# Patient Record
Sex: Male | Born: 1935 | Race: White | Hispanic: No | Marital: Married | Smoking: Former smoker
Health system: Southern US, Community
[De-identification: ages and names within clinical notes are randomized; demographics above are authoritative.]

## PROBLEM LIST (undated history)

## (undated) DIAGNOSIS — I472 Ventricular tachycardia, unspecified: Secondary | ICD-10-CM

## (undated) DIAGNOSIS — R634 Abnormal weight loss: Secondary | ICD-10-CM

## (undated) DIAGNOSIS — I255 Ischemic cardiomyopathy: Secondary | ICD-10-CM

## (undated) DIAGNOSIS — I5022 Chronic systolic (congestive) heart failure: Secondary | ICD-10-CM

## (undated) DIAGNOSIS — I951 Orthostatic hypotension: Secondary | ICD-10-CM

## (undated) DIAGNOSIS — I38 Endocarditis, valve unspecified: Secondary | ICD-10-CM

## (undated) DIAGNOSIS — I4891 Unspecified atrial fibrillation: Secondary | ICD-10-CM

## (undated) DIAGNOSIS — J449 Chronic obstructive pulmonary disease, unspecified: Secondary | ICD-10-CM

## (undated) DIAGNOSIS — I10 Essential (primary) hypertension: Secondary | ICD-10-CM

## (undated) DIAGNOSIS — I739 Peripheral vascular disease, unspecified: Secondary | ICD-10-CM

## (undated) DIAGNOSIS — I2510 Atherosclerotic heart disease of native coronary artery without angina pectoris: Secondary | ICD-10-CM

## (undated) DIAGNOSIS — J4489 Other specified chronic obstructive pulmonary disease: Secondary | ICD-10-CM

## (undated) DIAGNOSIS — R55 Syncope and collapse: Secondary | ICD-10-CM

## (undated) DIAGNOSIS — D649 Anemia, unspecified: Secondary | ICD-10-CM

## (undated) DIAGNOSIS — I779 Disorder of arteries and arterioles, unspecified: Secondary | ICD-10-CM

## (undated) DIAGNOSIS — E785 Hyperlipidemia, unspecified: Secondary | ICD-10-CM

## (undated) DIAGNOSIS — T84498A Other mechanical complication of other internal orthopedic devices, implants and grafts, initial encounter: Secondary | ICD-10-CM

## (undated) DIAGNOSIS — I679 Cerebrovascular disease, unspecified: Secondary | ICD-10-CM

## (undated) DIAGNOSIS — J189 Pneumonia, unspecified organism: Secondary | ICD-10-CM

## (undated) HISTORY — DX: Syncope and collapse: R55

## (undated) HISTORY — DX: Hyperlipidemia, unspecified: E78.5

## (undated) HISTORY — DX: Unspecified atrial fibrillation: I48.91

## (undated) HISTORY — DX: Other mechanical complication of other internal orthopedic devices, implants and grafts, initial encounter: T84.498A

## (undated) HISTORY — DX: Other specified chronic obstructive pulmonary disease: J44.89

## (undated) HISTORY — PX: OTHER SURGICAL HISTORY: SHX169

## (undated) HISTORY — DX: Peripheral vascular disease, unspecified: I73.9

## (undated) HISTORY — DX: Orthostatic hypotension: I95.1

## (undated) HISTORY — DX: Chronic obstructive pulmonary disease, unspecified: J44.9

## (undated) HISTORY — DX: Endocarditis, valve unspecified: I38

## (undated) HISTORY — DX: Essential (primary) hypertension: I10

## (undated) HISTORY — DX: Cerebrovascular disease, unspecified: I67.9

## (undated) HISTORY — DX: Atherosclerotic heart disease of native coronary artery without angina pectoris: I25.10

## (undated) HISTORY — DX: Disorder of arteries and arterioles, unspecified: I77.9

---

## 2003-04-01 ENCOUNTER — Ambulatory Visit (HOSPITAL_COMMUNITY): Admission: RE | Admit: 2003-04-01 | Discharge: 2003-04-01 | Payer: Self-pay | Admitting: Internal Medicine

## 2005-06-26 ENCOUNTER — Ambulatory Visit: Payer: Self-pay | Admitting: Internal Medicine

## 2005-07-04 ENCOUNTER — Ambulatory Visit: Payer: Self-pay | Admitting: Internal Medicine

## 2005-07-04 ENCOUNTER — Ambulatory Visit (HOSPITAL_COMMUNITY): Admission: RE | Admit: 2005-07-04 | Discharge: 2005-07-04 | Payer: Self-pay | Admitting: Internal Medicine

## 2005-08-15 ENCOUNTER — Ambulatory Visit: Payer: Self-pay | Admitting: Internal Medicine

## 2006-03-08 ENCOUNTER — Ambulatory Visit: Payer: Self-pay | Admitting: Internal Medicine

## 2006-03-26 ENCOUNTER — Ambulatory Visit: Payer: Self-pay | Admitting: Cardiology

## 2006-04-01 ENCOUNTER — Ambulatory Visit: Payer: Self-pay | Admitting: Cardiology

## 2006-04-03 ENCOUNTER — Ambulatory Visit: Payer: Self-pay | Admitting: Cardiovascular Disease

## 2006-04-03 ENCOUNTER — Inpatient Hospital Stay (HOSPITAL_COMMUNITY): Admission: AD | Admit: 2006-04-03 | Discharge: 2006-04-09 | Payer: Self-pay | Admitting: Cardiovascular Disease

## 2006-04-03 ENCOUNTER — Inpatient Hospital Stay (HOSPITAL_BASED_OUTPATIENT_CLINIC_OR_DEPARTMENT_OTHER): Admission: RE | Admit: 2006-04-03 | Discharge: 2006-04-03 | Payer: Self-pay | Admitting: Cardiovascular Disease

## 2006-04-04 ENCOUNTER — Encounter (INDEPENDENT_AMBULATORY_CARE_PROVIDER_SITE_OTHER): Payer: Self-pay | Admitting: *Deleted

## 2006-04-04 HISTORY — PX: CORONARY ARTERY BYPASS GRAFT: SHX141

## 2006-04-29 ENCOUNTER — Ambulatory Visit: Payer: Self-pay | Admitting: Cardiology

## 2006-05-31 ENCOUNTER — Ambulatory Visit: Payer: Self-pay | Admitting: Cardiology

## 2006-06-10 ENCOUNTER — Ambulatory Visit (HOSPITAL_COMMUNITY): Admission: RE | Admit: 2006-06-10 | Discharge: 2006-06-10 | Payer: Self-pay | Admitting: Internal Medicine

## 2006-09-18 ENCOUNTER — Encounter: Payer: Self-pay | Admitting: Cardiology

## 2006-09-19 ENCOUNTER — Ambulatory Visit: Payer: Self-pay | Admitting: Cardiology

## 2007-01-02 ENCOUNTER — Ambulatory Visit: Payer: Self-pay | Admitting: Cardiology

## 2007-01-15 ENCOUNTER — Ambulatory Visit: Payer: Self-pay | Admitting: Cardiology

## 2007-01-24 ENCOUNTER — Ambulatory Visit: Payer: Self-pay | Admitting: Internal Medicine

## 2007-02-17 ENCOUNTER — Ambulatory Visit: Payer: Self-pay | Admitting: Internal Medicine

## 2007-02-17 ENCOUNTER — Encounter: Payer: Self-pay | Admitting: Cardiology

## 2007-02-17 ENCOUNTER — Encounter: Payer: Self-pay | Admitting: Internal Medicine

## 2007-02-17 ENCOUNTER — Ambulatory Visit (HOSPITAL_COMMUNITY): Admission: RE | Admit: 2007-02-17 | Discharge: 2007-02-17 | Payer: Self-pay | Admitting: Internal Medicine

## 2007-05-26 ENCOUNTER — Encounter: Payer: Self-pay | Admitting: Cardiology

## 2007-06-06 ENCOUNTER — Ambulatory Visit: Payer: Self-pay | Admitting: Cardiology

## 2007-06-11 ENCOUNTER — Ambulatory Visit: Payer: Self-pay | Admitting: Internal Medicine

## 2007-06-11 ENCOUNTER — Ambulatory Visit: Payer: Self-pay

## 2007-06-11 ENCOUNTER — Encounter: Payer: Self-pay | Admitting: Cardiology

## 2007-06-11 LAB — CONVERTED CEMR LAB
BUN: 15 mg/dL (ref 6–23)
Basophils Absolute: 0.4 10*3/uL — ABNORMAL HIGH (ref 0.0–0.1)
Basophils Relative: 5.7 % — ABNORMAL HIGH (ref 0.0–1.0)
CO2: 29 meq/L (ref 19–32)
Calcium: 9.2 mg/dL (ref 8.4–10.5)
Chloride: 107 meq/L (ref 96–112)
Creatinine, Ser: 1.1 mg/dL (ref 0.4–1.5)
Eosinophils Absolute: 0.3 10*3/uL (ref 0.0–0.6)
Eosinophils Relative: 4.3 % (ref 0.0–5.0)
GFR calc Af Amer: 84 mL/min
GFR calc non Af Amer: 69 mL/min
Glucose, Bld: 142 mg/dL — ABNORMAL HIGH (ref 70–99)
HCT: 37.4 % — ABNORMAL LOW (ref 39.0–52.0)
Hemoglobin: 13 g/dL (ref 13.0–17.0)
INR: 0.8 (ref 0.8–1.0)
Lymphocytes Relative: 30.2 % (ref 12.0–46.0)
MCHC: 34.9 g/dL (ref 30.0–36.0)
MCV: 84.8 fL (ref 78.0–100.0)
Monocytes Absolute: 0.5 10*3/uL (ref 0.2–0.7)
Monocytes Relative: 7.3 % (ref 3.0–11.0)
Neutro Abs: 4 10*3/uL (ref 1.4–7.7)
Neutrophils Relative %: 52.5 % (ref 43.0–77.0)
Platelets: 177 10*3/uL (ref 150–400)
Potassium: 3.5 meq/L (ref 3.5–5.1)
Prothrombin Time: 10.9 s (ref 10.9–13.3)
RBC: 4.41 M/uL (ref 4.22–5.81)
RDW: 13.3 % (ref 11.5–14.6)
Sodium: 143 meq/L (ref 135–145)
WBC: 7.5 10*3/uL (ref 4.5–10.5)
aPTT: 28.3 s (ref 21.7–29.8)

## 2007-06-18 ENCOUNTER — Observation Stay (HOSPITAL_COMMUNITY): Admission: AD | Admit: 2007-06-18 | Discharge: 2007-06-19 | Payer: Self-pay | Admitting: Internal Medicine

## 2007-06-18 ENCOUNTER — Ambulatory Visit: Payer: Self-pay | Admitting: Internal Medicine

## 2007-06-18 HISTORY — PX: OTHER SURGICAL HISTORY: SHX169

## 2007-06-30 ENCOUNTER — Ambulatory Visit: Payer: Self-pay

## 2007-07-07 ENCOUNTER — Ambulatory Visit (HOSPITAL_COMMUNITY): Admission: RE | Admit: 2007-07-07 | Discharge: 2007-07-07 | Payer: Self-pay | Admitting: Internal Medicine

## 2007-09-16 ENCOUNTER — Ambulatory Visit: Payer: Self-pay | Admitting: Internal Medicine

## 2007-12-25 ENCOUNTER — Ambulatory Visit: Payer: Self-pay | Admitting: Cardiovascular Disease

## 2008-03-25 ENCOUNTER — Ambulatory Visit: Payer: Self-pay | Admitting: Cardiology

## 2008-04-19 ENCOUNTER — Ambulatory Visit: Payer: Self-pay | Admitting: Cardiology

## 2008-04-23 ENCOUNTER — Encounter: Payer: Self-pay | Admitting: Cardiology

## 2008-06-09 ENCOUNTER — Ambulatory Visit: Payer: Self-pay | Admitting: Internal Medicine

## 2008-10-07 ENCOUNTER — Encounter: Payer: Self-pay | Admitting: Internal Medicine

## 2008-10-14 ENCOUNTER — Ambulatory Visit: Payer: Self-pay | Admitting: Cardiology

## 2009-01-14 ENCOUNTER — Encounter: Payer: Self-pay | Admitting: Internal Medicine

## 2009-01-14 ENCOUNTER — Ambulatory Visit: Payer: Self-pay | Admitting: Cardiology

## 2009-02-22 DIAGNOSIS — I2510 Atherosclerotic heart disease of native coronary artery without angina pectoris: Secondary | ICD-10-CM | POA: Insufficient documentation

## 2009-02-22 DIAGNOSIS — I10 Essential (primary) hypertension: Secondary | ICD-10-CM | POA: Insufficient documentation

## 2009-02-22 DIAGNOSIS — E785 Hyperlipidemia, unspecified: Secondary | ICD-10-CM | POA: Insufficient documentation

## 2009-02-22 DIAGNOSIS — I2589 Other forms of chronic ischemic heart disease: Secondary | ICD-10-CM

## 2009-04-15 ENCOUNTER — Ambulatory Visit: Payer: Self-pay

## 2009-04-15 ENCOUNTER — Encounter: Payer: Self-pay | Admitting: Internal Medicine

## 2009-05-19 ENCOUNTER — Encounter: Payer: Self-pay | Admitting: Cardiology

## 2009-06-20 ENCOUNTER — Encounter: Payer: Self-pay | Admitting: Cardiology

## 2009-06-20 ENCOUNTER — Ambulatory Visit: Payer: Self-pay | Admitting: Cardiology

## 2009-06-20 ENCOUNTER — Encounter: Payer: Self-pay | Admitting: Physician Assistant

## 2009-06-21 ENCOUNTER — Encounter: Payer: Self-pay | Admitting: Cardiology

## 2009-06-22 ENCOUNTER — Encounter: Payer: Self-pay | Admitting: Cardiology

## 2009-06-24 ENCOUNTER — Encounter: Payer: Self-pay | Admitting: Cardiology

## 2009-07-06 ENCOUNTER — Encounter: Payer: Self-pay | Admitting: Cardiology

## 2009-07-11 ENCOUNTER — Ambulatory Visit: Payer: Self-pay | Admitting: Cardiology

## 2009-07-11 DIAGNOSIS — E119 Type 2 diabetes mellitus without complications: Secondary | ICD-10-CM

## 2009-07-11 DIAGNOSIS — I679 Cerebrovascular disease, unspecified: Secondary | ICD-10-CM

## 2009-07-11 DIAGNOSIS — Z95810 Presence of automatic (implantable) cardiac defibrillator: Secondary | ICD-10-CM | POA: Insufficient documentation

## 2009-07-11 DIAGNOSIS — I509 Heart failure, unspecified: Secondary | ICD-10-CM | POA: Insufficient documentation

## 2009-08-15 ENCOUNTER — Ambulatory Visit: Payer: Self-pay | Admitting: Surgery

## 2009-08-15 ENCOUNTER — Encounter: Payer: Self-pay | Admitting: Cardiology

## 2009-08-23 ENCOUNTER — Ambulatory Visit: Payer: Self-pay | Admitting: Cardiology

## 2009-08-23 ENCOUNTER — Inpatient Hospital Stay (HOSPITAL_COMMUNITY): Admission: EM | Admit: 2009-08-23 | Discharge: 2009-08-27 | Payer: Self-pay | Admitting: Emergency Medicine

## 2009-08-23 ENCOUNTER — Ambulatory Visit: Payer: Self-pay | Admitting: Orthopedic Surgery

## 2009-08-23 ENCOUNTER — Encounter: Payer: Self-pay | Admitting: Cardiology

## 2009-08-24 ENCOUNTER — Encounter: Payer: Self-pay | Admitting: Cardiology

## 2009-08-24 ENCOUNTER — Encounter: Payer: Self-pay | Admitting: Orthopedic Surgery

## 2009-08-25 ENCOUNTER — Encounter: Payer: Self-pay | Admitting: Cardiology

## 2009-08-26 ENCOUNTER — Encounter: Payer: Self-pay | Admitting: Orthopedic Surgery

## 2009-08-28 ENCOUNTER — Encounter: Payer: Self-pay | Admitting: Orthopedic Surgery

## 2009-08-31 ENCOUNTER — Ambulatory Visit: Payer: Self-pay | Admitting: Internal Medicine

## 2009-09-01 ENCOUNTER — Telehealth: Payer: Self-pay | Admitting: Orthopedic Surgery

## 2009-09-01 ENCOUNTER — Encounter: Payer: Self-pay | Admitting: Orthopedic Surgery

## 2009-09-02 ENCOUNTER — Telehealth: Payer: Self-pay | Admitting: Orthopedic Surgery

## 2009-09-05 ENCOUNTER — Ambulatory Visit: Payer: Self-pay | Admitting: Orthopedic Surgery

## 2009-09-05 DIAGNOSIS — S82009A Unspecified fracture of unspecified patella, initial encounter for closed fracture: Secondary | ICD-10-CM

## 2009-09-07 ENCOUNTER — Encounter: Payer: Self-pay | Admitting: Orthopedic Surgery

## 2009-09-09 ENCOUNTER — Telehealth: Payer: Self-pay | Admitting: Orthopedic Surgery

## 2009-09-12 ENCOUNTER — Ambulatory Visit: Payer: Self-pay | Admitting: Orthopedic Surgery

## 2009-09-12 DIAGNOSIS — T84498A Other mechanical complication of other internal orthopedic devices, implants and grafts, initial encounter: Secondary | ICD-10-CM

## 2009-09-13 ENCOUNTER — Ambulatory Visit: Payer: Self-pay | Admitting: Cardiology

## 2009-09-13 ENCOUNTER — Telehealth: Payer: Self-pay | Admitting: Orthopedic Surgery

## 2009-09-14 ENCOUNTER — Encounter: Payer: Self-pay | Admitting: Orthopedic Surgery

## 2009-09-15 ENCOUNTER — Encounter: Payer: Self-pay | Admitting: Orthopedic Surgery

## 2009-09-16 ENCOUNTER — Inpatient Hospital Stay (HOSPITAL_COMMUNITY): Admission: RE | Admit: 2009-09-16 | Discharge: 2009-09-19 | Payer: Self-pay | Admitting: Orthopedic Surgery

## 2009-09-16 ENCOUNTER — Ambulatory Visit: Payer: Self-pay | Admitting: Orthopedic Surgery

## 2009-09-19 ENCOUNTER — Encounter: Payer: Self-pay | Admitting: Orthopedic Surgery

## 2009-09-21 ENCOUNTER — Telehealth: Payer: Self-pay | Admitting: Orthopedic Surgery

## 2009-09-26 ENCOUNTER — Ambulatory Visit: Payer: Self-pay | Admitting: Orthopedic Surgery

## 2009-10-06 ENCOUNTER — Ambulatory Visit: Payer: Self-pay | Admitting: Orthopedic Surgery

## 2009-10-06 ENCOUNTER — Telehealth: Payer: Self-pay | Admitting: Orthopedic Surgery

## 2009-10-07 ENCOUNTER — Encounter: Payer: Self-pay | Admitting: Orthopedic Surgery

## 2009-10-10 ENCOUNTER — Encounter: Payer: Self-pay | Admitting: Orthopedic Surgery

## 2009-11-02 ENCOUNTER — Ambulatory Visit: Payer: Self-pay | Admitting: Orthopedic Surgery

## 2009-11-16 ENCOUNTER — Encounter: Payer: Self-pay | Admitting: Orthopedic Surgery

## 2009-11-16 ENCOUNTER — Telehealth: Payer: Self-pay | Admitting: Orthopedic Surgery

## 2009-11-17 ENCOUNTER — Telehealth (INDEPENDENT_AMBULATORY_CARE_PROVIDER_SITE_OTHER): Payer: Self-pay | Admitting: *Deleted

## 2009-11-23 ENCOUNTER — Encounter: Payer: Self-pay | Admitting: Orthopedic Surgery

## 2009-11-23 ENCOUNTER — Encounter (HOSPITAL_COMMUNITY): Admission: RE | Admit: 2009-11-23 | Discharge: 2009-12-23 | Payer: Self-pay | Admitting: Orthopedic Surgery

## 2009-11-30 ENCOUNTER — Ambulatory Visit: Payer: Self-pay | Admitting: Cardiology

## 2009-11-30 ENCOUNTER — Encounter: Payer: Self-pay | Admitting: Internal Medicine

## 2009-12-14 ENCOUNTER — Ambulatory Visit: Payer: Self-pay | Admitting: Orthopedic Surgery

## 2010-02-14 ENCOUNTER — Ambulatory Visit: Payer: Self-pay | Admitting: Surgery

## 2010-02-21 ENCOUNTER — Ambulatory Visit: Payer: Self-pay | Admitting: Cardiology

## 2010-02-21 ENCOUNTER — Encounter: Payer: Self-pay | Admitting: Internal Medicine

## 2010-02-28 ENCOUNTER — Ambulatory Visit: Payer: Self-pay | Admitting: Cardiology

## 2010-02-28 ENCOUNTER — Encounter: Payer: Self-pay | Admitting: Cardiology

## 2010-02-28 ENCOUNTER — Inpatient Hospital Stay (HOSPITAL_COMMUNITY): Admission: AD | Admit: 2010-02-28 | Discharge: 2010-03-03 | Payer: Self-pay | Admitting: Cardiology

## 2010-03-02 ENCOUNTER — Encounter: Payer: Self-pay | Admitting: Cardiology

## 2010-03-03 ENCOUNTER — Encounter: Payer: Self-pay | Admitting: Cardiology

## 2010-03-07 ENCOUNTER — Encounter: Payer: Self-pay | Admitting: Cardiology

## 2010-03-09 ENCOUNTER — Ambulatory Visit: Payer: Self-pay | Admitting: Cardiology

## 2010-03-09 ENCOUNTER — Encounter: Payer: Self-pay | Admitting: Cardiology

## 2010-03-15 ENCOUNTER — Ambulatory Visit: Payer: Self-pay | Admitting: Physician Assistant

## 2010-03-15 DIAGNOSIS — R0989 Other specified symptoms and signs involving the circulatory and respiratory systems: Secondary | ICD-10-CM

## 2010-03-15 DIAGNOSIS — I5022 Chronic systolic (congestive) heart failure: Secondary | ICD-10-CM

## 2010-03-17 ENCOUNTER — Encounter: Payer: Self-pay | Admitting: Physician Assistant

## 2010-03-21 ENCOUNTER — Encounter: Payer: Self-pay | Admitting: Cardiology

## 2010-03-29 ENCOUNTER — Encounter (INDEPENDENT_AMBULATORY_CARE_PROVIDER_SITE_OTHER): Payer: Self-pay | Admitting: *Deleted

## 2010-04-05 ENCOUNTER — Encounter: Payer: Self-pay | Admitting: Cardiology

## 2010-04-05 ENCOUNTER — Ambulatory Visit: Payer: Self-pay | Admitting: Cardiovascular Disease

## 2010-04-05 ENCOUNTER — Ambulatory Visit (HOSPITAL_COMMUNITY): Admission: RE | Admit: 2010-04-05 | Discharge: 2010-04-05 | Payer: Self-pay | Admitting: Cardiology

## 2010-04-07 ENCOUNTER — Encounter: Payer: Self-pay | Admitting: Internal Medicine

## 2010-04-07 ENCOUNTER — Ambulatory Visit: Payer: Self-pay | Admitting: Internal Medicine

## 2010-04-07 DIAGNOSIS — I472 Ventricular tachycardia: Secondary | ICD-10-CM

## 2010-04-27 ENCOUNTER — Ambulatory Visit: Payer: Self-pay | Admitting: Cardiology

## 2010-04-27 DIAGNOSIS — R5383 Other fatigue: Secondary | ICD-10-CM

## 2010-04-27 DIAGNOSIS — R5381 Other malaise: Secondary | ICD-10-CM

## 2010-05-14 ENCOUNTER — Ambulatory Visit: Payer: Self-pay | Admitting: Internal Medicine

## 2010-05-14 ENCOUNTER — Encounter: Payer: Self-pay | Admitting: Cardiology

## 2010-05-14 ENCOUNTER — Encounter (INDEPENDENT_AMBULATORY_CARE_PROVIDER_SITE_OTHER): Payer: Self-pay | Admitting: Critical Care Medicine

## 2010-05-15 ENCOUNTER — Telehealth (INDEPENDENT_AMBULATORY_CARE_PROVIDER_SITE_OTHER): Payer: Self-pay | Admitting: *Deleted

## 2010-05-17 ENCOUNTER — Ambulatory Visit: Payer: Self-pay | Admitting: Cardiology

## 2010-05-22 ENCOUNTER — Telehealth (INDEPENDENT_AMBULATORY_CARE_PROVIDER_SITE_OTHER): Payer: Self-pay | Admitting: *Deleted

## 2010-05-31 ENCOUNTER — Telehealth (INDEPENDENT_AMBULATORY_CARE_PROVIDER_SITE_OTHER): Payer: Self-pay | Admitting: *Deleted

## 2010-06-07 ENCOUNTER — Ambulatory Visit: Payer: Self-pay | Admitting: Physician Assistant

## 2010-06-09 ENCOUNTER — Encounter: Payer: Self-pay | Admitting: Physician Assistant

## 2010-06-09 ENCOUNTER — Ambulatory Visit: Payer: Self-pay | Admitting: Cardiology

## 2010-06-14 ENCOUNTER — Encounter (INDEPENDENT_AMBULATORY_CARE_PROVIDER_SITE_OTHER): Payer: Self-pay | Admitting: *Deleted

## 2010-06-19 ENCOUNTER — Telehealth: Payer: Self-pay | Admitting: Internal Medicine

## 2010-06-23 ENCOUNTER — Ambulatory Visit: Payer: Self-pay | Admitting: Internal Medicine

## 2010-08-14 ENCOUNTER — Ambulatory Visit
Admission: RE | Admit: 2010-08-14 | Discharge: 2010-08-14 | Payer: Self-pay | Source: Home / Self Care | Attending: Surgery | Admitting: Surgery

## 2010-08-28 ENCOUNTER — Encounter: Payer: Self-pay | Admitting: Internal Medicine

## 2010-08-28 ENCOUNTER — Ambulatory Visit
Admission: RE | Admit: 2010-08-28 | Discharge: 2010-08-28 | Payer: Self-pay | Source: Home / Self Care | Attending: Internal Medicine | Admitting: Internal Medicine

## 2010-09-05 NOTE — Procedures (Signed)
Summary: 3 mth f/u per checkout on 08/31/09/tg   Current Medications (verified): 1)  Simvastatin 40 Mg Tabs (Simvastatin) .... One By Mouth Daily 2)  Aspirin 325 Mg Tabs (Aspirin) .... One By Mouth Daily 3)  Diovan 80 Mg Tabs (Valsartan) .... 1/2 By Mouth Two Times A Day 4)  Carvedilol 6.25 Mg Tabs (Carvedilol) .... Take 1 Tablet By Mouth Two Times A Day 5)  Furosemide 40 Mg Tabs (Furosemide) .... Take One Tablet By Mouth Two Times A Day 6)  Ventolin Hfa 108 (90 Base) Mcg/act Aers (Albuterol Sulfate) .... 2 Puffs Every 6 Hours 7)  Glipizide Xl 2.5 Mg Xr24h-Tab (Glipizide) .... One By Mouth Daily 8)  Potassium Chloride Cr 10 Meq Cr-Caps (Potassium Chloride) .... Take 1 Tablet By Mouth Once A Day 9)  Loratadine 10 Mg Tabs (Loratadine) .... Take 1 Tablet By Mouth Once A Day 10)  Ra Acid Reducer Max St 150 Mg Tabs (Ranitidine Hcl) .... Take 1 Tablet By Mouth Once A Day 11)  Nitrostat 0.4 Mg Subl (Nitroglycerin) .... Use As Directed  Allergies (verified): No Known Drug Allergies    ICD Specifications Following MD:  Lewayne Bunting, MD     ICD Vendor:  St Jude     ICD Model Number:  [id number]     ICD Serial Number:  [id number] ICD DOI:  06/18/2007      Lead 1:    Location: RA     DOI: 06/18/2007     Model #: [id number]     Serial #: [id number]     Status: active Lead 2:    Location: RV     DOI: 06/18/2007     Model #: [id number]     Serial #: [id number]     Status: active  Indications::  ICM   ICD Follow Up Remote Check?  No Battery Voltage:  3.13 V     Charge Time:  10.8 seconds     Battery Est. Longevity:  5 years Underlying rhythm:  SR@68  ICD Dependent:  No       ICD Device Measurements Atrium:  Amplitude: 3.7 mV, Impedance: 360 ohms, Threshold: 1.0 V at 0.5 msec Right Ventricle:  Amplitude: 11.7 mV, Impedance: 450 ohms, Threshold: 1.0 V at 0.5 msec Shock Impedance: 54 ohms   Episodes MS Episodes:  5     Percent Mode Switch:  <1%     Coumadin:  No Shock:  1     ATP:  3     Nonsustained:  0     ICD  Appropriate Therapy?  Yes  Brady Parameters Mode DDDR     Lower Rate Limit:  60     Upper Rate Limit 105 PAV 275     Sensed AV Delay:  275  Tachy Zones VF:  240     VT:  200     VT1:  176     Next Cardiology Appt Due:  02/03/2010 Tech Comments:  No parameter changes.  Device function normal.  Rate response somewhat blunted but adequate for the patient's level of activity.  Mr.Eppes fell on the ice during the winter and required knee surgery.  He is now taking PT but continues to use a brace and cane.  He also vaguely remembers being shocked back in March.  ATP therapy x2 unsuccessful.  ROV 3 months in the [facility] clinic. Altha Harm, LPN  November 30, 2009 10:42 AM  MD Comments:  Agree with above.

## 2010-09-05 NOTE — Cardiovascular Report (Signed)
Summary: Office Visit   Office Visit   Imported By: Roderic Ovens 06/26/2010 15:52:28  _____________________________________________________________________  External Attachment:    Type:   Image     Comment:   External Document

## 2010-09-05 NOTE — Letter (Signed)
Summary: Revised Out of Work Note for [facility]  [address]  [place], [place] [address]   Phone: [phone]  Fax: [phone]    September 14, 2009   Employee:  Emmit Pomfret Patient:     [name]    To Whom It May Concern:   For Medical reasons, please excuse the above named employee from work for the following dates:  Start:   Friday, 09/09/2009  End:   Friday, 11/18/2009  Per FMLA paperwork, employee may be excused from work for hospital and office appointments.   If you need additional information, please feel free to contact our office.         Sincerely,    Terrance Mass, MD

## 2010-09-05 NOTE — Progress Notes (Signed)
Summary: wait on PT?  Phone Note Other Incoming   Summary of Call: [facility] says [name] (07/09/1929) says he is not supposed to start PT for 2 weeks.  We do not have discharge summary. Stacy's # [phone]  ok to leave message. Initial call taken by: Jacklynn Ganong,  September 21, 2009 9:39 AM  Follow-up for Phone Call        physical therapy for gait training only no knee motion exercises no strengthening exercises for the knee Follow-up by: Ether Griffins,  September 21, 2009 9:46 AM  Additional Follow-up for Phone Call Additional follow up Details #1::        Left a message on Stacy's phone to wait on pt Additional Follow-up by: Jacklynn Ganong,  September 21, 2009 10:56 AM

## 2010-09-05 NOTE — Cardiovascular Report (Signed)
Summary: Office Visit   Office Visit   Imported By: Roderic Ovens 03/07/2010 15:50:22  _____________________________________________________________________  External Attachment:    Type:   Image     Comment:   External Document

## 2010-09-05 NOTE — Progress Notes (Signed)
Summary: can patient do home exercises or OP therapy  Phone Note Call from Patient Call back at Home Phone [phone]   Summary of Call: says he cannot do the home therapy due to insurance,  will not cover due to patient not being home bound, can patient drive himself to therapy or are there any exercises that we can give him to do himself!? Initial call taken by: Ether Griffins,  November 16, 2009 3:59 PM  Follow-up for Phone Call        ok to drive to PT  order out patient PT Follow-up by: Fuller Canada MD,  November 17, 2009 8:27 AM  Additional Follow-up for Phone Call Additional follow up Details #1::        ok faxed order to [facility] and advised patient Additional Follow-up by: Ether Griffins,  November 17, 2009 9:40 AM

## 2010-09-05 NOTE — Miscellaneous (Signed)
Summary: Orders Update  Clinical Lists Changes  Orders: Added new Referral order of 2-D Echocardiogram (2D Echo) - Signed 

## 2010-09-05 NOTE — Progress Notes (Signed)
Summary: [facility] to [facility] to d/c  ---- Converted from flag ---- ---- 10/06/2009 11:28 AM, Fuller Canada MD wrote: call [facility] stop nurse from coming to the house ------------------------------  Phone Note Outgoing Call   Call placed to: Home health Summary of Call: [facility], per Rayle, [phone].  Asked about physical therapy - discontinue also? Initial call taken by: Cammie Sickle,  October 06, 2009 11:48 AM

## 2010-09-05 NOTE — Assessment & Plan Note (Signed)
Summary: d/c amiodarone, restart sotalol per gd  --agh   Visit Type:  Follow-up Primary Provider:  Dr. Wyvonnia Lora   History of Present Illness: patient is seen today as an add-on.  He was recently seen here in the clinic, 10/12, by Dr. Andee Lineman, following recent hospitalization here at [facility] for recurrent ICD shock. He was taken off sotalol, and started on amiodarone load. Dr. Andee Lineman noted that he had had an appropriate shock from his ICD.  Shortly after this, the patient contacted our office complaining of lower extremity weakness and increased dyspnea. He felt that this was due to the amiodarone, and dose was decreased to 200 mg daily, per Dr. Andee Lineman. However, he continued to experience symptoms, and the medication was ultimately discontinued this past Monday, 10/31. He was advised to return to the clinic today to discuss resumption of sotalol.  Clinically, the patient notes improvement in his symptoms, since stopping the medication. He is no longer short of breath, and his gait and mobility has improved.  Preventive Screening-Counseling & Management  Alcohol-Tobacco     Smoking Status: quit     Year Quit: 1992  Current Medications (verified): 1)  Simvastatin 40 Mg Tabs (Simvastatin) .... One By Mouth Daily 2)  Aspirin 325 Mg Tabs (Aspirin) .... Take 1 Tablet By Mouth Once A Day 3)  Diovan 80 Mg Tabs (Valsartan) .... 1/2 By Mouth Daily 4)  Furosemide 40 Mg Tabs (Furosemide) .... Take 1 Tablet By Mouth Once A Day 5)  Ventolin Hfa 108 (90 Base) Mcg/act Aers (Albuterol Sulfate) .... 2 Puffs Every 6 Hours 6)  Glipizide Xl 2.5 Mg Xr24h-Tab (Glipizide) .... One By Mouth Daily 7)  Potassium Chloride Cr 10 Meq Cr-Caps (Potassium Chloride) .... Take 1 Tablet By Mouth Once A Day 8)  Nitrostat 0.4 Mg Subl (Nitroglycerin) .... Use As Directed 9)  Prilosec Otc 20 Mg Tbec (Omeprazole Magnesium) .... One By Mouth Daily 10)  Zyrtec Allergy 10 Mg Tabs (Cetirizine Hcl) .... Take 1 Tablet By Mouth  Twice A Day 11)  Metoprolol Succinate 25 Mg Xr24h-Tab (Metoprolol Succinate) .... Take One Tablet By Mouth Daily  Allergies (verified): No Known Drug Allergies  Comments:  Nurse/Medical Assistant: The patient's medication list and allergies were reviewed with the patient and were updated in the Medication and Allergy Lists.  Past History:  Past Medical History: Last updated: 05/17/2010 Current Problems:  ISCHEMIC CARDIOMYOPATHY (ICD-414.8) Cardiac catheterization July 20113/3 grafts patent. CAD (ICD-414.00) DYSLIPIDEMIA (ICD-272.4) HYPERTENSION (ICD-401.9) Diabetes mellitus COPD History of ICD  sotalol discontinued and started on amiodarone Carotid artery disease... status post left CEA Valvular heart disease 2+ mitral regurgitation Chronic systolic heart failure ejection fraction 45-50% August 2011 History of postop atrial fibrillation History of LV thrombus resolved  Review of Systems       No fevers, chills, hemoptysis, dysphagia, melena, hematocheezia, hematuria, rash, claudication, orthopnea, pnd, pedal edema. All other systems negative.   Vital Signs:  Patient profile:   77 year old male Height:      69 inches Weight:      170 pounds Pulse rate:   60 / minute BP sitting:   134 / 79  (left arm) Cuff size:   regular  Vitals Entered By: Carlye Grippe (June 07, 2010 10:31 AM)  Physical Exam  Additional Exam:  GEN: 77 year old male, sitting upright, no distress HEENT: NCAT,PERRLA,EOMI NECK: palpable pulses, no bruits; no JVD; no TM LUNGS: CTA bilaterally HEART: RRR (S1S2); no significant murmurs; no rubs; no gallops ABD: soft,  NT; intact BS EXT: intact distal pulses; no edema SKIN: warm, dry MUSC: no obvious deformity NEURO: A/O (x3)     EKG  Procedure date:  06/07/2010  Findings:      atrial pacing at 60 bpm with first degree AV block   ICD Specifications Following MD:  Lewayne Bunting, MD     ICD Vendor:  St Jude     ICD Model Number:   [id number]     ICD Serial Number:  [id number] ICD DOI:  06/18/2007      Lead 1:    Location: RA     DOI: 06/18/2007     Model #: [id number]     Serial #: [id number]     Status: active Lead 2:    Location: RV     DOI: 06/18/2007     Model #: [id number]     Serial #: [id number]     Status: active  Indications::  ICM   ICD Follow Up ICD Dependent:  No      Episodes Coumadin:  No  Brady Parameters Mode DDDR     Lower Rate Limit:  60     Upper Rate Limit 100 PAV 275     Sensed AV Delay:  275  Tachy Zones VF:  240     VT:  176     VT1:  176     Impression & Recommendations:  Problem # 1:  VENTRICULAR TACHYCARDIA (ICD-427.1)  following discussion with Dr. Andee Lineman, recommendation is to resume sotalol at previous dose of 80 mg b.i.d. Patient has a scheduled appointment with Dr. Johney Frame next month, at which time he can discuss feasibility of possible VT ablation. We will check to make sure that he has a 2-D echo prior to that visit, as previously recommended.  Problem # 2:  CHRONIC SYSTOLIC HEART FAILURE (ICD-428.22)  euvolemic by history and physical examination.  Orders: 2-D Echocardiogram (2D Echo)  Problem # 3:  ISCHEMIC CARDIOMYOPATHY (ICD-414.8)  quiesced and on current medication regimen.  Orders: 2-D Echocardiogram (2D Echo)  Other Orders: EKG w/ Interpretation (93000)  Patient Instructions: 1)  Your physician wants you to follow-up in: 4 months. You will receive a reminder letter in the mail one-two months in advance. If you don't receive a letter, please call our office to schedule the follow-up appointment. 2)  Stop Amiodarone and metoprolol (Toprol XL). 3)  Resume Sotalol 80mg  by mouth two times a day.  4)  Your physician has requested that you have an echocardiogram.  Echocardiography is a painless test that uses sound waves to create images of your heart. It provides your doctor with information about the size and shape of your heart and how well your heart's chambers and valves are  working.  This procedure takes approximately one hour. There are no restrictions for this procedure. If the results of your test are normal or stable, you will receive a letter. If they are abnormal, the nurse will contact you by phone.  5)  APPOINTMENT WITH DR. ALLRED ON MONDAY, August 28, 2009 AT 2PM. Prescriptions: SOTALOL HCL 80 MG TABS (SOTALOL HCL) Take one tablet by mouth twice a day  #60 x 3   Entered by:   Cyril Loosen, RN, BSN   Authorized by:   Nelida Meuse, PA-C   Signed by:   Cyril Loosen, RN, BSN on 06/07/2010   Method used:   Electronically to        [facility]* (retail)  [address]       [place], [place]  [address]       Ph: [phone]       Fax: [phone]   RxID:   [id number]   Handout requested.  I have reviewed and approved all prescriptions at the time of the office visit. Nelida Meuse, PA-C  June 07, 2010 11:27 AM

## 2010-09-05 NOTE — Miscellaneous (Signed)
Summary: Home Care Report  Home Care Report   Imported By: Elvera Maria 09/28/2009 13:35:50  _____________________________________________________________________  External Attachment:    Type:   Image     Comment:   home health

## 2010-09-05 NOTE — Assessment & Plan Note (Signed)
Summary: [facility]   Visit Type:  Follow-up Primary Provider:  Dr. Wyvonnia Lora   History of Present Illness: patient presents for post hospital followup.  He was recently briefly hospitalized at [facility], after presenting with ICD shock. Device interrogation yielded MMVT, successfully converted 2 normal sinus rhythm after one shock. Our electrophysiology team was consulted, and recommended adding sotalol 80 mg b.i.d. QT interval remained stable, and patient was discharged on hospital day #3.  Ischemic workup also ensued, following minimal troponin elevation of 0.09, with normal CPK/MBs. Cardiac catheterization indicated severe multivessel CAD with patent bypass grafts; EF 30-35% with 2+ mitral regurgitation.  A 2D echocardiogram was also obtained, indicating EF 25-30% with apical akinesis and question of possible apical thrombus. There was moderate MR. Of note, recommendation was for an outpatient followup 2-D echo with Definity, for further clarification possible thrombus.  A 2-D echo was done here at [facility], on August 4, and reviewed by Dr. Jens Som, indicating EF 30-35%, with mild MR, mild AS, and mild AR. Of note, there was no suggestion of LV thrombus; however, it is not clear that this was performed with Definity contrast.  Clinically, the patient denies any chest pain, dyspnea, tachycardia palpitations, or presyncope/syncope. He denies any complications of the right groin incision site.  Preventive Screening-Counseling & Management  Alcohol-Tobacco     Smoking Status: quit     Year Quit: 1992  Current Medications (verified): 1)  Simvastatin 40 Mg Tabs (Simvastatin) .... One By Mouth Daily 2)  Aspirin 325 Mg Tabs (Aspirin) .... One By Mouth Daily 3)  Diovan 80 Mg Tabs (Valsartan) .... 1/2 By Mouth Daily 4)  Carvedilol 6.25 Mg Tabs (Carvedilol) .... Take 1 Tablet By Mouth Two Times A Day 5)  Furosemide 40 Mg Tabs (Furosemide) .... Take 1 Tablet By Mouth Once A  Day 6)  Ventolin Hfa 108 (90 Base) Mcg/act Aers (Albuterol Sulfate) .... 2 Puffs Every 6 Hours 7)  Glipizide Xl 2.5 Mg Xr24h-Tab (Glipizide) .... One By Mouth Daily 8)  Potassium Chloride Cr 10 Meq Cr-Caps (Potassium Chloride) .... Take 1 Tablet By Mouth Once A Day 9)  Loratadine 10 Mg Tabs (Loratadine) .... Take 1 Tablet By Mouth Once A Day 10)  Nitrostat 0.4 Mg Subl (Nitroglycerin) .... Use As Directed 11)  Prilosec Otc 20 Mg Tbec (Omeprazole Magnesium) .... One By Mouth Daily 12)  Sotalol Hcl 160 Mg Tabs (Sotalol Hcl) .... Take 1/2 Tablet By Mouth Two Times A Day  Allergies (verified): No Known Drug Allergies  Comments:  Nurse/Medical Assistant: The patient's medication bottles and allergies were reviewed with the patient and were updated in the Medication and Allergy Lists.  Past History:  Past Medical History: Current Problems:  ISCHEMIC CARDIOMYOPATHY (ICD-414.8) CAD (ICD-414.00) DYSLIPIDEMIA (ICD-272.4) HYPERTENSION (ICD-401.9) Diabetes mellitus COPD History of ICD Carotid artery disease... status post left CEA Valvular heart disease Chronic systolic heart failure History of postop atrial fibrillation  Review of Systems       No fevers, chills, hemoptysis, dysphagia, melena, hematocheezia, hematuria, rash, claudication, orthopnea, pnd, pedal edema. All other systems negative.   Vital Signs:  Patient profile:   77 year old male Height:      69 inches Weight:      163 pounds Pulse rate:   80 / minute BP sitting:   116 / 78  (left arm) Cuff size:   regular  Vitals Entered By: Carlye Grippe (March 15, 2010 2:21 PM)  Physical Exam  Additional Exam:  GEN: 77 year old  male, in no distress HEENT: NCAT,PERRLA,EOMI NECK: palpable pulses, bilateral bruits; left CEA scar; no JVD; no TM LUNGS: CTA bilaterally HEART: RRR (S1S2); no significant murmurs; no rubs; no gallops ABD: soft, NT; intact BS EXT: palpable right femoral pulse with no hematoma or ecchymosis;  soft, bilateral femoral bruits; palpable distal pulses with no edema SKIN: warm, dry MUSC: no obvious deformity NEURO: A/O (x3)     EKG  Procedure date:  03/15/2010  Findings:      normal sinus rhythm with first degree AV block at 60 bpm; normal axis; prior inferior infarct with no acute changes; QTC 0.486   ICD Specifications Following MD:  Lewayne Bunting, MD     ICD Vendor:  St Jude     ICD Model Number:  [phone]     ICD Serial Number:  [id number] ICD DOI:  06/18/2007      Lead 1:    Location: RA     DOI: 06/18/2007     Model #: 1699TC     Serial #: [id number]     Status: active Lead 2:    Location: RV     DOI: 06/18/2007     Model #: 6160     Serial #: [id number]     Status: active  Indications::  ICM   ICD Follow Up ICD Dependent:  No      Episodes Coumadin:  No  Brady Parameters Mode DDDR     Lower Rate Limit:  60     Upper Rate Limit 105 PAV 275     Sensed AV Delay:  275  Tachy Zones VF:  240     VT:  200     VT1:  176     Impression & Recommendations:  Problem # 1:  IMPLANTATION OF DEFIBRILLATOR, HX OF (ICD-V45.02)  patient presents status post recent defibrillator shock, with no recurrent discharges. He was started on sotalol 80 mg b.i.d., and QT interval remains stable. However, he is also still on carvedilol, which I will discontinue. We'll also decrease aspirin to 81 mg daily. Of note, we'll check followup metabolic profile for reassessment of hypokalemia; discharge level 3.4. Plan clinic follow up with Dr. Andee Lineman in 3 months.  Problem # 2:  ISCHEMIC CARDIOMYOPATHY (ICD-414.8)  patient had followup outpatient 2-D echocardiogram to exclude possible apical thrombus. However, it is not clear that this study was done with Definity contrast for this purpose. Therefore, I will repeat this study, with Definity, to better exclude the possibility of apical thrombus. Ejection fraction was assessed at 30-35%, which was stable.  Problem # 3:  DYSLIPIDEMIA (ICD-272.4) Assessment:  Comment Only  Problem # 4:  CAROTID BRUIT (ICD-785.9)  patient has documented carotid artery disease, status post left CEA, and is followed by the vascular surgery team in [place], with periodic carotid Dopplers.  Problem # 5:  CHRONIC SYSTOLIC HEART FAILURE (ICD-428.22)  patient remains euvolemic, on current diuretic regimen. We'll check followup blood work.  Other Orders: EKG w/ Interpretation (93000) T-Basic Metabolic Panel (94854-62703) 2-D Echocardiogram (2D Echo)  Patient Instructions: 1)  Your physician wants you to follow-up in: 3 months. You will receive a reminder letter in the mail one-two months in advance. If you don't receive a letter, please call our office to schedule the follow-up appointment. 2)  Stop Carvedilol.  3)  Decrease Aspirin to 81mg  by mouth once daily. 4)  Your physician has requested that you have an echocardiogram.  Echocardiography is a painless test that uses sound waves  to create images of your heart. It provides your doctor with information about the size and shape of your heart and how well your heart's chambers and valves are working.  This procedure takes approximately one hour. There are no restrictions for this procedure. If the results of your test are normal or stable, you will receive a letter. If they are abnormal, the nurse will contact you by phone.  5)  Your physician recommends that you go to the [facility] for lab work.

## 2010-09-05 NOTE — Assessment & Plan Note (Signed)
Summary: EPH-PER C [facility] PER ALLRED   Visit Type:  Pacemaker check   Preventive Screening-Counseling & Management  Alcohol-Tobacco     Smoking Status: quit     Year Quit: 1990  Current Medications (verified): 1)  Simvastatin 40 Mg Tabs (Simvastatin) .... One By Mouth Daily 2)  Aspirin 81 Mg Tbec (Aspirin) .... Take One Tablet By Mouth Daily 3)  Diovan 80 Mg Tabs (Valsartan) .... 1/2 By Mouth Daily 4)  Furosemide 40 Mg Tabs (Furosemide) .... Take 1 Tablet By Mouth Once A Day 5)  Ventolin Hfa 108 (90 Base) Mcg/act Aers (Albuterol Sulfate) .... 2 Puffs Every 6 Hours 6)  Glipizide Xl 2.5 Mg Xr24h-Tab (Glipizide) .... One By Mouth Daily 7)  Potassium Chloride Cr 10 Meq Cr-Caps (Potassium Chloride) .... Take 1 Tablet By Mouth Once A Day 8)  Loratadine 10 Mg Tabs (Loratadine) .... Take 1 Tablet By Mouth Two Times A Day 9)  Nitrostat 0.4 Mg Subl (Nitroglycerin) .... Use As Directed 10)  Prilosec Otc 20 Mg Tbec (Omeprazole Magnesium) .... One By Mouth Daily 11)  Sotalol Hcl 160 Mg Tabs (Sotalol Hcl) .... Take 1/2 Tablet By Mouth Two Times A Day  Allergies (verified): No Known Drug Allergies  Comments:  Nurse/Medical Assistant: The patient's medication list and allergies were reviewed with the patient and were updated in the Medication and Allergy Lists.  Vital Signs:  Patient profile:   77 year old male Height:      69 inches Weight:      165 pounds O2 Sat:      97 % on Room air Pulse rate:   63 / minute BP sitting:   128 / 71  (left arm) Cuff size:   regular  Vitals Entered By: Carlye Grippe (April 07, 2010 2:44 PM)  O2 Flow:  Room air    ICD Specifications Following MD:  Lewayne Bunting, MD     ICD Vendor:  [facility] Jude     ICD Model Number:  [phone]     ICD Serial Number:  [id number] ICD DOI:  06/18/2007      Lead 1:    Location: RA     DOI: 06/18/2007     Model #: 1699TC     Serial #: [id number]     Status: active Lead 2:    Location: RV     DOI: 06/18/2007     Model #:  5638     Serial #: [id number]     Status: active  Indications::  ICM   ICD Follow Up ICD Dependent:  No      Episodes Coumadin:  No  Brady Parameters Mode DDDR     Lower Rate Limit:  60     Upper Rate Limit 105 PAV 275     Sensed AV Delay:  275  Tachy Zones VF:  240     VT:  200     VT1:  176

## 2010-09-05 NOTE — Letter (Signed)
Summary: Out of Work  [facility]  [address]  [place], [place] [address]   Phone: [phone]  Fax: [phone]    September 12, 2009   Employee: Emmit Pomfret   PATIENT: [name]    To Whom It May Concern:   For Medical reasons, please excuse the above named employee from work for the following dates:  Start:   Friday, 09/09/2008  Per FMLA paperwork, employee may be excused from work for hospital and office appointments.   If you need additional information, please feel free to contact our office.         Sincerely,    Terrance Mass, MD

## 2010-09-05 NOTE — Progress Notes (Signed)
Summary: New Medication question  Phone Note Call from Patient Call back at Home Phone [phone]   Caller: Patient Reason for Call: Talk to Nurse Summary of Call: Was put on a new heart medication this weekend while in [facility].   Amodiarone 400mg  3X a day for 7 days. He wants to know if he should me taking this much and that often Initial call taken by: Claudette Laws,  May 15, 2010 10:22 AM  Follow-up for Phone Call        Notified to pt that this dose is correct.  Will f/u with OV on Wednesday morning.  Patient verbalized understanding.  Follow-up by: Hoover Brunette, LPN,  May 15, 2010 3:37 PM

## 2010-09-05 NOTE — Assessment & Plan Note (Signed)
Summary: eph-mmh per gene this week appt   Visit Type:  Follow-up Primary Provider:  Dr. Wyvonnia Lora   History of Present Illness: the patient is a 77 year old male with history of ischemic cardiomyopathy, status post coronary bypass grafting and status post ICD implantation. The patient had a hospitalization in July of 2011 at [facility] status post ICD shock and was started on sotalol. His most recent ejection fraction actually has improved to 45-50% with resolution of the prior LV thrombus. The patient however had to be readmitted to [facility] this weekend because of recurrent ICD shock. Reportedly  the patient woke up in the middle of the night and was sitting at the bedside. He felt a little bit short of breath he then he  suddenly passed out and his wife saw that he was shaking, but this lasted only a few seconds and immediately afterwards the patient had regained normal consciousness. There were no focal neurological deficits. He was seen by the [facility] cardiology fellow, The ICD  was interrogated and he had an appropriate ICD countershock despite the use of sotalol. Sotalol was discontinued and amiodarone was started. The patient has done well since hospital discharge and is still being loaded on amiodarone. He reports no side effects like nausea or vomiting. He has had no recurrent chest pain has had no change in his exercise tolerance and no palpitations.  Preventive Screening-Counseling & Management  Alcohol-Tobacco     Smoking Status: quit     Year Quit: 1992  Current Medications (verified): 1)  Simvastatin 40 Mg Tabs (Simvastatin) .... One By Mouth Daily 2)  Aspirin 325 Mg Tabs (Aspirin) .... Take 1 Tablet By Mouth Once A Day 3)  Diovan 80 Mg Tabs (Valsartan) .... 1/2 By Mouth Daily 4)  Furosemide 40 Mg Tabs (Furosemide) .... Take 1 Tablet By Mouth Once A Day 5)  Ventolin Hfa 108 (90 Base) Mcg/act Aers (Albuterol Sulfate) .... 2 Puffs Every 6 Hours 6)  Glipizide Xl  2.5 Mg Xr24h-Tab (Glipizide) .... One By Mouth Daily 7)  Potassium Chloride Cr 10 Meq Cr-Caps (Potassium Chloride) .... Take 1 Tablet By Mouth Once A Day 8)  Nitrostat 0.4 Mg Subl (Nitroglycerin) .... Use As Directed 9)  Prilosec Otc 20 Mg Tbec (Omeprazole Magnesium) .... One By Mouth Daily 10)  Zyrtec Allergy 10 Mg Tabs (Cetirizine Hcl) .... Take 1 Tablet By Mouth Once A Day 11)  Metoprolol Succinate 25 Mg Xr24h-Tab (Metoprolol Succinate) .... Take One Tablet By Mouth Daily 12)  Amiodarone Hcl 400 Mg Tabs (Amiodarone Hcl) .... Take 1 Tablet By Mouth Two Times A Day X 2 Weeks, Then Decrease To One Tablet Once Daily 13)  Amiodarone Hcl 200 Mg Tabs (Amiodarone Hcl) .... Take 1 Tablet By Mouth Once A Day  Allergies: No Known Drug Allergies  Comments:  Nurse/Medical Assistant: The patient's medications were reviewed with the patient and were updated in the Medication List. Pt verbally confirmed medications. Pt states he received prescription for the weeks worth of Amio at 400mg  three times a day but needs prescription and written instructions for remaining based on instructions from the hospital. Cyril Loosen, RN, BSN (May 17, 2010 9:01 AM)  Past History:  Past Medical History: Current Problems:  ISCHEMIC CARDIOMYOPATHY (ICD-414.8) Cardiac catheterization July 20113/3 grafts patent. CAD (ICD-414.00) DYSLIPIDEMIA (ICD-272.4) HYPERTENSION (ICD-401.9) Diabetes mellitus COPD History of ICD  sotalol discontinued and started on amiodarone Carotid artery disease... status post left CEA Valvular heart disease 2+ mitral regurgitation Chronic systolic heart  failure ejection fraction 45-50% August 2011 History of postop atrial fibrillation History of LV thrombus resolved  Past Surgical History: Reviewed history from 07/11/2009 and no changes required. st,jude dual chamber cardioverter defibrillator coronary artery bypass graft (2000)  Review of Systems  The patient denies fatigue,  malaise, fever, weight gain/loss, vision loss, decreased hearing, hoarseness, chest pain, palpitations, shortness of breath, prolonged cough, wheezing, sleep apnea, coughing up blood, abdominal pain, blood in stool, nausea, vomiting, diarrhea, heartburn, incontinence, blood in urine, muscle weakness, joint pain, leg swelling, rash, skin lesions, headache, fainting, dizziness, depression, anxiety, enlarged lymph nodes, easy bruising or bleeding, and environmental allergies.    Vital Signs:  Patient profile:   77 year old male Height:      69 inches Weight:      169 pounds Pulse rate:   67 / minute BP sitting:   148 / 76  (left arm) Cuff size:   regular  Vitals Entered By: Carlye Grippe (May 17, 2010 8:49 AM) Comments Post hospital visit.    Physical Exam  Additional Exam:  General: Well-developed, well-nourished in no distress head: Normocephalic and atraumatic eyes PERRLA/EOMI intact, conjunctiva and lids normal nose: No deformity or lesions mouth normal dentition, normal posterior pharynx neck: Supple, no JVD.  No masses, thyromegaly or abnormal cervical nodes, status post left carotid endarterectomy, left carotid bruit lungs: Normal breath sounds bilaterally without wheezing.  Normal percussion heart: regular rate and rhythm with normal S1 and S2, no S3 or S4.  PMI is normal. 2/6 holosystolic murmur at the apex abdomen: Normal bowel sounds, abdomen is soft and nontender without masses, organomegaly or hernias noted.  No hepatosplenomegaly musculoskeletal: Back normal, normal gait muscle strength and tone normal pulsus: Pulse is normal in all 4 extremities Extremities: No peripheral pitting edema neurologic: Alert and oriented x 3 skin: Intact without lesions or rashes cervical nodes: No significant adenopathy psychologic: Normal affect    EKG  Procedure date:  05/17/2010  Findings:      normal sinus rhythmcommon nonspecific interventricular conduction delayold inferior  wall myocardial infarction. QTC 482 ms on amiodarone   ICD Specifications Following MD:  Lewayne Bunting, MD     ICD Vendor:  St Jude     ICD Model Number:  [phone]     ICD Serial Number:  [id number] ICD DOI:  06/18/2007      Lead 1:    Location: [facility]     DOI: 06/18/2007     Model #: [id number]     Serial #: [id number]     Status: active Lead 2:    Location: RV     DOI: 06/18/2007     Model #: 1324     Serial #: [id number]     Status: active  Indications::  ICM   ICD Follow Up ICD Dependent:  No      Episodes Coumadin:  No  Brady Parameters Mode DDDR     Lower Rate Limit:  60     Upper Rate Limit 100 PAV 275     Sensed AV Delay:  275  Tachy Zones VF:  240     VT:  176     VT1:  176     Impression & Recommendations:  Problem # 1:  VENTRICULAR TACHYCARDIA (ICD-427.1) status post recent appropriate counter shock from a dual-chamber defibrillator. Amiodarone was started. Patient seems to be tolerating this. We'll continue with his loading and check a TSH level and liver function tests in 3 months. The patient  also has an appointment with Dr. Hillery Jacks in December. Laboratory work and an echocardiogram will be done prior to this visit. If the patient has recurrent countershocks a discussion can be initiated about possible VT ablation. He can discuss this also with Dr. Johney Frame on his next visit. The following medications were removed from the medication list:    Sotalol Hcl 160 Mg Tabs (Sotalol hcl) .[name]... Take 1/2 tablet by mouth two times a day His updated medication list for this problem includes:    Aspirin 325 Mg Tabs (Aspirin) .[name]... Take 1 tablet by mouth once a day    Nitrostat 0.4 Mg Subl (Nitroglycerin) ..... Use as directed    Metoprolol Succinate 25 Mg Xr24h-tab (Metoprolol succinate) .[name]... Take one tablet by mouth daily    Amiodarone Hcl 400 Mg Tabs (Amiodarone hcl) .[name]... Take 1 tablet by mouth two times a day x 2 weeks, then decrease to one tablet once daily    Amiodarone Hcl 200 Mg Tabs  (Amiodarone hcl) .[name]... Take 1 tablet by mouth once a day  Orders: EKG w/ Interpretation (93000)  Problem # 2:  CHRONIC SYSTOLIC HEART FAILURE (ICD-428.22) no evidence of volume overload. No heart failure symptoms. The following medications were removed from the medication list:    Sotalol Hcl 160 Mg Tabs (Sotalol hcl) .[name]... Take 1/2 tablet by mouth two times a day His updated medication list for this problem includes:    Aspirin 325 Mg Tabs (Aspirin) .[name]... Take 1 tablet by mouth once a day    Diovan 80 Mg Tabs (Valsartan) .[name]... 1/2 by mouth daily    Furosemide 40 Mg Tabs (Furosemide) .[name]... Take 1 tablet by mouth once a day    Nitrostat 0.4 Mg Subl (Nitroglycerin) ..... Use as directed    Metoprolol Succinate 25 Mg Xr24h-tab (Metoprolol succinate) .[name]... Take one tablet by mouth daily    Amiodarone Hcl 400 Mg Tabs (Amiodarone hcl) .[name]... Take 1 tablet by mouth two times a day x 2 weeks, then decrease to one tablet once daily    Amiodarone Hcl 200 Mg Tabs (Amiodarone hcl) .[name]... Take 1 tablet by mouth once a day  Problem # 3:  CAROTID BRUIT (ICD-785.9) status post left carotid endarterectomy stable.  Problem # 4:  ISCHEMIC CARDIOMYOPATHY (ICD-414.8) no ischemia workup is needed. The patient had a catheterization in July of 2011 and 3 out of 3 grafts are patent. The following medications were removed from the medication list:    Sotalol Hcl 160 Mg Tabs (Sotalol hcl) .[name]... Take 1/2 tablet by mouth two times a day His updated medication list for this problem includes:    Aspirin 325 Mg Tabs (Aspirin) .[name]... Take 1 tablet by mouth once a day    Diovan 80 Mg Tabs (Valsartan) .[name]... 1/2 by mouth daily    Furosemide 40 Mg Tabs (Furosemide) .[name]... Take 1 tablet by mouth once a day    Nitrostat 0.4 Mg Subl (Nitroglycerin) ..... Use as directed    Metoprolol Succinate 25 Mg Xr24h-tab (Metoprolol succinate) .[name]... Take one tablet by mouth daily    Amiodarone Hcl 400 Mg Tabs (Amiodarone hcl) .[name]... Take 1  tablet by mouth two times a day x 2 weeks, then decrease to one tablet once daily    Amiodarone Hcl 200 Mg Tabs (Amiodarone hcl) .[name]... Take 1 tablet by mouth once a day  Patient Instructions: 1)  Continue Amiodarone 400mg  three times a day x one week (almost done) then 400mg  two times a day x 2  weeks, then 400mg  daily x one month, then decrease to 200mg  daily 2)  Check to make sure Amiodarone is generic, Pacerone will be the brand  3)  Follow up on Wednesday, 12/21 at 11:00 with [facility] check 4)  Need to have labs: CBC, BMET, TSH, & Echo done before above visit, please call office.   5)  Follow up in  4 months Prescriptions: AMIODARONE HCL 200 MG TABS (AMIODARONE HCL) Take 1 tablet by mouth once a day  #30 x 6   Entered by:   Hoover Brunette, LPN   Authorized by:   Lewayne Bunting, MD, [facility]   Signed by:   Hoover Brunette, LPN on 16/05/9603   Method used:   Electronically to        [facility]* (retail)       [address]       [place], [place]  [address]       Ph: [phone]       Fax: [phone]   RxID:   [id number] AMIODARONE HCL 400 MG TABS (AMIODARONE HCL) Take 1 tablet by mouth two times a day x 2 weeks, then decrease to one tablet once daily  #30 x 1   Entered by:   Hoover Brunette, LPN   Authorized by:   Lewayne Bunting, MD, [facility]   Signed by:   Hoover Brunette, LPN on 06/02/2535   Method used:   Electronically to        [facility]* (retail)       [address]       [place], [place]  [address]       Ph: [phone]       Fax: [phone]   RxID:   [id number]

## 2010-09-05 NOTE — Miscellaneous (Signed)
Summary: PT Progress note  PT Progress note   Imported By: Jacklynn Ganong 12/15/2009 16:11:57  _____________________________________________________________________  External Attachment:    Type:   Image     Comment:   External Document

## 2010-09-05 NOTE — Letter (Signed)
Summary: Hosp progress note  Hosp progress note   Imported By: Cammie Sickle 09/01/2009 11:11:16  _____________________________________________________________________  External Attachment:    Type:   Image     Comment:   External Document

## 2010-09-05 NOTE — Consult Note (Signed)
Summary: [facility]   [facility]   Imported By: Roderic Ovens 08/29/2009 12:53:02  _____________________________________________________________________  External Attachment:    Type:   Image     Comment:   External Document

## 2010-09-05 NOTE — Progress Notes (Signed)
Summary: weakness, can't tolerate Amiodarone        Phone Note Call from Patient   Caller: VOICEMAIL MESSAGE Summary of Call: Can't tolerate lower dosage of Amiiodarone, legs weak, cant get around   Left message to return call.  Hoover Brunette, LPN  May 31, 2010 11:55 AM   Follow-up for Phone Call        C/O of not being able to sleep at night, but seems to have gotten more noticeable - only sleeping about 2 hours.  Lower part of legs feel very weak and tired.  Comes and goes.  Thinks that med is going to affect his breathing.  Has breathing problem anyway, but seems like has episode of breathing a little harder at times.  Not sure what to do.  Will continue taking until hear back from MD.   Hoover Brunette, LPN  May 31, 2010 2:10 PM   Additional Follow-up for Phone Call Additional follow up Details #1::        Stop amiodarone and we can restart sotalol at previous dose, would work in with gene this week to start Sotalol. D/C amio now and wait to start sotalol until OV.  Additional Follow-up by: Lewayne Bunting, MD, [facility],  June 05, 2010 6:25 AM    Additional Follow-up for Phone Call Additional follow up Details #2::    Left message to return call.  Hoover Brunette, LPN  June 05, 2010 10:39 AM   Patient notified.   OV scheduled for Wednesday, November 2 at 10:20 with Gene. Hoover Brunette, LPN  June 05, 2010 3:48 PM

## 2010-09-05 NOTE — Miscellaneous (Signed)
Summary: PT Clinical evaluation  PT Clinical evaluation   Imported By: Jacklynn Ganong 12/06/2009 12:14:04  _____________________________________________________________________  External Attachment:    Type:   Image     Comment:   External Document

## 2010-09-05 NOTE — Progress Notes (Signed)
Summary: poss reaction to Amiodarone  Phone Note Call from Patient   Summary of Call: Was on Amiodarone x 7 days, states he could not tolerate meds.   Weakness, feet felt clumsy and heavy, and very fatiqued.  Has not taken any for today.  States he is feeling a little bit better this evening since not taking morning dosage.  States he does have trouble sleeping sometimes, but not like it was last p.m. was the worst.  Does also have breathing problem, but seems to be more noticeable now.   Hoover Brunette, LPN  May 22, 2010 3:07 PM  Initial call taken by: Hoover Brunette, LPN,  May 22, 2010 3:08 PM  Follow-up for Phone Call        asked him to try to 200 mg a day of amiodarone. He may just have problems with the higher dose. Would give this a couple of days to try. If not we will see him back in the office to further discuss Follow-up by: Lewayne Bunting, MD, [facility],  May 23, 2010 12:51 PM  Additional Follow-up for Phone Call Additional follow up Details #1::        Patient notified.   Will try above and let us know if have a problem. Hoover Brunette, LPN  May 23, 2010 2:52 PM

## 2010-09-05 NOTE — Progress Notes (Signed)
Summary: Notes,Labs Fax to Dr Margo Common  Phone Note Outgoing Call   Summary of Call: Office notes and Labs faxed to patient's PCP, Dr Wyvonnia Lora fax [phone]. Initial call taken by: Cammie Sickle,  September 09, 2009 2:55 PM

## 2010-09-05 NOTE — Miscellaneous (Signed)
Summary: Home Care Report  Home Care Report   Imported By: [name] 11/04/2009 09:42:46  _____________________________________________________________________  External Attachment:    Type:   Image     Comment:   discharge

## 2010-09-05 NOTE — Procedures (Signed)
Summary: 3 mth f/uper checkout on 11/30/09/tg   Current Medications (verified): 1)  Simvastatin 40 Mg Tabs (Simvastatin) .... One By Mouth Daily 2)  Aspirin 325 Mg Tabs (Aspirin) .... One By Mouth Daily 3)  Diovan 80 Mg Tabs (Valsartan) .... 1/2 By Mouth Daily 4)  Carvedilol 6.25 Mg Tabs (Carvedilol) .... Take 1 Tablet By Mouth Two Times A Day 5)  Furosemide 40 Mg Tabs (Furosemide) .... Take One Tablet By Mouth Two Times A Day 6)  Ventolin Hfa 108 (90 Base) Mcg/act Aers (Albuterol Sulfate) .... 2 Puffs Every 6 Hours 7)  Glipizide Xl 2.5 Mg Xr24h-Tab (Glipizide) .... One By Mouth Daily 8)  Potassium Chloride Cr 10 Meq Cr-Caps (Potassium Chloride) .... Take 1 Tablet By Mouth Once A Day 9)  Loratadine 10 Mg Tabs (Loratadine) .... Take 1 Tablet By Mouth Once A Day 10)  Nitrostat 0.4 Mg Subl (Nitroglycerin) .... Use As Directed 11)  Prilosec Otc 20 Mg Tbec (Omeprazole Magnesium) .... One By Mouth Daily  Allergies (verified): No Known Drug Allergies    ICD Specifications Following MD:  Lewayne Bunting, MD     ICD Vendor:  St Jude     ICD Model Number:  [phone]     ICD Serial Number:  [id number] ICD DOI:  06/18/2007      Lead 1:    Location: RA     DOI: 06/18/2007     Model #: [id number]     Serial #: [id number]     Status: active Lead 2:    Location: RV     DOI: 06/18/2007     Model #: 2956     Serial #: [id number]     Status: active  Indications::  ICM   ICD Follow Up Remote Check?  No Battery Voltage:  3.08 V     Charge Time:  10.9 seconds     Battery Est. Longevity:  4.7 years Underlying rhythm:  SR ICD Dependent:  No       ICD Device Measurements Atrium:  Amplitude: 2.4 mV, Impedance: 330 ohms, Threshold: 0.75 V at 0.5 msec Right Ventricle:  Amplitude: 11.7 mV, Impedance: 360 ohms, Threshold: 1.0 V at 0.5 msec Shock Impedance: 42 ohms   Episodes MS Episodes:  6     Percent Mode Switch:  <1%     Coumadin:  No Shock:  0     ATP:  0     Nonsustained:  0     Atrial Pacing:  69%     Ventricular  Pacing:  <1%  Brady Parameters Mode DDDR     Lower Rate Limit:  60     Upper Rate Limit 105 PAV 275     Sensed AV Delay:  275  Tachy Zones VF:  240     VT:  200     VT1:  176     Next Cardiology Appt Due:  05/06/2010 Tech Comments:  No parameter changes.  Device function normal.  ROV 3 months RDS clinic. Altha Harm, LPN  February 21, 2010 2:38 PM

## 2010-09-05 NOTE — Miscellaneous (Signed)
Summary: Report on incision  Report on incision   Imported By: Jacklynn Ganong 09/07/2009 16:26:48  _____________________________________________________________________  External Attachment:    Type:   Image     Comment:   External Document

## 2010-09-05 NOTE — Miscellaneous (Signed)
Summary: [facility] plan of care  [facility] plan of care   Imported By: Jacklynn Ganong 09/13/2009 15:47:51  _____________________________________________________________________  External Attachment:    Type:   Image     Comment:   External Document

## 2010-09-05 NOTE — Assessment & Plan Note (Signed)
Summary: 6 WK RE-CK/XRAYS LT KNEE/POST OP 09/16/09/MEDICARE,AARP/CAF   Visit Type:  Follow-up Primary Provider:  Dr. Wyvonnia Lora  CC:  post op knee.  History of Present Illness: 77 year old male with the following 09/15/09 revision left knee. open treatment internal fixation with tension band  left knee patella revision OTIF he's doing well minimal pain  longer requiring any pain medication.  He is doing well. His range of motion is now 0-110 he has full extension power.  X-ray was taken today 2 views of the LEFT knee, which show tension band construct, intact. Patella fracture, healed.  Recommend normal activity. Follow up as needed. No other therapy necessary        Allergies: No Known Drug Allergies   Other Orders: Post-Op Check (54098) Knee x-ray, 1/2 views (11914)  Patient Instructions: 1)  Please schedule a follow-up appointment as needed. 2)  ok to stop therapy

## 2010-09-05 NOTE — Assessment & Plan Note (Signed)
Summary: [facility]   Visit Type:  Follow-up Primary Provider:  Dr. Wyvonnia Lora   History of Present Illness: the patient is a 77 year old male with history of ischemic myopathy, status post coronary artery bypass grafting and status post ICD placement. The patient was recently hospitalized in July at [facility] status post ICD shock and started on sotalol. A prior ejection fraction was 25-30% with LV thrombus. More recently however his ejection fraction is 45-50% with resolution of LV thrombus.  The patient also has known carotid artery disease and is status post prior left carotid endarterectomy. He saw bibasilar surgery. He also had a cardiac catheterization July 2011 3 out of 3 grafts patent and 2+ mitral regurgitation.  The patient reports no chest pain or shortness of breath. He did feel weak and has lost his appetite. He has lost significant amount of weight. From a cardiac standpoint however he stable with no palpitations or syncope or recurrent countershock.  Preventive Screening-Counseling & Management  Alcohol-Tobacco     Smoking Status: quit     Year Quit: 1992  Comments: started smoking around [age] yrs old  Current Medications (verified): 1)  Simvastatin 40 Mg Tabs (Simvastatin) .... One By Mouth Daily 2)  Aspirin 81 Mg Tbec (Aspirin) .... Take One Tablet By Mouth Daily 3)  Diovan 80 Mg Tabs (Valsartan) .... 1/2 By Mouth Daily 4)  Furosemide 40 Mg Tabs (Furosemide) .... Take 1 Tablet By Mouth Once A Day 5)  Ventolin Hfa 108 (90 Base) Mcg/act Aers (Albuterol Sulfate) .... 2 Puffs Every 6 Hours 6)  Glipizide Xl 2.5 Mg Xr24h-Tab (Glipizide) .... One By Mouth Daily 7)  Potassium Chloride Cr 10 Meq Cr-Caps (Potassium Chloride) .... Take 1 Tablet By Mouth Once A Day 8)  Nitrostat 0.4 Mg Subl (Nitroglycerin) .... Use As Directed 9)  Prilosec Otc 20 Mg Tbec (Omeprazole Magnesium) .... One By Mouth Daily 10)  Sotalol Hcl 160 Mg Tabs (Sotalol Hcl) .... Take 1/2  Tablet By Mouth Two Times A Day  Allergies: No Known Drug Allergies  Comments:  Nurse/Medical Assistant: The patient's medications were reviewed with the patient and were updated in the Medication List. Pt verbally confirmed medications stating no changes since last office visit. Cyril Loosen, RN, BSN (April 27, 2010 1:27 PM)  Past History:  Past Medical History: Current Problems:  ISCHEMIC CARDIOMYOPATHY (ICD-414.8) CAD (ICD-414.00) DYSLIPIDEMIA (ICD-272.4) HYPERTENSION (ICD-401.9) Diabetes mellitus COPD History of ICD  on sotalol Carotid artery disease... status post left CEA Valvular heart disease2+ mitral regurgitation Chronic systolic heart failure ejection fraction 45-50% August 2011 History of postop atrial fibrillation History of LV thrombus resolved  Review of Systems       The patient complains of fatigue, weight gain/loss, and muscle weakness.  The patient denies malaise, fever, vision loss, decreased hearing, hoarseness, chest pain, palpitations, shortness of breath, prolonged cough, wheezing, sleep apnea, coughing up blood, abdominal pain, blood in stool, nausea, vomiting, diarrhea, heartburn, incontinence, blood in urine, joint pain, leg swelling, rash, skin lesions, headache, fainting, dizziness, depression, anxiety, enlarged lymph nodes, easy bruising or bleeding, and environmental allergies.    Vital Signs:  Patient profile:   77 year old male Height:      69 inches Weight:      165 pounds BMI:     24.45 Pulse rate:   66 / minute BP sitting:   123 / 76  (left arm) Cuff size:   regular  Vitals Entered By: Cyril Loosen, RN, BSN (April 27, 2010  1:25 PM) Comments Follow up visit to discuss Echo/possible thrombus   Physical Exam  Additional Exam:  General: Well-developed, well-nourished in no distress head: Normocephalic and atraumatic eyes PERRLA/EOMI intact, conjunctiva and lids normal nose: No deformity or lesions mouth normal dentition,  normal posterior pharynx neck: Supple, no JVD.  No masses, thyromegaly or abnormal cervical nodes, status post left carotid endarterectomy, left carotid bruit lungs: Normal breath sounds bilaterally without wheezing.  Normal percussion heart: regular rate and rhythm with normal S1 and S2, no S3 or S4.  PMI is normal. 2/6 holosystolic murmur at the apex abdomen: Normal bowel sounds, abdomen is soft and nontender without masses, organomegaly or hernias noted.  No hepatosplenomegaly musculoskeletal: Back normal, normal gait muscle strength and tone normal pulsus: Pulse is normal in all 4 extremities Extremities: No peripheral pitting edema neurologic: Alert and oriented x 3 skin: Intact without lesions or rashes cervical nodes: No significant adenopathy psychologic: Normal affect    EKG  Procedure date:  04/27/2010  Findings:      normal sinus rhythm with PACs. Nonspecific IVCD. Old inferior infarct pattern.   ICD Specifications Following MD:  Lewayne Bunting, MD     ICD Vendor:  [facility]     ICD Model Number:  [id number]     ICD Serial Number:  [id number] ICD DOI:  06/18/2007      Lead 1:    Location: [facility]     DOI: 06/18/2007     Model #: [id number]     Serial #: [id number]     Status: active Lead 2:    Location: RV     DOI: 06/18/2007     Model #: 1308     Serial #: [id number]     Status: active  Indications::  ICM   ICD Follow Up ICD Dependent:  No      Episodes Coumadin:  No  Brady Parameters Mode DDDR     Lower Rate Limit:  60     Upper Rate Limit 100 PAV 275     Sensed AV Delay:  275  Tachy Zones VF:  240     VT:  176     VT1:  176     Impression & Recommendations:  Problem # 1:  VENTRICULAR TACHYCARDIA (ICD-427.1) patient is status post ICD and is on sotalol. Electrocardiogram shows normal sinus rhythm with old inferior infarct pattern and nonspecific T-wave changes. No recurrent countershock. His updated medication list for this problem includes:    Aspirin 81 Mg Tbec (Aspirin) .[name]...  Take one tablet by mouth daily    Nitrostat 0.4 Mg Subl (Nitroglycerin) ..... Use as directed    Sotalol Hcl 160 Mg Tabs (Sotalol hcl) .[name]... Take 1/2 tablet by mouth two times a day  Problem # 2:  CHRONIC SYSTOLIC HEART FAILURE (ICD-428.22) improvement in ejection fraction. Continue current medical therapy. No evidence of volume overload. Ejection fraction improved to 45-50% His updated medication list for this problem includes:    Aspirin 81 Mg Tbec (Aspirin) .[name]... Take one tablet by mouth daily    Diovan 80 Mg Tabs (Valsartan) .[name]... 1/2 by mouth daily    Furosemide 40 Mg Tabs (Furosemide) .[name]... Take 1 tablet by mouth once a day    Nitrostat 0.4 Mg Subl (Nitroglycerin) ..... Use as directed    Sotalol Hcl 160 Mg Tabs (Sotalol hcl) .[name]... Take 1/2 tablet by mouth two times a day  Problem # 3:  CAROTID BRUIT (ICD-785.9) the patient's follow up in [facility]  with vascular surgery  Problem # 4:  WEAKNESS (ICD-780.79) the patient has lost a significant amount of weight. I asked him to follow up with his primary care physician.  Other Orders: EKG w/ Interpretation (93000)  Patient Instructions: 1)  Your physician recommends that you continue on your current medications as directed. Please refer to the Current Medication list given to you today. 2)  Follow up in  6 months

## 2010-09-05 NOTE — Letter (Signed)
Summary: Physical therapy order re-[facility]  Physical therapy order [facility]   Imported By: Cammie Sickle 11/16/2009 17:20:15  _____________________________________________________________________  External Attachment:    Type:   Image     Comment:   External Document

## 2010-09-05 NOTE — Assessment & Plan Note (Signed)
Summary: 3 WK RE-CK/XRAYS LT KNEE AP + LAT/POST OP 09/16/09/MEDICARE,AA...   Visit Type:  Follow-up Primary Provider:  Dr. Wyvonnia Lora  CC:  post op knee.  History of Present Illness: POD 50  09/15/09 revision left knee.  left knee patella revision OTIF he's doing well minimal pain  Looks good  Today AP and Lateral of the left knee.   Lorcet plus as needed, has not needed in a while.  Doing great, in post op today and walker.  range of motion passively 0-45 or so. The brace 0-30 to be locked in extension. He can start physical therapy, passive range of motion and quadriceps strengthening.  X-ray 2 views, LEFT knee fixation wire, tension band noted for transverse patella fracture. Fracture is healing nicely. Fixation is intact.  Impression healing transverse patella fracture.  6 week followup. Physical therapy at home       Allergies: No Known Drug Allergies   Other Orders: Physical Therapy Referral (PT) Post-Op Check (38756)  Patient Instructions: 1)  Do therapy at home through home care therapy 2)  come back in 6 weeks for a recheck 3)  wear brace while walking

## 2010-09-05 NOTE — Assessment & Plan Note (Signed)
Summary: HOSP FOL/UP POST OP OTIF RT KNEE/XRAY/?SUT REM/MED/AARP/BSF   Primary Provider:  Dr. Wyvonnia Lora   History of Present Illness: post op day 10   left knee patella revision OTIF he's doing well minimal pain  Looks good  Staples removed  Long-leg brace locked in extension  Return one week x-rays LEFT knee 2 views AP lateral and determine if any motion can be started.  Definitely want to go slow.  Allergies: No Known Drug Allergies   Impression & Recommendations:  Problem # 1:  AFTERCARE FOLLOW SURGERY MUSCULOSKEL SYSTEM NEC (ICD-V58.78)  Orders: Post-Op Check (16109)  Problem # 2:  OTH MECH COMPL OTH INT ORTHOPEDIC DEVC IMPL&GFT (ICD-996.49)  Orders: Post-Op Check (60454)  Problem # 3:  CLOSED FRACTURE OF PATELLA (ICD-822.0)  Orders: Post-Op Check (09811)  Patient Instructions: 1)  Wife will return to work Weds 2)  Return in 1 week x-rays left knee 2 views

## 2010-09-05 NOTE — Cardiovascular Report (Signed)
Summary: Office Visit   Office Visit   Imported By: Roderic Ovens 12/20/2009 13:48:18  _____________________________________________________________________  External Attachment:    Type:   Image     Comment:   External Document

## 2010-09-05 NOTE — Letter (Signed)
Summary: Surgery order LT patella sched 09/16/09  Surgery order LT patella sched 09/16/09   Imported By: Cammie Sickle 09/12/2009 11:25:59  _____________________________________________________________________  External Attachment:    Type:   Image     Comment:   External Document

## 2010-09-05 NOTE — Assessment & Plan Note (Signed)
Summary: 2 MO F/U PER 12/6 OV WITH DR. CRENSHAW-JM   Visit Type:  Follow-up Primary Provider:  Dr. Wyvonnia Lora  CC:  follow-up visit.  History of Present Illness: the patient is a 77 year old male with history of ischemic cardiomyopathy, ventricular tachycardia status post ICD implant. Hypertension and chronic systolic heart failure and ejection fraction of 31%. Echocardiogram demonstrated an ejection fraction of 30-35%. The patient also has carotid artery disease and status post left carotid endarterectomy. He currently has a 60-79% stenosis on the left side.  The patient was seen by Dr. Ladona Ridgel recently after he experienced an ICD shock while he was removing a light bulb and thought that he got electrocuted by the light. During this incident the patient slipped and fell and broke his leg and required surgical care. He now reports he needs a repeat surgery due to complications.  Norvasc standpoint is stable. he reports no chest pain, shortness of breath at rest or exertion. No palpitations presyncope or syncope.  Preventive Screening-Counseling & Management  Alcohol-Tobacco     Smoking Status: quit     Year Quit: 1992  Current Problems (verified): 1)  Oth Mech Compl Oth Int Orthopedic Devc Impl&gft  (VOZ-366.44) 2)  Aftercare Follow Surgery Musculoskel System Nec  (ICD-V58.78) 3)  Closed Fracture of Patella  (ICD-822.0) 4)  CHF  (ICD-428.0) 5)  Implantation of Defibrillator, Hx of  (ICD-V45.02) 6)  Preoperative Examination  (ICD-V72.84) 7)  Dm  (ICD-250.00) 8)  Cerebrovascular Disease  (ICD-437.9) 9)  Ischemic Cardiomyopathy  (ICD-414.8) 10)  Cad  (ICD-414.00) 11)  Dyslipidemia  (ICD-272.4) 12)  Hypertension  (ICD-401.9)  Current Medications (verified): 1)  Simvastatin 40 Mg Tabs (Simvastatin) .... One By Mouth Daily 2)  Aspirin 325 Mg Tabs (Aspirin) .... One By Mouth Daily 3)  Diovan 80 Mg Tabs (Valsartan) .... One By Mouth Daily 4)  Carvedilol 6.25 Mg Tabs (Carvedilol) ....  Take 1 Tablet By Mouth Two Times A Day 5)  Furosemide 40 Mg Tabs (Furosemide) .... Take One Tablet By Mouth Two Times A Day 6)  Ventolin Hfa 108 (90 Base) Mcg/act Aers (Albuterol Sulfate) .... 2 Puffs Every 6 Hours 7)  Glipizide Xl 2.5 Mg Xr24h-Tab (Glipizide) .... One By Mouth Daily 8)  Potassium Chloride Cr 10 Meq Cr-Caps (Potassium Chloride) .... Take 1 Tablet By Mouth Once A Day 9)  Loratadine 10 Mg Tabs (Loratadine) .... Take 1 Tablet By Mouth Once A Day 10)  Fluticasone Propionate 50 Mcg/act Susp (Fluticasone Propionate) .... Two Sprays/nostril Daily 11)  Ra Acid Reducer Max St 150 Mg Tabs (Ranitidine Hcl) .... Take 1 Tablet By Mouth Once A Day  Allergies (verified): No Known Drug Allergies  Comments:  Nurse/Medical Assistant: The patient's medications and allergies were reviewed with the patient and were updated in the Medication and Allergy Lists. List reviewed.  Past History:  Past Medical History: Last updated: 07/11/2009 Current Problems:  ISCHEMIC CARDIOMYOPATHY (ICD-414.8) CAD (ICD-414.00) DYSLIPIDEMIA (ICD-272.4) HYPERTENSION (ICD-401.9) Diabetes mellitus COPD History of ICD  Past Surgical History: Last updated: 07/11/2009 st,jude dual chamber cardioverter defibrillator coronary artery bypass graft (2000)  Family History: Last updated: 2009/03/08 Father:deceased cause unknown Mother:deceased cause unknown  Social History: Last updated: 03-08-09 Retired  Tobacco Use - No.  Alcohol Use - no Regular Exercise - no Drug Use - no  Risk Factors: Exercise: no (03/08/09)  Risk Factors: Smoking Status: quit (09/13/2009)  Family History: Reviewed history from 03/08/2009 and no changes required. Father:deceased cause unknown Mother:deceased cause unknown  Social History: Reviewed  history from 02/22/2009 and no changes required. Retired  Tobacco Use - No.  Alcohol Use - no Regular Exercise - no Drug Use - no  Review of Systems  The patient  denies fatigue, malaise, fever, weight gain/loss, vision loss, decreased hearing, hoarseness, chest pain, palpitations, shortness of breath, prolonged cough, wheezing, sleep apnea, coughing up blood, abdominal pain, blood in stool, nausea, vomiting, diarrhea, heartburn, incontinence, blood in urine, muscle weakness, joint pain, leg swelling, rash, skin lesions, headache, fainting, dizziness, depression, anxiety, enlarged lymph nodes, easy bruising or bleeding, and environmental allergies.    Vital Signs:  Patient profile:   77 year old male Height:      69 inches Weight:      167 pounds Pulse rate:   81 / minute BP sitting:   123 / 69  (left arm) Cuff size:   regular  Vitals Entered By: Carlye Grippe (September 13, 2009 10:51 AM) CC: follow-up visit   Physical Exam  Additional Exam:  General: Well-developed, well-nourished in no distress head: Normocephalic and atraumatic eyes PERRLA/EOMI intact, conjunctiva and lids normal nose: No deformity or lesions mouth normal dentition, normal posterior pharynx neck: Supple, no JVD.  No masses, thyromegaly or abnormal cervical nodesstatus post well-healed left carotid endarterectomy scar.so-called carotid bruits. lungs: Normal breath sounds bilaterally without wheezing.  Normal percussion heart: regular rate and rhythm with normal S1 and S2, no S3 or S4.  PMI is normal.  No pathological murmurs abdomen: Normal bowel sounds, abdomen is soft and nontender without masses, organomegaly or hernias noted.  No hepatosplenomegaly musculoskeletal: Back normal, normal gait muscle strength and tone normal pulsus: Pulse is normal in all 4 extremities Extremities: No peripheral pitting edema neurologic: Alert and oriented x 3 skin: Intact without lesions or rashes cervical nodes: No significant adenopathy psychologic: Normal affect    EKG  Procedure date:  09/13/2009  Findings:      normal sinus rhythm. Nonspecific IVCD. QRS duration 126 ms. Old  inferior infarct pattern.   ICD Specifications Following MD:  Lewayne Bunting, MD     ICD Vendor:  [facility] Jude     ICD Model Number:  [id number]     ICD Serial Number:  [id number] ICD DOI:  06/18/2007      Lead 1:    Location: [facility]     DOI: 06/18/2007     Model #: [id number]     Serial #: [id number]     Status: active Lead 2:    Location: RV     DOI: 06/18/2007     Model #: [id number]     Serial #: [id number]     Status: active  Indications::  ICM   ICD Follow Up ICD Dependent:  No      Episodes Coumadin:  No  Brady Parameters Mode DDDR     Lower Rate Limit:  60     Upper Rate Limit 105 PAV 275     Sensed AV Delay:  275  Tachy Zones VF:  240     VT:  200     VT1:  176     Impression & Recommendations:  Problem # 1:  PREOPERATIVE EXAMINATION (ICD-V72.84) the patient is being seen today also as part of a preoperative examination. He does have coronary artery disease and LV dysfunction. However is hemodynamically stable and well compensated. I told the patient that he should be able to proceed with surgery for redo leg surgery at relatively low risk. No further ischemia evaluation is indicated.  Problem # 2:  IMPLANTATION OF DEFIBRILLATOR, HX OF (ICD-V45.02) the patient reports no recurrent discharges.is followed closely by Dr. Ladona Ridgel. Orders: EKG w/ Interpretation (93000)  Problem # 3:  CEREBROVASCULAR DISEASE (ICD-437.9) patient's followup vascular surgery. He'll have repeat Dopplers in 6 months.  Problem # 4:  ISCHEMIC CARDIOMYOPATHY (ICD-414.8) ejection fraction stable at 3035%. Last Cardiolite was as recent as 2009. His updated medication list for this problem includes:    Aspirin 325 Mg Tabs (Aspirin) ..... One by mouth daily    Diovan 80 Mg Tabs (Valsartan) ..... One by mouth daily    Carvedilol 6.25 Mg Tabs (Carvedilol) .[name]... Take 1 tablet by mouth two times a day    Furosemide 40 Mg Tabs (Furosemide) .[name]... Take one tablet by mouth two times a day  Patient Instructions: 1)  Your physician wants  you to follow-up in: 6 months. You will receive a reminder letter in the mail one-two months in advance. If you don't receive a letter, please call our office to schedule the follow-up appointment. 2)  Your physician recommends that you continue on your current medications as directed. Please refer to the Current Medication list given to you today.

## 2010-09-05 NOTE — Progress Notes (Signed)
Summary: ICD Fired  Phone Note Call from Patient Call back at [facility] Phone [phone]   Summary of Call: Pt states he thinks his defib when off on him yesterday morning. He states he was told to call us if it went off again. Pt states he feels a little weak today but other than that he feels okay. He states this isn't the first time that it has gone off and that most of the time he doesn't even call us when it goes off.   He would like to know if he needs to do anything for this. Initial call taken by: Cyril Loosen, RN, BSN,  June 19, 2010 4:15 PM  Follow-up for Phone Call        spoke w/pt---pt didnt pass out but thought he was going to.  Pt feels fine today.  Scheduled pt for 06-23-10 @ 900 for device check in [place]. pt aware of appt and will call if he has anymore problems. Vella Kohler  June 20, 2010 8:28 AM

## 2010-09-05 NOTE — Consult Note (Signed)
Summary: CARDIOLOGY CONSULT/ [facility]  CARDIOLOGY CONSULT/ [facility]   Imported By: Zachary George 05/16/2010 16:50:23  _____________________________________________________________________  External Attachment:    Type:   Image     Comment:   External Document

## 2010-09-05 NOTE — Progress Notes (Signed)
Summary: Appointment scheduled following home care visit  Phone Note Other Incoming   Caller: fax from home health nurse Summary of Call: Fax received this morning re: home health nurse visit on 09/01/09, noting LT knee incision redness 5cm outsward from incisional line; minimal sersanquinous drainage; no fever.   Dr Romeo Apple in surgery today, nurse out of office.  I contacted patient and scheduled appointment for Monday 09/05/09 for post op wound check; appt still on schedule for 09/07/09 for Xrays. Initial call taken by: Cammie Sickle,  September 02, 2009 11:38 AM

## 2010-09-05 NOTE — Letter (Signed)
Summary: [facility] H&P D/C DR. Bea Laura  [facility] H[facility]P D/C DR. GAIL GERENA   Imported By: Zachary George 05/16/2010 16:50:57  _____________________________________________________________________  External Attachment:    Type:   Image     Comment:   External Document

## 2010-09-05 NOTE — Miscellaneous (Signed)
Summary: PT order for homecare  PT order for homecare   Imported By: Jacklynn Ganong 09/09/2009 08:23:23  _____________________________________________________________________  External Attachment:    Type:   Image     Comment:   External Document

## 2010-09-05 NOTE — Progress Notes (Signed)
Summary: [facility]Dr[facility] Myra Gianotti  [facility] My[facility] Gian[facility]   Imported By: Cyril Loosen, RN, BSN 08/17/2009 10:47:17  _____________________________________________________________________  External Attachment:    Type:   Image     Comment:   External Document

## 2010-09-05 NOTE — Letter (Signed)
Summary: Engineer, materials at [facility]  [address]   [place], [place] [address]   Phone: [phone]  Fax: [phone]        June 14, 2010 MRN: [id number]    PORFIRIO BOLLIER [address] [place], [place]  [address]    Dear Mr. YOUNGBLOOD,  Your test ordered by Selena Batten has been reviewed by your physician (or physician assistant) and was found to be normal or stable. Your physician (or physician assistant) felt no changes were needed at this time.  _X___ Echocardiogram  ____ Cardiac Stress Test  ____ Lab Work  ____ Peripheral vascular study of arms, legs or neck  ____ CT scan or X-ray  ____ Lung or Breathing test  ____ Other:   Thank you.   Cyril Loosen, RN, BSN    Duane Boston, M.D., F.A.C.C. Thressa Sheller, M.D., F.A.C.C. Oneal Grout, M.D., F.A.C.C. Cheree Ditto, M.D., F.A.C.C. Daiva Nakayama, M.D., F.A.C.C. Kenney Houseman, M.D., F.A.C.C. Jeanne Ivan, PA-C

## 2010-09-05 NOTE — Procedures (Signed)
Summary: [facility]   Current Medications (verified): 1)  Simvastatin 40 Mg Tabs (Simvastatin) .... One By Mouth Daily 2)  Aspirin 81 Mg Tbec (Aspirin) .... Take One Tablet By Mouth Daily 3)  Diovan 80 Mg Tabs (Valsartan) .... 1/2 By Mouth Daily 4)  Furosemide 40 Mg Tabs (Furosemide) .... Take 1 Tablet By Mouth Once A Day 5)  Ventolin Hfa 108 (90 Base) Mcg/act Aers (Albuterol Sulfate) .... 2 Puffs Every 6 Hours 6)  Glipizide Xl 2.5 Mg Xr24h-Tab (Glipizide) .... One By Mouth Daily 7)  Potassium Chloride Cr 10 Meq Cr-Caps (Potassium Chloride) .... Take 1 Tablet By Mouth Once A Day 8)  Loratadine 10 Mg Tabs (Loratadine) .... Take 1 Tablet By Mouth Once A Day 9)  Nitrostat 0.4 Mg Subl (Nitroglycerin) .... Use As Directed 10)  Prilosec Otc 20 Mg Tbec (Omeprazole Magnesium) .... One By Mouth Daily 11)  Sotalol Hcl 160 Mg Tabs (Sotalol Hcl) .... Take 1/2 Tablet By Mouth Two Times A Day  Allergies (verified): No Known Drug Allergies  Vital Signs:  Patient profile:   77 year old male Height:      69 inches Weight:      165 pounds O2 Sat:      97 % on Room air Pulse rate:   63 / minute Resp:     18 per minute BP sitting:   128 / 71  (right arm)  O2 Flow:  Room air   Primary Provider:  Dr. Wyvonnia Lora   History of Present Illness: The patient presents today for routine electrophysiology followup. He reports doing very well since last being seen in our clinic.  He continues to have a poor appetite.  He is following with Dr Margo Common for this.  The patient denies symptoms of palpitations, chest pain, shortness of breath, orthopnea, PND, lower extremity edema, dizziness, presyncope, syncope, or neurologic sequela. The patient is tolerating medications without difficulties and is otherwise without complaint today.    Past History:  Past Medical History: Reviewed history from 03/15/2010 and no changes required. Current Problems:  ISCHEMIC CARDIOMYOPATHY (ICD-414.8) CAD  (ICD-414.00) DYSLIPIDEMIA (ICD-272.4) HYPERTENSION (ICD-401.9) Diabetes mellitus COPD History of ICD Carotid artery disease... status post left CEA Valvular heart disease Chronic systolic heart failure History of postop atrial fibrillation  Past Surgical History: Reviewed history from 07/11/2009 and no changes required. st,jude dual chamber cardioverter defibrillator coronary artery bypass graft (2000)   Social History: Reviewed history from 02/22/2009 and no changes required. Retired  Tobacco Use - No.  Alcohol Use - no Regular Exercise - no Drug Use - no   Review of Systems       All systems are reviewed and negative except as listed in the HPI.    Physical Exam  General:  well-developed well-nourished in no acute distress. Skin is warm and dry. Head:  normocephalic and atraumatic Mouth:  Teeth, gums and palate normal. Oral mucosa normal. Neck:  supple Lungs:  Clear bilaterally to auscultation and percussion. Heart:  RRR, no m/r/g Abdomen:  Bowel sounds positive; abdomen soft and non-tender without masses, organomegaly, or hernias noted. No hepatosplenomegaly. Msk:  Back normal, normal gait. Muscle strength and tone normal. Pulses:  pulses normal in all 4 extremities Extremities:  No clubbing or cyanosis.  trace edema Neurologic:  Alert and oriented x 3. Skin:  + palmar erythema   Impression & Recommendations:  Problem # 1:  VENTRICULAR TACHYCARDIA (ICD-427.1) doing well with sotalol I have reviewed recent EKG from 8/11 which  reveals stable qt  Problem # 2:  CHRONIC SYSTOLIC HEART FAILURE (ICD-428.22) stable no medicine changes  Problem # 3:  IMPLANTATION OF DEFIBRILLATOR, HX OF (ICD-V45.02) normal ICD function as above  Problem # 4:  CAD (ICD-414.00) no symptoms of ischemia  Problem # 5:  HYPERTENSION (ICD-401.9) stable   Patient Instructions: 1)  return in 3 months    ICD Specifications Following MD:  Lewayne Bunting, MD     ICD Vendor:  [employer]     ICD Model Number:  [phone]     ICD Serial Number:  [id number] ICD DOI:  06/18/2007      Lead 1:    Location: RA     DOI: 06/18/2007     Model #: 1699TC     Serial #: [id number]     Status: active Lead 2:    Location: RV     DOI: 06/18/2007     Model #: 3086     Serial #: [id number]     Status: active  Indications::  ICM   ICD Follow Up Battery Voltage:  3.01 V     Charge Time:  11.0 seconds     Battery Est. Longevity:  4.3-4.6 yrs Underlying rhythm:  SR ICD Dependent:  No       ICD Device Measurements Atrium:  Amplitude: 2.8 mV, Impedance: 310 ohms, Threshold: 1.0 V at 0.5 msec Right Ventricle:  Amplitude: 11.7 mV, Impedance: 360 ohms, Threshold: 1.0 V at 0.5 msec Shock Impedance: 44 ohms   Episodes MS Episodes:  0     Coumadin:  No Shock:  0     ATP:  0     Nonsustained:  0     Atrial Pacing:  90%     Ventricular Pacing:  1.3%  Brady Parameters Mode DDDR     Lower Rate Limit:  60     Upper Rate Limit 100 PAV 275     Sensed AV Delay:  275  Tachy Zones VF:  240     VT:  176     VT1:  176     Next Cardiology Appt Due:  07/06/2010 Tech Comments:  NORMAL DEVICE FUNCTION.  NO EPISODES SINCE LAST CHECK ON 03-01-10. NO CHANGES MADE. ROV IN 3 MTHS W/DEVICE CLINIC. Vella Kohler  April 07, 2010 2:36 PM MD Comments:  agree

## 2010-09-05 NOTE — Assessment & Plan Note (Signed)
Summary: RE-Ck/XRAYS/LT KNEE/POST OP 09/16/09/MEDICARE,AARP/CAF   Visit Type:  Follow-up Primary Provider:  Dr. Wyvonnia Lora  CC:  postop.  History of Present Illness: POD 23.  09/15/09 revision left knee.  left knee patella revision OTIF he's doing well minimal pain  Looks good  Staples removed  Long-leg brace locked in extension, postop brace.  Today AP and Lateral of the left knee.  he only has pain in his anterior shin at nighttime this is relieved by pain medication, Lorcet plus     Allergies (verified): No Known Drug Allergies   Impression & Recommendations:  Problem # 1:  OTH MECH COMPL OTH INT ORTHOPEDIC DEVC IMPL&GFT (ICD-996.49)  Orders: Post-Op Check ([address]) Knee x-ray, 1/2 views (60454)  Problem # 2:  AFTERCARE FOLLOW SURGERY MUSCULOSKEL SYSTEM NEC (ICD-V58.78)  Orders: Post-Op Check (09811) Knee x-ray, 1/2 views (91478)  Problem # 3:  CLOSED FRACTURE OF PATELLA (ICD-822.0)  x-rays AP lateral LEFT knee ordered  Internal fixation tension band intact fracture reduction looks acceptable  Impression healing patella transverse fracture  Orders: Post-Op Check (29562) Knee x-ray, 1/2 views (13086)  Patient Instructions: 1)  Please schedule a follow-up appointment in 3 weeks. 2)  repeat xrays of the left knee ap and lateral

## 2010-09-05 NOTE — Letter (Signed)
Summary: Hospital consult failure fixation LT patella Fx  Hospital consult failure fixation LT patella Fx   Imported By: Cammie Sickle 09/20/2009 10:54:31  _____________________________________________________________________  External Attachment:    Type:   Image     Comment:   External Document

## 2010-09-05 NOTE — Letter (Signed)
Summary: Out of Work  [facility]  [address]  [place], [place] [address]   Phone: [phone]  Fax: [phone]    September 26, 2009   Employee:   Emmit Pomfret  PATIENT: [name]    To Whom It May Concern:   For Medical reasons, please excuse the above named employee from work for the following dates:  Start:   09/16/2009  End:   09/28/2009   If you need additional information, please feel free to contact our office.         Sincerely,    Sherre Lain D

## 2010-09-05 NOTE — Progress Notes (Signed)
Summary: call from home health nurse  Phone Note From Other Clinic   Caller: Home health Summary of Call: Byrd Hesselbach, Home health nurse from [facility] called. Said she is w/patient and there is slight redness at operative site. I started to offer appointment, and we lost connection on her cell phone.  I have been calling back, her cell is [phone],and have called [facility] phone. Patient currently has no appt yet scheduled; d/c summary indicates to be scheduled for 09/07/09; appt being scheduled for this date. Initial call taken by: Cammie Sickle,  September 01, 2009 12:45 PM  Follow-up for Phone Call        Nurse called back. Said she will send a fax re: findings today.   I've called patient; states "doing fine", noted redness as nurse relayed. Follow-up by: Cammie Sickle,  September 01, 2009 12:56 PM

## 2010-09-05 NOTE — Progress Notes (Signed)
Summary: home therapy order cancelled  Phone Note Outgoing Call   Summary of Call: Called [facility] home therapy ph [phone], spoke w/Jessica;  cancelled the home PT, per Dr Mort Sawyers orders and previous note. Patient had already been notified, out-patient therapy ordered. Initial call taken by: Cammie Sickle,  November 17, 2009 11:54 AM

## 2010-09-05 NOTE — Progress Notes (Signed)
Summary: called [employer] rep for patients pacemaker  Phone Note Outgoing Call Call back at [facility] [address] [employer] rep [phone]   Summary of Call: patient has a pacemaker and is having surgery this friday 09/16/09 I called rep to see if he can be ther to turn pacemaker on and off for the surgery. Left number to call me back at the office, I LMOM.  Follow-up for Phone Call        Arlys John called [facility] and said he will be there around 10 oclock am 09/16/09 Follow-up by: Ether Griffins,  September 13, 2009 3:12 PM

## 2010-09-05 NOTE — Consult Note (Signed)
Summary: Consultation Report  Consultation Report   Imported By: Dorise Hiss 03/09/2010 16:06:22  _____________________________________________________________________  External Attachment:    Type:   Image     Comment:   External Document

## 2010-09-05 NOTE — Letter (Signed)
Summary: [facility] Consult   Imported By: Cammie Sickle 08/31/2009 11:38:56  _____________________________________________________________________  External Attachment:    Type:   Image     Comment:   External Document

## 2010-09-05 NOTE — Assessment & Plan Note (Signed)
Summary: 1 yr f/u per checkout on 06/09/08/tg   Visit Type:  Follow-up Primary Provider:  Dr. Wyvonnia Lora   History of Present Illness: [name] returns today for followup.  He is a pleasant 77 yo man with an ICM, VT, CAD, HTN, Chronic CHF who returns for followup.  He experienced an ICD shock several weeks ago while he was removing a light bulb and thought that he got electrocuted by the light.  He did not pass out completely.  He subsequently slipped and fell several days later and broke his leg and had surgical repair.  He is still in a splint.  He denies c/p, sob, or peripheral edema.  Current Medications (verified): 1)  Simvastatin 40 Mg Tabs (Simvastatin) .... One By Mouth Daily 2)  Aspirin 325 Mg Tabs (Aspirin) .... One By Mouth Daily 3)  Prilosec Otc 20 Mg Tbec (Omeprazole Magnesium) .... One By Mouth Daily 4)  Diovan 80 Mg Tabs (Valsartan) .... One By Mouth Daily 5)  Carvedilol 6.25 Mg Tabs (Carvedilol) .... Take 1 Tablet By Mouth Two Times A Day 6)  Furosemide 40 Mg Tabs (Furosemide) .... Take One Tablet By Mouth Two Times A Day 7)  Ventolin Hfa 108 (90 Base) Mcg/act Aers (Albuterol Sulfate) .... 2 Puffs Every 6 Hours 8)  Glipizide Xl 2.5 Mg Xr24h-Tab (Glipizide) .... One By Mouth Daily 9)  Potassium Chloride Cr 10 Meq Cr-Caps (Potassium Chloride) .... Take 1 Tablet By Mouth Once A Day 10)  Loratadine 10 Mg Tabs (Loratadine) .... Take 1 Tablet By Mouth Once A Day 11)  Fluticasone Propionate 50 Mcg/act Susp (Fluticasone Propionate) .... Two Sprays/nostril Daily  Allergies (verified): No Known Drug Allergies  Past History:  Past Medical History: Last updated: 07/11/2009 Current Problems:  ISCHEMIC CARDIOMYOPATHY (ICD-414.8) CAD (ICD-414.00) DYSLIPIDEMIA (ICD-272.4) HYPERTENSION (ICD-401.9) Diabetes mellitus COPD History of ICD  Past Surgical History: Last updated: 07/11/2009 st,jude dual chamber cardioverter defibrillator coronary artery bypass graft  (2000)  Review of Systems  The patient denies chest pain, syncope, dyspnea on exertion, and peripheral edema.    Vital Signs:  Patient profile:   77 year old male Weight:      168 pounds Pulse rate:   67 / minute BP sitting:   108 / 63  (right arm)  Vitals Entered By: Dreama Saa, CNA (August 31, 2009 3:09 PM)  Physical Exam  General:  well-developed well-nourished in no acute distress. Skin is warm and dry. Head:  HEENT is normal. Neck:  supple with no thyromegaly. Chest Wall:  Well healed ICD incision. Lungs:  Clear blilaterally with no wheezes, rales, or rhonchii,. Heart:  RRR with normal S1 and S2.  PMI is enlarged and laterally displaced. Abdomen:  soft and nontender. No masses palpated. Pulses:  pulses normal in all 4 extremities Extremities:  no edema. Neurologic:  grossly intact.    ICD Specifications Following MD:  Lewayne Bunting, MD     ICD Vendor:  [facility]     ICD Model Number:  [phone]     ICD Serial Number:  [id number] ICD DOI:  06/18/2007      Lead 1:    Location: RA     DOI: 06/18/2007     Model #: 1699TC     Serial #: [id number]     Status: active Lead 2:    Location: RV     DOI: 06/18/2007     Model #: 3086     Serial #: [id number]     Status: active  Indications::  ICM   ICD Follow Up Remote Check?  No Battery Voltage:  3.16 V     Charge Time:  10.4 seconds     Battery Est. Longevity:  5.1 years Underlying rhythm:  SR ICD Dependent:  No       ICD Device Measurements Atrium:  Amplitude: 2.5 mV, Impedance: 340 ohms, Threshold: 1.0 V at 0.5 msec Right Ventricle:  Amplitude: 11.7 mV, Impedance: 380 ohms, Threshold: 1.0 V at 0.5 msec Shock Impedance: 46 ohms   Episodes MS Episodes:  19     Percent Mode Switch:  <1%     Coumadin:  No Shock:  1     ATP:  1     Nonsustained:  0     ICD Appropriate Therapy?  Yes  Brady Parameters Mode DDDR     Lower Rate Limit:  60     Upper Rate Limit 105 PAV 275     Sensed AV Delay:  275  Tachy Zones VF:  240     VT:   200     VT1:  176     Next Cardiology Appt Due:  11/04/2009 Tech Comments:  No parameter changes.  Device function normal.  ROV 3 months RDS clinic. Altha Harm, LPN  August 31, 2009 3:32 PM  MD Comments:  Agree with above.  VT seen at 220/min.  Impression & Recommendations:  Problem # 1:  IMPLANTATION OF DEFIBRILLATOR, HX OF (ICD-V45.02) His device is working normally.  Will recheck several months.  Problem # 2:  ISCHEMIC CARDIOMYOPATHY (ICD-414.8) He denies anginal symptoms.  Continue current meds. His updated medication list for this problem includes:    Aspirin 325 Mg Tabs (Aspirin) ..... One by mouth daily    Diovan 80 Mg Tabs (Valsartan) ..... One by mouth daily    Carvedilol 6.25 Mg Tabs (Carvedilol) .[name]... Take 1 tablet by mouth two times a day    Furosemide 40 Mg Tabs (Furosemide) .[name]... Take one tablet by mouth two times a day  Problem # 3:  HYPERTENSION (ICD-401.9) A low sodium diet is recommended His updated medication list for this problem includes:    Aspirin 325 Mg Tabs (Aspirin) ..... One by mouth daily    Diovan 80 Mg Tabs (Valsartan) ..... One by mouth daily    Carvedilol 6.25 Mg Tabs (Carvedilol) .[name]... Take 1 tablet by mouth two times a day    Furosemide 40 Mg Tabs (Furosemide) .[name]... Take one tablet by mouth two times a day

## 2010-09-05 NOTE — Assessment & Plan Note (Signed)
Summary: icd fired/sjm   Current Medications (verified): 1)  Simvastatin 40 Mg Tabs (Simvastatin) .... One By Mouth Daily 2)  Aspirin 325 Mg Tabs (Aspirin) .... Take 1 Tablet By Mouth Once A Day 3)  Diovan 80 Mg Tabs (Valsartan) .... 1/2 By Mouth Daily 4)  Furosemide 40 Mg Tabs (Furosemide) .... Take 1 Tablet By Mouth Once A Day 5)  Ventolin Hfa 108 (90 Base) Mcg/act Aers (Albuterol Sulfate) .... 2 Puffs Every 6 Hours 6)  Glipizide Xl 2.5 Mg Xr24h-Tab (Glipizide) .... One By Mouth Daily 7)  Potassium Chloride Cr 10 Meq Cr-Caps (Potassium Chloride) .... Take 1 Tablet By Mouth Once A Day 8)  Nitrostat 0.4 Mg Subl (Nitroglycerin) .... Use As Directed 9)  Prilosec Otc 20 Mg Tbec (Omeprazole Magnesium) .... One By Mouth Daily 10)  Zyrtec Allergy 10 Mg Tabs (Cetirizine Hcl) .... Take 1 Tablet By Mouth Twice A Day 11)  Sotalol Hcl 80 Mg Tabs (Sotalol Hcl) .... Take One Tablet By Mouth Twice A Day  Allergies (verified): No Known Drug Allergies    ICD Specifications Following MD:  Lewayne Bunting, MD     ICD Vendor:  [employer]     ICD Model Number:  [phone]     ICD Serial Number:  [id number] ICD DOI:  06/18/2007      Lead 1:    Location: RA     DOI: 06/18/2007     Model #: 1699TC     Serial #: [id number]     Status: active Lead 2:    Location: RV     DOI: 06/18/2007     Model #: 1308     Serial #: [id number]     Status: active  Indications::  ICM   ICD Follow Up Battery Voltage:  2.92 V     Charge Time:  11.3 seconds     Battery Est. Longevity:  3.9 yrs Underlying rhythm:  SR ICD Dependent:  No       ICD Device Measurements Atrium:  Amplitude: 3.4 mV, Impedance: 350 ohms,  Right Ventricle:  Amplitude: 11.7 mV, Impedance: 410 ohms,  Shock Impedance: 51 ohms   Episodes MS Episodes:  3     Coumadin:  No Shock:  0     ATP:  0     Nonsustained:  0     Atrial Therapies:  0 Atrial Pacing:  92%     Ventricular Pacing:  <1%  Brady Parameters Mode DDDR     Lower Rate Limit:  60     Upper Rate Limit  100 PAV 275     Sensed AV Delay:  275  Tachy Zones VF:  240     VT:  176     VT1:  176     Next Cardiology Appt Due:  07/26/2010 Tech Comments:  INTERROGATION ONLY----PT THOUGHT ICD FIRED ON 06-18-10.  PT HAD SYNCOPAL EPISODE.  NO VT/VF EPISODES NOTED ON CHECK. 3 AMS EPISODES--LONGEST WAS 3 MINUTES 28 SECONDS.   ROV 08-28-10 @ 1400 W/JA.  Vella Kohler  June 23, 2010 9:25 AM

## 2010-09-05 NOTE — Assessment & Plan Note (Signed)
Summary: WOUND CHECK/POST OP OTIF/LT PATELLA FX/SURG 08/24/09/MEDICARE/CAF   Visit Type:  Follow-up Primary Provider:  Dr. Wyvonnia Lora  CC:  post-op.  History of Present Illness: open treatment internal fixation LEFT patella with tension band on August 24 2009  current medications: Medication Robaxin and Lorcet plus, has to take 2 sometimes instead of one every 4 hrs.  his nurses asked him to come in early because his wound looks red in the middle.  Exam occurred with a walker and an immobilizer in full extension  The wound does indeed look red in the middle and look cellulitic.  There is no drainage.  The suture line otherwise looks good and we took her staples out.  X-rays are obtained today AP and lateral LEFT knee Findings There appears to be migration of one of the pins and separation of one of the fragment; alignment is normal  Impression hardware migration   Assessment: Postop LEFT patella the tension band.  He is placed in a range of motion brace lock in extension x 4 weeks  Cellulitis postop.  Start  return in one week for wound check and repeat the xrays, if further migration of the pin then repeat fixation would be needed   Start antibiotic       Allergies: No Known Drug Allergies   Impression & Recommendations:  Problem # 1:  CLOSED FRACTURE OF PATELLA (ICD-822.0) Assessment Comment Only  Orders: Post-Op Check (04540) Knee x-ray, 1/2 views (98119)  Problem # 2:  AFTERCARE FOLLOW SURGERY MUSCULOSKEL SYSTEM NEC (ICD-V58.78) Assessment: Comment Only  Orders: Post-Op Check (14782) Knee x-ray, 1/2 views (95621)  Medications Added to Medication List This Visit: 1)  Bactrim Ds 800-160 Mg Tabs (Sulfamethoxazole-trimethoprim) .[name].. 1 by mouth two times a day  Patient Instructions: 1)  Please schedule a follow-up appointment in 1 week. 2)  Repeat x-rays AP lateral LEFT knee 3)  Check wound Prescriptions: BACTRIM DS 800-160 MG TABS  (SULFAMETHOXAZOLE-TRIMETHOPRIM) 1 by mouth two times a day  #28 x 1   Entered and Authorized by:   Fuller Canada MD   Signed by:   Fuller Canada MD on 09/05/2009   Method used:   Print then Give to Patient   RxID:   [id number]

## 2010-09-05 NOTE — Assessment & Plan Note (Signed)
Summary: 1 WK RE-CK LT KNEE+RE-XRAY/WOUND CK/POST OP SURG 08/24/09/POST...   Visit Type:  Follow-up Primary Provider:  Dr. Wyvonnia Lora  CC:  post op knee.  History of Present Illness: I saw [name] in the office today for a followup visit.    Procedure  open treatment internal fixation LEFT patella with tension band on August 24 2009.  Although the patient reports he's been doing better his last x-ray last week show that the wire had migrated as well as one of the pins.  I asked him to come back for another x-ray to check it again and it's worse.  The fracture site as displaced as well.  He was able to perform a straight leg raise and I could flex his knee about 80 without any pain.  There is no lag on straight leg raise.  However, the x-rays do show migration of the tendon at the construct has failed.  I recommended that he get revision surgery.  Medication Robaxin and Lorcet plus, added Bactrim last visit has a few days left.         Current Medications (verified): 1)  Simvastatin 40 Mg Tabs (Simvastatin) .... One By Mouth Daily 2)  Aspirin 325 Mg Tabs (Aspirin) .... One By Mouth Daily 3)  Prilosec Otc 20 Mg Tbec (Omeprazole Magnesium) .... One By Mouth Daily 4)  Diovan 80 Mg Tabs (Valsartan) .... One By Mouth Daily 5)  Carvedilol 6.25 Mg Tabs (Carvedilol) .... Take 1 Tablet By Mouth Two Times A Day 6)  Furosemide 40 Mg Tabs (Furosemide) .... Take One Tablet By Mouth Two Times A Day 7)  Ventolin Hfa 108 (90 Base) Mcg/act Aers (Albuterol Sulfate) .... 2 Puffs Every 6 Hours 8)  Glipizide Xl 2.5 Mg Xr24h-Tab (Glipizide) .... One By Mouth Daily 9)  Potassium Chloride Cr 10 Meq Cr-Caps (Potassium Chloride) .... Take 1 Tablet By Mouth Once A Day 10)  Loratadine 10 Mg Tabs (Loratadine) .... Take 1 Tablet By Mouth Once A Day 11)  Fluticasone Propionate 50 Mcg/act Susp (Fluticasone Propionate) .... Two Sprays/nostril Daily 12)  Bactrim Ds 800-160 Mg Tabs  (Sulfamethoxazole-Trimethoprim) .[name].. 1 By Mouth Two Times A Day  Allergies (verified): No Known Drug Allergies  Past History:  Past Medical History: Last updated: 07/11/2009 Current Problems:  ISCHEMIC CARDIOMYOPATHY (ICD-414.8) CAD (ICD-414.00) DYSLIPIDEMIA (ICD-272.4) HYPERTENSION (ICD-401.9) Diabetes mellitus COPD History of ICD  Past Surgical History: Last updated: 07/11/2009 st,jude dual chamber cardioverter defibrillator coronary artery bypass graft (2000)  Family History: Last updated: 12-Mar-2009 Father:deceased cause unknown Mother:deceased cause unknown  Social History: Last updated: 03-12-2009 Retired  Tobacco Use - No.  Alcohol Use - no Regular Exercise - no Drug Use - no  Risk Factors: Exercise: no (03/12/2009)  Risk Factors: Smoking Status: quit (07/11/2009)  Review of Systems MS:  See HPI.  The review of systems is negative for General, Cardiac , Resp, GI, GU, Neuro, Endo, Psych, Derm, EENT, Immunology, and Lymphatic.  Physical Exam  Additional Exam:  Vital signs are stable and will be repeated at preop visit and on the day of surgery  The patient's development nutrition grooming are normal he has no deformity in his body habitus is normal  He has no swelling minimal varicosities normal pulses and temperature no edema and no tenderness  He has no lymph nodes in his neck or groin  He is ambulating with a brace with the leg locked in extension and a walker.  There is no tenderness over the patella there is  no effusion there is some mild swelling range of motion 0-80 no pain knee stable straight leg raise intact no skin extensor lag  His other extremities are well aligned without contracture subluxation atrophy tremor contracture.  His skin is warm dry and intact the incision is healed the Steri-Strips intact  He seems well coordinated with a normal RIGHT knee reflex normal upper extremity reflex normal sensation he is oriented x3 his mood and  affect are normal     Impression & Recommendations:  Problem # 1:  CLOSED FRACTURE OF PATELLA (ICD-822.0) Assessment Comment Only  data: X-rays today and last week compared to Intra-Op films.  The wire has migrated and a tension band construct has failed.  There is separation of the fracture fragments.  Orders: Post-Op Check (27253) Knee x-ray, 1/2 views (66440)  Problem # 2:  AFTERCARE FOLLOW SURGERY MUSCULOSKEL SYSTEM NEC (ICD-V58.78) Assessment: Comment Only  Orders: Post-Op Check (34742) Knee x-ray, 1/2 views (59563)  Problem # 3:  OTH MECH COMPL OTH INT ORTHOPEDIC DEVC IMPL&GFT (OVF-643.32) Assessment: Comment Only  Orders: Post-Op Check (95188) Knee x-ray, 1/2 views (41660)  Patient Instructions: 1)  DOS 09/16/09 2)  preop tuesday 09/13/09 at 1245pm, take packet with you 3)  post op 1 in our office on 09/20/09

## 2010-09-05 NOTE — Letter (Signed)
Summary: FMLA form  FMLA form   Imported By: Cammie Sickle 11/16/2009 14:06:49  _____________________________________________________________________  External Attachment:    Type:   Image     Comment:   External Document

## 2010-09-05 NOTE — Letter (Signed)
Summary: Engineer, materials at [facility]  [address]   [place], [place] [address]   Phone: [phone]  Fax: [phone]        March 29, 2010 MRN: [id number]   RONDLE LOHSE [address] [place], [place]  [address]   Dear Mr. BOGDON,  Your test ordered by Selena Batten has been reviewed by your physician (or physician assistant) and was found to be normal or stable. Your physician (or physician assistant) felt no changes were needed at this time.  ____ Echocardiogram  ____ Cardiac Stress Test  __X__ Lab Work - potassium normal  ____ Peripheral vascular study of arms, legs or neck  ____ CT scan or X-ray  ____ Lung or Breathing test  ____ Other:   Thank you.   Hoover Brunette, LPN    Duane Boston, M.D., F.A.C.C. Thressa Sheller, M.D., F.A.C.C. Oneal Grout, M.D., F.A.C.C. Cheree Ditto, M.D., F.A.C.C. Daiva Nakayama, M.D., F.A.C.C. Kenney Houseman, M.D., F.A.C.C. Jeanne Ivan, PA-C

## 2010-09-05 NOTE — Miscellaneous (Signed)
Summary: Home Care Report  Home Care Report   Imported By: Elvera Maria 10/11/2009 11:46:16  _____________________________________________________________________  External Attachment:    Type:   Image     Comment:   [facility]

## 2010-09-05 NOTE — Cardiovascular Report (Signed)
Summary: [facility]/ [facility]  [facility]/ [facility]   Imported By: Dorise Hiss 04/13/2010 10:29:59  _____________________________________________________________________  External Attachment:    Type:   Image     Comment:   External Document

## 2010-09-07 NOTE — Assessment & Plan Note (Signed)
Summary: PACER CHECK, ALSO EVAL FOR POSSIBLE ABLATION-JM   Visit Type:  Pacemaker check Primary Provider:  Dr. Wyvonnia Lora   History of Present Illness: The patient presents today for routine electrophysiology followup. He reports doing very well since last being seen in our clinic.  He denies any further ICD shocks.  He reports fatigue chronically.  He also reports SOB with moderate activity, though this is a chronic concern.  The patient denies symptoms of palpitations, chest pain,orthopnea, PND, lower extremity edema, dizziness, presyncope, syncope, or neurologic sequela. The patient is tolerating medications without difficulties and is otherwise without complaint today.   Preventive Screening-Counseling & Management  Alcohol-Tobacco     Smoking Status: quit     Year Quit: 1992  Current Medications (verified): 1)  Simvastatin 40 Mg Tabs (Simvastatin) .... One By Mouth Daily 2)  Aspirin 325 Mg Tabs (Aspirin) .... Take 1 Tablet By Mouth Once A Day 3)  Diovan 80 Mg Tabs (Valsartan) .... 1/2 By Mouth Daily 4)  Furosemide 40 Mg Tabs (Furosemide) .... Take 1 Tablet By Mouth Once A Day 5)  Ventolin Hfa 108 (90 Base) Mcg/act Aers (Albuterol Sulfate) .... 2 Puffs Every 6 Hours 6)  Glipizide Xl 2.5 Mg Xr24h-Tab (Glipizide) .... One By Mouth Daily 7)  Potassium Chloride Cr 10 Meq Cr-Caps (Potassium Chloride) .... Take 1 Tablet By Mouth Once A Day 8)  Nitrostat 0.4 Mg Subl (Nitroglycerin) .... Use As Directed 9)  Prilosec Otc 20 Mg Tbec (Omeprazole Magnesium) .... One By Mouth Daily 10)  Zyrtec Allergy 10 Mg Tabs (Cetirizine Hcl) .... Take 1 Tablet By Mouth Twice A Day 11)  Sotalol Hcl 80 Mg Tabs (Sotalol Hcl) .... Take One Tablet By Mouth Twice A Day  Allergies (verified): No Known Drug Allergies  Comments:  Nurse/Medical Assistant: The patient's medications and allergies were reviewed with the patient and were updated in the Medication and Allergy Lists. Tammi Romine CMA (August 28, 2010 2:00 PM)  Past History:  Past Medical History: Reviewed history from 05/17/2010 and no changes required. Current Problems:  ISCHEMIC CARDIOMYOPATHY (ICD-414.8) Cardiac catheterization July 20113/3 grafts patent. CAD (ICD-414.00) DYSLIPIDEMIA (ICD-272.4) HYPERTENSION (ICD-401.9) Diabetes mellitus COPD History of ICD  sotalol discontinued and started on amiodarone Carotid artery disease... status post left CEA Valvular heart disease 2+ mitral regurgitation Chronic systolic heart failure ejection fraction 45-50% August 2011 History of postop atrial fibrillation History of LV thrombus resolved  Past Surgical History: Reviewed history from 07/11/2009 and no changes required. st,jude dual chamber cardioverter defibrillator coronary artery bypass graft (2000)  Social History: Reviewed history from 02/22/2009 and no changes required. Retired  Tobacco Use - No.  Alcohol Use - no Regular Exercise - no Drug Use - no  Review of Systems       All systems are reviewed and negative except as listed in the HPI.   Vital Signs:  Patient profile:   77 year old male Height:      69 inches Weight:      170 pounds BMI:     25.20 Pulse rate:   64 / minute BP sitting:   129 / 78  (left arm) Cuff size:   regular  Vitals Entered By: Fuller Plan CMA (August 28, 2010 2:00 PM)  Physical Exam  General:  well-developed well-nourished in no acute distress. Skin is warm and dry. Head:  normocephalic and atraumatic Eyes:  PERRLA/EOM intact; conjunctiva and lids normal. Mouth:  Teeth, gums and palate normal. Oral mucosa normal. Neck:  supple Lungs:  decreased air movement throughout Heart:  RRR, no m/r/g Abdomen:  Bowel sounds positive; abdomen soft and non-tender without masses, organomegaly, or hernias noted. No hepatosplenomegaly. Msk:  multiple digit amputation R hand Extremities:  No clubbing or cyanosis. Neurologic:  Alert and oriented x 3. Skin:  Intact without lesions or  rashes.   EKG  Procedure date:  08/28/2010  Findings:      A paced 60 bpm, PR 202, QRS 128, Qtc 465, inferior infarction   ICD Specifications Following MD:  Lewayne Bunting, MD     ICD Vendor:  St Jude     ICD Model Number:  [id number]     ICD Serial Number:  [id number] ICD DOI:  06/18/2007      Lead 1:    Location: RA     DOI: 06/18/2007     Model #: [id number]     Serial #: [id number]     Status: active Lead 2:    Location: RV     DOI: 06/18/2007     Model #: [id number]     Serial #: [id number]     Status: active  Indications::  ICM   ICD Follow Up Battery Voltage:  2.96 V     Charge Time:  11.3 seconds     Battery Est. Longevity:  4.1 yrs Underlying rhythm:  SB @ 53 ICD Dependent:  No       ICD Device Measurements Atrium:  Amplitude: 3.7 mV, Impedance: 350 ohms, Threshold: 1.0 V at 0.5 msec Right Ventricle:  Amplitude: 11.7 mV, Impedance: 430 ohms, Threshold: 1.0 V at 0.5 msec Shock Impedance: 50 ohms   Episodes MS Episodes:  3     Percent Mode Switch:  <1%     Coumadin:  No Shock:  0     ATP:  0     Nonsustained:  0     Atrial Therapies:  0 Atrial Pacing:  94%     Ventricular Pacing:  2.9%  Brady Parameters Mode DDDR     Lower Rate Limit:  60     Upper Rate Limit 100 PAV 275     Sensed AV Delay:  275  Tachy Zones VF:  240     VT:  176     VT1:  176     Next Cardiology Appt Due:  11/06/2010 Tech Comments:  3 AMS EPISODES--LONGEST WAS 3 MINUTES.  NORMAL DEVICE FUNCTION.  CHANGED RECOVERY TIME FROM MEDIUM TO FAST AND SLOPE FROM 10 TO 12 DUE TO HISTOGRAM. ROV IN 3 MTHS W/DEVICE CLINIC IN [place]. Vella Kohler  August 28, 2010 2:09 PM MD Comments:  agree  Impression & Recommendations:  Problem # 1:  VENTRICULAR TACHYCARDIA (ICD-427.1) stable with sotalol no changes today if he has recurrent VT, we will consider ablation he has not tolerated amiodarone previously  Problem # 2:  CHRONIC SYSTOLIC HEART FAILURE (ICD-428.22)  stable not significant volume overloaded today normal ICD  function as above  Orders: EKG w/ Interpretation (93000)  Problem # 3:  CAD (ICD-414.00) no symptoms of ischemia no changes today  Problem # 4:  WEAKNESS (ICD-780.79) possibly due to bradycardia, we will increase rate response of ICD today

## 2010-09-13 NOTE — Cardiovascular Report (Signed)
Summary: [facility]/ [facility]  [facility]/ [facility] SUMMARY   Imported By: Dorise Hiss 09/07/2010 16:37:37  _____________________________________________________________________  External Attachment:    Type:   Image     Comment:   External Document

## 2010-09-20 ENCOUNTER — Encounter: Payer: Self-pay | Admitting: Cardiology

## 2010-10-21 LAB — POCT CARDIAC MARKERS
Myoglobin, poc: 126 ng/mL (ref 12–200)
Myoglobin, poc: 84.4 ng/mL (ref 12–200)
Troponin i, poc: <0.05 ng/mL (ref 0.00–0.09)

## 2010-10-21 LAB — BASIC METABOLIC PANEL
BUN: 11 mg/dL (ref 6–23)
BUN: 13 mg/dL (ref 6–23)
Calcium: 9.2 mg/dL (ref 8.4–10.5)
Chloride: 102 mEq/L (ref 96–112)
Creatinine, Ser: 0.91 mg/dL (ref 0.4–1.5)
Creatinine, Ser: 0.97 mg/dL (ref 0.4–1.5)
GFR calc Af Amer: >60 mL/min (ref >60)
GFR calc Af Amer: >60 mL/min (ref >60)
GFR calc non Af Amer: >60 mL/min (ref >60)
GFR calc non Af Amer: >60 mL/min (ref >60)
Potassium: 3.4 mEq/L — ABNORMAL LOW (ref 3.5–5.1)
Potassium: 3.6 mEq/L (ref 3.5–5.1)

## 2010-10-21 LAB — CBC
HCT: 34.8 % — ABNORMAL LOW (ref 39.0–52.0)
HCT: 36 % — ABNORMAL LOW (ref 39.0–52.0)
Hemoglobin: 11.9 g/dL — ABNORMAL LOW (ref 13.0–17.0)
MCH: 26.8 pg (ref 26.0–34.0)
MCHC: 33.1 g/dL (ref 30.0–36.0)
MCV: 80.7 fL (ref 78.0–100.0)
Platelets: 182 10*3/uL (ref 150–400)
Platelets: 191 10*3/uL (ref 150–400)
RBC: 4.26 MIL/uL (ref 4.22–5.81)
RBC: 4.3 MIL/uL (ref 4.22–5.81)
RDW: 16.1 % — ABNORMAL HIGH (ref 11.5–15.5)
WBC: 6 10*3/uL (ref 4.0–10.5)
WBC: 6.1 10*3/uL (ref 4.0–10.5)

## 2010-10-21 LAB — DIFFERENTIAL
Basophils Relative: 1 % (ref 0–1)
Eosinophils Absolute: 0.2 10*3/uL (ref 0.0–0.7)
Lymphs Abs: 1.4 10*3/uL (ref 0.7–4.0)
Monocytes Absolute: 0.6 10*3/uL (ref 0.1–1.0)
Monocytes Relative: 8 % (ref 3–12)

## 2010-10-21 LAB — CARDIAC PANEL(CRET KIN+CKTOT+MB+TROPI)
CK, MB: 2.6 ng/mL (ref 0.3–4.0)
Total CK: 57 U/L (ref 7–232)
Total CK: 64 U/L (ref 7–232)
Troponin I: 0.09 ng/mL — ABNORMAL HIGH (ref 0.00–0.06)

## 2010-10-21 LAB — POCT I-STAT, CHEM 8
BUN: 10 mg/dL (ref 6–23)
Calcium, Ion: 1.09 mmol/L — ABNORMAL LOW (ref 1.12–1.32)
Chloride: 103 mEq/L (ref 96–112)
Creatinine, Ser: 1 mg/dL (ref 0.4–1.5)
TCO2: 25 mmol/L (ref 0–100)

## 2010-10-21 LAB — GLUCOSE, CAPILLARY
Glucose-Capillary: 101 mg/dL — ABNORMAL HIGH (ref 70–99)
Glucose-Capillary: 114 mg/dL — ABNORMAL HIGH (ref 70–99)
Glucose-Capillary: 115 mg/dL — ABNORMAL HIGH (ref 70–99)
Glucose-Capillary: 191 mg/dL — ABNORMAL HIGH (ref 70–99)
Glucose-Capillary: 69 mg/dL — ABNORMAL LOW (ref 70–99)
Glucose-Capillary: 87 mg/dL (ref 70–99)
Glucose-Capillary: 92 mg/dL (ref 70–99)
Glucose-Capillary: 94 mg/dL (ref 70–99)
Glucose-Capillary: 98 mg/dL (ref 70–99)

## 2010-10-21 LAB — MAGNESIUM: Magnesium: 2.1 mg/dL (ref 1.5–2.5)

## 2010-10-21 LAB — PROTIME-INR
INR: 1.08 (ref 0.00–1.49)
Prothrombin Time: 13.9 seconds (ref 11.6–15.2)

## 2010-10-22 LAB — DIFFERENTIAL
Basophils Absolute: 0.1 10*3/uL (ref 0.0–0.1)
Basophils Absolute: 0.1 10*3/uL (ref 0.0–0.1)
Basophils Relative: 1 % (ref 0–1)
Eosinophils Absolute: 0 10*3/uL (ref 0.0–0.7)
Eosinophils Absolute: 0.1 10*3/uL (ref 0.0–0.7)
Eosinophils Relative: 2 % (ref 0–5)
Eosinophils Relative: 4 % (ref 0–5)
Eosinophils Relative: 4 % (ref 0–5)
Lymphocytes Relative: 18 % (ref 12–46)
Lymphocytes Relative: 23 % (ref 12–46)
Lymphs Abs: 1.3 10*3/uL (ref 0.7–4.0)
Lymphs Abs: 2.1 10*3/uL (ref 0.7–4.0)
Monocytes Absolute: 0.6 10*3/uL (ref 0.1–1.0)
Monocytes Absolute: 0.8 10*3/uL (ref 0.1–1.0)
Monocytes Relative: 15 % — ABNORMAL HIGH (ref 3–12)
Monocytes Relative: 8 % (ref 3–12)
Neutro Abs: 5 10*3/uL (ref 1.7–7.7)
Neutro Abs: 5.7 10*3/uL (ref 1.7–7.7)
Neutrophils Relative %: 73 % (ref 43–77)

## 2010-10-22 LAB — GLUCOSE, CAPILLARY
Glucose-Capillary: 104 mg/dL — ABNORMAL HIGH (ref 70–99)
Glucose-Capillary: 110 mg/dL — ABNORMAL HIGH (ref 70–99)
Glucose-Capillary: 121 mg/dL — ABNORMAL HIGH (ref 70–99)
Glucose-Capillary: 124 mg/dL — ABNORMAL HIGH (ref 70–99)
Glucose-Capillary: 134 mg/dL — ABNORMAL HIGH (ref 70–99)
Glucose-Capillary: 145 mg/dL — ABNORMAL HIGH (ref 70–99)
Glucose-Capillary: 79 mg/dL (ref 70–99)

## 2010-10-22 LAB — COMPREHENSIVE METABOLIC PANEL
ALT: 11 U/L (ref 0–53)
AST: 19 U/L (ref 0–37)
AST: 20 U/L (ref 0–37)
Albumin: 3.8 g/dL (ref 3.5–5.2)
Alkaline Phosphatase: 47 U/L (ref 39–117)
BUN: 11 mg/dL (ref 6–23)
CO2: 30 mEq/L (ref 19–32)
Calcium: 9.5 mg/dL (ref 8.4–10.5)
Chloride: 101 mEq/L (ref 96–112)
Chloride: 93 mEq/L — ABNORMAL LOW (ref 96–112)
Creatinine, Ser: 0.97 mg/dL (ref 0.4–1.5)
GFR calc Af Amer: >60 mL/min (ref >60)
GFR calc Af Amer: >60 mL/min (ref >60)
GFR calc non Af Amer: >60 mL/min (ref >60)
Sodium: 129 mEq/L — ABNORMAL LOW (ref 135–145)
Total Bilirubin: 0.7 mg/dL (ref 0.3–1.2)
Total Bilirubin: 0.8 mg/dL (ref 0.3–1.2)
Total Protein: 6.6 g/dL (ref 6.0–8.3)

## 2010-10-22 LAB — CBC
HCT: 29.5 % — ABNORMAL LOW (ref 39.0–52.0)
HCT: 33.8 % — ABNORMAL LOW (ref 39.0–52.0)
Hemoglobin: 11.5 g/dL — ABNORMAL LOW (ref 13.0–17.0)
MCV: 83.5 fL (ref 78.0–100.0)
MCV: 83.9 fL (ref 78.0–100.0)
Platelets: 182 10*3/uL (ref 150–400)
Platelets: 222 10*3/uL (ref 150–400)
RBC: 3.21 MIL/uL — ABNORMAL LOW (ref 4.22–5.81)
RDW: 14.9 % (ref 11.5–15.5)
RDW: 15 % (ref 11.5–15.5)
RDW: 15.3 % (ref 11.5–15.5)
WBC: 7.1 10*3/uL (ref 4.0–10.5)
WBC: 7.3 10*3/uL (ref 4.0–10.5)
WBC: 8.9 10*3/uL (ref 4.0–10.5)

## 2010-10-22 LAB — BASIC METABOLIC PANEL
BUN: 14 mg/dL (ref 6–23)
Calcium: 8.7 mg/dL (ref 8.4–10.5)
Calcium: 8.9 mg/dL (ref 8.4–10.5)
Chloride: 103 mEq/L (ref 96–112)
Creatinine, Ser: 1.36 mg/dL (ref 0.4–1.5)
GFR calc Af Amer: >60 mL/min (ref >60)
GFR calc non Af Amer: 51 mL/min — ABNORMAL LOW (ref >60)
GFR calc non Af Amer: >60 mL/min (ref >60)
GFR calc non Af Amer: >60 mL/min (ref >60)
Glucose, Bld: 94 mg/dL (ref 70–99)
Glucose, Bld: 94 mg/dL (ref 70–99)
Potassium: 3.4 mEq/L — ABNORMAL LOW (ref 3.5–5.1)
Potassium: 3.9 mEq/L (ref 3.5–5.1)
Sodium: 135 mEq/L (ref 135–145)
Sodium: 140 mEq/L (ref 135–145)

## 2010-10-22 LAB — PROTIME-INR: Prothrombin Time: 14.2 seconds (ref 11.6–15.2)

## 2010-10-22 LAB — SAMPLE TO BLOOD BANK

## 2010-10-22 LAB — APTT: aPTT: 30 seconds (ref 24–37)

## 2010-10-25 LAB — CBC
HCT: 34.3 % — ABNORMAL LOW (ref 39.0–52.0)
Hemoglobin: 11.5 g/dL — ABNORMAL LOW (ref 13.0–17.0)
MCHC: 33.6 g/dL (ref 30.0–36.0)
RDW: 15.4 % (ref 11.5–15.5)

## 2010-10-25 LAB — GLUCOSE, CAPILLARY
Glucose-Capillary: 103 mg/dL — ABNORMAL HIGH (ref 70–99)
Glucose-Capillary: 103 mg/dL — ABNORMAL HIGH (ref 70–99)
Glucose-Capillary: 126 mg/dL — ABNORMAL HIGH (ref 70–99)
Glucose-Capillary: 84 mg/dL (ref 70–99)
Glucose-Capillary: 94 mg/dL (ref 70–99)

## 2010-10-25 LAB — ANAEROBIC CULTURE

## 2010-10-25 LAB — WOUND CULTURE
Culture: NO GROWTH
Gram Stain: NONE SEEN

## 2010-10-25 LAB — BASIC METABOLIC PANEL
CO2: 26 mEq/L (ref 19–32)
GFR calc non Af Amer: >60 mL/min (ref >60)
Glucose, Bld: 90 mg/dL (ref 70–99)
Potassium: 4.6 mEq/L (ref 3.5–5.1)
Sodium: 136 mEq/L (ref 135–145)

## 2010-11-07 ENCOUNTER — Encounter: Payer: Self-pay | Admitting: Cardiology

## 2010-11-07 ENCOUNTER — Ambulatory Visit (INDEPENDENT_AMBULATORY_CARE_PROVIDER_SITE_OTHER): Payer: Medicare Other | Admitting: Cardiology

## 2010-11-07 VITALS — BP 130/76 | HR 63 | Ht 69.0 in | Wt 171.0 lb

## 2010-11-07 DIAGNOSIS — D649 Anemia, unspecified: Secondary | ICD-10-CM

## 2010-11-07 DIAGNOSIS — I10 Essential (primary) hypertension: Secondary | ICD-10-CM

## 2010-11-07 DIAGNOSIS — I679 Cerebrovascular disease, unspecified: Secondary | ICD-10-CM

## 2010-11-07 DIAGNOSIS — R531 Weakness: Secondary | ICD-10-CM

## 2010-11-07 DIAGNOSIS — I2589 Other forms of chronic ischemic heart disease: Secondary | ICD-10-CM

## 2010-11-07 DIAGNOSIS — R5383 Other fatigue: Secondary | ICD-10-CM

## 2010-11-07 DIAGNOSIS — Z95810 Presence of automatic (implantable) cardiac defibrillator: Secondary | ICD-10-CM

## 2010-11-07 DIAGNOSIS — I2510 Atherosclerotic heart disease of native coronary artery without angina pectoris: Secondary | ICD-10-CM

## 2010-11-07 DIAGNOSIS — I38 Endocarditis, valve unspecified: Secondary | ICD-10-CM | POA: Insufficient documentation

## 2010-11-07 DIAGNOSIS — R0602 Shortness of breath: Secondary | ICD-10-CM

## 2010-11-07 DIAGNOSIS — I5022 Chronic systolic (congestive) heart failure: Secondary | ICD-10-CM

## 2010-11-07 MED ORDER — SPIRONOLACTONE 25 MG PO TABS: 25.0000 mg | ORAL_TABLET | Freq: Every day | ORAL | Status: DC

## 2010-11-07 MED ORDER — FUROSEMIDE 40 MG PO TABS: ORAL_TABLET | ORAL | Status: DC

## 2010-11-07 NOTE — Progress Notes (Signed)
HPI The patient was recently seen in EP clinic status post ICD implant patient reported no ICD countershocks. The patient was initially on sotalol but then was switched to amiodarone. However however the patient had significant side effects of amiodarone and ultimately this was discontinued and sotalol was resumed.. He has chronic systolic heart failure but his ejection fraction is 45-50%. This was determined in August 2011. He has a history of COPD diabetes mellitus and hypertension as well as coronary artery disease with ischemic cardiomyopathy. He had a catheterization in July 2011 with 3 out of 3 grafts patent. The bypass grafting was performed in 2000. However I reviewed the chart and it appears that the patient had been echocardiogram done November of 2011 showed that his ejection fraction only 30-35% with also significant diastolic dysfunction. He has multivalve heart disease including mild to moderate aortic regurgitation and mild to moderate mitral regurgitation. The patient was recently in the emergency room for shortness of breath. It appeared that he had a COPD exacerbation but also sinus drainage. He has been placed on antibiotics and steroids and has been seen by Dr. Orson Aloe in the meanwhile. However yesterday had a particularly bad episode of increased shortness of breath at rest. The patient stated he took extra dose of Lasix and felt markedly improved. It appears that he had congestive heart failure. I also reviewed the patient's laboratory work from his ER visit several weeks ago. His creatinine was 1.17, but his hemoglobin was only 11.7 with an MCV of 81 suggested that he may be developing iron deficiency anemia. The patient then proceeded to tell me also that Blood in His Stools Sometime Ago but No Colonoscopy Was Scheduled. He Has Not Had a Colonoscopy in Many Years.  No Known Allergies  No current outpatient prescriptions on file prior to visit.    Past Medical History  Diagnosis  Date  . Hypertension   . Coronary artery disease   . Diabetes mellitus   . Other specified forms of chronic ischemic heart disease   . Paroxysmal ventricular tachycardia   . Syncope and collapse   . Automatic implantable cardiac defibrillator in situ   . Atrial fibrillation   . Mitral valve insufficiency and aortic valve stenosis   . Chronic airway obstruction, not elsewhere classified   . Old myocardial infarction   . Other and unspecified hyperlipidemia   . Congestive heart failure, unspecified   . Cerebrovascular disease, unspecified   . Other mechanical complication of other internal orthopedic device, implant, and graft   . Valvular heart disease     2+ mitral regurgitation  . CAD (coronary artery disease)     status post left CEA    Past Surgical History  Procedure Date  . Repeat otif left knee with figure-of-eight tension band.   . Implantation of a dual-chamber implantable 06/18/2007  . Left carotid endarterectomy   . Coronary artery bypass graft 04/04/2006    No family history on file.  History   Social History  . Marital Status: Married    Spouse Name: N/A    Number of Children: N/A  . Years of Education: N/A   Occupational History  . RETIRED    Social History Main Topics  . Smoking status: Former Smoker    Quit date: 08/06/1990  . Smokeless tobacco: Not on file  . Alcohol Use: No  . Drug Use: No  . Sexually Active: Not on file   Other Topics Concern  . Not on file  Social History Narrative  . No narrative on file   Review of systems:Pertinent positives as outlined above. The remainder of the 18  point review of systems is negative   PHYSICAL EXAM BP 130/76  Pulse 63  Ht 5\' 9"  (1.753 m)  Wt 171 lb (77.565 kg)  BMI 25.25 kg/m2  SpO2 97%  General: Well-developed, well-nourished in no distress Head: Normocephalic and atraumatic Eyes:PERRLA/EOMI intact, conjunctiva and lids normal Ears: No deformity or lesions Mouth:normal dentition,  normal posterior pharynx Neck: Supple, no JVD.  No masses, thyromegaly or abnormal cervical nodes Lungs: Normal breath sounds bilaterally without wheezing.  Normal percussion Cardiac: regular rate and rhythm with normal S1 and S2, no S3 or S4.  PMI is normal.  2/6 diastolic murmur left upper sternal border radiating down to the left lower sternal border holosystolic murmur 2/6 at the apex radiating to the axilla Abdomen: Normal bowel sounds, abdomen is soft and nontender without masses, organomegaly or hernias noted.  No hepatosplenomegaly MSK: Back normal, normal gait muscle strength and tone normal Vascular: Pulse is normal in all 4 extremities Extremities: No peripheral pitting edema Neurologic: Alert and oriented x 3 Skin: Intact without lesions or rashes Lymphatics: No significant adenopathyPsychologic: Normal affect   ECG: Normal sinus rhythm nonspecific IVCD old inferior wall myocardial infarction  ASSESSMENT AND PLAN   Note all way [employer]

## 2010-11-07 NOTE — Assessment & Plan Note (Signed)
Patient will need a followup echocardiogram in 6 months.

## 2010-11-07 NOTE — Assessment & Plan Note (Signed)
Apparently status post acute chronic systolic heart failure. Lasix will be increased to 60 mg by mouth daily and spironolactone will be added 25 mg by mouth daily. I carefully warned the patient not to take any potassium supplements.

## 2010-11-07 NOTE — Assessment & Plan Note (Signed)
Anemia: Rule out iron deficiency anemia. Will obtain fecal occult blood testing as well as iron studies.

## 2010-11-07 NOTE — Assessment & Plan Note (Signed)
Ischemic cardiomyopathy ejection fraction 30-35% patent grafts July 2011. Continue current medical therapy. No ischemia workup required.

## 2010-11-07 NOTE — Assessment & Plan Note (Signed)
Carotid artery disease: Status post left carotid endarterectomy. The patient still followed by vascular surgery with Dr. Hart Rochester

## 2010-11-07 NOTE — Patient Instructions (Signed)
1.  Increase Lasix to 60mg  daily 2.  Begin Spironolactone 25mg  daily 3.  Stop Potassium 4.  Fecal occult blood samples x 3  5. Your physician recommends that you go to the [facility] for lab work next week 6.  If the results of your test are normal or stable, you will receive a letter.  If they are abnormal, the nurse will contact you by phone.

## 2010-11-18 ENCOUNTER — Other Ambulatory Visit: Payer: Self-pay | Admitting: Cardiology

## 2010-11-20 ENCOUNTER — Other Ambulatory Visit: Payer: Self-pay | Admitting: *Deleted

## 2010-11-20 MED ORDER — SOTALOL HCL 160 MG PO TABS: 80.0000 mg | ORAL_TABLET | Freq: Two times a day (BID) | ORAL | Status: DC

## 2010-11-30 ENCOUNTER — Telehealth: Payer: Self-pay | Admitting: *Deleted

## 2010-11-30 MED ORDER — FERROUS SULFATE 325 (65 FE) MG PO TBEC: 325.0000 mg | DELAYED_RELEASE_TABLET | Freq: Two times a day (BID) | ORAL | Status: DC

## 2010-11-30 NOTE — Telephone Encounter (Signed)
Patient notified of labs.   

## 2010-11-30 NOTE — Telephone Encounter (Signed)
Message copied by Hoover Brunette on Thu Nov 30, 2010  4:48 PM ------      Message from: Lewayne Bunting      Created: Fri Nov 17, 2010 12:03 PM       BNP level is high, consistent with our suspicion that the patient was volume overloaded. However we started him on higher dose Lasix as well as spironolactone during this clinic visit. The patient should be feeling better and if so no followup labs are required.      He does have mild iron deficiency anemia and could benefit from an iron supplementation on a daily basis. Either over-the-counter preparation on a daily basis or prescription of iron sulfate 325 mg either once or twice a day depending on the patient can tolerate this. Fecal occult blood screen is negative which is reassuring, however the patient should still followup with his primary care physician for routine colonoscopy according to the guidelines.

## 2010-12-15 ENCOUNTER — Ambulatory Visit (INDEPENDENT_AMBULATORY_CARE_PROVIDER_SITE_OTHER): Payer: Medicare Other | Admitting: *Deleted

## 2010-12-15 DIAGNOSIS — I5022 Chronic systolic (congestive) heart failure: Secondary | ICD-10-CM

## 2010-12-15 DIAGNOSIS — Z95810 Presence of automatic (implantable) cardiac defibrillator: Secondary | ICD-10-CM

## 2010-12-15 DIAGNOSIS — I472 Ventricular tachycardia: Secondary | ICD-10-CM

## 2010-12-15 NOTE — Progress Notes (Signed)
icd check in clinic  

## 2010-12-19 NOTE — Letter (Signed)
June 11, 2007    Learta Codding, MD,FACC  [address]  [place], [place] [address]   RE:  [name], [name]  MRN:  [id number]  /  DOB:  25-Oct-1928   Dear Michelle Piper:   Thank you for referring [name] for EP evaluation and consideration  for ICD implantation.  As you know, he is a very pleasant 77 year old  male with a history of coronary artery disease status post bypass  surgery in 2007 with severe ischemic cardiomyopathy and EF of 30% to 35%  by echo.  He had inferoposterior and distal septal apical akinesis.  The  patient has had recurring episodes of dizziness and lightheadedness, and  a cardiac monitor was subsequently obtained, which demonstrated frequent  PVCs, nonsustained VT with post termination pauses.  The patient has  never had frank syncope, but continues to have episodes of recurrent  near syncope.  No other significant complaints.   His past medical history and physical exam have been previously  documented.  His EKG demonstrates sinus rhythm with an intraventricular  conduction delay and a prior inferior MI.   The patient's heart failure symptoms have been fairly quiescent.  The  patient states that he can walk regularly, and has no trouble, and notes  that his wife has trouble keeping up with him.  She too is fairly active  and vigorous.  All in all, I judge his heart failure as class II.   I have discussed the treatment options with [name] and his wife in  detail.  The risks, benefits, goals, and expectations of defibrillator  implantation have been recommended for the patient.  Because of his post  termination pauses, I would recommend a dual-chamber device, but his  heart failure is not severe enough to recommend a biventricular device  at this time.  With all of  the above, the risks, benefits, goals, and expectations have been  discussed with the patient, and we will plan on proceeding the earliest  possible convenient time.  I would note that down the  road he may  require antiarrhythmic drug therapy if his nonsustained arrhythmias  worsen in frequency and severity.    Sincerely,      [name], MD  Electronically Signed    GWT/MedQ  DD: 06/11/2007  DT: 06/12/2007  Job #: [id number]

## 2010-12-19 NOTE — Assessment & Plan Note (Signed)
[facility]                          [facility] OFFICE NOTE   [name], [name]                       MRN:          [id number]  DATE:06/06/2007                            DOB:          October 02, 1928    ADDENDUM:   PLAN:  1. Arrangements will also be made for the patient to have a repeat 2-D      echocardiogram, at the time of his scheduled visit with Dr. Ladona Ridgel      in [place] early next week.      Gene Serpe, PA-C  Electronically Signed      Learta Codding, MD,FACC  Electronically Signed   GS/MedQ  DD: 06/06/2007  DT: 06/07/2007  Job #: [id number]

## 2010-12-19 NOTE — Procedures (Signed)
CAROTID DUPLEX EXAM   INDICATION:  Known carotid disease.   HISTORY:  Diabetes:  Yes.  Cardiac:  CABG x3.  Hypertension:  Yes.  Smoking:  Quit smoking in 1992.  Previous Surgery:  No.  CV History:  Asymptomatic.  Amaurosis Fugax No, Paresthesias No, Hemiparesis No.                                       RIGHT             LEFT  Brachial systolic pressure:         100               108  Brachial Doppler waveforms:         Normal            Normal  Vertebral direction of flow:        Antegrade         Antegrade  DUPLEX VELOCITIES (cm/sec)  CCA peak systolic                   64                126  ECA peak systolic                   180               119  ICA peak systolic                   113               243  ICA end diastolic                   24                85  PLAQUE MORPHOLOGY:                  Calcific          Calcific  PLAQUE AMOUNT:                      Moderate          Moderate-to-severe  PLAQUE LOCATION:                    ICA/ECA           ICA/ECA   IMPRESSION:  1. Doppler velocities suggest low-end 40% to 59% stenosis in the right      internal carotid artery.  Right external carotid artery stenosis      noted.  2. Doppler velocities suggest 60% to 79% stenosis in the left internal      carotid artery.  3. Bilateral antegrade flow noted in the vertebral arteries.  4. Stable from previous examination.   ___________________________________________  V. Charlena Cross, MD   NT/MEDQ  D:  02/14/2010  T:  02/14/2010  Job:  [id number]

## 2010-12-19 NOTE — Op Note (Signed)
NAMEQUINTERRIUS, [name]                ACCOUNT NO.:  [id number]   MEDICAL RECORD NO.:  [id number]          PATIENT TYPE:  INP   LOCATION:  2005                         FACILITY:  [facility]   PHYSICIAN:  [name], MD    DATE OF BIRTH:  1929-03-24   DATE OF PROCEDURE:  06/18/2007  DATE OF DISCHARGE:  06/19/2007                               OPERATIVE REPORT   PROCEDURE PERFORMED:  Implantation of a dual-chamber implantable  cardioverter-defibrillator.   INDICATIONS:  Ischemic cardiomyopathy, class II heart failure and sinus  bradycardia with EF of 30%.   INTRODUCTION:  The patient is a very pleasant 77 year old male with a  history of coronary disease status post bypass surgery with severe LV  dysfunction who has EF of 35%.  He also has carotid vascular disease and  who is now referred for ICD implantation.   PROCEDURE:  After informed was obtained the patient was taken diagnostic  EP lab in fasting state.  After usual preparation and draping,  intravenous fentanyl and Midazolam was given for sedation.  30 mL  lidocaine was infiltrated in the left infraclavicular region.  A 7 cm  incision was carried out over this region.  Electrocautery utilized to  dissect down to the fascial plane.  The left subclavian vein was then  punctured x2 with the St. Jude model 7121 active fixation defibrillation  lead serial number [id number] advanced into the right ventricle and the  St. Jude model 1699T 52-cm active fixation pacing lead serial number  [id number] being advanced to the right atrium.  Mapping was then carried  out in the left ventricle and at the final site R-waves measured 20 mV.  Pace impedance was 635 ohms, threshold 0.5 volts at 0.5 milliseconds.  10 volts pacing in the ventricle did not stimulate the diaphragm.  With  the ventricular lead in satisfactory position, attention then turned  placement atrial lead which was placed in the anterolateral portion the  right atrium where P-waves  measured 2 mV and the pacing threshold was a  volt at 0.5 milliseconds.  The pace impedance was 528 ohms.  Again 10  volts pacing did not stimulate the diaphragm.  With both the atrial and  ventricular leads in satisfactory position, they were secured to  subpectoralis fascia with figure-of-eight silk suture.  Sewing sleeve  was also secured with silk suture.  Electrocautery utilized to make  subcutaneous pocket.  Kanamycin irrigation was utilized to irrigate the  pocket.  Electrocautery utilized to assure hemostasis.  The St. Jude  current DRRF dual chamber defibrillator serial number [id number] was  connected to the atrial and ventricular leads and placed back in the  subcutaneous pocket.  Generator secured with silk suture.  Additional  kanamycin utilized to irrigate the pocket.  Defibrillation threshold  testing was carried out.   After the patient was more deeply sedated with fentanyl and Versed, VF  was induced with T-wave shock.  A 15 joules shock was delivered to  terminate VF and restored sinus rhythm.  At this point no additional  defibrillation threshold testing was carried out and  the incision was  closed with a layer of 2-0 Vicryl followed by layer of 3-0 Vicryl  followed by layer of 4-0 Vicryl.  Benzoin was painted on skin, Steri-  Strips were applied and a pressure dressing placed.  The patient was  returned to his room in satisfactory condition.   COMPLICATIONS:  There were no immediate procedure complications.   RESULTS:  This demonstrates successful implantation of a [employer]onic dual  chamber defibrillator in a patient with an ischemic cardiomyopathy,  congestive heart failure, EF 30%, sinus bradycardia with dizziness.      [name], MD  Electronically Signed     GWT/MEDQ  D:  06/18/2007  T:  06/19/2007  Job:  [id number]   cc:   Learta Codding, MD,FACC

## 2010-12-19 NOTE — Assessment & Plan Note (Signed)
OFFICE VISIT   [name], [name]  DOB:  1928/11/16                                       08/15/2009  CHART#:[id number]   REASON FOR VISIT:  Carotid stenosis.   CHIEF COMPLAINT:  None.   PRIMARY CARE PHYSICIAN:  Dr. Margo Common.   HISTORY:  This is a 77 year old gentleman I am seeing at the request of  Dr. Andee Lineman for evaluation of carotid stenosis which is asymptomatic.  The patient recently had an ultrasound performed in November which  showed greater than 70% stenosis on the left.  The patient denies having  any symptoms.  Specifically, he denies numbness or weakness in either  extremity.  He denies slurring of his speech.  He denies amaurosis  fugax.   The patient has a history of coronary artery disease, having undergone  CABG.  His most recent Myoview was in September 2009, which showed an  ejection fraction of 31%.  He has been admitted for failure symptoms.   The patient does have a defibrillator in place.  He suffers from  diabetes, which continues to be managed medically.  He also is managed  medically for his hypertension and hypercholesterolemia.   REVIEW OF SYSTEMS:  CARDIAC:  Positive for chest pressure as well as  atrial fibrillation.  PULMONARY:  Positive for asthma.  GI:  Positive for reflux.  MUSCULOSKELETAL:  Positive for arthritis.  NEUROLOGIC:  Positive for lightheadedness.  PSYCH:  Negative.  ENT:  Positive for sore throat.  HEMATOLOGIC:  Negative.  SKIN:  Negative.  All other review of systems negative as documented on the encounter  form.   FAMILY HISTORY:  Positive for cardiovascular disease at an early age in  his father and brother.   SOCIAL HISTORY:  He is married.  He has 4 children.  He is retired.  Does not smoke.  Has a history of smoking but quit in 1992.  Does not  drink alcohol.   ALLERGIES:  None.   PHYSICAL EXAMINATION:  Heart rate 60, blood pressure 117/65, temperature  is 98.1.  generally, he is  well-appearing, in no distress.  HEENT is  negative.  Lungs are clear bilaterally.  Cardiovascular is regular rate  and rhythm.  No carotid bruits.  Abdomen is soft, nontender.  No  pulsatile mass.  Musculoskeletal:  Has had a traumatic amputation of the  right hand digits.  NEUROLOGIC:  He has had no deficits.  PSYCH:  He has  a normal affect, alert and oriented x3.  Skin is without rash or  ulceration.   DIAGNOSTIC STUDIES:  Ultrasound was repeated today.  This reveals 40% to  59% stenosis on the right.  The left has 60% to 79% stenosis.  Diastolic  velocities are 83 cm/sec.   ASSESSMENT/PLAN:  Asymptomatic left carotid stenosis.   PLAN:  Based on our repeat ultrasound today, the patient has less than  80% stenosis and for that reason, I would recommend continued medical  management of his cerebrovascular occlusive disease.  We discussed the  signs and symptoms of stroke slash/TIA in the office today.  I told him  to call 9-1-1 should these happen.  I plan on repeating his ultrasound  in 6 months.  We will perform carotid endarterectomy when his stenosis  progresses to greater than 80%.     Jorge Ny, MD  Electronically Signed   VWB/MEDQ  D:  08/15/2009  T:  08/16/2009  Job:  [id number]   cc:   Dr. Rosendo Gros, MD,FACC

## 2010-12-19 NOTE — Procedures (Signed)
CAROTID DUPLEX EXAM   INDICATION:  Known carotid artery disease.   HISTORY:  Diabetes:  Yes.  Cardiac:  CABG x3.  Hypertension:  Yes.  Smoking:  Previous.  Quit in 1992.  Previous Surgery:  No.  CV History:  No.  Amaurosis Fugax No, Paresthesias No, Hemiparesis No.                                       RIGHT             LEFT  Brachial systolic pressure:         102               108  Brachial Doppler waveforms:         WNL               WNL  Vertebral direction of flow:        Antegrade         Antegrade  DUPLEX VELOCITIES (cm/sec)  CCA peak systolic                   82                131  ECA peak systolic                   281               159  ICA peak systolic                   174               305  ICA end diastolic                   46                83  PLAQUE MORPHOLOGY:                  Calcific, heterogenous              Calcific, heterogenous  PLAQUE AMOUNT:                      Moderate          Moderate  PLAQUE LOCATION:                    ICA, ECA          ICA, ECA   IMPRESSION:  1. Right internal carotid artery suggests 40-59% stenosis.  2. Left internal carotid artery suggests 60-79% stenosis.  3. Bilateral external carotid artery stenosis.  4. Antegrade flow in bilateral vertebrals.   ___________________________________________  V. Charlena Cross, MD   CB/MEDQ  D:  08/15/2009  T:  08/15/2009  Job:  [id number]

## 2010-12-19 NOTE — Assessment & Plan Note (Signed)
[facility]                          [facility] OFFICE NOTE   [name], [name]                       MRN:          [id number]  DATE:01/02/2007                            DOB:          Dec 27, 1928    REASON FOR OFFICE VISIT:  Preoperative clearance prior to cataract  surgery.   HISTORY OF PRESENT ILLNESS:  The patient is a 77 year old male with a  history of coronary artery disease, status post coronary artery bypass  grafting in August 2007.  The patient also had concomitant left carotid  endarterectomy.  The patient was last seen in the office in October  2007, at which time amiodarone was discontinued.  He had been doing well  up until February, and presented with congestive heart failure.  He has  known LV dysfunction with an ejection fraction of 30-40% by cardiac  catheterization but this was preoperatively.  His most recent ejection  fraction is unknown.  The patient since hospital discharge in February  has been stable.  He has had no recurrent shortness of breath.  He has  no chest pain.  He has no orthopnea or PND.  He has occasional dizziness  but otherwise feels quite well.   MEDICATIONS:  1. Zetia 10 mg p.o. daily.  2. Metoprolol 25 mg p.o. daily.  3. Albuterol inhaler p.r.n.  4. __________ 40 mg p.o. daily.  5. Lisinopril 20 mg p.o. daily.  6. Simvastatin 20 mg p.o. daily.  7. Aspirin 325 mg p.o. daily.   A 12-lead electrocardiogram:  Normal sinus rhythm with an old inferior  wall myocardial infarction.   PHYSICAL EXAMINATION:  VITAL SIGNS:  Blood pressure 128/80, heart rate  73 beats per minute, weight is 178 pounds.  NECK:  Normal carotid upstrokes.  No carotid bruits.  LUNGS:  Clear breath sounds bilaterally.  HEART:  Regular rate and rhythm.  Normal S1 S2.  No murmurs, rubs, or  gallops.  ABDOMEN:  Soft, nontender, no rebound or guarding.  Good bowel sounds.  EXTREMITIES:  No cyanosis, clubbing, or edema.  NEUROLOGIC:  The patient is alert, oriented and grossly nonfocal.   PROBLEM LIST:  1. History of congestive heart failure, February 2008.  2. Severe ischemic cardiomyopathy, multivessel disease and left main      disease.      a.     Ejection fraction 30-40% by catheterization.      b.     Status post non-ST elevation myocardial infarction.      c.     Three vessel coronary artery bypass grafting with left       internal mammary artery to the left anterior descending artery,       saphenous vein graft to the circumflex, and saphenous vein graft       to the posterior descending artery, August 2007.  3. Status post critical left carotid stenosis.      a.     Status post concomitant left carotid endarterectomy.  4. Postoperative anemia.  5. Postoperative atrial fibrillation, resolved in normal sinus rhythm.  6. Dyslipidemia.  7. Remote tobacco use.  8. Hypertension, stable.   PLAN:  1. The patient  is stable from a cardiovascular perspective.  He has      had no symptoms of heart failure.  He did have a hospitalization in      February 2008.  He will need a repeat echo to assess his ejection      fraction.  2. From a presurgical workup perspective, the patient however, is      cleared and no further adjustments in medications are needed at the      present time.  3. I plan to call [facility] and clear the patient for      surgery.  A faxed report will also be sent.     Learta Codding, MD,FACC  Electronically Signed    GED/MedQ  DD: 01/02/2007  DT: 01/02/2007  Job #: [id number]

## 2010-12-19 NOTE — Procedures (Signed)
CAROTID DUPLEX EXAM   INDICATION:  Known carotid artery disease.   HISTORY:  Diabetes:  Yes.  Cardiac:  CABG x3.  Hypertension:  Yes.  Smoking:  Quit in 1992.  Previous Surgery:  No.  CV History:  Asymptomatic.  Amaurosis Fugax No, Paresthesias No, Hemiparesis No.                                       RIGHT             LEFT  Brachial systolic pressure:         110               115  Brachial Doppler waveforms:         Normal            Normal  Vertebral direction of flow:        Antegrade         Antegrade  DUPLEX VELOCITIES (cm/sec)  CCA peak systolic                   60                116  ECA peak systolic                   259               88  ICA peak systolic                   87                180  ICA end diastolic                   33                51  PLAQUE MORPHOLOGY:                  Calcific          Calcific  PLAQUE AMOUNT:                      Minimal           Moderate  PLAQUE LOCATION:                    CCA, bifurcation, ECA               CCA, bifurcation, ICA   IMPRESSION:  1. Right internal carotid artery velocities suggest 1% to 39%      stenosis.  2. Left internal carotid artery velocities suggest 40% to 59%      stenosis.  3. Velocities may be under-estimated due to calcific plaque and      acoustic shadowing.  4. Bilateral vertebral arteries are antegrade.   ___________________________________________  V. Charlena Cross, MD   EM/MEDQ  D:  08/14/2010  T:  08/14/2010  Job:  [id number]

## 2010-12-19 NOTE — Assessment & Plan Note (Signed)
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 Jude, model [phone] Current.  Date of  implant was June 18, 2007 for ischemic cardiomyopathy.   On interrogation of his device today, his battery voltage is greater  than 3.20 with a charge time of 9.3 seconds.  P waves measured 3.3  millivolts with an atrial capture threshold of 0.75 volts at 0.5  milliseconds and an atrial lead impedance of 390 ohms.  R waves measured  11.7 millivolts with a ventricular capture threshold of 1 volt at 0.5  milliseconds and a ventricular lead impedance of 400 ohms.  Shock  impedance was 39.  There were no episodes since implant date.  No  changes were made in his parameters today.  His underlying rhythm today  was a sinus rhythm at 74 beats per minute, and he is atrially pacing 66%  of the time and ventricular pacing 1.9% of the time.  He is not on  Coumadin therapy.  Mode switches total was less than 1% of the time.   He is still complaining of some lightheaded spells that he had, 3 or 4  since discharge.  No episodes showed up on the defibrillator.  Again,  his Steri-Strips were removed.  His wound is without redness or edema.  He will follow up with Dr. Ladona Ridgel in the [place] office in February.      Altha Harm, LPN  Electronically Signed      [name], MD  Electronically Signed   PO/MedQ  DD: 06/30/2007  DT: 06/30/2007  Job #: [id number]

## 2010-12-19 NOTE — H&P (Signed)
[name], [name]                ACCOUNT NO.:  [id number]   MEDICAL RECORD NO.:  [id number]         PATIENT TYPE:  AMB   LOCATION:  DAY                           FACILITY:  [facility]   PHYSICIAN:  [name], M.D. DATE OF BIRTH:  06/08/1929   DATE OF ADMISSION:  DATE OF DISCHARGE:  LH                              HISTORY & PHYSICAL   REASON FOR CONSULTATION:  Hemoccult-positive stool.   HISTORY OF PRESENT ILLNESS:  [name] is a very pleasant 77-  year-old Caucasian male sent over courtesy of Dr. Wyvonnia [name] to  further evaluate Hemoccult-positive stool.  [name] has been doing  very well from a GI standpoint.  Reflux symptoms are well-controlled on  Zegrid.  He has not had any problems with melena, rectal bleeding,  abdominal pain, etc. since I last saw him.  He underwent a CABG last  year as part of his routine health maintenance.  He returned some  Hemoccult cards and at least one reportedly was positive.  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   He has a distant history of colonic adenomas, and he was due for  surveillance in 2011.  Also it is notable he has had splenomegaly on  prior ultrasound, with normal liver enzymes.  There is a question of  diffuse hepatocellular disease on a prior ultrasound exam.  He also has  a 3 cm infrarenal AAA, seen on prior ultrasound.  He is not having any  dysphagia, odynophagia; reflux symptoms are well-controlled.  He really  has not had any abdominal pain or change in weight, per his report;  although, we do have him weighing 6 pounds less than he did on August 15, 2005.  There is no family history of colon or rectal neoplasia, or  other GI malignancy.   PAST MEDICAL HISTORY:  1. Hypertension.  2. Gastroesophageal reflux disease.  3. Hyperlipidemia.  4. History of coronary disease, status post myocardial infarction.  5. Atherectomy previously and coronary artery bypass grafting in 2007.  6. History adenomatous polyp, with negative follow-up 2006.  7. History of microscopic hematuria.  8. History of melanoma 1989.  9. History of testicle removed in 1990 with benign disease.  10.Endoscopic nasal surgery 1993.  11.Partial amputation of all 5 fingers, work-related accident      previously.   CURRENT MEDICATIONS:  1. Aspirin 325 mg daily.  2. Lisinopril/hydrochlorothiazide 20/2.5 mg daily.  3. Zetia 10 mg daily.  4. Metoprolol 100 mg 1/4 of a tablet daily.  5. Zegric 40 mg daily.  6. Simvastatin 20 mg daily.  7. Furosemide 20 mg daily.  8. Albuterol p.Dwaynen.   ALLERGIES:  NO KNOWN DRUG ALLERGIES   FAMILY HISTORY:  Mother and father died of heart disease.  Mother had  skin cancer and a history of GI malignancy.   SOCIAL HISTORY:  Married.  He has four biological children and one  stepchild.  He is retired from [employer].  He quit smoking 17 years  ago.  He quit alcohol some 15 years ago; prior to that he consumed only  on the weekends.   REVIEW OF SYSTEMS:  No recent chest pain, dyspnea or exertion.  No fever  or chills.  Little weight loss, as described above.   PHYSICAL EXAMINATION:  GENERAL:  Reveals a pleasant 77 year old  gentleman who was resting comfortably.  VITAL SIGNS:  Weight 179, height 5 foot 9 inches, temperature 97.9, BP  123/78, pulse 64.  SKIN:  Warm and dry.  There is no jaundice or stigmata of chronic liver  disease.  HEENT:  No scleral icterus.  NECK:  JVD is not prominent.  CHEST:  Lungs are clear to auscultation.  CARDIAC:  Regular rate and rhythm without murmur, gallop or rub.  ABDOMEN:  Nondistended.  Positive bowel sounds.  Soft and nontender  without appreciable mass or organomegaly.  EXTREMITIES:  No edema.  RECTAL:  Exam deferred for colonoscopy.   IMPRESSION:  [name] is a very  pleasant 77 year old gentleman;  Hemoccult positive.  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  He also may have occult liver disease with  hepatosplenomegaly seen on prior ultrasound.  He also has distant  history of melanoma.  If had an AVM in one location of the GI tract, he  may have other lesions elsewhere.  Not mentioned above from Dr.Tapper's  office on January 06, 2007 his CBC looked good, with a white count of 7000,  H&H 13.5 and 40.3, MCV 83.   I feel the best course of action would be to go ahead and offer him both  a repeat EGD and colonoscopy at this time.  Potential risks, benefits  and alternatives have been reviewed.  Depending on what is found at the  endoscopic evaluation, would consider giving a small bowel capsule study  to evaluate Hemoccult positive stool further.  Since it has been a  couple of years as far as I can tell since he had his AAA surveillance,  will go ahead and get an abdominal ultrasound to relook at his liver,  spleen and AAA.   Further recommendations to follow.   I would like to thank Dr. Wyvonnia [name] for allowing me to see this nice  gentleman once again.      [name], M.D.  Electronically Signed     RMR/MEDQ  D:  01/24/2007  T:  01/24/2007  Job:  [id number]

## 2010-12-19 NOTE — Assessment & Plan Note (Signed)
[facility]                          [place] [facility] OFFICE NOTE   [name], [name]                       MRN:          [id number]  DATE:06/06/2007                            DOB:          05/05/1929    PRIMARY CARDIOLOGIST:  Dr. Lewayne Bunting.   REASON FOR VISIT:  [name] is a very pleasant 77 year old male with  known severe ischemic cardiomyopathy, status post CABG in August 2007  with concomitant left carotid endarterectomy, who presents to the clinic  today for further evaluation of longstanding, recurrent dizziness.   Patient was last seen here in the clinic in May, by Dr. Andee Lineman, at which  time he continued to do well from a cardiovascular standpoint.  No  medication adjustments were made at that time.  He was referred for a  followup 2-D echo, however, and this showed an ejection fraction of 30%  to 35% with moderate MR and moderate, asymmetric septal hypertrophy,  inferoposterior akinesis, and distal septal/apical akinesis.  This  compared to an ejection fraction of approximately 35% in February of  this year.  An ejection fraction was not given at the time of cardiac  catheterization in August 2007, but there was mention that the entire  inferior wall was severely akinetic.   Since his last clinic visit, [name] continues to report no symptoms  suggestive of unstable angina pectoris.  In fact, he states that his  overall level of exercise tolerance has significantly improved since  undergoing revascularization surgery.   Patient also denies any sensation of tachy palpitations.  In fact, he  quite clearly recalls having had a rapid heartbeat when he was found to  have postoperative PAF.  He was treated with amiodarone, which was  continued until October 2007.   What Mr. Spivak does report is frequent episodes of near syncope and  lightheadedness, with no suggestion of vertigo.  He denies ever having  had frank syncope.  He also  denies any prodromal symptoms, or any  associated chest pain, diaphoresis, nausea, or tachy palpitations.  His  most severe episode occurred this past summer while he was driving,  which lasted about 30 seconds in duration.  Much less severe episodes,  however, seem to occur on a near daily basis.   During a recent evaluation with his primary care physician, Dr. Wyvonnia Lora, he reported these frequent spells and was referred for a 30-day  event monitor.  This has been reviewed and shows a baseline rhythm of  normal sinus with frequent PVCs and several runs of wide complex  tachycardia followed by a compensatory pause.  The patient, himself,  triggered one event where he felt like passing out, and this was  associated with documented wide complex tachycardia and frequent PVCs.   CURRENT MEDICATIONS:  1. Metoprolol ER 25 b.i.d.  2. Coated aspirin 325 mg daily.  3. Lisinopril 20 daily.  4. Furosemide 40 daily.  5. Simvastatin 20 daily.  6. Zetia 10 daily.   PHYSICAL EXAMINATION:  Blood pressure 138/70.  Pulse 65, regular.  Weight 176.  GENERAL:  A  77 year old male sitting upright in no distress.  HEENT:  Normocephalic and atraumatic.  NECK:  Palpable bilateral carotid pulses.  Loud left carotid bruit with  well-healed CEA incision.  LUNGS:  Clear to auscultation in all fields.  HEART:  Regular rate and rhythm (S1 and S2).  A soft grade 1-2/6  systolic ejection murmur at the base.  ABDOMEN:  Protuberant, nontender, intact bowel sounds.  EXTREMITIES:  Trace pedal edema.  NEUROLOGIC:  No focal deficits.   IMPRESSION:  1. Recurrent near syncope.      a.     Suspect secondary to recurrent, non-sustained ventricular       tachycardia.  2. Severe ischemic cardiomyopathy.      a.     Ejection fraction 30% to 35% by 2-D echo, June 2008.      b.     Ejection fraction 35% by 2-D echo, February 2008.      c.     Multivessel and left main disease/3-vessel coronary artery       bypass  graft, August 2007.  3. Critical left carotid artery stenosis.      a.     Status post concomitant left carotid endarterectomy.  4. Postoperative atrial fibrillation.      a.     Discharged on amiodarone, discontinued October 2007.  5. Hypertension.  6. Hyperlipidemia.  7. Remote tobacco.   PLAN:  1. Arrangements have been made between Dr. Lewayne Bunting and Dr. Lewayne Bunting for patient to present early next week for evaluation and      consideration for ICD implantation.  The patient is in agreement      with this plan, and will be contacted by Dr. Lewayne Bunting as to the      timing of next week's followup appointment.  In the meanwhile,      patient is to remain on current medication regimen and decision to      up titrate beta blocker will be deferred to Dr. Lewayne Bunting,      following placement of the defibrillator.  2. Arrangements will also be made for patient to have a repeat 2-D      echocardiogram at time of his scheduled visit with Dr. Lewayne Bunting      in [place] early next week.      Gene Serpe, PA-C  Electronically Signed      Learta Codding, MD,FACC  Electronically Signed   GS/MedQ  DD: 06/06/2007  DT: 06/07/2007  Job #: [id number]   cc:   Wyvonnia Lora

## 2010-12-19 NOTE — Assessment & Plan Note (Signed)
[facility]                         ELECTROPHYSIOLOGY OFFICE NOTE   GIOVONI, BUNCH                       MRN:          [id number]  DATE:06/11/2007                            DOB:          1929-05-03    [name] is referred today by Mr. Lewayne Bunting for consideration for ICD  implantation.  He is a very pleasant 77 year old man with known coronary  disease status post bypass surgery in 2007.  Postoperatively, he has had  severe LV dysfunction with an EF of 30%.  The patient recently over the  last several weeks has had increasingly frequent episodes of dizzy  spells and near syncope without frank syncope.  He notes that at times  he feels his heart beating fast and then he will get dizzy and  lightheaded and nearly, but not ever completely, passing out.  He denies  worsening chest pain or other symptoms.  He has not had frank syncope.   MEDICATIONS:  1. Zetia 10 a day.  2. Simvastatin 20 a day.  3. Metoprolol ER 25 a day.  4. Lisinopril 20 a day.  5. Furosemide 40 a day.  6. Aspirin 325 a day.   FAMILY HISTORY:  Notable for both parents being deceased and positive  for coronary disease.   SOCIAL HISTORY:  The patient is married, he is retired.  He has a  history of tobacco abuse but stopped smoking 15 years ago.  He denies  alcohol abuse.  He drinks one caffeinated beverage a day.   REVIEW OF SYSTEMS:  Notable for some generalized fatigue.  He also has  diabetes, a history of emphysema and gastroesophageal reflux symptoms.  He also notes seasonal allergic rhinitis symptoms.  Otherwise, all  systems reviewed and found to be negative except as noted in the HPI.   PHYSICAL EXAMINATION:  He is a pleasant, well-appearing man in no acute  distress.  The blood pressure today was 138/75, the pulse was 68 and  regular, respirations were 18, the weight was 176 pounds.  HEENT EXAM:  Normocephalic and atraumatic.  Pupils are equal and round.  OROPHARYNX:  Moist.  SCLERA:  Anicteric.  NECK:  No jugular venous distention.  There is no thyromegaly.  TRACHEA:  Midline.  CAROTIDS:  Are 2+ and symmetric.  LUNGS:  Clear bilaterally to auscultation.  There are no wheezes, rales  or rhonchi present.  There is no increased worker breathing.  CARDIOVASCULAR EXAM:  A regular rate and rhythm with normal S1 and S2.  There are no murmurs, rubs or gallops present.  The PMI was enlarged and  laterally displaced.  ABDOMINAL EXAM:  Soft, nontender, nondistended.  There was no  organomegaly present.  Bowel sounds were present.  There was no rebound  or guarding.  EXTREMITIES:  No clubbing, cyanosis or edema.  The pulses were 2+ and  symmetric.  NEUROLOGIC EXAM:  Alert and oriented x3 with cranial nerves intact, the  strength was 5/5 and symmetric.   The EKG demonstrates sinus rhythm with an intraventricular conduction  delay, a normal PR and a QRS duration of 126  milliseconds.   IMPRESSION:  1. Symptomatic nonsustained ventricular tachycardia.  2. Symptomatic post termination pauses.  3. Ischemic cardiomyopathy.  4. Class II congestive heart failure.   DISCUSSION:  The treatment options for Mr. Keim have been discussed.  The risks, benefits, goals and expectations of dual chamber ICD  implantation have been discussed with the patient.  Once the device is  placed I would anticipate up titration of his beta-blockers initially  with antiarrhythmic drug therapy utilized only if he has recurrent  episodes of near syncope.     [name], MD  Electronically Signed    GWT/MedQ  DD: 06/11/2007  DT: 06/12/2007  Job #: [id number]   cc:   Learta Codding, MD,FACC  Wyvonnia Lora

## 2010-12-19 NOTE — Op Note (Signed)
[name], [name]                ACCOUNT NO.:  [id number]   MEDICAL RECORD NO.:  [id number]          PATIENT TYPE:  AMB   LOCATION:  DAY                           FACILITY:  [facility]   PHYSICIAN:  [name], M.D. DATE OF BIRTH:  10/10/1928   DATE OF PROCEDURE:  02/17/2007  DATE OF DISCHARGE:                               OPERATIVE REPORT   Esophagogastroduodenoscopy with biopsy followed by colonoscopy.   INDICATIONS FOR PROCEDURE:  A 77-year gentleman found to be Hemoccult  positive recently.  He has a distant history of colonic adenomas,  history of ulcerative reflux esophagitis, history of bleeding a cecal  AVM ablated at colonoscopy in 2007, distant history of melanoma.  CBC  through Dr. Jackolyn [name] office recently looked entirely normal.  EGD and  colonoscopy are now being done.  This approach has been discussed at  length.  Potential risks, benefits and alternatives was have been  reviewed, questions answered.  He is agreeable.  Please see  documentation in the medical record.   PROCEDURE NOTE:  O2 saturation, blood pressure, pulse and respirations  were monitored throughout the entire procedure.  Conscious sedation  Versed 3 mg IV, Demerol 100mg  IV in divided doses.   INSTRUMENTATION:  Pentax video chip system.   FINDINGS:  Esophagogastroduodenoscopy:  Examination of the tubular  esophagus revealed multiple 1- to 2-mm pale raised lesions consistent  with squamous papillomas.  Otherwise, there were no varices, no evidence  of esophagitis.  EG junction easily traversed.  Also please note in the  hypopharynx, there was a 3- to 4-mm similarly appearing lesion just  above the vocal cords; please see photos.   Stomach:  Gastric cavity was emptied and insufflated well with air.  Thorough examination of the gastric mucosa including retroflexed view of  the proximal stomach and esophagogastric junction demonstrated only a  small hiatal hernia.  Pylorus was patent, easily  traversed.  Examination  of the bulb and second portion revealed no abnormalities.   THERAPEUTIC/DIAGNOSTIC MANEUVERS PERFORMED:  One of the lesions in the  esophagus was biopsied for histologic study.  The patient tolerated the  procedure well and was prepared for colonoscopy.  Digital rectal exam  revealed no abnormalities.   ENDOSCOPIC FINDINGS:  Prep was adequate.   Colon:  Colonic mucosa was surveyed from the rectosigmoid junction  through the left, transverse and right colon to the appendiceal orifice,  ileocecal valve and cecum.  These structures were well seen and  photographed for the record.  From this level, the scope was slowly and  cautiously withdrawn.  All previously mentioned mucosal surfaces were  again seen.  The patient had scattered pan colonic diverticula.  The  remainder of the colonic mucosa appeared normal.  Scope was pulled down  in the rectum where thorough examination of rectal mucosa including  retroflexed view of the anal verge demonstrated minimal internal  hemorrhoids.  The patient tolerated both procedures well and was  reactive to endoscopy.   IMPRESSION:  A 3-mm lesion in the hypopharynx just above the focal cords  (query papilloma).  Multiple 1- to  2-mm raised pale nodules throughout  the esophagus, most likely papillomas; one biopsied.  Otherwise normal  esophagus.  Small hiatal hernia.  Otherwise normal stomach.  Normal D1-  D2.   Colonoscopy findings:  Minimal internal hemorrhoids.  Otherwise normal  rectum.  Scattered pan colonic diverticula.  The remainder of the  colonic mucosa appeared normal.   Today's findings are reassuring.  He had a normal CBC approximately 5  weeks ago.   RECOMMENDATIONS:  1. Repeat CBC with differential today.  If CBC was normal, would not      work up further.  I would recommend he come back in 5 years for      follow-up colonoscopy.  2. Follow-up on pathology for esophageal biopsy today.  3. The lesion in his  hypopharynx, most likely I feel will end up being      a papilloma, but the largest of the lesions seen was in the      hypopharynx, and therefore, I feel he ought to see an      otolaryngologist to make sure all the bases are covered regarding      this lesion.      [name], M.D.  Electronically Signed     RMR/MEDQ  D:  02/17/2007  T:  02/17/2007  Job:  [id number]   cc:   [name]  Fax: [phone]   [name], M.D.  [address]  [place]  [place] [address]

## 2010-12-19 NOTE — Discharge Summary (Signed)
[name], [name]                ACCOUNT NO.:  [id number]   MEDICAL RECORD NO.:  [id number]          PATIENT TYPE:  OBV   LOCATION:  2807                         FACILITY:  [facility]   PHYSICIAN:  Maple Mirza, PA   DATE OF BIRTH:  [birth year]   DATE OF ADMISSION:  06/18/2007  DATE OF DISCHARGE:  06/19/2007                               DISCHARGE SUMMARY   ALLERGIES:  NO KNOWN DRUG ALLERGIES.   TIME OF EXAMINATION AND DICTATION:  Greater than 35 minutes for examine  and dictation.   FINAL DIAGNOSES:  1. Discharging day #1, status post implant of a St. Jude CURRENT DR RF      dual-chamber cardioverter defibrillator.  2. Three-vessel coronary artery disease status post coronary artery      bypass graft surgery 2000, history of inferior myocardial      infarction.  3. Recurrent presyncope.  4. NSVT with post termination pauses.  5. Ischemic cardiomyopathy, ejection fraction 30-35%.  6. New York Heart Association class II chronic, systolic congestive      heart failure.   SECONDARY DIAGNOSES:  1. Extracranial cerebrovascular occlusive disease status post left      carotid endarterectomy.  2. Hypertension.  3. Dyslipidemia.   PROCEDURE:  June 18, 2007, implant St. Jude CURRENT DR RF dual-  chamber cardioverter defibrillator with defibrillator threshold study  equal to less than 15 joules, Dr. Lewayne Bunting.  The patient had no post  procedural complications.  No hematoma at the pocket, and chest x-ray  shows that the leads in the right ventricle and the right atrium are in  appropriate position.  Device has been interrogated and all values are  within normal limits.   BRIEF HISTORY:  [name] is a 77 year old male.  He was referred by Dr.  Andee Lineman for ICD implantation.  He has a history of coronary artery  disease.  He is status post bypass 2007.  He has this is severe ischemic  cardiomyopathy with ejection fraction 30-35%.  In the past, he has had  inferoposterior and  septal myocardial infarction.  He has a history of  nonsustained VT with post termination pauses.  As far as symptoms are  concerned, he has never had frank syncope, but he does have recurrent  presyncope.  The patient's heart failure symptoms are fairly modest, he  can walk regularly, has no trouble, his heart failure class is II.   The benefits and risks of cardioverter defibrillator implantation have  been discussed with the patient.  He wishes to proceed.   HOSPITAL COURSE:  The patient presented electively June 18, 2007.  He underwent implantation of St. Jude device the same day by Dr. Ladona Ridgel  with no post procedural complications, discharging postprocedure day #1  with the admonition to keep his incision dry for next 7 days, to sponge  bathe until Wednesday, November 19.  Mobility of the left arm has also  been described with the patient.   MEDICATIONS AT DISCHARGE.:  1. Zetia 10 mg daily.  2. Simvastatin 20 mg every evening.  3. Metoprolol  ER 25 mg daily.  4. Lisinopril 20 mg daily.  5. Lasix 40 mg daily.  6. Enteric-coated aspirin 325 mg daily.  7. Prilosec over-the-counter daily.  8. Tylenol for pain.   FOLLOWUP:  He has follow-up appointment at [facility], [address] [place] [facility] ICD clinic Monday, November 24 at  9:40.  He will see Dr. Ladona Ridgel February 2009, Dr. Lubertha Basque office will  call.  Laboratory studies pertinent to this admission were taken  November 6.  White cells 7.5, hemoglobin 13, hematocrit 37.4, platelets  177, protime 10.9, INR 0.8, sodium 143, potassium 4.5, chloride 107,  carbonate 29, glucose 142, BUN is 15, creatinine 1.1.      Maple Mirza, PA     GM/MEDQ  D:  06/19/2007  T:  06/20/2007  Job:  [id number]   cc:   [name], MD  Learta Codding, MD,FACC  Wyvonnia Lora

## 2010-12-19 NOTE — Assessment & Plan Note (Signed)
[facility]                         ELECTROPHYSIOLOGY OFFICE NOTE   CHERRY, WITTWER                       MRN:          [id number]  DATE:06/09/2008                            DOB:          Jul 31, 1929    [name] returns today for a followup.  He is a very pleasant elderly  male with an ischemic cardiomyopathy, congestive heart failure, status  post ICD insertion and returns today for followup.  He also has sinus  node dysfunction.  The patient was hospitalized several months ago, it  sounds like he had bronchitis and some sort of upper respiratory  problems, this is improved.  He had seen my partner, DeGent at that  time.  He returns today for followup.  He had no specific complaints,  otherwise denying chest pain, shortness of breath, or peripheral edema.  He has had no intercurrent IC therapies.   MEDICATIONS:  1. Zetia 10 a day.  2. Simvastatin 40 a day.  3. Furosemide now 20 a day.  4. Aspirin 325 a day.  5. Prilosec OTC 20 a day.  6. Diovan 80 a day.  7. Carvedilol 3.125 daily.  8. Advair daily.  9. Fluticasone 2 sprays daily.   PHYSICAL EXAMINATION:  GENERAL:  He is a pleasant well-appearing elderly  man, in no acute distress.  VITAL SIGNS:  Blood pressure today was 130/80, the pulse 64 and regular,  respirations were 18, and the weight was 178 pounds.  NECK:  No jugular  venous distention.  LUNGS:  Clear bilaterally to auscultation.  No wheezes, rales, or  rhonchi are present.  CARDIOVASCULAR:  Regular rate and rhythm.  Normal S1 and S2.  EXTREMITIES:  Demonstrated no edema.   Interrogation of his defibrillator demonstrates a St. Jude current, the  R-waves were 11, the P-waves 2, the impedance 330 in the A and 390 in  the V, the threshold 0.75 at 0.5 both in the right atrium and the right  ventricle.  Battery voltage was greater than 3.2 volts.  There are no  intercurrent IC therapies.  He was pacing in the atrium 60% of the  time.   IMPRESSION:  1. Ischemic cardiomyopathy.  2. Congestive heart failure.  3. Status post implantable cardioverter-defibrillator insertion.  4. Sinus bradycardia.   DISCUSSION:  Overall, [name] is stable.  His heart failure is well  controlled.  His defibrillator is working very nicely.  He tells me that  he recently received a flu vaccination.  We will plan to  see the patient back for ICD followup in 3-4 months and I will see him  back in 1 year.  I have encouraged him to maintain a low-sodium diet and  take his medications as directed.     [name], MD  Electronically Signed    GWT/MedQ  DD: 06/09/2008  DT: 06/09/2008  Job #: [id number]   cc:   Wyvonnia Lora

## 2010-12-19 NOTE — Assessment & Plan Note (Signed)
[facility]                         ELECTROPHYSIOLOGY OFFICE NOTE   [name]                       MRN:          [id number]  DATE:09/16/2007                            DOB:          1928/09/13    [name] returns in follow-up.  He is a very pleasant elderly male with  an ischemic cardiomyopathy and sinus bradycardia and congestive heart  failure who underwent ICD implantation back in November.  He returns  today for follow-up.  Overall he has been stable.  He does note that he  has had dizziness.  When I saw him back in November he was complaining  of shortness of breath which has resolved and now he is somewhat dizzy.  This is not every day and it is not all the time, but typically occurs  in the mornings.   MEDICATIONS:  1. Zetia 10 a day  2. Simvastatin 20 a day  3. Metoprolol ER 25 a day  4. Lisinopril 20 a day  5. Furosemide 40 a day  6. Aspirin 325 a day.   On physical exam he is a pleasant well-appearing elderly man in no  distress.  Blood pressure today was 131/71, the pulse 74 and regular,  respirations were 18.  Weight was 178 pounds  NECK:  Revealed no jugular venous distention.  LUNGS:  Clear bilaterally to auscultation.  No wheezes, rales or rhonchi  are present.  CARDIOVASCULAR:  Exam regular rate and rhythm.  Normal S1-  S2.  EXTREMITIES:  Demonstrate no edema.   Interrogation his defibrillator demonstrates a St. Jude Current [phone]  RF device.  The P and R waves were 4 and 11, the impedance 380 in the  atrium, 440 in the ventricle.  Threshold less than 0.5 at 0.5 in the A  and 1 at 0.5 in V.  The battery which was __________  volts.  There are  no intercurrent IC therapies.  60% A paced.  Today we turned his outputs  down 2 at 0.5 in the atrium and 2.5 at 0.5 in the ventricle.   IMPRESSION:  1. Ischemic cardiomyopathy.  2. Congestive heart failure.  3. Status post ICD insertion.  4. Problems with orthostatic  dizziness.   DISCUSSION:  I have asked [name] to decrease his Lasix to 1/2 tablet  daily.  He is instructed to call us if his dizziness is not at all  improved.     [name], MD  Electronically Signed    GWT/MedQ  DD: 09/16/2007  DT: 09/17/2007  Job #: [id number]   cc:   Wyvonnia Lora

## 2010-12-22 NOTE — Cardiovascular Report (Signed)
NAMEEDGARD, DEBORD                ACCOUNT NO.:  [id number]   MEDICAL RECORD NO.:  [id number]          PATIENT TYPE:  OIB   LOCATION:  1966                         FACILITY:  [facility]   PHYSICIAN:  Veverly Fells. Excell Seltzer, MD  DATE OF BIRTH:  May 21, 1929   DATE OF PROCEDURE:  04/03/2006  DATE OF DISCHARGE:                              CARDIAC CATHETERIZATION   OPERATION/PROCEDURE:  1. Left heart catheterization.  2. Coronary angiogram.  3. Left ventricular angiogram.  4. Left internal mammary artery angiogram.   INDICATIONS:  Mr. Strine had a recent hospitalization at [facility]  for bronchitis and was found to have a small, non ST elevation myocardial  infarction during his hospital course.  He underwent a CT scan of the chest  that demonstrated heavy calcification in the area of the left main as well  as the ostial right coronary artery.  He has findings consistent with  vascular disease as well.  He underwent a carotid duplex study that  demonstrated critical stenosis of the left internal carotid artery at that  time.  He was referred for cardiac catheterization to evaluate his coronary  anatomy in the setting of all of the above.   ACCESS:  Right femoral artery.   COMPLICATIONS:  None.   DESCRIPTION OF PROCEDURE:  The procedural risks and indications were  explained to the patient and his family who fully understood.  Informed  consent was obtained. The right groin was prepped, draped and anesthetized  with 1% lidocaine in normal sterile fashion.  Arterial access was achieved  using the modified Seldinger technique.  A 4-French arterial sheath was  placed.  The catheters included a 4-French pigtail, JL-4, free DRC, and a  LIMA catheter.  All catheter exchanges were performed over a guidewire.  At  the conclusion of the case, the sheath was pulled and manual pressure was  used for hemostasis.   FINDINGS:  Aortic pressure was 101/56 with a mean of 74.  Left ventricular  pressure was 109/6 with an end-diastolic pressure of 8.   CORONARY ANATOMY:  The left main stem is very heavily calcified.  There is a  significant ostial left main lesion with pressure dampening even with a 4-  [place] catheter.  The ostial left main stem angiographically is 70-75%  stenosed.   The distal left main stem also has mild disease with approximate 25%  stenosis in that region.   The left main bifurcates into the circumflex and LAD.  Only a few  angiographic views were taken in the left coronary system and the presence  of his significant ostial left main disease.  The left circumflex gives off  a medium caliber obtuse marginal branch that appears free of any significant  disease.  The remaining circumflex courses in the AV groove and does not  have any significant disease.  The proximal LAD appears to have minor  luminal irregularities but no significant areas of stenosis.  The LAD gives  off a proximal diagonal branch that appears free of disease.  The remaining  LAD through the mid and distal segments have no  significant disease  identified.   The right coronary artery is heavily calcified at the ostium and throughout  the proximal segments.  In the mid right coronary artery, there appears to  be an area of ulceration that also looks like the site of a chronic  occlusion.  However, the right coronary artery continues to course down  beyond this and give off a large PDA as well as two large posterolateral  branches.  In the area described at the mid right coronary artery, there is  a 70% stenosis present.  The PDA has no significant disease.  The posterior  AV segment has 25% stenosis in its proximal portion.  In the first  posterolateral branch, it has serial 50-75% stenoses.   The left ventriculogram demonstrates normal function of the basal anterior  wall.  The mid anterolateral wall is hypokinetic.  The entire inferior wall  is severely akinetic with only the base  being severely hypokinetic.   A selective LIMA angiogram was performed with anticipation of a surgical  consult and LIMA appears suitable for a vascular conduit.   ASSESSMENT:  1. Left main stem disease.  2. Right coronary artery disease with probable prior occlusion based on      akinetic inferior wall.  3. Severe segmental left ventricular dysfunction as described above.   Mr. Seabrook is currently asymptomatic as he presented today as an outpatient.  However, his anatomy is very high risk with ostial left main disease and  severe left ventricular dysfunction associated.  We will admit him to the  hospital and obtain a cardiovascular surgery consultation.  His disease in  not amenable to percutaneous coronary intervention unless he refuses surgery  or is not found to be an acceptable candidate.  Of note, he does have high-  grade left carotid arterial disease which may require further evaluation.      Veverly Fells. Excell Seltzer, MD  Electronically Signed     MDC/MEDQ  D:  04/03/2006  T:  04/04/2006  Job:  [id number]   cc:   Learta Codding, MD,FACC

## 2010-12-22 NOTE — Discharge Summary (Signed)
[name], [name]                ACCOUNT NO.:  [id number]   MEDICAL RECORD NO.:  [id number]          PATIENT TYPE:  INP   LOCATION:  2039                         FACILITY:  [facility]   PHYSICIAN:  Kerin Perna, M.D.  DATE OF BIRTH:  1928/08/15   DATE OF ADMISSION:  04/03/2006  DATE OF DISCHARGE:  04/09/2006                                 DISCHARGE SUMMARY   ADMISSION DIAGNOSES:  1. Class IV unstable angina with severe three-vessel coronary artery      disease and reduced left ventricular function.  2. Asymptomatic critical left carotid stenosis.   SECONDARY DIAGNOSES:  1. Class IV unstable angina with severe three-vessel coronary artery      disease and reduced left ventricular function, status post coronary      artery bypass graft.  2. Asymptomatic critical left carotid stenosis, status post left carotid      endarterectomy.  3. Postoperative atrial fibrillation, currently in sinus rhythm.  4. Chronic obstructive pulmonary disease, quit smoking 15 years ago.  5. Status post myocardial infarction in 1992.  6. Hypertension.  7. Hyperlipidemia.  8. Gastroesophageal reflux disease.  9. History of cecal arteriovenous malformation with guaiac-positive      stools, treated with colonoscopy and cauterization in 2005.  10.NO KNOWN DRUG ALLERGIES.  11.Postoperative hyperglycemia with mildly elevated hemoglobin A1c of 6.3.  12.Mild postoperative hypokalemia, supplemented.  13.Postoperative acute blood loss anemia requiring transfusion.   PROCEDURES:  1. Left carotid endarterectomy with Dacron patch angioplasty by Dr.      Waverly [name], on April 04, 2006.  2. Coronary artery bypass grafting x3 using left internal mammary artery      to the left anterior descending, saphenous vein graft to the circumflex      marginal, saphenous vein graft to the posterior descending by [name], April 04, 2006.  3. April 04, 2006, intraoperative transesophageal  echocardiogram by Dr.      Ester [name].  Dictated report for details.  4. April 03, 2006, cardiac catheterization by [name] with      results discussed previously.  5. April 03, 2006, carotid duplex showing no internal carotid artery      stenosis on the right with antegrade vertebral flow bilaterally.  Left      had greater than 80% internal carotid artery stenosis.  There was mild      plaque throughout on the right.  Left had severe calcific plaque in the      internal carotid artery.   BRIEF HISTORY:  [name] is a 77 year old white male with previous smoking  history and previous myocardial infarction.  He was recently admitted to  [facility] in mid August for acute bronchitis, for which he received  antibiotics and steroids.  Apparently, during that admission, he had  elevated troponin levels and a cardiology consult was requested.  He was  evaluated by [name], who felt that he was not currently having acute  coronary syndrome, but did feel that, due to his risk factors, he did need  further  cardiac workup on outpatient basis.  He was also noted to have  carotid bruits on examination, and plans were made to order carotid duplex.  EKG showed no ischemic changes and a 2-D echocardiogram, however, showed  significant reduction in LV function with EF of 30% and 2+ mitral  regurgitation.  His BNP was mildly elevated at 216.  Once his pulmonary  symptoms resolved, he was set up for a cardiac catheterization, which was  done April 03, 2006, showing 70-80% left main stenosis, 80% stenosis of the  right coronary artery, EF of 30-40% with LVEDP of 10 mmHg.  Because of his  coronary anatomy, a cardiac surgery consultation was requested, and he was  evaluated by [name] who felt he would benefit from coronary  artery bypass graft surgery.  He was felt stable post catheterization and,  therefore, underwent pre-CABG Dopplers, including a carotid duplex  which did  demonstrate greater than 80% stenosis on the left, but no internal carotid  artery stenosis on the right.  Due to the finding of left carotid artery  stenosis, vascular surgery was consulted and he was evaluated by [name], who recommended combined left carotid endarterectomy part  with his coronary artery bypass graft surgery.  Due to scheduling conflicts,  [name] was unable to perform this himself, but made arrangements that one  of his partners, [name], perform this.  He was in  anticipation for the surgery on April 04, 2006.   HOSPITAL COURSE:  [name] was admitted to [facility] on April 03, 2006.  As stated above, he was found to have coronary artery disease and  left internal carotid artery stenosis, and was set up for combined coronary  artery bypass graft surgery and left carotid endarterectomy on April 04, 2006.  These were done as anticipated.  Postoperatively, he was transferred  to the surgical intensive care unit and remained there for the first two  days postop.  He was extubated, neurologically intact.  He had no  postoperative bleeding complications and chest tubes were discontinued in  usual fashion.  He did require low-dose dopamine initially, but was soon  weaned from this as well.  Postoperative blood sugars were slightly elevated  at over 170.  His hemoglobin A1c preoperatively was slightly elevated at  6.3, and he was started on Lantus insulin as well as NovoLog sliding scale.  Once he began taking p.o. intake, he was started on a carbohydrate modified  diet and the diabetes educator was ordered to arrange outpatient diabetes  education.  On postoperative day #2, [name] was off any IV drips.  He  remained stable and felt appropriate for discharge from the surgical  intensive care unit out to the floor.  Shortly thereafter he did develop atrial fibrillation, which was treated with IV Amiodarone.  He  converted  back to sinus rhythm within several hours.  Of note, during the time that he  had atrial fibrillation, with his heart rate up to 140, [name] did  complain of chest tightness which seemed more pleuritic in nature.  There  were no acute ST changes per EKG.  The pain appeared to improve as his rate  was better controlled, and when in sinus rhythm he was feeling much better.  Once on unit [name] made good progress.  He was ultimately weaned from  supplemental oxygen and saturating above 90% on room air.  He remained  afebrile.  At that time the patient he has maintained sinus rhythm with a  heart rate in the mid 60s to 80s.  He has been tolerating a low-dose ACE  inhibitor and beta blocker with blood pressure around 105/60.  He has been  maintained on a low-dose diuretic therapy for mild postoperative fluid  volume excess and has had a good response.  His bowels and bladder have been  functioning appropriately.  He has been ambulating in the hallways with  cardiac rehab nursing staff.  His sugars have been stable and, in fact,  within the last 24 hours they have all been 125 or less, and his Lantus  insulin has since been discontinued.  We will monitor sugars, but anticipate  that he will go home on no diabetes medications, but with plans for him to  follow-up with his primary physician, [name].  Of note, he did require  one blood transfusion for hemoglobin and hematocrit of 7.8 and 22.4  respectively, with follow-up H&H of 9.4 and 27.5.  His renal function  remained stable with a creatinine of 1.0.  He did require some  supplementation for mild hypokalemia and potassium of 3.3.  Neurologically,  he has remained intact.  He is denied any dysphagia from his carotid  surgery.  On exam his heart has a regular rate and rhythm.  Lungs sounds are  clear.  Abdominal exam was benign.  Extremities showed no edema.  His  incisions are healing well without signs of infection.  His  most recent  chest x-ray, on April 07, 2006, showed tiny left apical pneumothorax,  which had remained stable, and small bilateral effusions and bibasilar  atelectasis, which also remained stable.  If [name] continues to make  good progress and maintains sinus rhythm, it is anticipated he will be ready  for discharge home on postop day #5, April 09, 2006.   LABORATORY DATA:  A CBC and BMET are still pending at time of dictation, but  most recent labs show a sodium of 137, potassium 3.3, which was  supplemented, chloride of 103, CO2 of 29, blood glucose 87, BUN 13,  creatinine 1.0, calcium 8.1.  White blood count of 12.7 and trending down.  Post-transfusion hemoglobin and hematocrit of 9.4 and 27.5 respectively.  Platelet count 173.  Hemoglobin A1c of 6.3.  Liver function test showing a  total bilirubin of 0.7, alkaline phosphatase 54, AST of 18, ALT 21, total  protein of 6.0, blood albumin 3.3.   DISCHARGE MEDICATIONS: 1. Coated aspirin 325 mg p.o. q. day.  2. Toprol XL 25 mg p.o. q. day.  3. Lisinopril 10 mg p.o. q. day.  4. Zocor 20 mg p.o. q.p.m.  5. Spiriva 18 mcg one dose inhaled daily.  6. Zetia 10 mg p.o. daily.  7. Lasix 20 mg p.o. daily times 1 week.  8. Potassium chloride 20 mEq daily x 1 week.  9. Amiodarone 200 mg 2 tablets p.o. b.i.d. times 1 week, then decrease to      1 tablet p.o. b.i.d.  10.Oxycodone 5 mg, 1-2 tablets p.o. q.4 h p.r.n. pain.   DISCHARGE INSTRUCTIONS:  He was instructed to continue daily walking and  breathing exercises and avoid driving or heavy lifting more than 10 pounds.  He may shower and clean his incisions gently with soap and water.  He should  notify the CVTS office if he develops fever greater than 101 or redness or  drainage from his incision site.  He is to follow a low-fat, low-salt diet,  as well as carbohydrate modification for his probable glucose intolerance  versus new onset diabetes.  We have asked the diabetes educator  to range of  diabetes education outpatient follow-up.  He was also asked to schedule a  follow-up appoint with [name] to reevaluate his sugars, and he should  call and schedule a two week follow up with [name] Lineman, and had a chest x-  ray at this appointment.  He was see Dr. Donata Clay in the CVTS office in  approximately 3 weeks.  Our office will contact him with a specific  appointment date and time.      Jerold Coombe, P.A.      Kerin Perna, M.D.  Electronically Signed    AWZ/MEDQ  D:  04/08/2006  T:  04/08/2006  Job:  [id number]   cc:   Kerin Perna, M.D.  CVTS Office  Learta Codding, MD,FACC  Wyvonnia Lora

## 2010-12-22 NOTE — Op Note (Signed)
NAMEREMBERT, BROWE                ACCOUNT NO.:  [id number]   MEDICAL RECORD NO.:  [id number]          PATIENT TYPE:  INP   LOCATION:  2316                         FACILITY:  [facility]   PHYSICIAN:  Di Kindle. Edilia Bo, M.D.DATE OF BIRTH:  August 12, 1928   DATE OF PROCEDURE:  04/04/2006  DATE OF DISCHARGE:                                 OPERATIVE REPORT   PREOPERATIVE DIAGNOSIS:  Symptomatic coronary artery disease and  asymptomatic critical left carotid stenosis   POSTOPERATIVE DIAGNOSIS:  Symptomatic coronary artery disease and  asymptomatic critical left carotid stenosis.   PROCEDURE:  Left carotid endarterectomy.  (Coronary artery bypass surgery is  dictated separately by Dr. Donata Clay).   SURGEON:  Dr. Edilia Bo.   ASSISTANT:  Dr. Donata Clay and Coral Ceo.   ANESTHESIA:  General.   INDICATIONS:  This is a 77 year old gentleman who presented with symptomatic  coronary disease.  Preoperative carotid duplex scan suggested a high-grade  left carotid stenosis, and this was confirmed by MRA.  Combined left carotid  endarterectomy with a CABG was recommended in order to lower his risk of  future stroke and perioperative stroke.  The procedure and potential  complications had been discussed with the patient preoperatively.  All of  his questions are answered, and he was agreeable to proceed.   TECHNIQUE:  The patient was taken to the operating room and received a  general anesthetic.  The patient was prepped for combined left carotid  endarterectomy and CABG.  An incision was made along the anterior border of  the sternocleidomastoid and dissection carried down to the common carotid  artery which was dissected free and controlled with a Rumel tourniquet.  The  facial vein was divided between 2-0 silk ties.  The internal carotid artery  above the plaque was controlled.  Of note, the artery was rotated somewhat  such that the internal carotid artery was directly posterior almost  medially.  The external carotid arteries controlled with a blue vessel loop.  The patient was heparinized.  Clamps were then placed on the internal then  the common and the external carotid artery.  A longitudinal arteriotomy was  made in the common carotid artery, and this was extended through the plaque  into the internal carotid artery above the plaque.  A 12 shunt was placed  into the internal carotid artery, back-bled, and then placed into the common  carotid artery and secured with a Rumel tourniquet.  Flow was reestablished  through the shunt.  An endarterectomy plane was established proximally, and  the plaque was sharply divided.  Eversion endarterectomy was performed of  the external carotid artery.  Distally, there was a nice taper in the  plaque.  One tacking suture was required.  A Dacron patch was then sewn  using continuous 6-0 Prolene suture.  Prior to completing the patch closure,  the shunt was removed.  The artery was back-bled and flushed appropriately  and the anastomosis completed.  Flow was reestablished first to the external  carotid artery and then to the internal carotid  artery.  At the completion, there was  a good pulse distal to the patch and a  good Doppler signal.  Hemostasis was obtained in the wound.  The wound was  to be closed at the end of the procedure after coronary artery bypass  surgery.      Di Kindle. Edilia Bo, M.D.  Electronically Signed     CSD/MEDQ  D:  04/04/2006  T:  04/04/2006  Job:  [id number]

## 2010-12-22 NOTE — Assessment & Plan Note (Signed)
[facility]                            [facility] OFFICE NOTE   [name], [name]                       MRN:          [id number]  DATE:05/31/2006                            DOB:          [birth year]    PRIMARY CARDIOLOGIST:  Dr. Lewayne Bunting.   REASON FOR OFFICE VISIT:  Scheduled 1 month follow up.   Please refer to Dr. Margarita Mail office note of April 29, 2006, for full  details.   Patient returns for scheduled 1 month follow up, after being seen here for  initial post hospital follow up by Dr. Andee Lineman 1 month ago.  At that time,  patient was doing well from a cardiovascular standpoint with no complaint of  tachy palpitations.  He was still on amiodarone and this was decreased to  200 mg daily (x4 weeks).  Patient has since stopped this medication.  He  also had extensive blood work, for follow up of significant postop anemia,  and was placed on supplemental iron.  Labs, however, indicated normalization  of hemoglobin with a 12 level.  Patient has since stopped the supplemental  iron as well.   Patient continues to do well, is tolerating his medications, is ambulating  without any exertional chest discomfort or dyspnea.  He notes no symptoms  suggestive of decompensated heart failure.  His only complaint is that of  some postural presyncope shortly after he starts walking and having gotten  up from a seated position.  He has not had any frank syncope.  He denies any  associated tachy palpitations.   CURRENT MEDICATIONS:  1. Aspirin 325 daily.  2. Metoprolol ER 25 b.i.d.  3. Simvastatin 20 daily.  4. Lisinopril 10 daily.  5. Zetia 10 daily.   PHYSICAL EXAM:  Orthostatic blood pressure __________ 136/72, pulse 64  supine; 135/78, pulse 60 sitting; 128/76, pulse 68 standing.  Weight 176.  GENERAL:  A 77 year old male sitting upright in no distress.  NECK:  Palpable bilateral carotid pulses without bruits; well healed left  neck  incision.  LUNGS:  Clear to auscultation in all fields.  HEART:  Regular rate, rhythm (S1, S2).  Soft grade 1-2/6 systolic ejection  murmur at the base.  ABDOMEN:  Soft, nontender.  EXTREMITIES:  Intact distal pulses.  There is a small incision site on the  medial aspect of the right knee which has had some continued oozing.  The  area is not swollen or red or suggestive of infection.  NEURO:  No focal deficit.   IMPRESSION:  1. Severe ischemic cardiomyopathy/multivessel and left main disease.      a.     Ejection fraction 30-40% by catheterization.      b.     Status post non-ST elevation myocardial infarction.      c.     Three vessel coronary artery bypass graft:  Left internal       mammary artery-left anterior descending;  saphenous vein graft-CFX;       saphenous vein graft-posterior descending branch August, 2007.  2. Critical left carotid stenosis.  a.     Status post concomitant left carotid endarterectomy.  3. Postoperative anemia - resolved.  4. Postoperative atrial fibrillation.      a.     Discharge on amiodarone, since discontinued.  5. Hyperlipidemia.  6. Remote tobacco.  7. Hypertension - stable.   PLAN:  Patient continues to do well from a cardiovascular standpoint and  does not present with symptoms suggestive of recurrent paroxysmal atrial  fibrillation.  His postural dizziness, which has since resolved over these  past several days, is not related to a drop in blood pressure as noted by  today's orthostatic blood pressure readings.  Therefore, we will continue  with watchful waiting and not make any adjustments of his current medication  regimen.  We will otherwise plan on having him return for continued followup  with Dr. Andee Lineman in 6 months.     ______________________________  Rozell Searing, PA-C    ______________________________  Learta Codding, MD,FACC   GS/MedQ  DD: 05/31/2006  DT: 06/02/2006  Job #: [id number]

## 2010-12-22 NOTE — Consult Note (Signed)
NAMESTELLAN, [name]                ACCOUNT NO.:  [id number]   MEDICAL RECORD NO.:  [id number]           PATIENT TYPE:   LOCATION:                                 FACILITY:   PHYSICIAN:  [name], M.D. DATE OF BIRTH:  06/08/1929   DATE OF CONSULTATION:  06/26/2005  DATE OF DISCHARGE:                                   CONSULTATION   CHIEF COMPLAINT:  Hemoccult positive stool, history of adenomatous polyps.   REQUESTING PHYSICIAN:  [name] in [place], [place].   HISTORY OF PRESENT ILLNESS:  [name] is a 77 year old Caucasian gentleman  patient of Dr. Margo [name] who presents today for further evaluation of heme  positive stools given his history of adenomatous polyps.  He was last seen  by Dr. Jena [name] at time of colonoscopy on April 01, 2003.  He had a single  anal papilla and internal hemorrhoids.  He had a few scattered pan colonic  diverticula.  A 1 cm pedunculated polyp at the hepatic flexure was removed.  Pathology revealed villous adenoma.  Three year follow up colonoscopy was  recommended at that time.  He has been doing fairly well.  He saw bright red  blood on the toilet tissue one time recently, otherwise, stools are very  regular.  Denies any frequent rectal bleeding.  He has intermittent sharp  lower abdominal pains for the last few months, but a week ago he developed  more significant pain, especially at nighttime.  He noted abdominal  swelling, and he had two days of black stool.  This was also in the setting  of a single dose of Pepto-Bismol and codeine given for a cough.  His stool  color has now returned to normal.  His abdominal pain has lessened  tremendously.  His bowels move on a daily basis.  No nausea or vomiting,  heartburn, weight loss, dysphagia or odynophagia.  He denies any NSAID use.  He takes a daily 325 mg aspirin.  He used Advil a couple of months ago when  he was having some arthritic pain, but none recently.  Recently, he returned  six hemoccult cards which were positive.  On rectal examination October  2006, by Dr. Margo [name], stool was heme negative, and there were no rectal  lesions noted.   CURRENT MEDICATIONS:  1.  Aspirin 325 mg daily.  2.  Lisinopril/HCTZ 20/12.5 mg daily.  3.  Prevacid 30 mg daily.  4.  Zetia 10 mg daily.  5.  Metoprolol 100 mg, 1/4 b.i.d.   ALLERGIES:  No known drug allergies.   PAST MEDICAL HISTORY:  1.  Hypertension.  2.  Gastroesophageal reflux disease.  3.  Hyperlipidemia.  4.  History coronary artery disease status post MI in 1992 with arthrectomy      of the RCA.  5.  History of adenomatous polyps in the past, the most recent on April 01, 2003, as outlined above.  In 1995, he had a large adenomatous polyp      removed as well.  This polyp measured 3.5 cm  and was on a stalk in the      descending colon.  Also, had a 1 cm polyp on the stalk in the transverse      colon in 1995, as well.  6.  History of dipstick hematuria.  7.  History of melanoma in 1989 on his face with no recurrence.  8.  He had one testicle removed in 1990.  9.  Nasal polyp removed in 1993.  10. 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   FAMILY HISTORY:  Mother and father both died of heart disease.  Brother died  of skin cancer.  No family history of colorectal cancer.   SOCIAL HISTORY:  He is married.  Has four biological children, one  stepchild.  He was retired from [employer].  He quit smoking 15 years ago,  previously smoked three packs a day.  Quite alcohol consumption 15 years  ago, prior to that consumed only on weekends.   REVIEW OF SYSTEMS:  See HPI for GI and CONSTITUTIONAL.  CARDIOPULMONARY:  No  chest pain or shortness of breath.   PHYSICAL EXAMINATION:  VITAL SIGNS:  Weight 183, height 5 feet 9 inches,  temperature 98, blood pressure 124/74, pulse 76.  GENERAL APPEARANCE:  Pleasant, well-nourished, well-developed, elderly   Caucasian male in no acute distress.  SKIN:  Warm and dry, no jaundice.  HEENT:  Conjunctivae are pink.  Sclerae are nonicteric.  Oropharyngeal  mucosa moist and pink.  No lesion, erythema or exudate.  No lymphadenopathy,  thyromegaly.  CHEST:  Lungs are clear to auscultation.  CARDIAC:  Regular rate and rhythm.  Normal S1, S2.  No murmurs, rubs or  gallops.  ABDOMEN:  Positive bowel sounds.  Soft.  He appears to be full in the flank.  On palpation, his spleen is appreciated in the left upper quadrant.  Possible hepatomegaly as well, though, it is somewhat difficult on  examination.  Nontender.  No abdominal bruits or hernias.  EXTREMITIES:  No edema.   IMPRESSION:  1.  Hemoccult positive stool, one episode of toilet tissue, hematochezia and      recent melena.  He has a history of adenomatous polyps.  Last      colonoscopy over two years ago.  Definitely needs to have a colonoscopy      at this time given history of heme positive stools.  Recent episode of      melena may be due to Pepto-Bismol.  However, given his history of      chronic GERD over five years and no prior upper endoscopy, I recommend      EGD at this time as well.  I would like to rule out complications such      as Barrett's esophagus or peptic ulcer disease.  2.  On examination today, he appears to have splenomegaly and possibly      hepatomegaly.  No prior history of problems.  We will evaluate with      abdominal ultrasound.  He does have history of prior alcohol consumption      but does not appear to be significant amounts.  3.  Recent lower abdominal pain.  Unclear etiology at this time.  Will      proceed with current workup and go from there.   PLAN:  1.  Colonoscopy EGD in the near future.  2.  CBC and LFT's.  3.  Abdominal  ultrasound.  4.  Continue Prevacid 30 mg daily.      [name], P.AJonathon [name], M.D.  Electronically Signed   LL/MEDQ  D:  06/26/2005  T:  06/26/2005  Job:   [id number]   cc:   [name]  Fax: [phone]

## 2010-12-22 NOTE — Consult Note (Signed)
NAMEVIDAL, LAMPKINS NO.:  [id number]   MEDICAL RECORD NO.:  [id number]          PATIENT TYPE:  INP   LOCATION:  2012                         FACILITY:  [facility]   PHYSICIAN:  Kerin Perna, M.D.  DATE OF BIRTH:  Aug 09, 1928   DATE OF CONSULTATION:  04/03/2006  DATE OF DISCHARGE:                                   CONSULTATION   PRIMARY CARE PHYSICIAN:  Dr. Margo Common in [place], [place].   PRIMARY CARDIOLOGIST:  Learta Codding, MD,FACC, [facility]  Card[facility].   REASON FOR CONSULTATION:  Left main stenosis with three vessel coronary  artery disease and class III unstable angina.   CHIEF COMPLAINT:  Shortness of breath and chest pain.   HISTORY OF PRESENT ILLNESS:  I was asked to evaluate this 77 year old white  male ex-smoker for potential surgical coronary revascularization for  recently diagnosed significant left main stenosis and reduced LV function.  The patient has had exertional dyspnea and exertional chest pain relieved by  nitroglycerin the past few weeks.  He was admitted to [facility] on  March 26, 2006 with acute exacerbation of shortness of breath with symptoms  of URI including productive cough, sinus congestion and subjective fever. At  the time of his admission, his cardiac enzymes were checked and found to be  mildly elevated with a troponin of 0.06.  His EKG showed no ischemic  changes. A 2-D echo, however, showed significant reduction in LV function  with EF of 30% and 2+ mitral regurgitation.  His BNP was mildly elevated at  216.  He was treated for his URI with Levaquin, prednisone taper, Spiriva  and Mucinex.  His pulmonary symptoms have  resolved.  His CT scan and chest  x-ray showed evidence of COPD at the time of his admission.   The patient underwent diagnostic catheterization today which demonstrated 70  to 80% left main stenosis, 80% stenosis of the right coronary, and EF of 30  to 40% with LVEDP of 10 mmHg.  Because of his coronary anatomy, he was felt  to be a candidate for surgical revascularization.  He is currently stable  following cardiac catheterization in the hospital.   PAST MEDICAL HISTORY:  1. Left carotid stenosis by ultrasound August 2007, 80 to 99%.  2. Chronic obstructive pulmonary disease, quit smoking 15 years ago.  3. Status post myocardial infarction in 1992.  4. Hypertension.  5. Hyperlipidemia.  6. Gastroesophageal reflux disease.  7. 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  8. NO KNOWN DRUG ALLERGIES.   HOME MEDICINES:  1. Levaquin.  2. He is finishing prednisone taper.  3. Hyzaar 50/12.5 mg daily.  4. Spiriva 18 mcg one puff daily.  5. Nitroglycerin p.r.n.  6. Zetia 10 mg daily.  7. Aspirin 325 mg daily.   SOCIAL HISTORY:  The patient is married, is retired from working in the  [employer].  He has adult children.  He has stopped smoking and drinking  alcohol 15 years ago.  FAMILY HISTORY:  Positive for coronary artery disease and myocardial  infarction.   REVIEW OF SYSTEMS:  CONSTITUTIONAL review is negative for weight loss or  night sweats.  He had a low-grade fever at the time of his admission for his  URI earlier this month.  HEENT reveals positive symptoms of sinus  congestion, negative for difficulty swallowing or active dental complaints.  THORACIC review is negative for chest trauma or history of pulmonary nodule  on chest x-ray.  His productive cough has resolved.  He denied hemoptysis.  CARDIAC review is positive for severe coronary disease with reduced LV  function.  GI review is positive for GERD and chronic AVM and diverticular  disease and colonic polyps.  UROLOGIC review is negative for prostate  cancer,  negative for hematuria.  ENDOCRINE review is negative for diabetes  or thyroid disease.  VASCULAR review is positive for his left carotid  stenosis  of greater than 80%, however, there is no history of TIA.  NEUROLOGIC review is negative for stroke or seizure.   PHYSICAL EXAMINATION:  VITAL SIGNS:  The patient is 5 feet 9 inches and  weighs 175 pounds. Blood pressure 110/70, pulse 60 and regular, respirations  18.  GENERAL APPEARANCE:  Very pleasant alert white gentleman in no distress  following cardiac catheterization.  HEENT:  Normocephalic.  Dentition good.  NECK:  Without JVD or mass.  There is a soft left carotid bruit.  LYMPHATICS:  No palpable supraclavicular or axillary adenopathy.  THORAX:  Without deformity.  Breath sounds are clear without wheeze but  somewhat distant.  CARDIAC:  Regular rhythm without S3 gallop or murmur.  ABDOMEN:  Soft without pulsatile mass or focal tenderness.  EXTREMITIES:  No clubbing, cyanosis or edema.  Peripheral pulses are 2+  bilaterally in the radial and femoral areas, not palpable in the pedal  regions.  He has no evidence of venous insufficiency of the lower  extremities.  SKIN:  Without rash or lesions.  NEUROLOGIC:  Alert and oriented without focal motor or motor deficit.   LABORATORY DATA:  I reviewed the coronary arteriograms and agree with Dr.  Earmon [place] interpretation of a significant left main stenosis with three  vessel disease and significant reduction of left ventricular function with  ejection fraction of 30%.  Chest x-ray is pending.   IMPRESSION AND PLAN:  The patient would  benefit from coronary  revascularization with possible combined left carotid endarterectomy.  The  patient will be tentatively scheduled for surgery within the next 24-48  hours and will undergo assessment of his carotid stenosis with duplex  ultrasound and a vascular surgery consultation preoperatively.  I discussed  the procedure of coronary artery bypass graft with the patient and his  family and they understand the benefits, the alternatives and the risks and agree to proceed.  Thank you for the  consultation.     Kerin Perna, M.D.  Electronically Signed    PV/MEDQ  D:  04/03/2006  T:  04/03/2006  Job:  [id number]

## 2010-12-22 NOTE — Op Note (Signed)
NAME:  [name], [name]                          ACCOUNT NO.:  [id number]   MEDICAL RECORD NO.:  [id number]                   PATIENT TYPE:  AMB   LOCATION:  DAY                                  FACILITY:  [facility]   PHYSICIAN:  R. Roetta Sessions, M.D.              DATE OF BIRTH:  06/08/1929   DATE OF PROCEDURE:  04/01/2003  DATE OF DISCHARGE:                                 OPERATIVE REPORT   PROCEDURE:  Colonoscopy with snare polypectomy.   INDICATIONS FOR PROCEDURE:  The patient is a 77 year old gentleman with a  prior history of colonic adenomatous polyps removed at a colonoscopy in the  past.  Last surveillance exam was 03/10/2003.  He was found to have a  hyperplastic polyp which was removed at that time.  He has done well,  without any bowel symptoms in the interim.  He comes now for surveillance.  This approach has been discussed with the patient at length.  The potential  risks, benefits, and alternatives have been reviewed and questions answered.  He is agreeable.  Please see the documentation in the medical record.   PROCEDURE:  O2 saturation, blood pressure, pulses, and respirations were  monitored throughout the entire procedure.  Conscious sedation was with  Versed 3 mg IV, Demerol 50 mg IV in divided doses.  The instrument used was  the Olympus video chip adult colonoscope.   FINDINGS:  Digital rectal examination revealed no abnormalities.   ENDOSCOPIC FINDINGS:  The prep was good.   Rectum:  Examination of the rectal mucosa including retroflex view of the  anal verge revealed a single anal papilla and only some internal  hemorrhoids.   Colon:  The colonic mucosa was surveyed from the rectosigmoid junction  through the left, transverse, right colon to the area of the appendiceal  orifice, ileocecal valve, and cecum.  The patient was noted to have  scattered pancolonic diverticula and a 1-cm pedunculated polyp at the  hepatic flexure.  From the level of the cecum and  ileocecal valve, the scope  was slowly withdrawn.  All previously mentioned mucosal surfaces were again  seen, and no other abnormalities were observed.  The polyp at the hepatic  flexure was resected with snare cautery using the Irby unit.  It was  recovered through the scope.  The patient tolerated the procedure well and  was reactive in endoscopy.   IMPRESSION:  1. Single anal papilla and internal hemorrhoids.  Otherwise, normal rectum.  2. A few scattered pancolonic diverticula.  3. Pedunculated polyp at hepatic flexure removed with a snare.    RECOMMENDATIONS:  1. No aspirin or arthritis medications for the next 10 days.  2. Follow up on pathology.  3. Further recommendations to follow.  [name], M.D.    RMR/MEDQ  D:  04/01/2003  T:  04/01/2003  Job:  [id number]   cc:   Wyvonnia Lora  [address]  [place]  [place] [address]  Fax: [phone]

## 2010-12-22 NOTE — Op Note (Signed)
[name], [name]                ACCOUNT NO.:  [id number]   MEDICAL RECORD NO.:  [id number]          PATIENT TYPE:  AMB   LOCATION:  DAY                           FACILITY:  [facility]   PHYSICIAN:  R. Roetta Sessions, M.D. DATE OF BIRTH:  06/08/1929   DATE OF PROCEDURE:  07/04/2005  DATE OF DISCHARGE:                                 OPERATIVE REPORT   PROCEDURE:  Diagnostic esophagogastroduodenoscopy followed by colonoscopy  with lesion ablation.   INDICATIONS FOR PROCEDURE:  A 77 year old gentleman with intermittent paper  hematochezia and possible episode of melena versus Pepto-Bismol ingestion,  history of colonic adenomatous polyps, history of long-standing  gastroesophageal reflux disease on Prevacid 30 mg orally daily. EGD and  colonoscopy are now being done. This approach has been discussed with the  patient at length. Potential risks, benefits, and alternatives have been  reviewed and questions answered. He is agreeable. Please see documentation  in the medical record.   PROCEDURE NOTE:  O2 saturation, blood pressure, pulse, and respirations were  monitored throughout the entire procedure. Conscious sedation with Versed 3  mg IV and Demerol 75 mg IV in divided doses.   INSTRUMENT:  Olympus video chip system.   FINDINGS:  Examination of the tubular esophagus revealed distal esophageal  erosions with a single 0.5 cm ulcer at the EG junction. There is no  Barrett's esophagus. No evidence of neoplasm. EG junction easily traversed.   Stomach:  Gastric cavity was empty and insufflated well with air. Thorough  examination of gastric mucosa including retroflexed view of the proximal  stomach and esophagogastric junction demonstrated only a hiatal hernia.  Pylorus patent and easily traversed. Examination of bulb and second portion  revealed no abnormalities.   THERAPEUTIC/DIAGNOSTIC MANEUVERS:  None.   The patient tolerated the procedure well and was prepared for colonoscopy.  Digital rectal revealed no abnormalities.   ENDOSCOPIC FINDINGS:  Prep was adequate.   Rectum:  Examination of the rectal mucosa including retroflexed view of the  anal verge revealed only internal hemorrhoids.   Colon:  Colonic mucosa was surveyed from the rectosigmoid junction through  the left, transverse, and right colon to the area of the appendiceal  orifice, ileocecal valve, and cecum. These structures were well seen and  photographed for the record. From this level, the scope was slowly  withdrawn, and all previously mentioned mucosal surfaces were again seen.  The patient had:  1.  Pan colonic diverticula.  2.  An AVM at the base of the cecum which was friable. Please see photos. It      was ablated with several applications of gold probe at 20 joules each.      There were ectatic blood vessels in the area of the cecum as well, but      there was a focal area of arteriovenous malformation which was oozy.   The remainder of the colonic mucosa appeared normal. No evidence of polyp or  neoplasm. The patient tolerated both procedures well and was reactive to  endoscopy.   IMPRESSION:  Esophagogastroduodenoscopy:  Distal esophageal erosions, single  small ulcer  at EG junction consistent with erosive/ulcerative reflux  esophagitis. Otherwise normal esophagus. Hiatal hernia. Otherwise normal  stomach, D1 and D2.   Colonoscopy findings:  Internal hemorrhoids. Otherwise normal rectum. Pan  colonic diverticula. Friable arteriovenous malformation at the cecum ablated  as described above.   RECOMMENDATIONS:  1.  Daily Metamucil or Citrucel fiber supplement.  2.  Diverticulosis literature provided to [name].  3.  Stop Prevacid. Begin Zegerid 40 mg orally each day before breakfast.  4.  Will plan to see this nice gentleman back in six weeks and see how he is      doing.      [name], M.D.  Electronically Signed     RMR/MEDQ  D:  07/04/2005  T:  07/05/2005  Job:   [id number]   cc:   Wyvonnia Lora  Fax: [phone]

## 2010-12-22 NOTE — Op Note (Signed)
NAMEISOM, KOCHAN                ACCOUNT NO.:  [id number]   MEDICAL RECORD NO.:  [id number]          PATIENT TYPE:  INP   LOCATION:  2316                         FACILITY:  [facility]   PHYSICIAN:  Burna Forts, M.D.DATE OF BIRTH:  12-01-1928   DATE OF PROCEDURE:  04/04/2006  DATE OF DISCHARGE:                                 OPERATIVE REPORT   ANESTHESIOLOGIST:  Burna Forts, M.D.   PROCEDURE:  Intraoperative transesophageal echocardiography.   INDICATIONS FOR PROCEDURE:  Mr. Helderman is a 77 year old gentleman, who  presents today for coronary artery bypass grafting, as well as carotid  endarterectomy, to be performed by Dr. Donata Clay and Dr. Edilia Bo  respectively.  He is known to have a previous inferior MI with some low  cardiac output issues.  Therefore, we plan to place a TEE probe for  evaluation of the cardiac structures and function.   PRE-CARDIOPULMONARY EXAMINATION VIA TRANSESOPHAGEAL ECHOCARDIOGRAPHY:  Left  ventricle:  Short axis view of the left ventricular chamber reveals a  moderately thickened left ventricular chamber seen in the short axis view,  with somewhat thinning in the inferior and the lateral walls.  The inferior  wall is essentially akinetic.  The posterior wall is essentially severely  hypokinetic down to the lateral aspect of the wall.  Overall, there is mild  decreased cardiac function with an estimated EF around 30 to 35%.  The long  axis view, and the short axis view again reveal the inferior-wall thinning  and akinesis noted in the inferior wall.   Papillary muscles are well outlined.  The anterior and septal wall areas  appear contractile and thickening during systolic contraction.   Mitral valve:  This is a thickened mitral valve apparatus, seen in the four-  chamber view.  The anterior leaflets particularly are thickened.  They do  appear to coapt just below the level of the annular dimensions.  On Doppler  examination, there are several  small jets seen along the commissure areas in  this four-chamber view.  The overall estimation of regurgitant flow would be  about 1+.  There is no prolapse.  There are no false segments noted.  Multiple views are taken in both mitral commisural views, long axis view, as  well as short axis view, again, suggesting no significant mitral prolapse or  flail, and associated with only about 1+ mitral regurgitant flow.   Left atrium:  Essentially normal, and the interatrial septum is interrogated  and is intact.   Aortic valve:  Aortic sclerosis is noted with heavy calcium in one edge of  the noncoronary cusp.  This cusp is also essentially fairly rigid and  minimally mobile in its opening.  However, the other cusps, coronary cusps,  left and right, appear to open satisfactorily, and essentially there is no  obstruction to flow and minimal if any aortic stenosis is noted.   Right ventricle, tricuspid valve and right atrium are essentially within  normal limits.   The patient was placed on cardiopulmonary bypass.  Hypothermia was  instituted.  Coronary artery bypass grafting was carried out.  The patient  was then rewarmed and separated from cardiopulmonary bypass with the initial  attempt.   POST-CARDIOPULMONARY EXAMINATION, LIMITED EXAM:  Left ventricle:  The left  ventricular ejection chamber is then interrogated in long and short axis  views, and again reveals the akinetic area in the inferior wall, the  hypokinetic area in the posterolateral wall.  The rest of the cardiac  examination shows satisfactory contractility, though overall, there is  slightly diminished function, as noted in the prebypass.  Overall, there are  essentially no essential changes in the left ventricular chamber function  post bypass.   The mitral valve is then interrogated in multiple views.  Again, it shows  thickened leaflets.  Coaptation appears satisfactory.  There is still only  about 1+ regurgitant flow  noted in this post-bypass period.   The rest of the cardiac examination was as previously described without  essential changes.  The patient was returned to the cardiac intensive care  unit in stable condition.           ______________________________  Burna Forts, M.D.     JTM/MEDQ  D:  04/04/2006  T:  04/04/2006  Job:  [id number]   cc:   Di Kindle. Edilia Bo, M.D.  Kerin Perna, M.D.

## 2010-12-22 NOTE — Op Note (Signed)
NAMETAUREAN, JU                ACCOUNT NO.:  [id number]   MEDICAL RECORD NO.:  [id number]          PATIENT TYPE:  INP   LOCATION:  2316                         FACILITY:  [facility]   PHYSICIAN:  Kerin Perna, M.D.  DATE OF BIRTH:  July 28, 1929   DATE OF PROCEDURE:  04/04/2006  DATE OF DISCHARGE:                                 OPERATIVE REPORT   OPERATION:  Coronary artery bypass grafting x3 (left internal mammary artery  to left anterior descending, saphenous vein graft to circumflex marginal,  saphenous vein graft to posterior descending).   POSTOPERATIVE DIAGNOSIS:  Class IV unstable angina with severe three-vessel  coronary disease and reduced left ventricular function.   SURGEON:  Kerin Perna, M.D.   ASSISTANT:  Evelene Croon, MD and Danelle Earthly, PA-C   ANESTHESIA:  General by Dr. Sharee Holster.   INDICATIONS:  The patient is a 77 year old white male ex-smoker with history  of prior MI 15 years ago who presented to [facility] with unstable  angina.  A 2-D echo demonstrated reduced LV function with mild mitral  regurgitation.  Cardiac enzymes were only slightly elevated with a troponin  of 0.06.  He was transferred to [facility] where he underwent cardiac  catheterization by Dr. Excell Seltzer.  This demonstrated severe three-vessel  coronary disease with a high-grade 90% stenosis of the right coronary, 80%  stenosis of the left main, and the ejection fraction of 30% with inferior  wall hypokinesia.  LVEDP was normal.  He is felt to be candidate for  surgical revascularization.  As part of his preoperative studies a carotid  Doppler demonstrated a approximately 90% stenosis of the left internal  carotid.  This was confirmed with an MRA study and carotid endarterectomy  was recommended.  This was performed by Dr. Waverly Ferrari who will  dictate that procedure in a separate operative note.   Prior to surgery I reviewed the patient's cardiac cath with the  family and  the patient and discussed indications and expected benefits of coronary  bypass surgery for treatment of his coronary disease.  I reviewed the  alternatives to surgical therapy as well.  I discussed the patient and  family the major aspects of the planned procedure including the choice of  conduit to include left internal mammary artery and endoscopically harvested  saphenous vein, location of the surgical incisions, use of general  anesthesia and cardiopulmonary bypass, and the expected postoperative  hospital recovery.  I discussed with the patient risks to him of coronary  bypass surgery including risks of MI, CVA, bleeding, blood transfusion  requirement, infection, and death.  He understood these implications for the  surgery and agreed to proceed with operation as planned under what I felt  was an informed consent.   OPERATIVE FINDINGS:  The patient's inferior wall had evidence of an old  scar.  The vein was harvested endoscopically from right thigh and had some  sclerosis but was of satisfactory quality.  The mammary artery had good  flow.  The patient had a coagulation defect after reversal of heparin with  protamine  was given one transfusion of platelets.  This improved his  coagulation function.  The distal right coronary vessels were heavily  calcified and poor targets for grafting.   PROCEDURE:  The patient was brought to operating room, placed supine on  operating table, general anesthesia was induced under invasive hemodynamic  monitoring.  The left neck, chest, abdomen and legs were prepped with  Betadine and draped as a sterile field.  A left carotid endarterectomy was  performed by Dr. Edilia Bo.  After he was completed, the left neck incision  was packed open.  A sternal incision was made as the saphenous vein was  being harvested endoscopically from the right leg.  The left internal  mammary artery was harvested as a pedicle graft from its origin at the   subclavian vessels.  Heparin was administered and ACT was documented as  being therapeutic.  Pursestrings were placed in the ascending aorta and  right atrium and the patient was cannulated placed on bypass.  The  coronaries were identified for grafting.  Cardioplegia catheters placed in  the ascending aorta.  The patient was cooled to 32 degrees and aortic  crossclamp was applied.  800 mL of cold blood cardioplegia was delivered to  the aortic root with good cardioplegic arrest and septal temperature  dropping less than 15 degrees.  Topical iced saline was used to augment  myocardial preservation.   The distal coronary anastomoses was then performed.  First distal  anastomosis was the posterior descending branch of the right coronary.  This  is a sclerotic diseased vessel but with a good 1.75 mm lumen and the probe  passed distally.  A reverse saphenous vein was sewn end-to-side with running  7-0 Prolene and there was good flow through graft.  The second distal  anastomosis was to circumflex marginal.  This a 1.7-mm vessel proximal 80%  left main.  A reverse saphenous vein sewn end-to-side with running 7-0  Prolene.  There was good flow through graft.  Cardioplegia was redosed.  The  third distal anastomosis was the distal third of the LAD where it became  epicardial in location.  More proximally it was deeply intramyocardial.  The  left IMA pedicle was brought through an opening created in the left lateral  pericardium was brought down onto the LAD and sewn end-to-side with running  8-0 Prolene.  There is excellent flow through the anastomosis with immediate  rise septal temperature after release of the pedicle clamp on the mammary  artery.  The mammary pedicle was secured to epicardium and the pedicle  bulldog was replaced.  Cardioplegia was redosed.   While the crossclamp was still in place two proximal vein anastomoses were  performed on the ascending aorta using a 4.0 mm punch with  running 6-0 Prolene.  Prior to tying down the final proximal anastomosis, air was vented  from the coronaries and the left side of the heart using the usual de-airing  maneuvers on bypass.  The crossclamp was then removed.   The heart was cardioverted back to a regular rhythm.  Air was aspirated from  the vein grafts with a 27 gauge needle.  The vein grafts were opened and  perfused.  Each had excellent flow.  Hemostasis was documented with proximal  and distal anastomoses.  The patient was rewarmed and reperfused.  Temporary  pacing wires were applied.  When the patient was rewarmed to 37 degrees.  The lungs re-expanded and the ventilator was resumed.  The patient  was then  weaned from bypass on low-dose dopamine with stable blood pressure and  cardiac output.  Protamine was administered without adverse reaction.  The  cannulas removed.  The mediastinum was irrigated warm antibiotic irrigation.  Leg incision was irrigated and closed in a standard fashion.  The patient  had persistent coagulopathy received platelets which improved coagulation  function.  The superior pericardial fat was closed over the aorta and vein  grafts.  Two mediastinal and left pleural chest tube were placed and brought  out through separate incisions.  The sternum was closed with interrupted  steel wire.  The pectoralis fascia was closed with a running #1 Vicryl.  The  subcutaneous and skin layers were closed in running Vicryl and sterile  dressings were applied.  The left neck incision had been drained with a 15-  Jamaica Blake drain and closed in layers using Vicryl.  Total cardiopulmonary  bypass time was 108 minutes, crossclamp time of 58 minutes.      Kerin Perna, M.D.  Electronically Signed    PV/MEDQ  D:  04/04/2006  T:  04/05/2006  Job:  [id number]   cc:   Veverly Fells. Excell Seltzer, MD

## 2011-01-18 ENCOUNTER — Encounter: Payer: Self-pay | Admitting: Cardiology

## 2011-02-19 ENCOUNTER — Other Ambulatory Visit: Payer: Self-pay

## 2011-02-21 ENCOUNTER — Other Ambulatory Visit (INDEPENDENT_AMBULATORY_CARE_PROVIDER_SITE_OTHER): Payer: Medicare Other

## 2011-02-21 DIAGNOSIS — I6529 Occlusion and stenosis of unspecified carotid artery: Secondary | ICD-10-CM

## 2011-02-27 NOTE — Procedures (Unsigned)
CAROTID DUPLEX EXAM  INDICATION:  Follow up carotid stenosis.  HISTORY: Diabetes:  Yes. Cardiac:  CABG x3. Hypertension:  Yes. Smoking:  Previous. Previous Surgery:  No previous carotid interventions. CV History:  Lightheadedness. Amaurosis Fugax No, Paresthesias No, Hemiparesis No.                                      RIGHT             LEFT Brachial systolic pressure:         128               126 Brachial Doppler waveforms:         WNL               WNL Vertebral direction of flow:        Antegrade         Antegrade DUPLEX VELOCITIES (cm/sec) CCA peak systolic                   52                106 ECA peak systolic                   587               112 ICA peak systolic                   71                214 ICA end diastolic                   23                57 PLAQUE MORPHOLOGY:                  Calcified         Heterogenous PLAQUE AMOUNT:                      Mild              Moderate PLAQUE LOCATION:                    CCA, ICA, ECA     CCA, ICA  IMPRESSION: 1. Right internal carotid artery stenosis in the 1% to 39% range. 2. Right external carotid artery stenosis present with a peak systolic     velocity of 587 cm/s. 3. Left internal carotid artery stenosis in the 40% to 59% range. 4. Essentially unchanged since previous study on 08/14/2010.  ___________________________________________ V. Charlena Cross, MD  SH/MEDQ  D:  02/21/2011  T:  02/22/2011  Job:  [id number]

## 2011-03-02 ENCOUNTER — Ambulatory Visit (INDEPENDENT_AMBULATORY_CARE_PROVIDER_SITE_OTHER): Payer: Medicare Other | Admitting: Cardiology

## 2011-03-02 ENCOUNTER — Encounter: Payer: Self-pay | Admitting: Cardiology

## 2011-03-02 VITALS — BP 123/77 | HR 71 | Resp 18 | Ht 69.0 in | Wt 169.8 lb

## 2011-03-02 DIAGNOSIS — I38 Endocarditis, valve unspecified: Secondary | ICD-10-CM

## 2011-03-02 DIAGNOSIS — I2589 Other forms of chronic ischemic heart disease: Secondary | ICD-10-CM

## 2011-03-02 DIAGNOSIS — Z95810 Presence of automatic (implantable) cardiac defibrillator: Secondary | ICD-10-CM

## 2011-03-02 DIAGNOSIS — I359 Nonrheumatic aortic valve disorder, unspecified: Secondary | ICD-10-CM

## 2011-03-02 DIAGNOSIS — I059 Rheumatic mitral valve disease, unspecified: Secondary | ICD-10-CM

## 2011-03-02 DIAGNOSIS — I2510 Atherosclerotic heart disease of native coronary artery without angina pectoris: Secondary | ICD-10-CM

## 2011-03-02 DIAGNOSIS — I509 Heart failure, unspecified: Secondary | ICD-10-CM

## 2011-03-02 DIAGNOSIS — I4891 Unspecified atrial fibrillation: Secondary | ICD-10-CM | POA: Insufficient documentation

## 2011-03-02 DIAGNOSIS — I779 Disorder of arteries and arterioles, unspecified: Secondary | ICD-10-CM | POA: Insufficient documentation

## 2011-03-02 NOTE — Assessment & Plan Note (Signed)
No recurrent chest pain. Coronary anatomy evaluated year ago. No indication for ischemia testing. Continue medical therapy.

## 2011-03-02 NOTE — Patient Instructions (Signed)
Your physician has requested that you have an echocardiogram. Echocardiography is a painless test that uses sound waves to create images of your heart. It provides your doctor with information about the size and shape of your heart and how well your heart's chambers and valves are working. This procedure takes approximately one hour. There are no restrictions for this procedure. If the results of your test are normal or stable, you will receive a letter.  If they are abnormal, the nurse will contact you by phone. Your physician wants you to follow up in: 6 months.  You will receive a reminder letter in the mail one-two months in advance.  If you don't receive a letter, please call our office to schedule the follow up appointment  

## 2011-03-02 NOTE — Assessment & Plan Note (Addendum)
We'll obtain a followup echocardiogram to further assess the severity of aortic insufficiency and mitral regurgitation particularly in the setting of shortness of breath. Ejection fraction 30-35%. I reviewed the patient's electrocardiogram. He does not have a typical of bundle branch block, SGOT atypical IVCD with a QRS duration of 120 ms. No clear indication for upgrade to CRT-D despite his symptoms of shortness of breath.

## 2011-03-02 NOTE — Assessment & Plan Note (Signed)
Reports no countershocks and as outlined above his QRS duration is 120 ms with intraventricular conduction delay but no typical of bundle branch block. No real clinical evidence that he would benefit from upgrade to CRT-D.

## 2011-03-02 NOTE — Progress Notes (Signed)
HPI  The patient is a 77 year old male with history of ICD implantation, prior atrial fibrillation on sotalol and intolerant of amiodarone therapy. He status post coronary bypass grafting with 3 out of 3 grafts patent in 2011. He has a history of COPD diabetes mellitus and hypertension. The patient was last seen in April of this year. He had some shortness of breath and he felt he had heart failure symptoms. His BNP level was elevated and I increased his Lasix from 40 mg a day to 60 mg a day. He also has multivalve or heart disease including mild to moderate aortic regurgitation and mild to moderate mitral regurgitation. The patient at that time also reported a history of blood in his stools hemoglobin was checked which was 12.0 level is 14.8 renal function is within normal limits. Guaiac stools however were negative for occult blood on these samples. The patient reports that he has chronic dyspnea. However this has not worsened from prior. His in NYHA class IIb. He is on good medical therapy for heart failure including a beta blocker, ACE inhibitor diuretic and spironolactone. He denies any chest pain orthopnea PND palpitations or syncope  No Known Allergies  Current Outpatient Prescriptions on File Prior to Visit  Medication Sig Dispense Refill  . albuterol (VENTOLIN HFA) 108 (90 BASE) MCG/ACT inhaler Inhale 2 puffs into the lungs every 6 (six) hours as needed.        [name] aspirin 325 MG tablet Take 325 mg by mouth daily.        . cetirizine (ZYRTEC) 10 MG tablet Take 10 mg by mouth 2 (two) times daily.        . famotidine (PEPCID) 20 MG tablet Take 40 mg by mouth at bedtime.        . Fluticasone-Salmeterol (ADVAIR DISKUS) 250-50 MCG/DOSE AEPB Inhale 1 puff into the lungs every 12 (twelve) hours.        . furosemide (LASIX) 40 MG tablet Take 1 1/2 tabs (60mg ) daily  45 tablet  6  . ipratropium-albuterol (DUONEB) 0.5-2.5 (3) MG/3ML SOLN Take 3 mLs by nebulization every 4 (four) hours as needed.          . nitroGLYCERIN (NITROSTAT) 0.4 MG SL tablet Place 0.4 mg under the tongue every 5 (five) minutes as needed. May repeat up to 3 times for chest pain. If pain continues, go to ED.       [name] omeprazole (PRILOSEC OTC) 20 MG tablet Take 20 mg by mouth daily.        . simvastatin (ZOCOR) 40 MG tablet Take 40 mg by mouth at bedtime.        . sotalol (BETAPACE) 160 MG tablet Take 0.5 tablets (80 mg total) by mouth 2 (two) times daily.  30 tablet  6  . valsartan (DIOVAN) 80 MG tablet Take 40 mg by mouth daily.        . ferrous sulfate 325 (65 FE) MG EC tablet Take 1 tablet (325 mg total) by mouth 2 (two) times daily.      . fexofenadine (ALLEGRA) 180 MG tablet Take 180 mg by mouth daily.        . fluticasone (FLONASE) 50 MCG/ACT nasal spray by Nasal route daily.        [name] glipiZIDE (GLUCOTROL) 2.5 MG 24 hr tablet Take 2.5 mg by mouth daily.        . potassium chloride (MICRO-K) 10 MEQ CR capsule Take 10 mEq by mouth daily.        [name]  predniSONE (DELTASONE) 5 MG tablet Take 5 mg by mouth as directed.        [name] spironolactone (ALDACTONE) 25 MG tablet Take 1 tablet (25 mg total) by mouth daily.  30 tablet  6    Past Medical History  Diagnosis Date  . Hypertension   . Coronary artery disease   . Diabetes mellitus   . Other specified forms of chronic ischemic heart disease   . Syncope and collapse   . Automatic implantable cardiac defibrillator in situ     QRS duration 120 ms no typical left bundle branch block.  . Atrial fibrillation     Taking sotalol, intolerant to amiodarone  . Valvular heart disease     Mild to moderate mitral valve insufficiency and mild aortic insufficiency  . Chronic airway obstruction, not elsewhere classified   . Old myocardial infarction   . Other and unspecified hyperlipidemia   . Congestive heart failure, unspecified   . Cerebrovascular disease, unspecified   . Other mechanical complication of other internal orthopedic device, implant, and graft   . CAD (coronary artery  disease)     status post left CEA    Past Surgical History  Procedure Date  . Repeat otif left knee with figure-of-eight tension band.   . Implantation of a dual-chamber implantable 06/18/2007  . Left carotid endarterectomy   . Coronary artery bypass graft 04/04/2006    No family history on file.  History   Social History  . Marital Status: Married    Spouse Name: N/A    Number of Children: N/A  . Years of Education: N/A   Occupational History  . RETIRED    Social History Main Topics  . Smoking status: Former Smoker    Quit date: 08/06/1990  . Smokeless tobacco: Not on file  . Alcohol Use: No  . Drug Use: No  . Sexually Active: Not on file   Other Topics Concern  . Not on file   Social History Narrative   Pt does not get regular exercise.   XBM:[id number] positives as outlined above. The remainder of the 18  point review of systems is negative  PHYSICAL EXAM BP 123/77  Pulse 71  Resp 18  Ht 5\' 9"  (1.753 m)  Wt 169 lb 12.8 oz (77.021 kg)  BMI 25.08 kg/m2  SpO2 95%  General: Well-developed, well-nourished in no distress Head: Normocephalic and atraumatic Eyes:PERRLA/EOMI intact, conjunctiva and lids normal Ears: No deformity or lesions Mouth:normal dentition, normal posterior pharynx Neck: Supple, no JVD.  No masses, thyromegaly or abnormal cervical nodes Lungs: Decreased breath sounds bilaterally without wheezing.  Normal percussion Cardiac: regular rate and rhythm with normal S1 and S2, no S3 or S4.  PMI is normal.  Soft systolic murmur at the apex Abdomen: Normal bowel sounds, abdomen is soft and nontender without masses, organomegaly or hernias noted.  No hepatosplenomegaly MSK: Back normal, normal gait muscle strength and tone normal Vascular: Pulse is normal in all 4 extremities Extremities: No peripheral pitting edema Neurologic: Alert and oriented x 3 Skin: Intact without lesions or rashes Lymphatics: No significant adenopathy Psychologic: Normal  affect   ECG: Normal sinus rhythm, intraventricular conduction delay. QRS duration 120 ms.  ASSESSMENT AND PLAN

## 2011-03-02 NOTE — Assessment & Plan Note (Signed)
Patient remains in normal sinus rhythm. 

## 2011-03-02 NOTE — Assessment & Plan Note (Signed)
Followed by vascular surgery. 

## 2011-03-05 ENCOUNTER — Other Ambulatory Visit: Payer: Self-pay | Admitting: Cardiology

## 2011-03-05 DIAGNOSIS — I359 Nonrheumatic aortic valve disorder, unspecified: Secondary | ICD-10-CM

## 2011-03-05 DIAGNOSIS — I059 Rheumatic mitral valve disease, unspecified: Secondary | ICD-10-CM

## 2011-03-07 ENCOUNTER — Other Ambulatory Visit: Payer: Medicare Other | Admitting: *Deleted

## 2011-03-07 ENCOUNTER — Other Ambulatory Visit: Payer: Self-pay | Admitting: Cardiology

## 2011-03-07 DIAGNOSIS — I059 Rheumatic mitral valve disease, unspecified: Secondary | ICD-10-CM

## 2011-03-08 ENCOUNTER — Other Ambulatory Visit (INDEPENDENT_AMBULATORY_CARE_PROVIDER_SITE_OTHER): Payer: Medicare Other | Admitting: *Deleted

## 2011-03-08 DIAGNOSIS — I059 Rheumatic mitral valve disease, unspecified: Secondary | ICD-10-CM

## 2011-03-08 DIAGNOSIS — I359 Nonrheumatic aortic valve disorder, unspecified: Secondary | ICD-10-CM

## 2011-04-05 ENCOUNTER — Encounter: Payer: Self-pay | Admitting: Internal Medicine

## 2011-04-05 ENCOUNTER — Ambulatory Visit (INDEPENDENT_AMBULATORY_CARE_PROVIDER_SITE_OTHER): Payer: Medicare Other | Admitting: *Deleted

## 2011-04-05 DIAGNOSIS — I472 Ventricular tachycardia: Secondary | ICD-10-CM

## 2011-04-05 DIAGNOSIS — Z95810 Presence of automatic (implantable) cardiac defibrillator: Secondary | ICD-10-CM

## 2011-04-05 DIAGNOSIS — I4891 Unspecified atrial fibrillation: Secondary | ICD-10-CM

## 2011-04-05 LAB — ICD DEVICE OBSERVATION
AL AMPLITUDE: 3.6 mv
AL IMPEDENCE ICD: 337.5 Ohm
ATRIAL PACING ICD: 94 pct
BAMS-0001: 150 {beats}/min
BAMS-0003: 70 {beats}/min
CHARGE TIME: 11.6 s
DEV-0020ICD: NEGATIVE
MODE SWITCH EPISODES: 3
RV LEAD IMPEDENCE ICD: 400 Ohm
RV LEAD THRESHOLD: 1 V
TOT-0006: 20081112000000
TOT-0007: 1
TOT-0008: 0
TZAT-0004SLOWVT: 8
TZAT-0012SLOWVT: 200 ms
TZAT-0018SLOWVT: NEGATIVE
TZAT-0019SLOWVT: 7.5 V
TZON-0003SLOWVT: 340 ms
TZON-0004SLOWVT: 24
TZON-0010SLOWVT: 80 ms
TZST-0001SLOWVT: 3
VENTRICULAR PACING ICD: 3.5 pct

## 2011-04-05 NOTE — Progress Notes (Signed)
icd check in clinic  

## 2011-04-16 ENCOUNTER — Telehealth: Payer: Self-pay | Admitting: *Deleted

## 2011-04-16 NOTE — Telephone Encounter (Signed)
Patient notified and verbalized understanding. 

## 2011-04-16 NOTE — Telephone Encounter (Signed)
Message copied by Murriel Hopper on Mon Apr 16, 2011 11:24 AM ------      Message from: Lewayne Bunting E      Created: Wed Apr 11, 2011  2:54 PM       Stable. Followup echo in 6 months for aortic insufficiency      ----- Message -----         From: Hoover Brunette, LPN         Sent: 04/06/2011  10:48 AM           To: Peyton Bottoms, MD            Please review his Echo results from 8/2.  Does not look like it was ever sent to you for review.            Thanks.

## 2011-05-16 ENCOUNTER — Other Ambulatory Visit (HOSPITAL_COMMUNITY): Payer: Self-pay | Admitting: Dentistry

## 2011-05-16 DIAGNOSIS — C321 Malignant neoplasm of supraglottis: Secondary | ICD-10-CM

## 2011-05-16 DIAGNOSIS — Z0189 Encounter for other specified special examinations: Secondary | ICD-10-CM

## 2011-05-21 ENCOUNTER — Ambulatory Visit (HOSPITAL_COMMUNITY): Payer: Self-pay | Admitting: Dentistry

## 2011-05-21 DIAGNOSIS — Z0189 Encounter for other specified special examinations: Secondary | ICD-10-CM

## 2011-05-21 DIAGNOSIS — C321 Malignant neoplasm of supraglottis: Secondary | ICD-10-CM

## 2011-05-22 LAB — CBC
HCT: 37.8 — ABNORMAL LOW
Hemoglobin: 12.8 — ABNORMAL LOW
RBC: 4.47

## 2011-05-28 ENCOUNTER — Other Ambulatory Visit: Payer: Self-pay | Admitting: Cardiology

## 2011-05-31 ENCOUNTER — Ambulatory Visit (INDEPENDENT_AMBULATORY_CARE_PROVIDER_SITE_OTHER): Payer: Medicare Other | Admitting: *Deleted

## 2011-05-31 ENCOUNTER — Encounter: Payer: Self-pay | Admitting: Internal Medicine

## 2011-05-31 DIAGNOSIS — I428 Other cardiomyopathies: Secondary | ICD-10-CM

## 2011-05-31 LAB — ICD DEVICE OBSERVATION
AL IMPEDENCE ICD: 350 Ohm
AL THRESHOLD: 0.75 V
ATRIAL PACING ICD: 94 pct
BAMS-0001: 150 {beats}/min
BAMS-0003: 70 {beats}/min
DEVICE MODEL ICD: 454179
MODE SWITCH EPISODES: 0
PACEART VT: 0
RV LEAD THRESHOLD: 1 V
TOT-0006: 20081112000000
TZAT-0004SLOWVT: 8
TZAT-0012SLOWVT: 200 ms
TZAT-0013SLOWVT: 3
TZST-0001SLOWVT: 2
TZST-0003SLOWVT: 36 J
VENTRICULAR PACING ICD: 7.8 pct

## 2011-05-31 NOTE — Progress Notes (Signed)
ICD check 

## 2011-06-06 ENCOUNTER — Other Ambulatory Visit: Payer: Self-pay | Admitting: Cardiology

## 2011-06-22 ENCOUNTER — Other Ambulatory Visit: Payer: Self-pay | Admitting: Cardiology

## 2011-07-05 ENCOUNTER — Ambulatory Visit (INDEPENDENT_AMBULATORY_CARE_PROVIDER_SITE_OTHER): Payer: Medicare Other | Admitting: Internal Medicine

## 2011-07-05 ENCOUNTER — Encounter: Payer: Self-pay | Admitting: Internal Medicine

## 2011-07-05 VITALS — BP 136/70 | HR 70 | Ht 69.0 in | Wt 167.0 lb

## 2011-07-05 DIAGNOSIS — I472 Ventricular tachycardia: Secondary | ICD-10-CM

## 2011-07-05 DIAGNOSIS — I2589 Other forms of chronic ischemic heart disease: Secondary | ICD-10-CM

## 2011-07-05 DIAGNOSIS — I4891 Unspecified atrial fibrillation: Secondary | ICD-10-CM

## 2011-07-05 LAB — ICD DEVICE OBSERVATION
AL AMPLITUDE: 2.7 mv
DEVICE MODEL ICD: 454179
FVT: 0
HV IMPEDENCE: 54 Ohm
RV LEAD AMPLITUDE: 11.7 mv
RV LEAD IMPEDENCE ICD: 462.5 Ohm
TOT-0008: 0
TOT-0009: 1
TOT-0010: 28
TZAT-0001SLOWVT: 1
TZAT-0019SLOWVT: 7.5 V
TZAT-0020SLOWVT: 1 ms
TZON-0004SLOWVT: 24
TZON-0005SLOWVT: 6
TZON-0010SLOWVT: 80 ms
TZST-0001SLOWVT: 4
TZST-0003SLOWVT: 25 J
VF: 0

## 2011-07-05 NOTE — Assessment & Plan Note (Signed)
Stable No changes 

## 2011-07-05 NOTE — Assessment & Plan Note (Signed)
Maintaining sinus with sotalol No changes

## 2011-07-05 NOTE — Assessment & Plan Note (Signed)
Normal ICD function See Arita Miss Art report  euvolemic today No changes

## 2011-07-05 NOTE — Progress Notes (Signed)
PCP:  TAPPER,DAVID B, MD  The patient presents today for routine electrophysiology followup.  Since last being seen in our clinic, the patient reports doing reasonably well.   He is receiving radiation and finds that he is frequently fatigued.  He continues to walk about a mile daily without difficulty. Today, he denies symptoms of palpitations, chest pain, shortness of breath (above baseline), orthopnea, PND, lower extremity edema, dizziness, presyncope, syncope, or neurologic sequela.  The patient feels that he is tolerating medications without difficulties and is otherwise without complaint today.   Past Medical History  Diagnosis Date  . Hypertension   . Coronary artery disease      status post coronary bypass grafting, status post catheterization  July 2011 with 3 out of 3 grafts patent., status post bypass in 2000.   . Diabetes mellitus   . Other specified forms of chronic ischemic heart disease   . Syncope and collapse   . Automatic implantable cardiac defibrillator in situ     QRS duration 120 ms no typical left bundle branch block.  . Atrial fibrillation     Taking sotalol, intolerant to amiodarone  . Valvular heart disease     Mild to moderate mitral valve insufficiency and mild aortic insufficiency  . Chronic airway obstruction, not elsewhere classified   . Old myocardial infarction   . Other and unspecified hyperlipidemia   . Congestive heart failure, unspecified   . Cerebrovascular disease, unspecified   . Other mechanical complication of other internal orthopedic device, implant, and graft   . Carotid artery disease     status post left CEA   Past Surgical History  Procedure Date  . Repeat otif left knee with figure-of-eight tension band.   . Implantation of a dual-chamber implantable 06/18/2007  . Left carotid endarterectomy   . Coronary artery bypass graft 04/04/2006    Current Outpatient Prescriptions  Medication Sig Dispense Refill  . albuterol (VENTOLIN HFA) 108  (90 BASE) MCG/ACT inhaler Inhale 2 puffs into the lungs every 6 (six) hours as needed.        [name] aspirin 325 MG tablet Take 325 mg by mouth daily.        . budesonide (PULMICORT) 0.5 MG/2ML nebulizer solution Take 0.5 mg by nebulization 2 (two) times daily.        . famotidine (PEPCID) 20 MG tablet Take 40 mg by mouth at bedtime.        . formoterol (PERFOROMIST) 20 MCG/2ML nebulizer solution Take 20 mcg by nebulization 2 (two) times daily.        [name] LASIX 40 MG tablet TAKE 1 & 1/2 TABLET DAILY.  45 each  6  . loratadine (CLARITIN) 10 MG tablet Take 10 mg by mouth daily.        . nitroGLYCERIN (NITROSTAT) 0.4 MG SL tablet Place 0.4 mg under the tongue every 5 (five) minutes as needed. May repeat up to 3 times for chest pain. If pain continues, go to ED.       [name] omeprazole (PRILOSEC OTC) 20 MG tablet Take 20 mg by mouth daily.        . predniSONE (DELTASONE) 5 MG tablet Take 5 mg by mouth every other day.       . simvastatin (ZOCOR) 40 MG tablet Take 40 mg by mouth at bedtime.        . sotalol (BETAPACE) 160 MG tablet TAKE (1/2) TABLET BY MOUTH TWICE DAILY.  30 tablet  6  . spironolactone (ALDACTONE)  25 MG tablet TAKE 1 TABLET ONCE DAILY.  30 tablet  6  . valsartan (DIOVAN) 80 MG tablet Take 40 mg by mouth daily.          No Known Allergies  History   Social History  . Marital Status: Married    Spouse Name: N/A    Number of Children: N/A  . Years of Education: N/A   Occupational History  . RETIRED    Social History Main Topics  . Smoking status: Former Smoker    Quit date: 08/06/1990  . Smokeless tobacco: Not on file  . Alcohol Use: No  . Drug Use: No  . Sexually Active: Not on file   Other Topics Concern  . Not on file   Social History Narrative   Pt does not get regular exercise.    Physical Exam: Filed Vitals:   07/05/11 1510  BP: 136/70  Pulse: 70  Height: 5\' 9"  (1.753 m)  Weight: 167 lb (75.751 kg)  SpO2: 95%    GEN- The patient is well appearing, alert and  oriented x 3 today.   Head- normocephalic, atraumatic Eyes-  Sclera clear, conjunctiva pink Ears- hearing intact Oropharynx- clear Neck- supple, no JVP Lymph- no cervical lymphadenopathy Lungs- prolonged expiratory phase, normal work of breathing Chest- ICD pocket is well healed Heart- Regular rate and rhythm, no murmurs, rubs or gallops, PMI not laterally displaced GI- soft, NT, ND, + BS Extremities- no clubbing, cyanosis, or edema ekg today reveals sinus rhythm 60 bpm, PR 208, QRS 100, Qtc 442, nonspecific ST/T changes  ICD interrogation- reviewed in detail today,  See PACEART report  Assessment and Plan:

## 2011-07-20 ENCOUNTER — Ambulatory Visit (INDEPENDENT_AMBULATORY_CARE_PROVIDER_SITE_OTHER): Payer: Medicare Other | Admitting: Cardiology

## 2011-07-20 VITALS — BP 102/68 | HR 74 | Ht 69.0 in | Wt 162.0 lb

## 2011-07-20 DIAGNOSIS — I2589 Other forms of chronic ischemic heart disease: Secondary | ICD-10-CM

## 2011-07-20 DIAGNOSIS — I679 Cerebrovascular disease, unspecified: Secondary | ICD-10-CM

## 2011-07-20 DIAGNOSIS — I951 Orthostatic hypotension: Secondary | ICD-10-CM

## 2011-07-20 DIAGNOSIS — R0602 Shortness of breath: Secondary | ICD-10-CM

## 2011-07-20 DIAGNOSIS — Z95810 Presence of automatic (implantable) cardiac defibrillator: Secondary | ICD-10-CM

## 2011-07-20 DIAGNOSIS — C760 Malignant neoplasm of head, face and neck: Secondary | ICD-10-CM

## 2011-07-20 DIAGNOSIS — I4891 Unspecified atrial fibrillation: Secondary | ICD-10-CM

## 2011-07-20 DIAGNOSIS — I5022 Chronic systolic (congestive) heart failure: Secondary | ICD-10-CM

## 2011-07-20 NOTE — Progress Notes (Signed)
IV NS over 1 hour per Dr. Earnestine Leys.  IV started to (R) AC with 22 ga angiocath. Infusion started. Infusing without difficulty. 800 mL/hour.

## 2011-07-20 NOTE — Patient Instructions (Signed)
Follow up as scheduled. Hold Lasix (furosemide) for now. Hold Diovan for 1 week. Your physician recommends that you go to the [facility] for lab work: BMET/TSH/BNP. DO TODAY.

## 2011-07-21 ENCOUNTER — Encounter: Payer: Self-pay | Admitting: Cardiology

## 2011-07-21 DIAGNOSIS — I951 Orthostatic hypotension: Secondary | ICD-10-CM | POA: Insufficient documentation

## 2011-07-21 DIAGNOSIS — C760 Malignant neoplasm of head, face and neck: Secondary | ICD-10-CM | POA: Insufficient documentation

## 2011-07-21 NOTE — Assessment & Plan Note (Signed)
Patient has marked orthostatic hypotension likely related to the inability to drink an adequate amount of fluid with recent diagnosis of head and neck cancer and radiation therapy. I gave the patient 1 L of intravenous saline. This was carefully done over an hour given the fact that he has LV dysfunction with an ejection fraction of 30-35%. He tolerated the infusion well and felt much better afterwards. I strongly recommended to the patient to drink at least a liter of fluid in the form of Gatorade. The patient will drink plain water which I told him will not replenish his intravascular volume adequately.

## 2011-07-21 NOTE — Assessment & Plan Note (Signed)
Stable and followed by vascular surgery. Patient at increased risk for carotid artery disease given radiation field. We will also check a TSH.

## 2011-07-21 NOTE — Assessment & Plan Note (Signed)
Patient is dehydrated. He made an appropriate decision not to take Lasix. However he was still taking spironolactone and potassium supplements. I told him that he is at risk for hyperkalemia when he does this. His been made aware of this and I asked him not to take both together in the absence of use of Lasix and particularly when he is dehydrated. We will check a BMET and a BNP level today.

## 2011-07-21 NOTE — Assessment & Plan Note (Signed)
No recurrent chest pain. Continue current medical therapy. Catheterization 2011 showed 3 out of 3 grafts patent.

## 2011-07-21 NOTE — Progress Notes (Signed)
[name] Bottoms, MD, [facility] ABIM Board Certified in Adult Cardiovascular Medicine,Internal Medicine and Critical Care Medicine    CC: Followup patient with coronary artery disease status post ICD and recently diagnosed with head and neck cancer. Current complaint dizziness and weakness upon standing  HPI:  The patient is a 77 year old male with a history of coronary artery disease, atrial fibrillation maintaining normal sinus rhythm on sotalol, status post ICD implantation and prior coronary bypass grafting. Bypass surgery was performed in 2000 The patient has recently been diagnosed with head and neck cancer and has been receiving radiation therapy. He has lost a lot of weight. The skin is markedly red in the area of the radiation field. He feels very dehydrated. His been unable to drink and eat as usual. He clearly has had decreased fluid intake. He has noticed dizziness or orthostatic symptoms and actually made a decision by himself to stop Lasix which he did 3 weeks ago. However the patient continued to take spironolactone as well as potassium supplements. Is very orthostatic in the clinic today and at the end of my interview I gave the patient a liter of intravenous normal saline. From a cardiac perspective he denies any chest pain or shortness of breath. He reports no ICD discharges. He has no orthopnea PND palpitations although he does report some presyncope.     PMH: reviewed and listed in Problem List in Electronic Records (and see below) Past Medical History  Diagnosis Date  . Hypertension   . Coronary artery disease      status post coronary bypass grafting, status post catheterization  July 2011 with 3 out of 3 grafts patent., status post bypass in 2000.  3 out of 3 grafts patent by catheterization in 2011  Ejection fraction 30-35%   . Diabetes mellitus   . Other specified forms of chronic ischemic heart disease   . Syncope and collapse   . Automatic implantable cardiac  defibrillator in situ     QRS duration 120 ms no typical left bundle branch block.  . Atrial fibrillation     Taking sotalol, intolerant to amiodarone  . Valvular heart disease     Mild to moderate mitral valve insufficiency and mild aortic insufficiency  . Chronic airway obstruction, not elsewhere classified   . Old myocardial infarction   . Other and unspecified hyperlipidemia   . Congestive heart failure, unspecified   . Cerebrovascular disease, unspecified   . Other mechanical complication of other internal orthopedic device, implant, and graft   . Carotid artery disease     status post left CEA  . Head and neck cancer     Receiving radiation therapy.  . Orthostatic hypotension    Past Surgical History  Procedure Date  . Repeat otif left knee with figure-of-eight tension band.   . Implantation of a dual-chamber implantable 06/18/2007  . Left carotid endarterectomy   . Coronary artery bypass graft 04/04/2006      Allergies/SH/FHX : available in Electronic Records for review and reviewed  Medications: Current Outpatient Prescriptions  Medication Sig Dispense Refill  . albuterol (VENTOLIN HFA) 108 (90 BASE) MCG/ACT inhaler Inhale 2 puffs into the lungs every 6 (six) hours as needed.        [name] aspirin 325 MG tablet Take 325 mg by mouth daily.        . budesonide (PULMICORT) 0.5 MG/2ML nebulizer solution Take 0.5 mg by nebulization 2 (two) times daily.        . famotidine (  PEPCID) 20 MG tablet Take 40 mg by mouth at bedtime.        . formoterol (PERFOROMIST) 20 MCG/2ML nebulizer solution Take 20 mcg by nebulization 2 (two) times daily.        [name] LASIX 40 MG tablet TAKE 1 & 1/2 TABLET DAILY.  45 each  6  . loratadine (CLARITIN) 10 MG tablet Take 10 mg by mouth daily.        . nitroGLYCERIN (NITROSTAT) 0.4 MG SL tablet Place 0.4 mg under the tongue every 5 (five) minutes as needed. May repeat up to 3 times for chest pain. If pain continues, go to ED.       [name] Omeprazole Magnesium  20.6 (20 BASE) MG CPDR Take 1 capsule by mouth daily.        . predniSONE (DELTASONE) 5 MG tablet Take 5 mg by mouth every other day.       . simvastatin (ZOCOR) 40 MG tablet Take 40 mg by mouth at bedtime.        . sotalol (BETAPACE) 160 MG tablet TAKE (1/2) TABLET BY MOUTH TWICE DAILY.  30 tablet  6  . spironolactone (ALDACTONE) 25 MG tablet TAKE 1 TABLET ONCE DAILY.  30 tablet  6  . valsartan (DIOVAN) 80 MG tablet Take 40 mg by mouth daily.          ROS: No nausea or vomiting. No fever or chills.No melena or hematochezia.No bleeding.No claudication. Dizziness upon standing  Physical Exam: BP 102/68  Pulse 74  Ht 5\' 9"  (1.753 m)  Wt 162 lb (73.483 kg)  BMI 23.92 kg/m2 patient is orthostatic blood pressures are recorded in the chart. General: Cachectic appearing white male, feeling dizzy and reporting decreased appetite Neck: Normal carotid upstroke, left carotid bruit status post well-healed left carotid endarterectomy scar. No thyromegaly and nonnodular thyroid. Chest: Well-healed sternotomy scar Lungs: Decreased breath sounds bilaterally but no wheezing Cardiac: Regular rate and rhythm with normal S1-S2 no definite pathological murmurs. Vascular: No edema, diminished pulses dorsalis pedis and posterior tibial Skin: Cool with decreased skin turgor  12lead ECG: Normal sinus rhythm. Intraventricular conduction delay inferior Q waves consistent with old inferior wall myocardial infarction Limited bedside ECHO:N/A   Assessment and Plan

## 2011-07-21 NOTE — Assessment & Plan Note (Signed)
Patient remains on sotalol. He is in normal sinus rhythm. He reports no ICD countershocks.

## 2011-07-26 ENCOUNTER — Other Ambulatory Visit: Payer: Self-pay | Admitting: Cardiology

## 2011-07-26 DIAGNOSIS — I359 Nonrheumatic aortic valve disorder, unspecified: Secondary | ICD-10-CM

## 2011-07-26 DIAGNOSIS — I509 Heart failure, unspecified: Secondary | ICD-10-CM

## 2011-08-23 ENCOUNTER — Other Ambulatory Visit (INDEPENDENT_AMBULATORY_CARE_PROVIDER_SITE_OTHER): Payer: Medicare Other | Admitting: *Deleted

## 2011-08-23 DIAGNOSIS — R0602 Shortness of breath: Secondary | ICD-10-CM

## 2011-08-23 DIAGNOSIS — I5022 Chronic systolic (congestive) heart failure: Secondary | ICD-10-CM

## 2011-08-23 DIAGNOSIS — I4891 Unspecified atrial fibrillation: Secondary | ICD-10-CM

## 2011-08-23 DIAGNOSIS — R0989 Other specified symptoms and signs involving the circulatory and respiratory systems: Secondary | ICD-10-CM

## 2011-08-23 DIAGNOSIS — I359 Nonrheumatic aortic valve disorder, unspecified: Secondary | ICD-10-CM

## 2011-08-23 DIAGNOSIS — I509 Heart failure, unspecified: Secondary | ICD-10-CM

## 2011-08-27 ENCOUNTER — Encounter: Payer: Self-pay | Admitting: *Deleted

## 2011-08-27 ENCOUNTER — Ambulatory Visit (HOSPITAL_COMMUNITY): Payer: Self-pay | Admitting: Dentistry

## 2011-08-27 VITALS — BP 112/69 | HR 74 | Temp 97.4°F

## 2011-08-27 DIAGNOSIS — Z8521 Personal history of malignant neoplasm of larynx: Secondary | ICD-10-CM

## 2011-08-27 DIAGNOSIS — K117 Disturbances of salivary secretion: Secondary | ICD-10-CM

## 2011-08-27 DIAGNOSIS — R432 Parageusia: Secondary | ICD-10-CM

## 2011-08-27 NOTE — Progress Notes (Signed)
Monday, August 27, 2011   BP: 112/69                   P:71              T: 94.7          Wgt: 157 lbs now. Was 170 lbs at start. 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  Patient underwent radiation therapy with Dr. Basilio Cairo until 07/20/11.  Patient now presents for a periodic oral examination after radiation therapy.   REVIEW OF CHIEF COMPLAINTS: DRY MOUTH: Yes HARD TO SWALLOW: No HURT TO SWALLOW: No TASTE CHANGES: Still has no taste. SORES IN MOUTH: No TRISMUS SYMPTOMS: Having no problems SYMPTOM RELIEF:  Using Biotene Rinses. HOME ORAL HYGIENE REGIMEN:  BRUSHING: 2 X a day FLOSSING: 1 X a day RINSING: see above FLUORIDE: Doing fluoride at night having no problems. TRISMUS EXERCISES: Maximum interincisal opening: 45 mm.   DENTAL EXAM: ORAL HYGIENE(PLAQUE):  Good oral hygiene. Minimal plaque noted. LOCATION OF MUCOSITIS:None noted. DESCRIPTION OF SALIVA: Decreased. Mild to moderate xerostomia. ANY EXPOSED BONE:None noted. OTHER WATCHED AREAS: Tooth #7.  DIAGNOSES: 1.  Xerostomia  2.  Dysgeusia 3.  Dental caries #7.   RECOMMENDATIONS: 1. Brush after meals and at bedtime. Use fluoride at bedtime. 2. Use trismus exercises as directed. 3. Use Biotene Rinse or salt water/baking soda rinses. 4. Multiple sips of water as needed. 5. Follow up with Dr. Everardo Beals in two months for exam and cleaning. Call if problems before then.  Dr. Cindra Eves

## 2011-08-28 ENCOUNTER — Ambulatory Visit (INDEPENDENT_AMBULATORY_CARE_PROVIDER_SITE_OTHER): Payer: Medicare Other | Admitting: Cardiology

## 2011-08-28 ENCOUNTER — Encounter: Payer: Self-pay | Admitting: Cardiology

## 2011-08-28 VITALS — BP 128/82 | HR 89 | Ht 69.0 in | Wt 160.0 lb

## 2011-08-28 DIAGNOSIS — R0602 Shortness of breath: Secondary | ICD-10-CM

## 2011-08-28 DIAGNOSIS — I5023 Acute on chronic systolic (congestive) heart failure: Secondary | ICD-10-CM

## 2011-08-28 DIAGNOSIS — R05 Cough: Secondary | ICD-10-CM

## 2011-08-28 DIAGNOSIS — I4891 Unspecified atrial fibrillation: Secondary | ICD-10-CM

## 2011-08-28 MED ORDER — OMEPRAZOLE 20 MG PO CPDR: 20.0000 mg | DELAYED_RELEASE_CAPSULE | Freq: Two times a day (BID) | ORAL | Status: DC

## 2011-08-28 MED ORDER — FLUTICASONE PROPIONATE 50 MCG/ACT NA SUSP: 2.0000 | Freq: Every day | NASAL | Status: DC

## 2011-08-28 MED ORDER — PREDNISONE 5 MG PO TABS: 5.0000 mg | ORAL_TABLET | Freq: Every day | ORAL | Status: DC

## 2011-08-28 MED ORDER — MEGESTROL ACETATE 400 MG/10ML PO SUSP: 400.0000 mg | Freq: Every day | ORAL | Status: DC

## 2011-08-28 MED ORDER — MEGESTROL ACETATE 40 MG PO TABS: 40.0000 mg | ORAL_TABLET | Freq: Every day | ORAL | Status: DC

## 2011-08-28 MED ORDER — FUROSEMIDE 40 MG PO TABS: 40.0000 mg | ORAL_TABLET | Freq: Every day | ORAL | Status: DC

## 2011-08-28 NOTE — Patient Instructions (Addendum)
   Fluticasone Nasal Spray - may use 1 puff up to 2 puffs each nostril twice a day   Resume Lasix 40mg  daily  Change Prednisone to 5mg  daily  Megace 400mg /68ml daily Your physician recommends that you go to the [facility] for lab work for [facility] & BMET in 7-10 days  If the results of your test are normal or stable, you will receive a letter.  If they are abnormal, the nurse will contact you by phone. Stop Pepcid Change to Prilosec 20mg  twice a day  Your physician wants you to follow up in:  3 months.  You will receive a reminder letter in the mail one-two months in advance.  If you don't receive a letter, please call our office to schedule the follow up appointment

## 2011-08-28 NOTE — Progress Notes (Signed)
[name] Bottoms, MD, [facility] ABIM Board Certified in Adult Cardiovascular Medicine,Internal Medicine and Critical Care Medicine    CC: Follow patient with a history of coronary artery disease and previous presentation with orthostatic hypotension  HPI:  The patient is a 78 year old male with history of ischemic cardiomyopathy ejection fraction 30-35% status post ICD and bypass surgery. Catheterization in 2011 demonstrated 3 out of 3 grafts patent. Last office visit the patient presented with both hypotension and orthostatic symptoms. He was given a liter of fluids in the office. He reported that he was undergoing radiation therapy for head and neck cancer and had difficulty swallowing liquids and food. He states that his orthostasis has much improved although he has still occasional lightheadedness when he gets up too quickly. He also reports a frequent dry hacking cough which has been going on for several months. His fluid intake has improved but still has difficulty eating, in large part due to poor appetite. Of note is also that the patient has a history of atrial fibrillation but remains in normal sinus rhythm on sotalol therapy. Of note is also that the patient currently is not on Coumadin therapy. He denies any chest pain orthopnea or PND. He does report frequent symptoms of reflux. The patient does report increased shortness of breath on exertion as well as audible wheezing   PMH: reviewed and listed in Problem List in Electronic Records (and see below) Past Medical History  Diagnosis Date  . Hypertension   . Coronary artery disease      status post coronary bypass grafting, status post catheterization  July 2011 with 3 out of 3 grafts patent., status post bypass in 2000.   . Diabetes mellitus   . Other specified forms of chronic ischemic heart disease   . Syncope and collapse   . Automatic implantable cardiac defibrillator in situ     QRS duration 120 ms no typical left bundle branch  block.  . Atrial fibrillation     Taking sotalol, intolerant to amiodarone  . Valvular heart disease     Mild to moderate mitral valve insufficiency and mild aortic insufficiency  . Chronic airway obstruction, not elsewhere classified   . Old myocardial infarction   . Other and unspecified hyperlipidemia   . Congestive heart failure, unspecified   . Cerebrovascular disease, unspecified   . Other mechanical complication of other internal orthopedic device, implant, and graft   . Carotid artery disease     status post left CEA  . Head and neck cancer     Receiving radiation therapy.  . Orthostatic hypotension    Past Surgical History  Procedure Date  . Repeat otif left knee with figure-of-eight tension band.   . Implantation of a dual-chamber implantable 06/18/2007  . Left carotid endarterectomy   . Coronary artery bypass graft 04/04/2006    Allergies/SH/FHX : available in Electronic Records for review  No Known Allergies History   Social History  . Marital Status: Married    Spouse Name: N/A    Number of Children: N/A  . Years of Education: N/A   Occupational History  . RETIRED    Social History Main Topics  . Smoking status: Former Smoker -- 2.0 packs/day for 60 years    Types: Cigarettes    Quit date: 08/06/1990  . Smokeless tobacco: Never Used  . Alcohol Use: No  . Drug Use: No  . Sexually Active: Not on file   Other Topics Concern  . Not on file  Social History Narrative   Pt does not get regular exercise.   No family history on file.  Medications: Current Outpatient Prescriptions  Medication Sig Dispense Refill  . albuterol (PROVENTIL) (2.5 MG/3ML) 0.083% nebulizer solution Take 2.5 mg by nebulization every 6 (six) hours as needed.      [name] albuterol (VENTOLIN HFA) 108 (90 BASE) MCG/ACT inhaler Inhale 2 puffs into the lungs every 6 (six) hours as needed.        [name] aspirin 325 MG tablet Take 325 mg by mouth daily.        . furosemide (LASIX) 40 MG tablet  Take 1 tablet (40 mg total) by mouth daily.      [name] loratadine (CLARITIN) 10 MG tablet Take 10 mg by mouth daily.        . nitroGLYCERIN (NITROSTAT) 0.4 MG SL tablet Place 0.4 mg under the tongue every 5 (five) minutes as needed. May repeat up to 3 times for chest pain. If pain continues, go to ED.       [name] Omeprazole Magnesium 20.6 (20 BASE) MG CPDR Take 1 capsule by mouth daily.        . predniSONE (DELTASONE) 5 MG tablet Take 1 tablet (5 mg total) by mouth daily.  30 tablet  1  . simvastatin (ZOCOR) 40 MG tablet Take 40 mg by mouth at bedtime.        . sotalol (BETAPACE) 160 MG tablet TAKE (1/2) TABLET BY MOUTH TWICE DAILY.  30 tablet  6  . spironolactone (ALDACTONE) 25 MG tablet TAKE 1 TABLET ONCE DAILY.  30 tablet  6  . valsartan (DIOVAN) 80 MG tablet Take 40 mg by mouth daily.        . fluticasone (FLONASE) 50 MCG/ACT nasal spray Place 2 sprays into the nose daily.  1 g  1  . megestrol (MEGACE) 400 MG/10ML suspension Take 10 mLs (400 mg total) by mouth daily.  240 mL  2  . omeprazole (PRILOSEC) 20 MG capsule Take 1 capsule (20 mg total) by mouth 2 (two) times daily.  60 capsule  6    ROS: No nausea or vomiting. No fever or chills.No melena or hematochezia.No bleeding.No claudication. Lightheadedness upon standing  Physical Exam: BP 128/82  Pulse 89  Ht 5\' 9"  (1.753 m)  Wt 160 lb (72.576 kg)  BMI 23.63 kg/m2  SpO2 97% General: Well-nourished white male pale appearing Neck: Well-healed left carotid endarterectomy scar. No thyromegaly nonnodular thyroid. JVP is 5 cm. Lungs: Clear breath sounds bilaterally with significant wheezing bilaterally Cardiac: Regular rate and rhythm with normal S1-S2 no murmurs or gallops Vascular: No edema. Palpable pulses distally bilaterally Skin: Cool and dry Physcologic: Normal affect  12lead ECG: Normal sinus rhythm Limited bedside ECHO:N/A   Patient Active Problem List  Diagnoses  . DM  . DYSLIPIDEMIA  . HYPERTENSION  . ISCHEMIC  CARDIOMYOPATHY-ejection fraction 30-35%   . VENTRICULAR TACHYCARDIA-status post ICD   . CHRONIC SYSTOLIC HEART FAILURE  . CEREBROVASCULAR DISEASE  . WEAKNESS  . CAROTID BRUIT-status post carotid endarterectomy   . CLOSED FRACTURE OF PATELLA  . OTH MECH COMPL OTH INT ORTHOPEDIC DEVC IMPL&GFT  . IMPLANTATION OF DEFIBRILLATOR, HX OF  . Anemia-rule out recurrent anemia   . Coronary artery disease-status post cardiac catheterization 2011 with 3 out of 3 patent grafts   . Carotid artery disease-status post left carotid endarterectomy   . Valvular heart disease-no significant murmurs on exam   . Atrial fibrillation-on sotalol maintaining normal sinus rhythm   .  Head and neck cancer-status post XRT   . Orthostatic hypotension-improved     PLAN   The patient still has a poor appetite. He has increased his fluid intake but now reports increased shortness of breath. We held his diuretic therapy during the last office visit because of his orthostatic symptoms.  I requested that the patient started back on Lasix 40 mg daily in conjunction with spironolactone 25 mg daily. We will followup with a BMET in 10 days.  The patient appears to be quite pale. He has a history of anemia and we will also order a CBC.  I will give the patient a prescription for Megace 400 mg by mouth daily hopefully to improve his appetite.  He still has some symptoms of lightheadedness and I gave her prescription for compression stockings as he will not be able to give him further fluids to do is poor LV function.  If the patient is not anemic we will discuss the use of Coumadin during his next clinic visit. The patient is at high risk for thromboembolic disease.  The patient has significant wheezing on exam and I increased his prednisone to 5 mg by mouth daily.

## 2011-08-31 ENCOUNTER — Encounter (HOSPITAL_COMMUNITY): Payer: Self-pay | Admitting: Dentistry

## 2011-09-12 ENCOUNTER — Encounter: Payer: Self-pay | Admitting: *Deleted

## 2011-09-14 ENCOUNTER — Encounter: Payer: Self-pay | Admitting: Internal Medicine

## 2011-09-14 ENCOUNTER — Ambulatory Visit (INDEPENDENT_AMBULATORY_CARE_PROVIDER_SITE_OTHER): Payer: Medicare Other | Admitting: *Deleted

## 2011-09-14 DIAGNOSIS — Z95810 Presence of automatic (implantable) cardiac defibrillator: Secondary | ICD-10-CM

## 2011-09-14 DIAGNOSIS — I5022 Chronic systolic (congestive) heart failure: Secondary | ICD-10-CM

## 2011-09-14 LAB — ICD DEVICE OBSERVATION
AL AMPLITUDE: 2.3 mv
AL THRESHOLD: 0.75 V
ATRIAL PACING ICD: 94 pct
BATTERY VOLTAGE: 2.62 V
DEV-0020ICD: NEGATIVE
DEVICE MODEL ICD: 454179
RV LEAD AMPLITUDE: 11.7 mv
TZAT-0001SLOWVT: 1
TZAT-0012SLOWVT: 200 ms
TZAT-0019SLOWVT: 7.5 V
TZAT-0020SLOWVT: 1 ms
TZON-0004SLOWVT: 24
TZON-0005SLOWVT: 6
TZON-0010SLOWVT: 80 ms
TZST-0001SLOWVT: 2
TZST-0001SLOWVT: 4
TZST-0003SLOWVT: 25 J
VENTRICULAR PACING ICD: 12 pct

## 2011-09-14 NOTE — Progress Notes (Signed)
Pt seen in clinic for follow up of ICD.  No complaints of chest pain, shortness of breath, dizziness, palpitations, or shocks.  Device functioning normally at this time.  1 episode of VT successfully treated with ATP.  For full details, see PaceArt report.  No programming changes made today.  Plan to follow up in 3 months with [facility] device clinic.  Gypsy Balsam, RN, BSN 09/14/2011 3:35 PM

## 2011-11-02 ENCOUNTER — Other Ambulatory Visit: Payer: Self-pay | Admitting: Cardiology

## 2011-11-26 ENCOUNTER — Other Ambulatory Visit: Payer: Self-pay | Admitting: Cardiology

## 2011-11-28 ENCOUNTER — Encounter: Payer: Self-pay | Admitting: Cardiology

## 2011-11-28 ENCOUNTER — Ambulatory Visit (INDEPENDENT_AMBULATORY_CARE_PROVIDER_SITE_OTHER): Payer: Medicare Other | Admitting: Cardiology

## 2011-11-28 VITALS — BP 127/74 | HR 69 | Ht 69.0 in | Wt 164.0 lb

## 2011-11-28 DIAGNOSIS — I472 Ventricular tachycardia: Secondary | ICD-10-CM

## 2011-11-28 DIAGNOSIS — I509 Heart failure, unspecified: Secondary | ICD-10-CM | POA: Insufficient documentation

## 2011-11-28 DIAGNOSIS — I10 Essential (primary) hypertension: Secondary | ICD-10-CM

## 2011-11-28 DIAGNOSIS — Z7901 Long term (current) use of anticoagulants: Secondary | ICD-10-CM | POA: Insufficient documentation

## 2011-11-28 DIAGNOSIS — I2510 Atherosclerotic heart disease of native coronary artery without angina pectoris: Secondary | ICD-10-CM

## 2011-11-28 DIAGNOSIS — I38 Endocarditis, valve unspecified: Secondary | ICD-10-CM

## 2011-11-28 DIAGNOSIS — I951 Orthostatic hypotension: Secondary | ICD-10-CM

## 2011-11-28 DIAGNOSIS — I4891 Unspecified atrial fibrillation: Secondary | ICD-10-CM

## 2011-11-28 DIAGNOSIS — I779 Disorder of arteries and arterioles, unspecified: Secondary | ICD-10-CM

## 2011-11-28 DIAGNOSIS — I2589 Other forms of chronic ischemic heart disease: Secondary | ICD-10-CM

## 2011-11-28 MED ORDER — WARFARIN SODIUM 5 MG PO TABS: 5.0000 mg | ORAL_TABLET | Freq: Every day | ORAL | Status: DC

## 2011-11-28 MED ORDER — ASPIRIN EC 81 MG PO TBEC: 81.0000 mg | DELAYED_RELEASE_TABLET | Freq: Every day | ORAL | Status: DC

## 2011-11-28 NOTE — Assessment & Plan Note (Signed)
No recurrent chest pain. Stable. Patient has not required any nitroglycerin.

## 2011-11-28 NOTE — Assessment & Plan Note (Signed)
Patient remains in normal sinus rhythm on sotalol which is tolerating well. We had a discussion about anticoagulation today. The patient is at high risk for stroke and is willing to proceed with Coumadin. Is concerned that some of the newer oral anticoagulant agents we'll put him in the doughnut hole. If not we can always switch later to Xarelto for example. I discussed risks and benefits of Coumadin anticoagulation the patient and I start at 5 mg a day today with followup in the Coumadin clinic this Friday.

## 2011-11-28 NOTE — Assessment & Plan Note (Signed)
No pathological murmurs on exam. We'll consider followup echo in one year.

## 2011-11-28 NOTE — Assessment & Plan Note (Signed)
Status post ICD with no clinical device discharges

## 2011-11-28 NOTE — Assessment & Plan Note (Signed)
Resolved patient is back on Lasix as well as spironolactone and is tolerating this well. Followup laboratory work showed no evidence of prerenal azotemia or hyperkalemia

## 2011-11-28 NOTE — Assessment & Plan Note (Signed)
Blood pressure well controlled will make no changes in his medical regimen

## 2011-11-28 NOTE — Assessment & Plan Note (Signed)
Followup carotid Dopplers needed in one year.

## 2011-11-28 NOTE — Assessment & Plan Note (Signed)
Ejection fraction 30-35% with no symptoms of heart failure. Catheterization 2011 3 out of 3 grafts patent.

## 2011-11-28 NOTE — Patient Instructions (Signed)
   Begin Coumadin 5mg  every evening   Enroll in Coumadin clinic - first visit on Friday  Decrease Aspirin to 81mg  daily Your physician wants you to follow up in: 6 months.  You will receive a reminder letter in the mail one-two months in advance.  If you don't receive a letter, please call our office to schedule the follow up appointment

## 2011-11-30 ENCOUNTER — Ambulatory Visit (INDEPENDENT_AMBULATORY_CARE_PROVIDER_SITE_OTHER): Payer: Medicare Other | Admitting: *Deleted

## 2011-11-30 DIAGNOSIS — Z7901 Long term (current) use of anticoagulants: Secondary | ICD-10-CM

## 2011-11-30 DIAGNOSIS — I4891 Unspecified atrial fibrillation: Secondary | ICD-10-CM

## 2011-11-30 LAB — POCT INR: INR: 1.1

## 2011-12-04 ENCOUNTER — Ambulatory Visit (INDEPENDENT_AMBULATORY_CARE_PROVIDER_SITE_OTHER): Payer: Medicare Other | Admitting: *Deleted

## 2011-12-04 DIAGNOSIS — Z7901 Long term (current) use of anticoagulants: Secondary | ICD-10-CM

## 2011-12-04 DIAGNOSIS — I4891 Unspecified atrial fibrillation: Secondary | ICD-10-CM

## 2011-12-04 LAB — POCT INR: INR: 2.7

## 2011-12-09 NOTE — Progress Notes (Signed)
[name] Bottoms, MD, [facility] ABIM Board Certified in Adult Cardiovascular Medicine,Internal Medicine and Critical Care Medicine    CC: Followup patient with ischemic cardio mapping, status post ICD in atrial fibrillation in normal sinus rhythm on sotalol  HPI:  The patient is an 78 year old male with coronary artery disease and peripheral vascular disease.  He has a history of atrial fibrillation but is currently not on anticoagulation.  Last office visit we discussed to start anticoagulant therapy after we determined the patient was not anemic.  Followup CBC showed no significant anemia.  The patient also had previously some lightheadedness and we had to adjust his diuretic therapy.  He reports no further symptoms of orthostasis.  He feels that he is actually much improved.  He does have some shortness of breath but relates this to pollen.  He has currently no wheezing.  He denies any chest pain either at rest or on exertion.  The patient received continuous followup for his carotid artery disease.He is status post head and neck cancer with radiation therapy and previously had poor appetite but this has improved on Megace.EKG was reviewed in the office today and the patient has no QTC prolongation.  PMH: reviewed and listed in Problem List in Electronic Records (and see below) Past Medical History  Diagnosis Date  . Hypertension   . Coronary artery disease      status post coronary bypass grafting, status post catheterization  July 2011 with 3 out of 3 grafts patent., status post bypass in 2000.   . Diabetes mellitus   . Other specified forms of chronic ischemic heart disease   . Syncope and collapse   . Automatic implantable cardiac defibrillator in situ     QRS duration 120 ms no typical left bundle branch block.  . Atrial fibrillation     Taking sotalol, intolerant to amiodarone  . Valvular heart disease     Mild to moderate mitral valve insufficiency and mild aortic insufficiency  .  Chronic airway obstruction, not elsewhere classified   . Old myocardial infarction   . Other and unspecified hyperlipidemia   . Congestive heart failure, unspecified     Ischemic cardiomyopathy ejection fraction 30-35%  . Cerebrovascular disease, unspecified   . Other mechanical complication of other internal orthopedic device, implant, and graft   . Carotid artery disease     status post left CEA  . Head and neck cancer     Receiving radiation therapy.  . Orthostatic hypotension    Past Surgical History  Procedure Date  . Repeat otif left knee with figure-of-eight tension band.   . Implantation of a dual-chamber implantable 06/18/2007  . Left carotid endarterectomy   . Coronary artery bypass graft 04/04/2006    Allergies/SH/FHX : available in Electronic Records for review  No Known Allergies History   Social History  . Marital Status: Married    Spouse Name: N/A    Number of Children: N/A  . Years of Education: N/A   Occupational History  . RETIRED    Social History Main Topics  . Smoking status: Former Smoker -- 2.0 packs/day for 60 years    Types: Cigarettes    Quit date: 08/06/1990  . Smokeless tobacco: Never Used  . Alcohol Use: No  . Drug Use: No  . Sexually Active: Not on file   Other Topics Concern  . Not on file   Social History Narrative   Pt does not get regular exercise.   No family history  on file.  Medications: Current Outpatient Prescriptions  Medication Sig Dispense Refill  . albuterol (PROAIR HFA) 108 (90 BASE) MCG/ACT inhaler Inhale 2 puffs into the lungs every 6 (six) hours as needed.      [name] albuterol (PROVENTIL) (2.5 MG/3ML) 0.083% nebulizer solution Take 2.5 mg by nebulization every 6 (six) hours as needed.      . famotidine (PEPCID) 20 MG tablet Take 40 mg by mouth every evening.      [name] FLONASE 50 MCG/ACT nasal spray USE (2) SPRAYS IN EACH NOSTRIL ONCE DAILY.  16 g  2  . furosemide (LASIX) 40 MG tablet Take 60 mg by mouth daily.      [name]  loratadine (CLARITIN) 10 MG tablet Take 10 mg by mouth daily.        . nitroGLYCERIN (NITROSTAT) 0.4 MG SL tablet Place 1 tablet (0.4 mg total) under the tongue every 5 (five) minutes as needed for chest pain.  25 each  3  . Omeprazole Magnesium 20.6 (20 BASE) MG CPDR Take 1 capsule by mouth daily.        . predniSONE (DELTASONE) 5 MG tablet Take 5 mg by mouth every other day.      . simvastatin (ZOCOR) 40 MG tablet Take 40 mg by mouth at bedtime.        . sotalol (BETAPACE) 160 MG tablet TAKE (1/2) TABLET BY MOUTH TWICE DAILY.  30 tablet  6  . spironolactone (ALDACTONE) 25 MG tablet TAKE 1 TABLET ONCE DAILY.  30 tablet  6  . valsartan (DIOVAN) 80 MG tablet Take 40 mg by mouth daily.        [name] aspirin EC 81 MG tablet Take 1 tablet (81 mg total) by mouth daily.      [name] warfarin (COUMADIN) 5 MG tablet Take 1 tablet (5 mg total) by mouth daily.  30 tablet  3    ROS: No nausea or vomiting. No fever or chills.No melena or hematochezia.No bleeding.No claudication  Physical Exam: BP 127/74  Pulse 69  Ht 5\' 9"  (1.753 m)  Wt 164 lb (74.39 kg)  BMI 24.22 kg/m2 General:Well-nourished white male in no distress. Neck:Normal carotid upstroke with well-healed left carotid endarterectomy scar.No definite carotid bruits  No thyromegaly no nodular thyroid JVP is 5-6 cm Lungs:Clear breath sounds bilaterally Without significant wheezing bilaterally Cardiac:Regular rate and rhythm with normal S1 and S2 no murmur rubs or gallops Vascular:No edema.  Normal distal pulses Skin:Warm and dry Physcologic:Normal affect  12lead WUJ:WJXBJY sinus rhythm with normal QTC on sotalol Limited bedside ECHO:N/A No images are attached to the encounter.   Assessment and Plan  Valvular heart disease No pathological murmurs on exam. We'll consider followup echo in one year.  Orthostatic hypotension Resolved patient is back on Lasix as well as spironolactone and is tolerating this well. Followup laboratory work showed no  evidence of prerenal azotemia or hyperkalemia  ISCHEMIC CARDIOMYOPATHY Ejection fraction 30-35% with no symptoms of heart failure. Catheterization 2011 3 out of 3 grafts patent.  VENTRICULAR TACHYCARDIA  Status post ICD with no clinical device discharges  HYPERTENSION Blood pressure well controlled will make no changes in his medical regimen  Coronary artery disease No recurrent chest pain. Stable. Patient has not required any nitroglycerin.  Carotid artery disease Followup carotid Dopplers needed in one year.  Atrial fibrillation Patient remains in normal sinus rhythm on sotalol which is tolerating well. We had a discussion about anticoagulation today. The patient is at high risk for stroke and  is willing to proceed with Coumadin. Is concerned that some of the newer oral anticoagulant agents we'll put him in the doughnut hole. If not we can always switch later to Xarelto for example. I discussed risks and benefits of Coumadin anticoagulation the patient and I start at 5 mg a day today with followup in the Coumadin clinic this Friday.    Patient Active Problem List  Diagnoses  . DM  . DYSLIPIDEMIA  . HYPERTENSION  . ISCHEMIC CARDIOMYOPATHY  . VENTRICULAR TACHYCARDIA  . WEAKNESS  . CAROTID BRUIT  . CLOSED FRACTURE OF PATELLA  . OTH MECH COMPL OTH INT ORTHOPEDIC DEVC IMPL&GFT  . IMPLANTATION OF DEFIBRILLATOR, HX OF  . Anemia  . Coronary artery disease  . Carotid artery disease  . Valvular heart disease  . Atrial fibrillation  . Head and neck cancer  . Orthostatic hypotension  . Encounter for long-term (current) use of anticoagulants

## 2011-12-11 ENCOUNTER — Ambulatory Visit (INDEPENDENT_AMBULATORY_CARE_PROVIDER_SITE_OTHER): Payer: Medicare Other | Admitting: *Deleted

## 2011-12-11 DIAGNOSIS — I4891 Unspecified atrial fibrillation: Secondary | ICD-10-CM

## 2011-12-11 DIAGNOSIS — Z7901 Long term (current) use of anticoagulants: Secondary | ICD-10-CM

## 2011-12-11 LAB — POCT INR: INR: 3.7

## 2011-12-14 ENCOUNTER — Encounter: Payer: Self-pay | Admitting: Internal Medicine

## 2011-12-14 ENCOUNTER — Ambulatory Visit (INDEPENDENT_AMBULATORY_CARE_PROVIDER_SITE_OTHER): Payer: Medicare Other | Admitting: *Deleted

## 2011-12-14 DIAGNOSIS — I472 Ventricular tachycardia, unspecified: Secondary | ICD-10-CM

## 2011-12-14 LAB — ICD DEVICE OBSERVATION
AL IMPEDENCE ICD: 337.5 Ohm
AL THRESHOLD: 0.75 V
ATRIAL PACING ICD: 90 pct
BAMS-0003: 70 {beats}/min
BATTERY VOLTAGE: 2.6019 V
CHARGE TIME: 12.5 s
HV IMPEDENCE: 48 Ohm
PACEART VT: 0
RV LEAD IMPEDENCE ICD: 600 Ohm
TOT-0006: 20081112000000
TOT-0007: 1
TOT-0008: 0
TOT-0009: 1
TOT-0010: 33
TZAT-0012SLOWVT: 200 ms
TZAT-0013SLOWVT: 3
TZAT-0018SLOWVT: NEGATIVE
TZAT-0020SLOWVT: 1 ms
TZON-0003SLOWVT: 340 ms
TZON-0005SLOWVT: 6
TZST-0001SLOWVT: 2
TZST-0001SLOWVT: 4
TZST-0003SLOWVT: 36 J

## 2011-12-14 NOTE — Progress Notes (Signed)
defib check in clinic  

## 2011-12-25 ENCOUNTER — Ambulatory Visit (INDEPENDENT_AMBULATORY_CARE_PROVIDER_SITE_OTHER): Payer: Medicare Other | Admitting: *Deleted

## 2011-12-25 DIAGNOSIS — Z7901 Long term (current) use of anticoagulants: Secondary | ICD-10-CM

## 2011-12-25 DIAGNOSIS — I4891 Unspecified atrial fibrillation: Secondary | ICD-10-CM

## 2011-12-25 LAB — POCT INR: INR: 2.2

## 2012-01-04 ENCOUNTER — Other Ambulatory Visit: Payer: Self-pay | Admitting: Cardiology

## 2012-01-11 ENCOUNTER — Ambulatory Visit (INDEPENDENT_AMBULATORY_CARE_PROVIDER_SITE_OTHER): Payer: Medicare Other | Admitting: *Deleted

## 2012-01-11 DIAGNOSIS — I4891 Unspecified atrial fibrillation: Secondary | ICD-10-CM

## 2012-01-11 DIAGNOSIS — Z7901 Long term (current) use of anticoagulants: Secondary | ICD-10-CM

## 2012-01-11 LAB — POCT INR: INR: 2.7

## 2012-01-14 ENCOUNTER — Encounter: Payer: Self-pay | Admitting: Internal Medicine

## 2012-01-26 ENCOUNTER — Other Ambulatory Visit: Payer: Self-pay | Admitting: Cardiology

## 2012-02-01 ENCOUNTER — Ambulatory Visit (INDEPENDENT_AMBULATORY_CARE_PROVIDER_SITE_OTHER): Payer: Medicare Other | Admitting: *Deleted

## 2012-02-01 DIAGNOSIS — Z7901 Long term (current) use of anticoagulants: Secondary | ICD-10-CM

## 2012-02-01 DIAGNOSIS — I4891 Unspecified atrial fibrillation: Secondary | ICD-10-CM

## 2012-02-01 LAB — POCT INR: INR: 1.9

## 2012-02-22 ENCOUNTER — Ambulatory Visit (INDEPENDENT_AMBULATORY_CARE_PROVIDER_SITE_OTHER): Payer: Medicare Other | Admitting: *Deleted

## 2012-02-22 DIAGNOSIS — I4891 Unspecified atrial fibrillation: Secondary | ICD-10-CM

## 2012-02-22 DIAGNOSIS — Z7901 Long term (current) use of anticoagulants: Secondary | ICD-10-CM

## 2012-03-03 ENCOUNTER — Ambulatory Visit: Payer: Medicare Other | Admitting: Surgery

## 2012-03-03 ENCOUNTER — Other Ambulatory Visit: Payer: Medicare Other

## 2012-03-05 ENCOUNTER — Encounter (HOSPITAL_COMMUNITY): Payer: Self-pay | Admitting: Internal Medicine

## 2012-03-05 ENCOUNTER — Encounter (INDEPENDENT_AMBULATORY_CARE_PROVIDER_SITE_OTHER): Payer: Medicare Other

## 2012-03-05 ENCOUNTER — Inpatient Hospital Stay (HOSPITAL_COMMUNITY)
Admission: AD | Admit: 2012-03-05 | Discharge: 2012-03-09 | DRG: 281 | Disposition: A | Discharge status: Home / Self Care | Payer: Medicare Other | Source: Other Acute Inpatient Hospital | Attending: Cardiology | Admitting: Cardiology

## 2012-03-05 DIAGNOSIS — I252 Old myocardial infarction: Secondary | ICD-10-CM

## 2012-03-05 DIAGNOSIS — I214 Non-ST elevation (NSTEMI) myocardial infarction: Secondary | ICD-10-CM | POA: Diagnosis present

## 2012-03-05 DIAGNOSIS — N189 Chronic kidney disease, unspecified: Secondary | ICD-10-CM | POA: Diagnosis present

## 2012-03-05 DIAGNOSIS — I2510 Atherosclerotic heart disease of native coronary artery without angina pectoris: Secondary | ICD-10-CM | POA: Diagnosis present

## 2012-03-05 DIAGNOSIS — E119 Type 2 diabetes mellitus without complications: Secondary | ICD-10-CM

## 2012-03-05 DIAGNOSIS — I10 Essential (primary) hypertension: Secondary | ICD-10-CM | POA: Diagnosis present

## 2012-03-05 DIAGNOSIS — I472 Ventricular tachycardia, unspecified: Principal | ICD-10-CM | POA: Diagnosis present

## 2012-03-05 DIAGNOSIS — Z7901 Long term (current) use of anticoagulants: Secondary | ICD-10-CM

## 2012-03-05 DIAGNOSIS — Z95810 Presence of automatic (implantable) cardiac defibrillator: Secondary | ICD-10-CM | POA: Diagnosis present

## 2012-03-05 DIAGNOSIS — C760 Malignant neoplasm of head, face and neck: Secondary | ICD-10-CM | POA: Diagnosis present

## 2012-03-05 DIAGNOSIS — I4729 Other ventricular tachycardia: Principal | ICD-10-CM | POA: Diagnosis present

## 2012-03-05 DIAGNOSIS — I129 Hypertensive chronic kidney disease with stage 1 through stage 4 chronic kidney disease, or unspecified chronic kidney disease: Secondary | ICD-10-CM | POA: Diagnosis present

## 2012-03-05 DIAGNOSIS — I2589 Other forms of chronic ischemic heart disease: Secondary | ICD-10-CM | POA: Diagnosis present

## 2012-03-05 DIAGNOSIS — I739 Peripheral vascular disease, unspecified: Secondary | ICD-10-CM | POA: Diagnosis present

## 2012-03-05 DIAGNOSIS — I2582 Chronic total occlusion of coronary artery: Secondary | ICD-10-CM | POA: Diagnosis present

## 2012-03-05 DIAGNOSIS — Z87891 Personal history of nicotine dependence: Secondary | ICD-10-CM

## 2012-03-05 DIAGNOSIS — I509 Heart failure, unspecified: Secondary | ICD-10-CM | POA: Diagnosis present

## 2012-03-05 DIAGNOSIS — I6529 Occlusion and stenosis of unspecified carotid artery: Secondary | ICD-10-CM

## 2012-03-05 DIAGNOSIS — IMO0002 Reserved for concepts with insufficient information to code with codable children: Secondary | ICD-10-CM

## 2012-03-05 DIAGNOSIS — R079 Chest pain, unspecified: Secondary | ICD-10-CM

## 2012-03-05 DIAGNOSIS — J449 Chronic obstructive pulmonary disease, unspecified: Secondary | ICD-10-CM | POA: Diagnosis present

## 2012-03-05 DIAGNOSIS — Z951 Presence of aortocoronary bypass graft: Secondary | ICD-10-CM

## 2012-03-05 DIAGNOSIS — I5022 Chronic systolic (congestive) heart failure: Secondary | ICD-10-CM | POA: Diagnosis present

## 2012-03-05 DIAGNOSIS — Z79899 Other long term (current) drug therapy: Secondary | ICD-10-CM

## 2012-03-05 DIAGNOSIS — E785 Hyperlipidemia, unspecified: Secondary | ICD-10-CM | POA: Diagnosis present

## 2012-03-05 DIAGNOSIS — Z7982 Long term (current) use of aspirin: Secondary | ICD-10-CM

## 2012-03-05 DIAGNOSIS — I4891 Unspecified atrial fibrillation: Secondary | ICD-10-CM | POA: Diagnosis present

## 2012-03-05 DIAGNOSIS — J4489 Other specified chronic obstructive pulmonary disease: Secondary | ICD-10-CM | POA: Diagnosis present

## 2012-03-05 DIAGNOSIS — R0989 Other specified symptoms and signs involving the circulatory and respiratory systems: Secondary | ICD-10-CM

## 2012-03-05 HISTORY — DX: Ventricular tachycardia: I47.2

## 2012-03-05 HISTORY — DX: Ventricular tachycardia, unspecified: I47.20

## 2012-03-05 LAB — MRSA PCR SCREENING: MRSA by PCR: NEGATIVE

## 2012-03-05 LAB — CBC WITH DIFFERENTIAL/PLATELET
Basophils Relative: 1 % (ref 0–1)
Eosinophils Absolute: 0.1 10*3/uL (ref 0.0–0.7)
HCT: 37.8 % — ABNORMAL LOW (ref 39.0–52.0)
Hemoglobin: 12.7 g/dL — ABNORMAL LOW (ref 13.0–17.0)
MCH: 30 pg (ref 26.0–34.0)
MCHC: 33.6 g/dL (ref 30.0–36.0)
Monocytes Absolute: 0.6 10*3/uL (ref 0.1–1.0)
Monocytes Relative: 8 % (ref 3–12)

## 2012-03-05 MED ORDER — FAMOTIDINE 40 MG PO TABS
40.0000 mg | ORAL_TABLET | Freq: Every evening | ORAL | Status: DC
  Administered 2012-03-06 – 2012-03-08 (×3): 40 mg via ORAL
  Filled 2012-03-05 (×5): qty 1

## 2012-03-05 MED ORDER — SOTALOL HCL 80 MG PO TABS
80.0000 mg | ORAL_TABLET | Freq: Two times a day (BID) | ORAL | Status: DC
  Administered 2012-03-06: 80 mg via ORAL
  Filled 2012-03-05 (×3): qty 1

## 2012-03-05 MED ORDER — IRBESARTAN 75 MG PO TABS
75.0000 mg | ORAL_TABLET | Freq: Every day | ORAL | Status: DC
  Administered 2012-03-06 – 2012-03-09 (×4): 75 mg via ORAL
  Filled 2012-03-05 (×4): qty 1

## 2012-03-05 MED ORDER — ASPIRIN EC 81 MG PO TBEC: 81.0000 mg | DELAYED_RELEASE_TABLET | Freq: Every day | ORAL | Status: DC

## 2012-03-05 MED ORDER — FLUTICASONE PROPIONATE 50 MCG/ACT NA SUSP
1.0000 | Freq: Every day | NASAL | Status: DC
  Administered 2012-03-06 – 2012-03-09 (×4): 1 via NASAL
  Filled 2012-03-05: qty 16

## 2012-03-05 MED ORDER — SPIRONOLACTONE 25 MG PO TABS
25.0000 mg | ORAL_TABLET | Freq: Every day | ORAL | Status: DC
  Filled 2012-03-05: qty 1

## 2012-03-05 MED ORDER — ALBUTEROL SULFATE (5 MG/ML) 0.5% IN NEBU: 2.5000 mg | INHALATION_SOLUTION | RESPIRATORY_TRACT | Status: DC | PRN

## 2012-03-05 MED ORDER — ONDANSETRON HCL 4 MG/2ML IJ SOLN: 4.0000 mg | Freq: Four times a day (QID) | INTRAMUSCULAR | Status: DC | PRN

## 2012-03-05 MED ORDER — ASPIRIN EC 81 MG PO TBEC
81.0000 mg | DELAYED_RELEASE_TABLET | Freq: Every day | ORAL | Status: DC
  Administered 2012-03-06 – 2012-03-09 (×4): 81 mg via ORAL
  Filled 2012-03-05 (×4): qty 1

## 2012-03-05 MED ORDER — PREDNISONE 5 MG PO TABS
5.0000 mg | ORAL_TABLET | ORAL | Status: DC
  Administered 2012-03-07 – 2012-03-09 (×2): 5 mg via ORAL
  Filled 2012-03-05 (×2): qty 1

## 2012-03-05 MED ORDER — NITROGLYCERIN 0.4 MG SL SUBL: 0.4000 mg | SUBLINGUAL_TABLET | SUBLINGUAL | Status: DC | PRN

## 2012-03-05 MED ORDER — ACETAMINOPHEN 325 MG PO TABS
650.0000 mg | ORAL_TABLET | ORAL | Status: DC | PRN
  Administered 2012-03-07: 650 mg via ORAL
  Filled 2012-03-05: qty 2

## 2012-03-05 MED ORDER — FUROSEMIDE 40 MG PO TABS
60.0000 mg | ORAL_TABLET | Freq: Every day | ORAL | Status: DC
  Administered 2012-03-06 – 2012-03-09 (×3): 60 mg via ORAL
  Filled 2012-03-05 (×4): qty 1

## 2012-03-05 MED ORDER — LORATADINE 10 MG PO TABS
10.0000 mg | ORAL_TABLET | Freq: Every day | ORAL | Status: DC
  Administered 2012-03-06 – 2012-03-09 (×4): 10 mg via ORAL
  Filled 2012-03-05 (×4): qty 1

## 2012-03-05 MED ORDER — SOTALOL HCL 80 MG PO TABS: 160.0000 mg | ORAL_TABLET | Freq: Two times a day (BID) | ORAL | Status: DC

## 2012-03-05 MED ORDER — SIMVASTATIN 40 MG PO TABS
40.0000 mg | ORAL_TABLET | Freq: Every day | ORAL | Status: DC
  Administered 2012-03-05 – 2012-03-08 (×4): 40 mg via ORAL
  Filled 2012-03-05 (×5): qty 1

## 2012-03-05 MED ORDER — HEPARIN SODIUM (PORCINE) 5000 UNIT/ML IJ SOLN
5000.0000 [IU] | Freq: Three times a day (TID) | INTRAMUSCULAR | Status: DC
  Administered 2012-03-05: 5000 [IU] via SUBCUTANEOUS
  Filled 2012-03-05 (×2): qty 1

## 2012-03-05 MED ORDER — INSULIN ASPART 100 UNIT/ML ~~LOC~~ SOLN: 0.0000 [IU] | Freq: Three times a day (TID) | SUBCUTANEOUS | Status: DC

## 2012-03-05 MED ORDER — ALBUTEROL SULFATE HFA 108 (90 BASE) MCG/ACT IN AERS
2.0000 | INHALATION_SPRAY | Freq: Four times a day (QID) | RESPIRATORY_TRACT | Status: DC | PRN
  Filled 2012-03-05 (×2): qty 6.7

## 2012-03-05 NOTE — H&P (Addendum)
Cardiologist: Dr. Andee Lineman EP: Dr. Johney Frame  HPI:  [name] is an 78 y/o male with DM2, COPD, PVD, PAF, CAD s/p CABG 2007, VT (maintained on Sotalol) and iCM EF 30-35% transferred from [facility] for further evaluation after presenting with slow VT for which he was cardioverted.   Last cath in 7/11 after presenting with ICD shock.   Cath at that time:  LM 70-80% LAD long stenosis LCX 70% mid RCA 50-60% mid, Total distally LIMA -LAD patent SVG - LCX-OM: patent SVG R [id number]  Has h/o VT and previously maintained on amiodarone but felt to be intolerant due to nausea. Switched to Sotalol. Has been followed by Dr. Johney Frame. Last shock in May. Has felt tired lately but remains active with no CP or significant HF symptoms.   Today was out with his wife and developed palpitations and presyncope followed by nausea and CP radiating down L arm. Went to [place]. ER ECG with slow VT at 158. Was diltiazem 10 mg, amio 150mg  and metoprolol 5mg . Apparently became hypotensive and then was externally cardioverted. Has been in SR since.    Review of Systems:     Cardiac Review of Systems: {Y] = yes [ ]  = no  Chest Pain [    ]  Resting SOB [   ] Exertional SOB  [ y ]  Orthopnea [  ]   Pedal Edema [   ]    Palpitations [  ] Syncope  [  ]   Presyncope [   ]  General Review of Systems: [Y] = yes [  ]=no Constitional: recent weight change [  ]; anorexia [  ]; fatigue [  ]; nausea [  ]; night sweats [  ]; fever [  ]; or chills [  ];                                                                                                                                          Dental: poor dentition[  ];   Eye : blurred vision [  ]; diplopia [   ]; vision changes [  ];  Amaurosis fugax[  ]; Resp: cough [  ];  wheezing[  ];  hemoptysis[  ]; shortness of breath[  ]; paroxysmal nocturnal dyspnea[  ]; dyspnea on exertion[y  ]; or orthopnea[  ];  GI:  gallstones[  ], vomiting[  ];  dysphagia[  ]; melena[  ];   hematochezia [  ]; heartburn[  ];   Hx of  Colonoscopy[  ]; GU: kidney stones [  ]; hematuria[  ];   dysuria [  ];  nocturia[  ];  history of     obstruction [  ];                 Skin: rash, swelling[  ];, hair loss[  ];  peripheral edema[  ];  or itching[  ]; Musculosketetal: myalgias[  ];  joint swelling[  ];  joint erythema[  ];  joint pain[y  ];  back pain[  ];  Heme/Lymph: bruising[  ];  bleeding[  ];  anemia[  ];  Neuro: TIA[  ];  headaches[  ];  stroke[  ];  vertigo[  ];  seizures[  ];   paresthesias[  ];  difficulty walking[  ];  Psych:depression[  ]; anxiety[  ];  Endocrine: diabetes[y  ];  thyroid dysfunction[  ];   Past Medical History  Diagnosis Date  . Hypertension   . Coronary artery disease      status post coronary bypass grafting, status post catheterization  July 2011 with 3 out of 3 grafts patent., status post bypass in 2000.   . Diabetes mellitus   . Other specified forms of chronic ischemic heart disease   . Syncope and collapse   . Automatic implantable cardiac defibrillator in situ     QRS duration 120 ms no typical left bundle branch block.  . Atrial fibrillation     Taking sotalol, intolerant to amiodarone  . Valvular heart disease     Mild to moderate mitral valve insufficiency and mild aortic insufficiency  . Chronic airway obstruction, not elsewhere classified   . Old myocardial infarction   . Other and unspecified hyperlipidemia   . Congestive heart failure, unspecified     Ischemic cardiomyopathy ejection fraction 30-35%  . Cerebrovascular disease, unspecified   . Other mechanical complication of other internal orthopedic device, implant, and graft   . Carotid artery disease     status post left CEA  . Head and neck cancer     Receiving radiation therapy.  . Orthostatic hypotension     Medications Prior to Admission  Medication Sig Dispense Refill  . albuterol (PROAIR HFA) 108 (90 BASE) MCG/ACT inhaler Inhale 2 puffs into the lungs every 6 (six)  hours as needed.      [name] albuterol (PROVENTIL) (2.5 MG/3ML) 0.083% nebulizer solution Take 2.5 mg by nebulization every 6 (six) hours as needed.      [name] ALDACTONE 25 MG tablet TAKE 1 TABLET ONCE DAILY.  30 each  6  . aspirin EC 81 MG tablet Take 1 tablet (81 mg total) by mouth daily.      [name] BETAPACE 160 MG tablet TAKE (1/2) TABLET BY MOUTH TWICE DAILY.  15 each  6  . famotidine (PEPCID) 20 MG tablet Take 40 mg by mouth every evening.      [name] FLONASE 50 MCG/ACT nasal spray USE (2) SPRAYS IN EACH NOSTRIL ONCE DAILY.  16 g  2  . furosemide (LASIX) 40 MG tablet Take 60 mg by mouth daily.      [name] loratadine (CLARITIN) 10 MG tablet Take 10 mg by mouth daily.        . nitroGLYCERIN (NITROSTAT) 0.4 MG SL tablet Place 1 tablet (0.4 mg total) under the tongue every 5 (five) minutes as needed for chest pain.  25 each  3  . Omeprazole Magnesium 20.6 (20 BASE) MG CPDR Take 1 capsule by mouth daily.        . predniSONE (DELTASONE) 5 MG tablet Take 5 mg by mouth every other day.      . simvastatin (ZOCOR) 40 MG tablet Take 40 mg by mouth at bedtime.        . valsartan (DIOVAN) 80 MG tablet Take 40 mg by mouth daily.        [name]  warfarin (COUMADIN) 5 MG tablet Take 1 tablet (5 mg total) by mouth daily.  30 tablet  3     No Known Allergies  History   Social History  . Marital Status: Married    Spouse Name: N/A    Number of Children: N/A  . Years of Education: N/A   Occupational History  . RETIRED    Social History Main Topics  . Smoking status: Former Smoker -- 2.0 packs/day for 60 years    Types: Cigarettes    Quit date: 08/06/1990  . Smokeless tobacco: Never Used  . Alcohol Use: No  . Drug Use: No  . Sexually Active: Not on file   Other Topics Concern  . Not on file   Social History Narrative   Pt does not get regular exercise.    History reviewed. No pertinent family history.  PHYSICAL EXAM: Filed Vitals:   03/05/12 2043  BP: 105/66  Temp: 97.8 F (36.6 C)  Resp: 19   General:   Well appearing. No respiratory difficulty HEENT: normal Neck: supple. no JVD. Carotids 2+ bilat; no bruits. No lymphadenopathy or thryomegaly appreciated. L CEA scar Cor: PMI laterally displaced. Distant HSRegular rate & rhythm. No rubs, gallops or murmurs. Lungs: clear with decreased air movement throughout Abdomen: soft, nontender, nondistended. No hepatosplenomegaly. No bruits or masses. Good bowel sounds. Extremities: no cyanosis, clubbing, rash, edema. Missing fingers on R hand. Neuro: alert & oriented x 3, cranial nerves grossly intact. moves all 4 extremities w/o difficulty. Affect pleasant.  ECG: VT (RBBB morphology) 158. F/u A pacing 74 IVCD. Inferolateral TWI (no change from previous)  Labs Cr 1.3 K 4.8  ASSESSMENT:  1) VT with external cardioversion      --h/o VT maintained on sotalol      --intolerant of amio 2) CAD s/p CABG 2007    --grafts patent on cath 2007 3) Chronic systolic HF due to iCM EF 30-35% 4) COPD 5) PAD s/p carotid endarterectomy 6) PAF 7) DM2 8) Chronic renal failure 9) CP due to #1 and #2   PLAN/DISCUSSION:  He has recurrent VT with a rate that is likely below his ICD detection zone. By history, it sounds like his VT preceded his CP and his CP was likely demand ischemia and not the primary issue. HF well compensated.  For now, will d/c the IV amio that was started at [facility] and continue his Sotalol. Will cycle cardiac markers. EPto see tomorrow to decide on need for cath and consideration of another trial of amio vs consideration of VT ablation.  Glorene Leitzke,MD 10:07 PM

## 2012-03-06 DIAGNOSIS — I4891 Unspecified atrial fibrillation: Secondary | ICD-10-CM

## 2012-03-06 DIAGNOSIS — I214 Non-ST elevation (NSTEMI) myocardial infarction: Secondary | ICD-10-CM | POA: Diagnosis present

## 2012-03-06 LAB — MAGNESIUM: Magnesium: 2.4 mg/dL (ref 1.5–2.5)

## 2012-03-06 LAB — GLUCOSE, CAPILLARY
Glucose-Capillary: 85 mg/dL (ref 70–99)
Glucose-Capillary: 99 mg/dL (ref 70–99)

## 2012-03-06 LAB — COMPREHENSIVE METABOLIC PANEL
Alkaline Phosphatase: 46 U/L (ref 39–117)
BUN: 20 mg/dL (ref 6–23)
Calcium: 9.4 mg/dL (ref 8.4–10.5)
GFR calc Af Amer: 74 mL/min — ABNORMAL LOW (ref >90)
GFR calc non Af Amer: 64 mL/min — ABNORMAL LOW (ref >90)
Glucose, Bld: 90 mg/dL (ref 70–99)
Potassium: 3.9 mEq/L (ref 3.5–5.1)
Total Protein: 6.9 g/dL (ref 6.0–8.3)

## 2012-03-06 LAB — PROTIME-INR
INR: 1.85 — ABNORMAL HIGH (ref 0.00–1.49)
Prothrombin Time: 21.7 seconds — ABNORMAL HIGH (ref 11.6–15.2)

## 2012-03-06 LAB — CARDIAC PANEL(CRET KIN+CKTOT+MB+TROPI)
CK, MB: 38.3 ng/mL (ref 0.3–4.0)
Relative Index: 11.6 — ABNORMAL HIGH (ref 0.0–2.5)
Total CK: 321 U/L — ABNORMAL HIGH (ref 7–232)
Troponin I: 11.13 ng/mL (ref <0.30)
Troponin I: 8.81 ng/mL (ref <0.30)

## 2012-03-06 LAB — LIPID PANEL
LDL Cholesterol: 67 mg/dL (ref 0–99)
Total CHOL/HDL Ratio: 4.2 RATIO

## 2012-03-06 LAB — HEMOGLOBIN A1C: Mean Plasma Glucose: 128 mg/dL — ABNORMAL HIGH (ref <117)

## 2012-03-06 MED ORDER — SPIRONOLACTONE 25 MG PO TABS
25.0000 mg | ORAL_TABLET | Freq: Every day | ORAL | Status: DC
  Administered 2012-03-08 – 2012-03-09 (×2): 25 mg via ORAL
  Filled 2012-03-06 (×3): qty 1

## 2012-03-06 MED ORDER — SODIUM CHLORIDE 0.9 % IV SOLN: 250.0000 mL | INTRAVENOUS | Status: DC | PRN

## 2012-03-06 MED ORDER — CLOPIDOGREL BISULFATE 75 MG PO TABS
75.0000 mg | ORAL_TABLET | Freq: Every day | ORAL | Status: DC
  Administered 2012-03-06 – 2012-03-08 (×3): 75 mg via ORAL
  Filled 2012-03-06 (×3): qty 1

## 2012-03-06 MED ORDER — HEPARIN (PORCINE) IN NACL 100-0.45 UNIT/ML-% IJ SOLN
1100.0000 [IU]/h | INTRAMUSCULAR | Status: DC
  Administered 2012-03-06: 900 [IU]/h via INTRAVENOUS
  Administered 2012-03-07: 1100 [IU]/h via INTRAVENOUS
  Filled 2012-03-06 (×3): qty 250

## 2012-03-06 MED ORDER — METOPROLOL TARTRATE 12.5 MG HALF TABLET
12.5000 mg | ORAL_TABLET | Freq: Two times a day (BID) | ORAL | Status: DC
  Filled 2012-03-06 (×3): qty 1

## 2012-03-06 MED ORDER — CLOPIDOGREL BISULFATE 300 MG PO TABS
300.0000 mg | ORAL_TABLET | Freq: Once | ORAL | Status: AC
  Administered 2012-03-06: 300 mg via ORAL
  Filled 2012-03-06: qty 1

## 2012-03-06 MED ORDER — SODIUM CHLORIDE 0.9 % IV SOLN: 1.0000 mL/kg/h | INTRAVENOUS | Status: DC

## 2012-03-06 MED ORDER — METOPROLOL TARTRATE 12.5 MG HALF TABLET
12.5000 mg | ORAL_TABLET | Freq: Two times a day (BID) | ORAL | Status: DC
  Administered 2012-03-07 – 2012-03-09 (×5): 12.5 mg via ORAL
  Filled 2012-03-06 (×7): qty 1

## 2012-03-06 MED ORDER — SODIUM CHLORIDE 0.9 % IJ SOLN
3.0000 mL | Freq: Two times a day (BID) | INTRAMUSCULAR | Status: DC
  Administered 2012-03-06 – 2012-03-07 (×3): 3 mL via INTRAVENOUS

## 2012-03-06 MED ORDER — SODIUM CHLORIDE 0.9 % IJ SOLN: 3.0000 mL | INTRAMUSCULAR | Status: DC | PRN

## 2012-03-06 MED ORDER — SOTALOL HCL 120 MG PO TABS
120.0000 mg | ORAL_TABLET | Freq: Two times a day (BID) | ORAL | Status: DC
  Administered 2012-03-06 – 2012-03-09 (×6): 120 mg via ORAL
  Filled 2012-03-06 (×7): qty 1

## 2012-03-06 NOTE — Care Management Note (Signed)
    Page 1 of 1   03/06/2012     11:22:31 AM   CARE MANAGEMENT NOTE 03/06/2012  Patient:  [name], [name]   Account Number:  [id number]  Date Initiated:  03/06/2012  Documentation initiated by:  Junius Creamer  Subjective/Objective Assessment:   adm w ch pain, elev card enzymes     Action/Plan:   lives w wife, pcp dr Onalee Hua tapper   Anticipated DC Date:     Anticipated DC Plan:        DC Planning Services  CM consult      Choice offered to / List presented to:             Status of service:   Medicare Important Message given?   (If response is "NO", the following Medicare IM given date fields will be blank) Date Medicare IM given:   Date Additional Medicare IM given:    Discharge Disposition:    Per UR Regulation:  Reviewed for med. necessity/level of care/duration of stay  If discussed at Long Length of Stay Meetings, dates discussed:    Comments:  8/1 11:21a debbie Trena Dunavan rn,bsn [phone]

## 2012-03-06 NOTE — Progress Notes (Signed)
Cardiac markers significantly elevated suggested of NSTEMI. CP free.   Will need cath this admit. Start heparin when INR < 2.0. Load Plavix. Start low-dose b-blocker.  Daniel Bensimhon,MD 1:12 AM

## 2012-03-06 NOTE — Consult Note (Signed)
ELECTROPHYSIOLOGY CONSULT NOTE    Patient ID: [name] MRN: [id number], DOB/AGE: 1929-06-17 78 y.o.  Admit date: 03/05/2012 Date of Consult: 03-06-2012  Primary Physician: [name] Boston, MD Primary Cardiologist: [name] Bunting, MD Electrophysiologist: [name] Range, MD  Reason for Consultation: Ventricular tachycardia  HPI:  [name] is a 78 year old male with an ischemic cardiomyopathy who is status post ICD placement in 2008.  He presented to [facility] yesterday with palpitations, nausea, and chest pain. He was found to be in ventricular tachycardia at a rate of 158 and was externally cardioverted.  His chest pain quickly resolved post cardioversion.  He was transferred to [facility] for further evaluation and treatment.  EP has been asked to evaluated.   Past medical history is significant for hypertension, coronary artery disease status post CABG in 2011, diabetes, atrial fibrillation, ventricular tachycardia and orthostatic hypotension.  He VT was managed in the past with Amiodarone but was discontinued because of nausea.  He was switched to Sotalol (has been on since at least January of 2012).   He has been doing well with no chest pain or shortness of breath.  He reports recent fatigue.   Cardiac enzymes this admission have been positive with a peak troponin of 11.13 (last 8.81).  INR pending this morning, was 2.11 yesterday.  BNP 4955.   Past Medical History  Diagnosis Date  . Hypertension   . Coronary artery disease      status post coronary bypass grafting, status post catheterization  July 2011 with 3 out of 3 grafts patent., status post bypass in 2000.   . Diabetes mellitus   . Other specified forms of chronic ischemic heart disease   . Syncope and collapse   . Automatic implantable cardiac defibrillator in situ     QRS duration 120 ms no typical left bundle branch block.  . Atrial fibrillation     Taking sotalol, intolerant to amiodarone  . Valvular heart  disease     Mild to moderate mitral valve insufficiency and mild aortic insufficiency  . Chronic airway obstruction, not elsewhere classified   . Old myocardial infarction   . Other and unspecified hyperlipidemia   . Congestive heart failure, unspecified     Ischemic cardiomyopathy ejection fraction 30-35%  . Cerebrovascular disease, unspecified   . Other mechanical complication of other internal orthopedic device, implant, and graft   . Carotid artery disease     status post left CEA  . Head and neck cancer     Receiving radiation therapy.  . Orthostatic hypotension     Surgical History:  Past Surgical History  Procedure Date  . Repeat otif left knee with figure-of-eight tension band.   . Implantation of a dual-chamber implantable 06/18/2007  . Left carotid endarterectomy   . Coronary artery bypass graft 04/04/2006     Prescriptions prior to admission  Medication Sig Dispense Refill  . albuterol (PROAIR HFA) 108 (90 BASE) MCG/ACT inhaler Inhale 2 puffs into the lungs every 6 (six) hours as needed. For shortness of breath      . albuterol (PROVENTIL) (2.5 MG/3ML) 0.083% nebulizer solution Take 2.5 mg by nebulization every 6 (six) hours as needed.      [name] ALDACTONE 25 MG tablet TAKE 1 TABLET ONCE DAILY.  30 each  6  . aspirin EC 81 MG tablet Take 1 tablet (81 mg total) by mouth daily.      [name] BETAPACE 160 MG tablet TAKE (1/2) TABLET BY MOUTH  TWICE DAILY.  15 each  6  . famotidine (PEPCID) 20 MG tablet Take 40 mg by mouth every evening.      [name] FLONASE 50 MCG/ACT nasal spray USE (2) SPRAYS IN EACH NOSTRIL ONCE DAILY.  16 g  2  . furosemide (LASIX) 40 MG tablet Take 60 mg by mouth daily.      [name] loratadine (CLARITIN) 10 MG tablet Take 10 mg by mouth daily. For allergies      . nitroGLYCERIN (NITROSTAT) 0.4 MG SL tablet Place 1 tablet (0.4 mg total) under the tongue every 5 (five) minutes as needed for chest pain.  25 each  3  . predniSONE (DELTASONE) 5 MG tablet Take 5 mg by mouth every  other day.      . simvastatin (ZOCOR) 40 MG tablet Take 40 mg by mouth at bedtime.        . valsartan (DIOVAN) 80 MG tablet Take 40 mg by mouth daily.        [name] warfarin (COUMADIN) 5 MG tablet Take 0.5-5 mg by mouth daily. Mondays and fridays take 5mg  and rest of days take half of the 5mg  tablet.       Inpatient Medications:    . aspirin EC  81 mg Oral Daily  . clopidogrel  75 mg Oral Q breakfast  . famotidine  40 mg Oral QPM  . fluticasone  1 spray Each Nare Daily  . furosemide  60 mg Oral Daily  . insulin aspart  0-9 Units Subcutaneous TID WC  . irbesartan  75 mg Oral Daily  . loratadine  10 mg Oral Daily  . metoprolol tartrate  12.5 mg Oral BID  . predniSONE  5 mg Oral QODAY  . simvastatin  40 mg Oral QHS  . sotalol  80 mg Oral Q12H  . spironolactone  25 mg Oral Daily   Allergies: No Known Allergies  History   Social History  . Marital Status: Married    Spouse Name: N/A    Number of Children: N/A  . Years of Education: N/A   Occupational History  . RETIRED    Social History Main Topics  . Smoking status: Former Smoker -- 2.0 packs/day for 60 years    Types: Cigarettes    Quit date: 08/06/1990  . Smokeless tobacco: Never Used  . Alcohol Use: No  . Drug Use: No  . Sexually Active: Not on file   Other Topics Concern  . Not on file   Social History Narrative   Pt does not get regular exercise.    History reviewed. No pertinent family history.   Physical Exam: Filed Vitals:   03/06/12 0800 03/06/12 0900 03/06/12 1000 03/06/12 1100  BP: 96/58 89/55 96/59  103/63  Pulse: 77 78 78 77  Temp:      TempSrc:      Resp: 12 14 13 21   Height:      Weight:      SpO2: 99% 99% 100% 100%    GEN- The patient is elderly appearing, alert and oriented x 3 today.   Head- normocephalic, atraumatic Eyes-  Sclera clear, conjunctiva pink Ears- hearing intact Oropharynx- clear Neck- supple  Lungs- Clear to ausculation bilaterally, normal work of breathing Heart- Regular  rate and rhythm,  GI- soft, NT, ND, + BS Extremities- no clubbing, cyanosis, or edema Skin- no rash or lesion Psych- euthymic mood, full affect Neuro- strength and sensation are intact   Labs:   Lab Results  Component Value Date  WBC 7.4 03/05/2012   HGB 12.7* 03/05/2012   HCT 37.8* 03/05/2012   MCV 89.2 03/05/2012   PLT 208 03/05/2012    Lab 03/05/12 2236  NA 140  K 3.9  CL 101  CO2 26  BUN 20  CREATININE 1.07  CALCIUM 9.4  PROT 6.9  BILITOT 0.5  ALKPHOS 46  ALT 16  AST 48*  GLUCOSE 90   Lab Results  Component Value Date   CKTOTAL 321* 03/06/2012   CKMB 38.3* 03/06/2012   TROPONINI 8.81* 03/06/2012   Lab Results  Component Value Date   CHOL 134 03/06/2012   Lab Results  Component Value Date   HDL 32* 03/06/2012   Lab Results  Component Value Date   LDLCALC 67 03/06/2012   Lab Results  Component Value Date   TRIG 176* 03/06/2012   Lab Results  Component Value Date   CHOLHDL 4.2 03/06/2012    TELEMETRY: a pacing with occasional PVC's, no further sustained VT  EKG: a pacing with intrinsic ventricular conduction rate 75 .23/.13/.45  ELECTROPHYSIOLOGY DEVICE INTERROGATION   Device Manufacturer: STJ Model Number: [id number]  DOI: 06-18-07 Implanting physician: Ladona Ridgel  Device following physician: Dezzie Badilla  Battery Voltage: 2.60  Charge Time: 13.4 Estimated Longevity: 2.6 years     RA Lead (1699) RV Lead (7121)    Amplitude 2.7 11.7  Impedence 350 690  Threshold 0.75@0 .5 1.75@0 .5  HV impedence  52   Episodes:  High Atrial rates: 5 mode switch episodes <1% of total time. Longest episode 1 hour 10 minutes  High Ventricular rates: 4 VT episodes, terminated with ATP- last January 26, 2012   Programmed parameters:  Huston Foley parameters:  Mode: DDDR Lower Rate: 70 Upper rate: 100  PAV: 275   SAV: 275  Tachy parameters:  VT1 zone: Rate: 176 Therapies: ATP X6 then shocks    VF zone:   Rate: 240 Therapies: Shocks    Assessment and Plan: 1. VT- pt with symptomatic sustained  VT at 156 bpm.  This was RBBB superior axis Vt.  At this point, I will increase sotalol to 120mg  BID and follow QTc.  In addition, ICD will be adjusted with a VT zone at 150-180 bpm with ATP therapies to be delivered. He should be cathed once INR subtherapeutic to evaluate for ischemic cause of VT.  2. NSTEMI- likely demand ischemic due to VT.  Will proceed with cath once INR is subtherapeutic (hopefully tomorrow).  3. Afib- coumadin on hold for cath  4. Ischemic CM- continue current medical therapy  Fayrene Fearing Lakeria Starkman,MD

## 2012-03-06 NOTE — Progress Notes (Addendum)
CRITICAL VALUE ALERT  Critical value received: CKMB 40.8, Troponin 11.13  Date of notification:  03/06/2012  Time of notification:  12:26 AM  Critical value read back:yes  Nurse who received alert:  Gregor Hams  MD notified (1st page):  Dr. Gala Romney  Time of first page:  0059  Responding MD:  Dr. Gala Romney  Time MD responded:  [phone]

## 2012-03-06 NOTE — Progress Notes (Signed)
ANTICOAGULATION CONSULT NOTE - Follow Up Consult  Pharmacy Consult for heparin Indication: CP/ACS/Vtach/PAF  Labs:  Basename 03/06/12 2144 03/06/12 1110 03/06/12 0250 03/05/12 2236 03/05/12 2235  HGB -- -- -- 12.7* --  HCT -- -- -- 37.8* --  PLT -- -- -- 208 --  APTT -- -- -- 36 --  LABPROT -- 21.7* -- 24.0* --  INR -- 1.85* -- 2.11* --  HEPARINUNFRC 0.14* -- -- -- --  CREATININE -- -- -- 1.07 --  CKTOTAL -- -- 321* -- 351*  CKMB -- -- 38.3* -- 40.8*  TROPONINI -- -- 8.81* -- 11.13*    Assessment: 78yo male subtherapeutic on heparin with initial dosing for subtherapeutic INR with plans for cath.  Goal of Therapy:  Heparin level 0.3-0.7 units/ml   Plan:  Will increase heparin gtt by 3 units/kg/hr to 1100 units/hr and check level in 8hr.  Colleen Can PharmD BCPS 03/06/2012,11:08 PM

## 2012-03-06 NOTE — Progress Notes (Signed)
ANTICOAGULATION CONSULT NOTE - Initial Consult  Pharmacy Consult for heparin when inr<2 for nstemi  No Known Allergies  Patient Measurements: Height: 5\' 9"  (175.3 cm) Weight: 167 lb 1.7 oz (75.8 kg) IBW/kg (Calculated) : 70.7  Heparin Dosing Weight:   Vital Signs: Temp: 98 F (36.7 C) (07/31 2357) Temp src: Oral (07/31 2357) BP: 97/63 mmHg (08/01 0030) Pulse Rate: 57  (08/01 0030)  Labs:  Basename 03/05/12 2236 03/05/12 2235  HGB 12.7* --  HCT 37.8* --  PLT 208 --  APTT 36 --  LABPROT 24.0* --  INR 2.11* --  HEPARINUNFRC -- --  CREATININE 1.07 --  CKTOTAL -- 351*  CKMB -- 40.8*  TROPONINI -- 11.13*    Estimated Creatinine Clearance: 55.1 ml/min (by C-G formula based on Cr of 1.07).   Medical History: Past Medical History  Diagnosis Date  . Hypertension   . Coronary artery disease      status post coronary bypass grafting, status post catheterization  July 2011 with 3 out of 3 grafts patent., status post bypass in 2000.   . Diabetes mellitus   . Other specified forms of chronic ischemic heart disease   . Syncope and collapse   . Automatic implantable cardiac defibrillator in situ     QRS duration 120 ms no typical left bundle branch block.  . Atrial fibrillation     Taking sotalol, intolerant to amiodarone  . Valvular heart disease     Mild to moderate mitral valve insufficiency and mild aortic insufficiency  . Chronic airway obstruction, not elsewhere classified   . Old myocardial infarction   . Other and unspecified hyperlipidemia   . Congestive heart failure, unspecified     Ischemic cardiomyopathy ejection fraction 30-35%  . Cerebrovascular disease, unspecified   . Other mechanical complication of other internal orthopedic device, implant, and graft   . Carotid artery disease     status post left CEA  . Head and neck cancer     Receiving radiation therapy.  . Orthostatic hypotension     Medications:  Prescriptions prior to admission    Medication Sig Dispense Refill  . albuterol (PROAIR HFA) 108 (90 BASE) MCG/ACT inhaler Inhale 2 puffs into the lungs every 6 (six) hours as needed. For shortness of breath      . albuterol (PROVENTIL) (2.5 MG/3ML) 0.083% nebulizer solution Take 2.5 mg by nebulization every 6 (six) hours as needed.      [name] ALDACTONE 25 MG tablet TAKE 1 TABLET ONCE DAILY.  30 each  6  . aspirin EC 81 MG tablet Take 1 tablet (81 mg total) by mouth daily.      [name] BETAPACE 160 MG tablet TAKE (1/2) TABLET BY MOUTH TWICE DAILY.  15 each  6  . famotidine (PEPCID) 20 MG tablet Take 40 mg by mouth every evening.      [name] FLONASE 50 MCG/ACT nasal spray USE (2) SPRAYS IN EACH NOSTRIL ONCE DAILY.  16 g  2  . furosemide (LASIX) 40 MG tablet Take 60 mg by mouth daily.      [name] loratadine (CLARITIN) 10 MG tablet Take 10 mg by mouth daily. For allergies      . nitroGLYCERIN (NITROSTAT) 0.4 MG SL tablet Place 1 tablet (0.4 mg total) under the tongue every 5 (five) minutes as needed for chest pain.  25 each  3  . predniSONE (DELTASONE) 5 MG tablet Take 5 mg by mouth every other day.      . simvastatin (  ZOCOR) 40 MG tablet Take 40 mg by mouth at bedtime.        . valsartan (DIOVAN) 80 MG tablet Take 40 mg by mouth daily.        [name] warfarin (COUMADIN) 5 MG tablet Take 0.5-5 mg by mouth daily. Mondays and fridays take 5mg  and rest of days take half of the 5mg  tablet.      [name] DISCONTD: warfarin (COUMADIN) 5 MG tablet Take 1 tablet (5 mg total) by mouth daily.  30 tablet  3    Assessment: 78 yo male with dm2 copd pvd paf s/p cabg '07 ef 30-35% presented to [facility] with slow VT and was converted. Heparin to begin with no bolus when inr <2 Goal of Therapy:  Heparin level 0.3-0.7 units/ml Monitor platelets by anticoagulation protocol: Yes   Plan:  F/u inr and begin Heparin at 900 units/hr when <2.0   Janice Coffin 03/06/2012,1:15 AM

## 2012-03-06 NOTE — Progress Notes (Signed)
ANTICOAGULATION CONSULT NOTE - Follow Up Consult  Pharmacy Consult for heparin  Indication: chest pain/ACS/ventricular tachycardia/PAF  Patient Measurements: Height: 5\' 9"  (175.3 cm) Weight: 167 lb 5.3 oz (75.9 kg) IBW/kg (Calculated) : 70.7  Heparin Dosing Weight: 74kg  Vital Signs: Temp: 97.6 F (36.4 C) (08/01 1100) Temp src: Oral (08/01 1100) BP: 103/63 mmHg (08/01 1100) Pulse Rate: 77  (08/01 1100)  Labs:  Basename 03/06/12 1110 03/06/12 0250 03/05/12 2236 03/05/12 2235  HGB -- -- 12.7* --  HCT -- -- 37.8* --  PLT -- -- 208 --  APTT -- -- 36 --  LABPROT 21.7* -- 24.0* --  INR 1.85* -- 2.11* --  HEPARINUNFRC -- -- -- --  CREATININE -- -- 1.07 --  CKTOTAL -- 321* -- 351*  CKMB -- 38.3* -- 40.8*  TROPONINI -- 8.81* -- 11.13*    Estimated Creatinine Clearance: 55.1 ml/min (by C-G formula based on Cr of 1.07).   Medications:  Prescriptions prior to admission  Medication Sig Dispense Refill  . albuterol (PROAIR HFA) 108 (90 BASE) MCG/ACT inhaler Inhale 2 puffs into the lungs every 6 (six) hours as needed. For shortness of breath      . albuterol (PROVENTIL) (2.5 MG/3ML) 0.083% nebulizer solution Take 2.5 mg by nebulization every 6 (six) hours as needed.      [name] ALDACTONE 25 MG tablet TAKE 1 TABLET ONCE DAILY.  30 each  6  . aspirin EC 81 MG tablet Take 1 tablet (81 mg total) by mouth daily.      [name] BETAPACE 160 MG tablet TAKE (1/2) TABLET BY MOUTH TWICE DAILY.  15 each  6  . famotidine (PEPCID) 20 MG tablet Take 40 mg by mouth every evening.      [name] FLONASE 50 MCG/ACT nasal spray USE (2) SPRAYS IN EACH NOSTRIL ONCE DAILY.  16 g  2  . furosemide (LASIX) 40 MG tablet Take 60 mg by mouth daily.      [name] loratadine (CLARITIN) 10 MG tablet Take 10 mg by mouth daily. For allergies      . nitroGLYCERIN (NITROSTAT) 0.4 MG SL tablet Place 1 tablet (0.4 mg total) under the tongue every 5 (five) minutes as needed for chest pain.  25 each  3  . predniSONE (DELTASONE) 5 MG tablet Take  5 mg by mouth every other day.      . simvastatin (ZOCOR) 40 MG tablet Take 40 mg by mouth at bedtime.        . valsartan (DIOVAN) 80 MG tablet Take 40 mg by mouth daily.        [name] warfarin (COUMADIN) 5 MG tablet Take 0.5-5 mg by mouth daily. Mondays and fridays take 5mg  and rest of days take half of the 5mg  tablet.      [name] DISCONTD: warfarin (COUMADIN) 5 MG tablet Take 1 tablet (5 mg total) by mouth daily.  30 tablet  3    Assessment: 78 year old male transferred from [facility] for [facility] evaluation.  Patient was on coumadin prior to admit, his INR is now below 2 will start IV heparin for anticoagulation. Noted plans to likely take to cath today/tomorrow.  Goal of Therapy:  Heparin level 0.3-0.7 units/ml Monitor platelets by anticoagulation protocol: Yes   Plan:  Start heparin at 900 units/hr Check HL in 8 hours then daily Daily CBC Follow up plans for cath  [name] 03/06/2012,1:18 PM

## 2012-03-07 ENCOUNTER — Encounter (HOSPITAL_COMMUNITY)
Admission: AD | Disposition: A | Discharge status: Home / Self Care | Payer: Self-pay | Source: Other Acute Inpatient Hospital | Attending: Cardiology

## 2012-03-07 ENCOUNTER — Encounter (HOSPITAL_COMMUNITY): Payer: Self-pay | Admitting: Cardiology

## 2012-03-07 DIAGNOSIS — I2510 Atherosclerotic heart disease of native coronary artery without angina pectoris: Secondary | ICD-10-CM

## 2012-03-07 HISTORY — PX: LEFT HEART CATHETERIZATION WITH CORONARY ANGIOGRAM: SHX5451

## 2012-03-07 LAB — CBC
HCT: 34.5 % — ABNORMAL LOW (ref 39.0–52.0)
Hemoglobin: 11.7 g/dL — ABNORMAL LOW (ref 13.0–17.0)
MCHC: 33.9 g/dL (ref 30.0–36.0)

## 2012-03-07 LAB — PROTIME-INR
INR: 1.7 — ABNORMAL HIGH (ref 0.00–1.49)
Prothrombin Time: 22 seconds — ABNORMAL HIGH (ref 11.6–15.2)

## 2012-03-07 SURGERY — LEFT HEART CATHETERIZATION WITH CORONARY ANGIOGRAM
Anesthesia: LOCAL

## 2012-03-07 MED ORDER — LIDOCAINE HCL (PF) 1 % IJ SOLN
INTRAMUSCULAR | Status: AC
  Filled 2012-03-07: qty 30

## 2012-03-07 MED ORDER — NITROGLYCERIN 0.2 MG/ML ON CALL CATH LAB
INTRAVENOUS | Status: AC
  Filled 2012-03-07: qty 1

## 2012-03-07 MED ORDER — BUDESONIDE 0.5 MG/2ML IN SUSP
0.5000 mg | Freq: Two times a day (BID) | RESPIRATORY_TRACT | Status: DC
  Administered 2012-03-07 – 2012-03-09 (×4): 0.5 mg via RESPIRATORY_TRACT
  Filled 2012-03-07 (×8): qty 2

## 2012-03-07 MED ORDER — HEPARIN (PORCINE) IN NACL 2-0.9 UNIT/ML-% IJ SOLN
INTRAMUSCULAR | Status: AC
  Filled 2012-03-07: qty 2000

## 2012-03-07 MED ORDER — SODIUM CHLORIDE 0.9 % IV SOLN: INTRAVENOUS | Status: AC

## 2012-03-07 MED ORDER — ARFORMOTEROL TARTRATE 15 MCG/2ML IN NEBU
15.0000 ug | INHALATION_SOLUTION | Freq: Two times a day (BID) | RESPIRATORY_TRACT | Status: DC
  Administered 2012-03-07 – 2012-03-08 (×3): 15 ug via RESPIRATORY_TRACT
  Filled 2012-03-07 (×7): qty 2

## 2012-03-07 MED ORDER — BROMFENAC SODIUM 0.07 % OP SOLN: 1.0000 | Freq: Two times a day (BID) | OPHTHALMIC | Status: DC

## 2012-03-07 MED ORDER — MIDAZOLAM HCL 2 MG/2ML IJ SOLN
INTRAMUSCULAR | Status: AC
  Filled 2012-03-07: qty 2

## 2012-03-07 MED ORDER — FENTANYL CITRATE 0.05 MG/ML IJ SOLN
INTRAMUSCULAR | Status: AC
  Filled 2012-03-07: qty 2

## 2012-03-07 NOTE — Progress Notes (Signed)
ANTICOAGULATION CONSULT NOTE - Follow Up Consult  Pharmacy Consult for heparin Indication: CP/ACS/Vtach/PAF  Labs:  Basename 03/07/12 0645 03/06/12 2144 03/06/12 1110 03/06/12 0250 03/05/12 2236 03/05/12 2235  HGB 11.7* -- -- -- 12.7* --  HCT 34.5* -- -- -- 37.8* --  PLT 168 -- -- -- 208 --  APTT -- -- -- -- 36 --  LABPROT 22.0* -- 21.7* -- 24.0* --  INR 1.89* -- 1.85* -- 2.11* --  HEPARINUNFRC 0.43 0.14* -- -- -- --  CREATININE -- -- -- -- 1.07 --  CKTOTAL -- -- -- 321* -- 351*  CKMB -- -- -- 38.3* -- 40.8*  TROPONINI -- -- -- 8.81* -- 11.13*    Assessment: 78yo male at goal (HL=0.43) on heparin for NSTEMI (also noted on coumadin PTA for afib) with plans for cath today.  Goal of Therapy:  Heparin level 0.3-0.7 units/ml   Plan:  -No heparin changes needed -Will follow plans post cath  Harland German, Pharm D 03/07/2012 8:39 AM

## 2012-03-07 NOTE — Progress Notes (Signed)
To the cath lab by bed, stable, . 

## 2012-03-07 NOTE — Progress Notes (Signed)
Patient Name: [name] Date of Encounter: 03/07/2012  Active Problems:  HYPERTENSION  ISCHEMIC CARDIOMYOPATHY  VENTRICULAR TACHYCARDIA  IMPLANTATION OF DEFIBRILLATOR, HX OF  Atrial fibrillation  Chest pain  NSTEMI (non-ST elevated myocardial infarction)    SUBJECTIVE: No palps, no chest pain, breathing slightly worse but he is not on home nebs.   OBJECTIVE Filed Vitals:   03/07/12 0500 03/07/12 0556 03/07/12 0800 03/07/12 0950  BP: 104/51  100/54 93/56  Pulse: 79  79   Temp:   97.7 F (36.5 C)   TempSrc:   Oral   Resp: 18  14   Height:      Weight:  166 lb 3.6 oz (75.4 kg)    SpO2: 100%  100%     Intake/Output Summary (Last 24 hours) at 03/07/12 1012 Last data filed at 03/07/12 0800  Gross per 24 hour  Intake  687.8 ml  Output   2250 ml  Net -1562.2 ml   Weight change: -14.1 oz (-0.4 kg) Filed Weights   03/05/12 2045 03/06/12 0500 03/07/12 0556  Weight: 167 lb 1.7 oz (75.8 kg) 167 lb 5.3 oz (75.9 kg) 166 lb 3.6 oz (75.4 kg)   PHYSICAL EXAM General: Well developed, well nourished, male in no acute distress. Head: Normocephalic, atraumatic.  Neck: Supple without bruits, JVD 8-10 cm. Lungs:  Resp regular and unlabored, decreased BS bases, no crackles or wheeze. Heart: RRR, S1, S2, no S3, S4, or murmur. Abdomen: Soft, non-tender, non-distended, BS + x 4.  Extremities: No clubbing, cyanosis, no edema.  Neuro: Alert and oriented X 3. Moves all extremities spontaneously. Psych: Normal affect.  LABS: CBC: Basename 03/07/12 0645 03/05/12 2236  WBC 5.6 7.4  NEUTROABS -- 5.9  HGB 11.7* 12.7*  HCT 34.5* 37.8*  MCV 87.1 89.2  PLT 168 208   INR: Basename 03/07/12 0645  INR 1.89*   Basic Metabolic Panel: [facility] 03/05/12 2236  NA 140  K 3.9  CL 101  CO2 26  GLUCOSE 90  BUN 20  CREATININE 1.07  CALCIUM 9.4  MG 2.4  PHOS --   Liver Function Tests: [facility] 03/05/12 2236  AST 48*  ALT 16  ALKPHOS 46  BILITOT 0.5  PROT 6.9  ALBUMIN 3.9    Cardiac Enzymes: Basename 03/06/12 0250 03/05/12 2235  CKTOTAL 321* 351*  CKMB 38.3* 40.8*  CKMBINDEX -- --  TROPONINI 8.81* 11.13*   BNP: Pro B Natriuretic peptide (BNP)  Date/Time Value Range Status  03/05/2012 10:35 PM 4955.0* 0 - 450 pg/mL Final   Hemoglobin A1C: Basename 03/05/12 2236  HGBA1C 6.1*   Fasting Lipid Panel: Basename 03/06/12 0050  CHOL 134  HDL 32*  LDLCALC 67  TRIG 191*  CHOLHDL 4.2  LDLDIRECT --   Thyroid Function Tests: Basename 03/05/12 2236  TSH 5.995*  T4TOTAL --  T3FREE --  THYROIDAB --   TELE:  Pacing at times, MD review printouts, ?oversensing.     Scheduled Meds:  . aspirin EC  81 mg Oral Daily  . Bromfenac Sodium  1 Inhaler Nebulization BID  . clopidogrel  75 mg Oral Q breakfast  . famotidine  40 mg Oral QPM  . fluticasone  1 spray Each Nare Daily  . furosemide  60 mg Oral Daily  . insulin aspart  0-9 Units Subcutaneous TID WC  . irbesartan  75 mg Oral Daily  . loratadine  10 mg Oral Daily  . metoprolol tartrate  12.5 mg Oral BID  . predniSONE  5 mg  Oral QODAY  . simvastatin  40 mg Oral QHS  . sodium chloride  3 mL Intravenous Q12H  . sotalol  120 mg Oral Q12H  . spironolactone  25 mg Oral Daily   Continuous Infusions:   . sodium chloride 1 mL/kg/hr (03/07/12 0800)  . heparin 1,100 Units/hr (03/07/12 0700)   PRN Meds:.sodium chloride, acetaminophen, albuterol, albuterol, nitroGLYCERIN, ondansetron (ZOFRAN) IV, sodium chloride    . sodium chloride 1 mL/kg/hr (03/07/12 0800)  . heparin 1,100 Units/hr (03/07/12 0700)    ASSESSMENT AND PLAN: Active Problems:  VENTRICULAR TACHYCARDIA - Sotalol dose changed, give unless QTc is > .510.    NSTEMI (non-ST elevated myocardial infarction) - ?secondary to VT, cath when able, ?today. On ASA/BB/statin   ISCHEMIC CARDIOMYOPATHY - No ACE secondary to hypotension   HYPERTENSION - BP low on current Rx but pt tolerates, no changes for now.   IMPLANTATION OF DEFIBRILLATOR, HX OF -  per EP   Atrial fibrillation - Heart rate generally OK, sometimes > 100 but paced, underlying rhythm unclear.   Chest pain - resolved.   SignedTheodore Demark , PA-C 10:12 AM 03/07/2012  Cath scheduled by Dr. Gala Romney and also Dr. Ladona Ridgel.  Will attempt to do from left radial.  Repeat INR 1.7 earlier  today.  Patient is agreeable to proceed.

## 2012-03-07 NOTE — H&P (View-Only) (Signed)
 Patient Name: [name] Date of Encounter: 03/07/2012  Active Problems:  HYPERTENSION  ISCHEMIC CARDIOMYOPATHY  VENTRICULAR TACHYCARDIA  IMPLANTATION OF DEFIBRILLATOR, HX OF  Atrial fibrillation  Chest pain  NSTEMI (non-ST elevated myocardial infarction)    SUBJECTIVE: No palps, no chest pain, breathing slightly worse but he is not on home nebs.   OBJECTIVE Filed Vitals:   03/07/12 0500 03/07/12 0556 03/07/12 0800 03/07/12 0950  BP: 104/51  100/54 93/56  Pulse: 79  79   Temp:   97.7 F (36.5 C)   TempSrc:   Oral   Resp: 18  14   Height:      Weight:  166 lb 3.6 oz (75.4 kg)    SpO2: 100%  100%     Intake/Output Summary (Last 24 hours) at 03/07/12 1012 Last data filed at 03/07/12 0800  Gross per 24 hour  Intake  687.8 ml  Output   2250 ml  Net -1562.2 ml   Weight change: -14.1 oz (-0.4 kg) Filed Weights   03/05/12 2045 03/06/12 0500 03/07/12 0556  Weight: 167 lb 1.7 oz (75.8 kg) 167 lb 5.3 oz (75.9 kg) 166 lb 3.6 oz (75.4 kg)   PHYSICAL EXAM General: Well developed, well nourished, male in no acute distress. Head: Normocephalic, atraumatic.  Neck: Supple without bruits, JVD 8-10 cm. Lungs:  Resp regular and unlabored, decreased BS bases, no crackles or wheeze. Heart: RRR, S1, S2, no S3, S4, or murmur. Abdomen: Soft, non-tender, non-distended, BS + x 4.  Extremities: No clubbing, cyanosis, no edema.  Neuro: Alert and oriented X 3. Moves all extremities spontaneously. Psych: Normal affect.  LABS: CBC: Basename 03/07/12 0645 03/05/12 2236  WBC 5.6 7.4  NEUTROABS -- 5.9  HGB 11.7* 12.7*  HCT 34.5* 37.8*  MCV 87.1 89.2  PLT 168 208   INR: Basename 03/07/12 0645  INR 1.89*   Basic Metabolic Panel: Basename 03/05/12 2236  NA 140  K 3.9  CL 101  CO2 26  GLUCOSE 90  BUN 20  CREATININE 1.07  CALCIUM 9.4  MG 2.4  PHOS --   Liver Function Tests: Basename 03/05/12 2236  AST 48*  ALT 16  ALKPHOS 46  BILITOT 0.5  PROT 6.9  ALBUMIN 3.9    Cardiac Enzymes: Basename 03/06/12 0250 03/05/12 2235  CKTOTAL 321* 351*  CKMB 38.3* 40.8*  CKMBINDEX -- --  TROPONINI 8.81* 11.13*   BNP: Pro B Natriuretic peptide (BNP)  Date/Time Value Range Status  03/05/2012 10:35 PM 4955.0* 0 - 450 pg/mL Final   Hemoglobin A1C: Basename 03/05/12 2236  HGBA1C 6.1*   Fasting Lipid Panel: Basename 03/06/12 0050  CHOL 134  HDL 32*  LDLCALC 67  TRIG 176*  CHOLHDL 4.2  LDLDIRECT --   Thyroid Function Tests: Basename 03/05/12 2236  TSH 5.995*  T4TOTAL --  T3FREE --  THYROIDAB --   TELE:  Pacing at times, MD review printouts, ?oversensing.     Scheduled Meds:  . aspirin EC  81 mg Oral Daily  . Bromfenac Sodium  1 Inhaler Nebulization BID  . clopidogrel  75 mg Oral Q breakfast  . famotidine  40 mg Oral QPM  . fluticasone  1 spray Each Nare Daily  . furosemide  60 mg Oral Daily  . insulin aspart  0-9 Units Subcutaneous TID WC  . irbesartan  75 mg Oral Daily  . loratadine  10 mg Oral Daily  . metoprolol tartrate  12.5 mg Oral BID  . predniSONE  5 mg   Oral QODAY  . simvastatin  40 mg Oral QHS  . sodium chloride  3 mL Intravenous Q12H  . sotalol  120 mg Oral Q12H  . spironolactone  25 mg Oral Daily   Continuous Infusions:   . sodium chloride 1 mL/kg/hr (03/07/12 0800)  . heparin 1,100 Units/hr (03/07/12 0700)   PRN Meds:.sodium chloride, acetaminophen, albuterol, albuterol, nitroGLYCERIN, ondansetron (ZOFRAN) IV, sodium chloride    . sodium chloride 1 mL/kg/hr (03/07/12 0800)  . heparin 1,100 Units/hr (03/07/12 0700)    ASSESSMENT AND PLAN: Active Problems:  VENTRICULAR TACHYCARDIA - Sotalol dose changed, give unless QTc is > .510.    NSTEMI (non-ST elevated myocardial infarction) - ?secondary to VT, cath when able, ?today. On ASA/BB/statin   ISCHEMIC CARDIOMYOPATHY - No ACE secondary to hypotension   HYPERTENSION - BP low on current Rx but pt tolerates, no changes for now.   IMPLANTATION OF DEFIBRILLATOR, HX OF -  per EP   Atrial fibrillation - Heart rate generally OK, sometimes > 100 but paced, underlying rhythm unclear.   Chest pain - resolved.   Signed, Rhonda Barrett , PA-C 10:12 AM 03/07/2012  Cath scheduled by Dr. Bensimhon and also Dr. Taylor.  Will attempt to do from left radial.  Repeat INR 1.7 earlier  today.  Patient is agreeable to proceed.    

## 2012-03-07 NOTE — Progress Notes (Signed)
Back from the cath. lab, stable, bedrest emphasized, right groin angioseal intact.

## 2012-03-07 NOTE — Progress Notes (Signed)
Films reviewed and compared to old films.  Not sure that much has changed.  All of his grafts are patent, with mild insertion narrowing  at the distal end of the RCA graft that does not appear severe, nor appears changed..  EF appears down from before.  See EP plan.

## 2012-03-07 NOTE — Interval H&P Note (Signed)
History and Physical Interval Note:  03/07/2012 5:06 PM  [name]  has presented today for surgery, with the diagnosis of Chest pain  The various methods of treatment have been discussed with the patient and family. After consideration of risks, benefits and other options for treatment, the patient has consented to  Procedure(s) (LRB): LEFT HEART CATHETERIZATION WITH CORONARY ANGIOGRAM (N/A) as a surgical intervention .  The patient's history has been reviewed, patient examined, no change in status, stable for surgery.  I have reviewed the patient's chart and labs.  Questions were answered to the patient's satisfaction.     Shawnie Pons

## 2012-03-07 NOTE — CV Procedure (Addendum)
   Cardiac Catheterization Procedure Note  Name: [name] MRN: [id number] DOB: [birth year]  Procedure: Left Heart Cath, Selective Coronary Angiography,  SVG and LIMA angiography,  LV angiography  Indication: Positive troponins with chest pain.  VT      Procedural details: The right groin was prepped, draped, and anesthetized with 1% lidocaine. Using modified Seldinger technique, a 4 French sheath was introduced into the right femoral artery. Standard Judkins catheters were used for coronary angiography, SVG, LIMA,  and left ventriculography. Catheter exchanges were performed over a guidewire. There were no immediate procedural complications.  The femoral area was then prepped and the gloves changed.  A 5/53F angioseal was then placed without difficulty.  The patient was transferred to the post catheterization recovery area for further monitoring.  Procedural Findings: Hemodynamics:  AO 120/46 (73) LV 134/28 No gradient on pullback.     Coronary angiography: Coronary dominance: right  Left mainstem: The LMCA is calcified and demonstrates on ostial stenosis of 80%.  There is good flow into the ramus, with no obstructive calcified plaque.    Left anterior descending (LAD): Demonstrates probably a significant lesion just beyond the diagonal bifurcation.    The SVG to the OM is widely patent with retrograde filling back to the LMCA  The LIMA to the LAD is widely patent with about 30% ostial narrowing.  The flow distally is competitive.  Left circumflex (LCx): diffusely plaque with distal competitive filling  Right coronary artery (RCA): Diffusely ectatic proximally then total occlusion beyond a marginal branch.    The SVG to PDA is patent.  There is tapered narrowing of less than 40% at its insertion to the PDA.  There is brisk filling of the PDA retrograde into the PLA.    Left ventriculography: EF is reduced with global hypokinesis, and EF approximately 15%.    Final  Conclusions:    1.  Ischemic CM with severe global hypokinesis  2.  Patent IMA to the LAD. 3.  Patent SVG to the OM 4.  Patent SVG to PDA with some mild insertion narrowing.     Recommendations:  1.  Compare with prior films to review any changes.     2.  Medical therapy. 3.  Treatment of VT.    Shawnie Pons 03/07/2012, 6:21 PM

## 2012-03-07 NOTE — Progress Notes (Signed)
Pts. BP was low, remain asymptomatic SBP in the 80's automatic and manually, both arms, one reading manually was 90/50 lt. arm. Dr. Shirlee Latch made aware and with order to hold metoprolol and pt. can have a dose of sotalol. Will cont. to monitor.

## 2012-03-08 LAB — CBC
HCT: 33.2 % — ABNORMAL LOW (ref 39.0–52.0)
MCV: 87.4 fL (ref 78.0–100.0)
RBC: 3.8 MIL/uL — ABNORMAL LOW (ref 4.22–5.81)
RDW: 14.4 % (ref 11.5–15.5)
WBC: 4.8 10*3/uL (ref 4.0–10.5)

## 2012-03-08 LAB — GLUCOSE, CAPILLARY
Glucose-Capillary: 108 mg/dL — ABNORMAL HIGH (ref 70–99)
Glucose-Capillary: 87 mg/dL (ref 70–99)
Glucose-Capillary: 90 mg/dL (ref 70–99)

## 2012-03-08 MED ORDER — WARFARIN - PHARMACIST DOSING INPATIENT: Freq: Every day | Status: DC

## 2012-03-08 MED ORDER — WARFARIN SODIUM 7.5 MG PO TABS
7.5000 mg | ORAL_TABLET | Freq: Once | ORAL | Status: AC
  Administered 2012-03-08: 7.5 mg via ORAL
  Filled 2012-03-08: qty 1

## 2012-03-08 NOTE — Progress Notes (Signed)
ANTICOAGULATION CONSULT NOTE - Initial Consult  Pharmacy Consult for Coumadin Indication: atrial fibrillation  No Known Allergies  Patient Measurements: Height: 5\' 9"  (175.3 cm) Weight: 167 lb 1.7 oz (75.8 kg) IBW/kg (Calculated) : 70.7   Vital Signs: Temp: 97.5 F (36.4 C) (08/03 1656) Temp src: Oral (08/03 1656) BP: 120/80 mmHg (08/03 1656) Pulse Rate: 78  (08/03 1656)  Labs:  Basename 03/08/12 0520 03/07/12 1422 03/07/12 0645 03/06/12 2144 03/06/12 1110 03/06/12 0250 03/05/12 2236 03/05/12 2235  HGB 11.3* -- 11.7* -- -- -- -- --  HCT 33.2* -- 34.5* -- -- -- 37.8* --  PLT 168 -- 168 -- -- -- 208 --  APTT -- -- -- -- -- -- 36 --  LABPROT -- 20.3* 22.0* -- 21.7* -- -- --  INR -- 1.70* 1.89* -- 1.85* -- -- --  HEPARINUNFRC -- -- 0.43 0.14* -- -- -- --  CREATININE -- -- -- -- -- -- 1.07 --  CKTOTAL -- -- -- -- -- 321* -- 351*  CKMB -- -- -- -- -- 38.3* -- 40.8*  TROPONINI -- -- -- -- -- 8.81* -- 11.13*    Estimated Creatinine Clearance: 55.1 ml/min (by C-G formula based on Cr of 1.07).   Medical History: Past Medical History  Diagnosis Date  . Hypertension   . Coronary artery disease      status post coronary bypass grafting, status post catheterization  July 2011 with 3 out of 3 grafts patent., status post bypass in 2000.   . Diabetes mellitus   . Other specified forms of chronic ischemic heart disease   . Syncope and collapse   . Automatic implantable cardiac defibrillator in situ     QRS duration 120 ms no typical left bundle branch block.  . Atrial fibrillation     Taking sotalol, intolerant to amiodarone  . Valvular heart disease     Mild to moderate mitral valve insufficiency and mild aortic insufficiency  . Chronic airway obstruction, not elsewhere classified   . Old myocardial infarction   . Other and unspecified hyperlipidemia   . Congestive heart failure, unspecified     Ischemic cardiomyopathy ejection fraction 30-35%  . Cerebrovascular disease,  unspecified   . Other mechanical complication of other internal orthopedic device, implant, and graft   . Carotid artery disease     status post left CEA  . Head and neck cancer     Receiving radiation therapy.  . Orthostatic hypotension     Medications:  Prescriptions prior to admission  Medication Sig Dispense Refill  . albuterol (PROAIR HFA) 108 (90 BASE) MCG/ACT inhaler Inhale 2 puffs into the lungs every 6 (six) hours as needed. For shortness of breath      . albuterol (PROVENTIL) (2.5 MG/3ML) 0.083% nebulizer solution Take 2.5 mg by nebulization every 6 (six) hours as needed.      [name] ALDACTONE 25 MG tablet TAKE 1 TABLET ONCE DAILY.  30 each  6  . aspirin EC 81 MG tablet Take 1 tablet (81 mg total) by mouth daily.      [name] BETAPACE 160 MG tablet TAKE (1/2) TABLET BY MOUTH TWICE DAILY.  15 each  6  . budesonide (PULMICORT) 0.5 MG/2ML nebulizer solution Take 0.5 mg by nebulization 2 (two) times daily.      . famotidine (PEPCID) 20 MG tablet Take 40 mg by mouth every evening.      [name] FLONASE 50 MCG/ACT nasal spray USE (2) SPRAYS IN EACH NOSTRIL ONCE DAILY.  16 g  2  . formoterol (PERFOROMIST) 20 MCG/2ML nebulizer solution Take 20 mcg by nebulization 2 (two) times daily.      . furosemide (LASIX) 40 MG tablet Take 60 mg by mouth daily.      [name] loratadine (CLARITIN) 10 MG tablet Take 10 mg by mouth daily. For allergies      . nitroGLYCERIN (NITROSTAT) 0.4 MG SL tablet Place 1 tablet (0.4 mg total) under the tongue every 5 (five) minutes as needed for chest pain.  25 each  3  . predniSONE (DELTASONE) 5 MG tablet Take 5 mg by mouth every other day.      . simvastatin (ZOCOR) 40 MG tablet Take 40 mg by mouth at bedtime.        . valsartan (DIOVAN) 80 MG tablet Take 40 mg by mouth daily.        [name] warfarin (COUMADIN) 5 MG tablet Take 0.5-5 mg by mouth daily. Mondays and fridays take 5mg  and rest of days take half of the 5mg  tablet.      [name] DISCONTD: warfarin (COUMADIN) 5 MG tablet Take 1 tablet (5  mg total) by mouth daily.  30 tablet  3  . megestrol (MEGACE) 40 MG/ML suspension as needed.        Assessment: 78 y.o. male on coumadin PTA for afib. s/p cath 8/2 which showed patent grafts. INR 1.7 yesterday. Last Coumadin dose 7/30. CBC stable.  Goal of Therapy:  INR 2-3 Monitor platelets by anticoagulation protocol: Yes   Plan:  1. Daily PT/INR 2. Coumadin 7.5 mg po today  Christoper Fabian, PharmD, BCPS Clinical pharmacist, pager [phone] 03/08/2012,5:07 PM

## 2012-03-08 NOTE — Progress Notes (Signed)
Subjective:  No c/o SOB,no chest pain.  Objective:  Vital Signs in the last 24 hours: BP 97/52  Pulse 80  Temp 97.5 F (36.4 C) (Oral)  Resp 17  Ht 5\' 9"  (1.753 m)  Wt 75.8 kg (167 lb 1.7 oz)  BMI 24.68 kg/m2  SpO2 97%  Physical Exam: Pleasant WM  In NAD Lungs:  Clear Cardiac:  Regular rhythm, normal S1 and S2, no S3 Abdomen:  Soft, nontender, no masses Extremities:  Cath site clean and dry  Intake/Output from previous day: 08/02 0701 - 08/03 0700 In: 1935.2 [P.O.:740; I.V.:1195.2] Out: 1900 [Urine:1900] Weight Filed Weights   03/06/12 0500 03/07/12 0556 03/08/12 0700  Weight: 75.9 kg (167 lb 5.3 oz) 75.4 kg (166 lb 3.6 oz) 75.8 kg (167 lb 1.7 oz)    Lab Results: Basic Metabolic Panel:  [facility] 03/05/12 2236  NA 140  K 3.9  CL 101  CO2 26  GLUCOSE 90  BUN 20  CREATININE 1.07    CBC:  Basename 03/08/12 0520 03/07/12 0645 03/05/12 2236  WBC 4.8 5.6 --  NEUTROABS -- -- 5.9  HGB 11.3* 11.7* --  HCT 33.2* 34.5* --  MCV 87.4 87.1 --  PLT 168 168 --   BNP    Component Value Date/Time   PROBNP 4955.0* 03/05/2012 2235    PROTIME: Lab Results  Component Value Date   INR 1.70* 03/07/2012   INR 1.89* 03/07/2012   INR 1.85* 03/06/2012   Telemetry:  Paced ryhthm, no VT.  Occ PVC's  Assessment/Plan:  1. VT- pt with symptomatic sustained VT at 156 bpm. Sotalol was increased and defib settings adjusted this admission 2. NSTEMI- likely demand ischemic due to VT. Cath showed patent grafts 3. Afib- coumadin to be restarted 4. Ischemic CM- continue current medical therapy   Plan:  Move to floor and ambulate. Restart warfarin. D/c Plavix.         Darden Palmer  MD [facility]  03/08/2012, 12:21 PM

## 2012-03-08 NOTE — Progress Notes (Signed)
Pt. Received to rm. [id number].  Transferred via bed with two nurse escorts.  Pt. And wife oriented to room and unit routine.

## 2012-03-09 ENCOUNTER — Encounter (HOSPITAL_COMMUNITY): Payer: Self-pay | Admitting: Physician Assistant

## 2012-03-09 LAB — PROTIME-INR
INR: 1.26 (ref 0.00–1.49)
Prothrombin Time: 16.1 seconds — ABNORMAL HIGH (ref 11.6–15.2)

## 2012-03-09 LAB — GLUCOSE, CAPILLARY
Glucose-Capillary: 97 mg/dL (ref 70–99)
Glucose-Capillary: 108 mg/dL — ABNORMAL HIGH (ref 70–99)

## 2012-03-09 MED ORDER — SOTALOL HCL 120 MG PO TABS: 120.0000 mg | ORAL_TABLET | Freq: Two times a day (BID) | ORAL | Status: DC

## 2012-03-09 MED ORDER — METOPROLOL TARTRATE 12.5 MG HALF TABLET: 12.5000 mg | ORAL_TABLET | Freq: Two times a day (BID) | ORAL | Status: DC

## 2012-03-09 MED ORDER — WARFARIN SODIUM 5 MG PO TABS
5.0000 mg | ORAL_TABLET | Freq: Once | ORAL | Status: DC
  Filled 2012-03-09: qty 1

## 2012-03-09 NOTE — Progress Notes (Signed)
ANTICOAGULATION CONSULT NOTE - Follow Up Consult  Pharmacy Consult for Warfarin Indication: atrial fibrillation  No Known Allergies  Patient Measurements: Height: 5\' 9"  (175.3 cm) Weight: 166 lb 14.2 oz (75.7 kg) IBW/kg (Calculated) : 70.7   Vital Signs: Temp: 98.1 F (36.7 C) (08/04 0500) Temp src: Oral (08/04 0500) BP: 98/60 mmHg (08/04 0500) Pulse Rate: 73  (08/04 0500)  Labs:  Basename 03/09/12 0621 03/08/12 0520 03/07/12 1422 03/07/12 0645 03/06/12 2144  HGB -- 11.3* -- 11.7* --  HCT -- 33.2* -- 34.5* --  PLT -- 168 -- 168 --  APTT -- -- -- -- --  LABPROT 16.1* -- 20.3* 22.0* --  INR 1.26 -- 1.70* 1.89* --  HEPARINUNFRC -- -- -- 0.43 0.14*  CREATININE -- -- -- -- --  CKTOTAL -- -- -- -- --  CKMB -- -- -- -- --  TROPONINI -- -- -- -- --    Estimated Creatinine Clearance: 55.1 ml/min (by C-G formula based on Cr of 1.07).   Medications:  Scheduled:    . arformoterol  15 mcg Nebulization Q12H  . aspirin EC  81 mg Oral Daily  . budesonide  0.5 mg Nebulization BID  . famotidine  40 mg Oral QPM  . fluticasone  1 spray Each Nare Daily  . furosemide  60 mg Oral Daily  . insulin aspart  0-9 Units Subcutaneous TID WC  . irbesartan  75 mg Oral Daily  . loratadine  10 mg Oral Daily  . metoprolol tartrate  12.5 mg Oral BID  . predniSONE  5 mg Oral QODAY  . simvastatin  40 mg Oral QHS  . sotalol  120 mg Oral Q12H  . spironolactone  25 mg Oral Daily  . warfarin  7.5 mg Oral ONCE-1800  . Warfarin - Pharmacist Dosing Inpatient   Does not apply q1800  . DISCONTD: clopidogrel  75 mg Oral Q breakfast    Assessment: Pt is a 82 YOM on warfarin PTA for A-fib. Pt is s/p cath on 8/2 which showed patent grafts. INR today is 1.26 (from 1.7) after 7.5mg  of warfarin on 8/3. H/H 11.3/33.2. No evidence of bleeding reported. Home dose of warfarin is 5mg  on Mon/Fri and 2.5mg  all other days (pt was therapeutic at this dose). Warfarin was held 7/31-8/2. Will trend back toward home  regimen today.  Goal of Therapy:  INR 2-3 Monitor platelets by anticoagulation protocol: Yes   Plan:  1. Warfarin 5mg  x 1 today at 1800 2. Daily PT/INR 3. Monitor renal function 4. Follow clinical progression  Abran Duke, PharmD Clinical Pharmacist Phone: [phone] Pager: [phone] 03/09/2012 10:12 AM

## 2012-03-09 NOTE — Progress Notes (Signed)
Subjective:  No c/o SOB,no chest pain.  Objective:  Vital Signs in the last 24 hours: BP 110/72  Pulse 68  Temp 98.1 F (36.7 C) (Oral)  Resp 18  Ht 5\' 9"  (1.753 m)  Wt 166 lb 14.2 oz (75.7 kg)  BMI 24.65 kg/m2  SpO2 96%  Physical Exam: Pleasant WM  In NAD Neck: supple Lungs:  Clear Cardiac:  Regular rhythm, normal S1 and S2, no S3 Abdomen:  Soft, nontender, no masses Extremities:  No edema  Intake/Output from previous day: 08/03 0701 - 08/04 0700 In: 480 [P.O.:480] Out: 800 [Urine:800] Weight Filed Weights   03/07/12 0556 03/08/12 0700 03/09/12 0500  Weight: 166 lb 3.6 oz (75.4 kg) 167 lb 1.7 oz (75.8 kg) 166 lb 14.2 oz (75.7 kg)     CBC:  Basename 03/08/12 0520 03/07/12 0645  WBC 4.8 5.6  NEUTROABS -- --  HGB 11.3* 11.7*  HCT 33.2* 34.5*  MCV 87.4 87.1  PLT 168 168   BNP    Component Value Date/Time   PROBNP 4955.0* 03/05/2012 2235    PROTIME: Lab Results  Component Value Date   INR 1.26 03/09/2012   INR 1.70* 03/07/2012   INR 1.89* 03/07/2012    Assessment/Plan:  1. VT- pt with symptomatic sustained VT at 156 bpm. Sotalol was increased, metoprolol added and defib settings adjusted this admission 2. NSTEMI- likely demand ischemic due to VT. Cath showed patent grafts 3. Afib- coumadin restarted; DC ASA 4. Ischemic CM- continue current medical therapy   Plan DC today on preadmission meds but increased dose of sotalol; resume coumadin at previous dose and FU INR check in [place] as scheduled; FU Dr Andee Lineman 2-4 weeks >30 min PA and physician time  D2 Olga Millers 11:21 AM

## 2012-03-09 NOTE — Discharge Summary (Signed)
Discharge Summary   Patient ID: [name] MRN: [id number], DOB/AGE: January 05, 1929 78 y.o. Admit date: 03/05/2012 D/C date:     03/09/2012  Primary Cardiologist: Dr. Andee Lineman (EP - [facility])  Primary Discharge Diagnoses:  1. Recurrent sustained VT with hx of prior VT  - sotalol increased, metoprolol added, ICD adjusted 2. NSTEMI likely secondary to demand ischemia from VT, with hx of CAD - cath 03/07/12 - grafts patent -> med rx - h/o CABG 2007 3. Ischemic cardiomyopathy - prior EF 30-35%, down to 15% by cath this admission  Secondary Discharge Diagnoses:  1. DM2 2. COPD 3. PVD s/p L CEA 2007 4. PAF 5. Hx of head and neck cancer 6. H/o orthostatic hypotension  Hospital Course: 78 y/o M with hx of DM2, COPD, PVD, PAF, CAD s/p CABG 2007, VT (maintained on Sotalol) and iCM EF 30-35% transferred from [facility] for further evaluation after presenting with slow VT for which he was cardioverted. His last cath was in 02/2010 after presenting with ICD shock. He has a h/o VT and previously maintained on amiodarone but felt to be intolerant due to nausea, and had subsequently been switched to Sotalol. His last shock was in May. He has felt tired lately but remains active with no CP or significant HF symptoms. On day of admission, while out with his wife, he developed palpitations and presyncope with nausea and CP radiating down his left arm. At the [facility] ER, EKG showed slow VT at 158. He was given given diltiazem 10 mg, amio 150mg  and metoprolol 5mg . Apparently he became hypotensive and then was externally cardioverted to NSR. He was transferred to [facility]. His VT was likely below his ICD detection zone. Due to increasing troponins up to 11.13, Coumadin was held with plans for heparin once INR<2. He appeared well-compensated from a HF standpoint. The IV amiodarone that was started at [facility] was discontinued and his sotalol was continued. Dr. Johney Frame with EP saw the patient in  consultation and recommended an increase in his sotalol. Metoprolol was also added. ICD was adjusted with a VT zone at 150-180 bpm with ATP therapies to be delivered. Cardiac cath was performed 03/07/12 given NSTEMI which demonstrated EF 15%, but patent grafts with patent IMA->LAD, patent SVG to the OM, patent SVG to PDA with some mild insertion narrowing. Dr. Riley Kill recommended continued medical therapy including treatment of VT. He compared films to prior, and did not feel that much had changed. Yesterday, Coumadin was restarted. Plavix was discontinued. ASA was discontinued by Dr. Jens Som given concomitant Coumadin. Today the patient is feeling well. His arrhythmia is stable. Dr. Jens Som has seen and examined him and feels he is stable for discharge.  He will go home on new metoprolol and increased dose of sotalol.   Discharge Vitals: Blood pressure 110/72, pulse 68, temperature 98.1 F (36.7 C), temperature source Oral, resp. rate 18, height 5\' 9"  (1.753 m), weight 166 lb 14.2 oz (75.7 kg), SpO2 92.00%.  Labs: Lab Results  Component Value Date   WBC 4.8 03/08/2012   HGB 11.3* 03/08/2012   HCT 33.2* 03/08/2012   MCV 87.4 03/08/2012   PLT 168 03/08/2012     Lab 03/05/12 2236  NA 140  K 3.9  CL 101  CO2 26  BUN 20  CREATININE 1.07  CALCIUM 9.4  PROT 6.9  BILITOT 0.5  ALKPHOS 46  ALT 16  AST 48*  GLUCOSE 90    Lab Results  Component Value Date  CHOL 134 03/06/2012   HDL 32* 03/06/2012   LDLCALC 67 03/06/2012   TRIG 176* 03/06/2012     Diagnostic Studies/Procedures   1. Cardiac catheterization this admission, please see full report and above for summary.   Discharge Medications   Medication List  As of 03/09/2012  1:26 PM   STOP taking these medications         aspirin EC 81 MG tablet         TAKE these medications         albuterol (2.5 MG/3ML) 0.083% nebulizer solution   Commonly known as: PROVENTIL   Take 2.5 mg by nebulization every 6 (six) hours as needed.      PROAIR  HFA 108 (90 BASE) MCG/ACT inhaler   Generic drug: albuterol   Inhale 2 puffs into the lungs every 6 (six) hours as needed. For shortness of breath      ALDACTONE 25 MG tablet   Generic drug: spironolactone   TAKE 1 TABLET ONCE DAILY.      budesonide 0.5 MG/2ML nebulizer solution   Commonly known as: PULMICORT   Take 0.5 mg by nebulization 2 (two) times daily.      famotidine 20 MG tablet   Commonly known as: PEPCID   Take 40 mg by mouth every evening.      FLONASE 50 MCG/ACT nasal spray   Generic drug: fluticasone   USE (2) SPRAYS IN EACH NOSTRIL ONCE DAILY.      formoterol 20 MCG/2ML nebulizer solution   Commonly known as: PERFOROMIST   Take 20 mcg by nebulization 2 (two) times daily.      furosemide 40 MG tablet   Commonly known as: LASIX   Take 60 mg by mouth daily.      loratadine 10 MG tablet   Commonly known as: CLARITIN   Take 10 mg by mouth daily. For allergies      megestrol 40 MG/ML suspension   Commonly known as: MEGACE   as needed.      metoprolol tartrate 12.5 mg Tabs   Commonly known as: LOPRESSOR   Take 0.5 tablets (12.5 mg total) by mouth 2 (two) times daily.      nitroGLYCERIN 0.4 MG SL tablet   Commonly known as: NITROSTAT   Place 1 tablet (0.4 mg total) under the tongue every 5 (five) minutes as needed for chest pain.      predniSONE 5 MG tablet   Commonly known as: DELTASONE   Take 5 mg by mouth every other day.      simvastatin 40 MG tablet   Commonly known as: ZOCOR   Take 40 mg by mouth at bedtime.      sotalol 120 MG tablet   Commonly known as: BETAPACE   Take 1 tablet (120 mg total) by mouth 2 (two) times daily.      valsartan 80 MG tablet   Commonly known as: DIOVAN   Take 40 mg by mouth daily.      warfarin 5 MG tablet   Commonly known as: COUMADIN   Take 0.5-5 mg by mouth daily. Mondays and fridays take 5mg  and rest of days take half of the 5mg  tablet.            Disposition   The patient will be discharged in stable  condition to home. Discharge Orders    Future Appointments: Provider: Department: Dept Phone: Center:   03/13/2012 9:00 AM [facility] 1 [facility] [place] [phone] [email or web address]  03/21/2012 9:40 AM [facility] Coumadin [facility] [place] [phone] [facility]     Future Orders Please Complete By Expires   Diet - low sodium heart healthy      Increase activity slowly      Comments:   No driving until cleared by your cardiologist. No lifting over 5 lbs for 1 week. No sexual activity for 1 week. Keep procedure site clean & dry. If you notice increased pain, swelling, bleeding or pus, call/return!  You may shower, but no soaking baths/hot tubs/pools for 1 week.     Follow-up Information    Follow up with [facility]. ([facility] 03/21/12 at 9:40am. Our office will call you to schedule your follow-up appointment. Please ask when they call if you still need your defibrillator check on August 8th, as the clinic may decide to cancel this since you were admitted.)    Contact information:   [address] [place] [address] [phone]           Duration of Discharge Encounter: Greater than 30 minutes including physician and PA time.  Signed, Lynnea Vandervoort PA-C 03/09/2012, 1:26 PM

## 2012-03-09 NOTE — Progress Notes (Signed)
Note that the patient's usual pharmacy is closed and he prefers to take his regular rx there. In the meantime he requested an rx of 2 tablets of each of his new medications to get him through the night/AM. I called this in to [facility] in [place] - sotalol 120mg  bid x 2 tabs, metoprolol 25mg  1/2 tab po bid x 2 tabs with 0 refills. Chrishawna Farina PA-C

## 2012-03-10 NOTE — Discharge Summary (Signed)
See progress notes [name]  

## 2012-03-13 ENCOUNTER — Ambulatory Visit (INDEPENDENT_AMBULATORY_CARE_PROVIDER_SITE_OTHER): Payer: Medicare Other | Admitting: *Deleted

## 2012-03-13 ENCOUNTER — Encounter: Payer: Self-pay | Admitting: Internal Medicine

## 2012-03-13 DIAGNOSIS — I472 Ventricular tachycardia: Secondary | ICD-10-CM

## 2012-03-13 DIAGNOSIS — I4891 Unspecified atrial fibrillation: Secondary | ICD-10-CM

## 2012-03-13 LAB — ICD DEVICE OBSERVATION
AL THRESHOLD: 1 V
ATRIAL PACING ICD: 95 pct
BAMS-0001: 150 {beats}/min
BAMS-0003: 70 {beats}/min
CHARGE TIME: 13.4 s
FVT: 0
HV IMPEDENCE: 52 Ohm
RV LEAD AMPLITUDE: 11.7 mv
RV LEAD IMPEDENCE ICD: 737.5 Ohm
TOT-0006: 20081112000000
TOT-0008: 0
TOT-0009: 1
TZAT-0001FASTVT: 1
TZAT-0001SLOWVT: 1
TZAT-0004FASTVT: 8
TZAT-0004SLOWVT: 8
TZAT-0012FASTVT: 200 ms
TZAT-0013FASTVT: 1
TZAT-0019SLOWVT: 7.5 V
TZON-0004FASTVT: 30
TZON-0004SLOWVT: 60
TZON-0005FASTVT: 6
TZON-0010SLOWVT: 80 ms
TZST-0001FASTVT: 3
TZST-0001FASTVT: 5
TZST-0003FASTVT: 25 J
TZST-0003FASTVT: 36 J
TZST-0003FASTVT: 36 J
VENTRICULAR PACING ICD: 10 pct

## 2012-03-13 NOTE — Progress Notes (Signed)
defib check in clinic  

## 2012-03-14 ENCOUNTER — Telehealth: Payer: Self-pay | Admitting: *Deleted

## 2012-03-14 NOTE — Telephone Encounter (Signed)
Notes Recorded by Lesle Chris, LPN on 08/11/1094 at 9:32 AM Patient notified and verbalized understanding.

## 2012-03-14 NOTE — Telephone Encounter (Signed)
Message copied by Lesle Chris on Fri Mar 14, 2012  9:32 AM ------      Message from: Lewayne Bunting E      Created: Wed Mar 12, 2012  6:03 PM       stable

## 2012-03-21 ENCOUNTER — Encounter: Payer: Self-pay | Admitting: Physician Assistant

## 2012-03-21 ENCOUNTER — Other Ambulatory Visit: Payer: Self-pay | Admitting: Physician Assistant

## 2012-03-21 ENCOUNTER — Ambulatory Visit (INDEPENDENT_AMBULATORY_CARE_PROVIDER_SITE_OTHER): Payer: Medicare Other | Admitting: *Deleted

## 2012-03-21 ENCOUNTER — Ambulatory Visit (INDEPENDENT_AMBULATORY_CARE_PROVIDER_SITE_OTHER): Payer: Medicare Other | Admitting: Physician Assistant

## 2012-03-21 VITALS — BP 133/80 | HR 72 | Ht 69.0 in | Wt 165.0 lb

## 2012-03-21 DIAGNOSIS — Z7901 Long term (current) use of anticoagulants: Secondary | ICD-10-CM

## 2012-03-21 DIAGNOSIS — I4891 Unspecified atrial fibrillation: Secondary | ICD-10-CM

## 2012-03-21 DIAGNOSIS — I2589 Other forms of chronic ischemic heart disease: Secondary | ICD-10-CM

## 2012-03-21 DIAGNOSIS — I472 Ventricular tachycardia: Secondary | ICD-10-CM

## 2012-03-21 DIAGNOSIS — R0989 Other specified symptoms and signs involving the circulatory and respiratory systems: Secondary | ICD-10-CM

## 2012-03-21 DIAGNOSIS — I2510 Atherosclerotic heart disease of native coronary artery without angina pectoris: Secondary | ICD-10-CM

## 2012-03-21 MED ORDER — METOPROLOL SUCCINATE ER 25 MG PO TB24: 25.0000 mg | ORAL_TABLET | Freq: Every day | ORAL | Status: DC

## 2012-03-21 NOTE — Assessment & Plan Note (Signed)
Continue current medication regimen, with simplification of Lopressor to Toprol-XL 25 mg daily, for improved compliance. Patient will start after finishing current supply of Lopressor. Will check followup metabolic profile/magnesium level, given that patient is on Aldactone.

## 2012-03-21 NOTE — Patient Instructions (Addendum)
Your physician recommends that you schedule a follow-up appointment in: 2 months with Dr. Andee Lineman. Your physician has recommended you make the following change in your medication: Change lopressor to Toprol XL 25 mg daily after you've completed your current supply of lopressor. Stop Aspirin. Your new prescription has been sent to your pharmacy. All other medications will remain the same. Your physician recommends that you return for lab work in: Today for BMET and Magnesium levels at [facility]. Your physician has requested that you have an ultrasound of your groin today.

## 2012-03-21 NOTE — Assessment & Plan Note (Signed)
Continue current medication regimen, with simplification of Lopressor to Toprol-XL 25 mg daily, for improved compliance. Patient will start after finishing current supply of Lopressor.

## 2012-03-21 NOTE — Assessment & Plan Note (Signed)
Will order right groin ultrasound to rule out PSA or AV fistula

## 2012-03-21 NOTE — Assessment & Plan Note (Signed)
On chronic Coumadin, due for followup INR today. 12-lead EKG today suggests junctional rhythm at 72 bpm. QT interval stable. Patient is not on digoxin.

## 2012-03-21 NOTE — Assessment & Plan Note (Signed)
No interim ICD shocks, per patient. Continue current dose sotalol with recent addition of Lopressor. Followup with Dr. Johney Frame, as scheduled.

## 2012-03-21 NOTE — Progress Notes (Signed)
Primary Cardiologist: Lewayne Bunting, MD   HPI: Presents status post recent hospitalization at [facility], following direct transfer from [facility] ED. Patient presented with VT, with history of such, complicated by hypotension requiring unsynchronized cardioversion, prior to transfer. Of note, he presented with complaint of CP, and ruled in for NST EMI (peak troponin 11). Prior to transfer, he was placed on IV amiodarone and was subsequently seen by the EP team. Sotalol was up titrated and ICD was adjusted. Lopressor was added.  Following stabilization, he underwent cardiac catheterization: Patent LIMA-LAD; SVG-OM; and SVG-PDA grafts; EF 15%. Continued medical therapy was recommended.  Coumadin was resumed, and has since been followed in our clinic. Plavix and ASA were discontinued, the latter secondary to concomitant Coumadin treatment.  Clinically, patient has felt much better, reporting more energy and less dyspnea. He denies any recurrent CP. He denies any tachycardia palpitations or ICD shock.  Patient weighs himself daily, reports stable weights, and adheres to strict dietary sodium restriction. He denies symptoms suggestive of decompensated heart failure. His weight is up 1 pound, since last OV in April.  No Known Allergies  Current Outpatient Prescriptions  Medication Sig Dispense Refill  . ALDACTONE 25 MG tablet TAKE 1 TABLET ONCE DAILY.  30 each  6  . budesonide (PULMICORT) 0.5 MG/2ML nebulizer solution Take 0.5 mg by nebulization 2 (two) times daily.      [name] FLONASE 50 MCG/ACT nasal spray USE (2) SPRAYS IN EACH NOSTRIL ONCE DAILY.  16 g  2  . formoterol (PERFOROMIST) 20 MCG/2ML nebulizer solution Take 20 mcg by nebulization 2 (two) times daily.      . furosemide (LASIX) 40 MG tablet Take 60 mg by mouth daily.      [name] loratadine (CLARITIN) 10 MG tablet Take 10 mg by mouth daily. For allergies      . megestrol (MEGACE) 40 MG/ML suspension as needed.      . metoprolol succinate (TOPROL-XL) 25 MG 24 hr  tablet Take 25 mg by mouth daily.      . metoprolol tartrate (LOPRESSOR) 12.5 mg TABS Take 0.5 tablets (12.5 mg total) by mouth 2 (two) times daily.  30 tablet  6  . nitroGLYCERIN (NITROSTAT) 0.4 MG SL tablet Place 1 tablet (0.4 mg total) under the tongue every 5 (five) minutes as needed for chest pain.  25 each  3  . omeprazole (PRILOSEC) 20 MG capsule Take 20 mg by mouth daily.      . predniSONE (DELTASONE) 5 MG tablet Take 5 mg by mouth every other day.      . simvastatin (ZOCOR) 40 MG tablet Take 40 mg by mouth at bedtime.        . sotalol (BETAPACE) 120 MG tablet Take 1 tablet (120 mg total) by mouth 2 (two) times daily.  60 tablet  3  . valsartan (DIOVAN) 80 MG tablet Take 40 mg by mouth daily.        [name] warfarin (COUMADIN) 5 MG tablet Take 0.5-5 mg by mouth daily. Mondays and fridays take 5mg  and rest of days take half of the 5mg  tablet.      [name] DISCONTD: warfarin (COUMADIN) 5 MG tablet Take 1 tablet (5 mg total) by mouth daily.  30 tablet  3    Past Medical History  Diagnosis Date  . Hypertension   . Coronary artery disease     a. CABG 03/2006. b. NSTEMI 03/2012 following VT likely demand ischemia - grafts patent at cath.  . Diabetes mellitus   .  Other specified forms of chronic ischemic heart disease   . Syncope and collapse   . Automatic implantable cardiac defibrillator in situ     QRS duration 120 ms no typical left bundle branch block.  . Atrial fibrillation     Taking sotalol, intolerant to amiodarone  . Valvular heart disease     Mild to moderate mitral valve insufficiency and mild aortic insufficiency  . Chronic airway obstruction, not elsewhere classified   . Old myocardial infarction   . Other and unspecified hyperlipidemia   . Congestive heart failure, unspecified     a. Ischemic cardiomyopathy ejection fraction 30-35% previously. b. Down to 15% by cath 03/2012.  [name] Cerebrovascular disease, unspecified   . Other mechanical complication of other internal orthopedic device,  implant, and graft   . Carotid artery disease     s/p LCEA 2007.  [name] Head and neck cancer     Receiving radiation therapy.  . Orthostatic hypotension   . Ventricular tachycardia     a. S/p ICD implantation. b. Recurrent in 03/2012 with medication adjustment.    Past Surgical History  Procedure Date  . Repeat otif left knee with figure-of-eight tension band.   . Implantation of a dual-chamber implantable 06/18/2007  . Left carotid endarterectomy   . Coronary artery bypass graft 04/04/2006    History   Social History  . Marital Status: Married    Spouse Name: N/A    Number of Children: N/A  . Years of Education: N/A   Occupational History  . RETIRED    Social History Main Topics  . Smoking status: Former Smoker -- 2.0 packs/day for 60 years    Types: Cigarettes    Quit date: 08/06/1990  . Smokeless tobacco: Never Used  . Alcohol Use: No  . Drug Use: No  . Sexually Active: Not on file   Other Topics Concern  . Not on file   Social History Narrative   Pt does not get regular exercise.    No family history on file.  ROS: no nausea, vomiting; no fever, chills; no melena, hematochezia; no claudication  PHYSICAL EXAM: BP 133/80  Pulse 72  Ht 5\' 9"  (1.753 m)  Wt 165 lb (74.844 kg)  BMI 24.37 kg/m2 GENERAL: 78 year old male, sitting upright; NAD HEENT: NCAT, PERRLA, EOMI; sclera clear; no xanthelasma NECK: palpable bilateral carotid pulses, no bruits; no JVD; no TM LUNGS: CTA bilaterally CARDIAC: RRR (S1, S2); soft 2/6 systolic ejection murmur; no rubs or gallops ABDOMEN: soft, non-tender; intact BS EXTREMETIES: Palpable right femoral pulse with low bruit, faint contralateral bruit; no significant peripheral edema SKIN: warm/dry; no obvious rash/lesions MUSCULOSKELETAL: no joint deformity NEURO: no focal deficit; NL affect   EKG: reviewed and available in Electronic Records   ASSESSMENT & PLAN:  Coronary artery disease Continue current medication regimen,  with simplification of Lopressor to Toprol-XL 25 mg daily, for improved compliance. Patient will start after finishing current supply of Lopressor.  ISCHEMIC CARDIOMYOPATHY Continue current medication regimen, with simplification of Lopressor to Toprol-XL 25 mg daily, for improved compliance. Patient will start after finishing current supply of Lopressor. Will check followup metabolic profile/magnesium level, given that patient is on Aldactone.  Atrial fibrillation On chronic Coumadin, due for followup INR today. 12-lead EKG today suggests junctional rhythm at 72 bpm. QT interval stable. Patient is not on digoxin.  VENTRICULAR TACHYCARDIA No interim ICD shocks, per patient. Continue current dose sotalol with recent addition of Lopressor. Followup with Dr. Johney Frame, as scheduled.  Femoral  bruit Will order right groin ultrasound to rule out PSA or AV fistula    Gene Kailany Dinunzio, PAC

## 2012-03-25 ENCOUNTER — Encounter: Payer: Self-pay | Admitting: *Deleted

## 2012-04-11 ENCOUNTER — Ambulatory Visit (INDEPENDENT_AMBULATORY_CARE_PROVIDER_SITE_OTHER): Payer: Medicare Other | Admitting: *Deleted

## 2012-04-11 DIAGNOSIS — I4891 Unspecified atrial fibrillation: Secondary | ICD-10-CM

## 2012-04-11 DIAGNOSIS — Z7901 Long term (current) use of anticoagulants: Secondary | ICD-10-CM

## 2012-05-06 ENCOUNTER — Ambulatory Visit (INDEPENDENT_AMBULATORY_CARE_PROVIDER_SITE_OTHER): Payer: Medicare Other | Admitting: *Deleted

## 2012-05-06 DIAGNOSIS — I4891 Unspecified atrial fibrillation: Secondary | ICD-10-CM

## 2012-05-06 DIAGNOSIS — Z7901 Long term (current) use of anticoagulants: Secondary | ICD-10-CM

## 2012-05-06 LAB — POCT INR: INR: 2.4

## 2012-05-19 ENCOUNTER — Other Ambulatory Visit: Payer: Self-pay | Admitting: Cardiology

## 2012-05-23 ENCOUNTER — Ambulatory Visit: Payer: Self-pay | Admitting: Cardiology

## 2012-05-24 ENCOUNTER — Other Ambulatory Visit: Payer: Self-pay | Admitting: Cardiology

## 2012-06-03 ENCOUNTER — Ambulatory Visit (INDEPENDENT_AMBULATORY_CARE_PROVIDER_SITE_OTHER): Payer: Medicare Other | Admitting: *Deleted

## 2012-06-03 DIAGNOSIS — I4891 Unspecified atrial fibrillation: Secondary | ICD-10-CM

## 2012-06-03 DIAGNOSIS — Z7901 Long term (current) use of anticoagulants: Secondary | ICD-10-CM

## 2012-06-03 LAB — POCT INR: INR: 2.2

## 2012-06-13 ENCOUNTER — Ambulatory Visit (INDEPENDENT_AMBULATORY_CARE_PROVIDER_SITE_OTHER): Payer: Medicare Other | Admitting: Cardiology

## 2012-06-13 ENCOUNTER — Encounter: Payer: Self-pay | Admitting: Cardiology

## 2012-06-13 VITALS — BP 138/85 | HR 75 | Ht 69.0 in | Wt 165.4 lb

## 2012-06-13 DIAGNOSIS — I4891 Unspecified atrial fibrillation: Secondary | ICD-10-CM

## 2012-06-13 DIAGNOSIS — I2589 Other forms of chronic ischemic heart disease: Secondary | ICD-10-CM

## 2012-06-13 NOTE — Patient Instructions (Addendum)

## 2012-06-13 NOTE — Progress Notes (Signed)
HPI The patient presents for followup of his ischemic cardiomyopathy and ventricular tachycardia. He was in the hospital earlier this year for management of this. Since then he has done well. He says he thinks he is finally starting to be a little less fatigued. He has not had any recurrent tachypalpitations. He has had no presyncope or syncope. He's not had any new shortness of breath, PND or orthopnea. He's not had any weight gain or edema. He's not quite back to baseline being more tired than he used to be however.  No Known Allergies  Current Outpatient Prescriptions  Medication Sig Dispense Refill  . ALDACTONE 25 MG tablet TAKE 1 TABLET ONCE DAILY.  30 each  6  . budesonide (PULMICORT) 0.5 MG/2ML nebulizer solution Take 0.5 mg by nebulization 2 (two) times daily.      [name] FLONASE 50 MCG/ACT nasal spray USE (2) SPRAYS IN EACH NOSTRIL ONCE DAILY.  16 g  2  . formoterol (PERFOROMIST) 20 MCG/2ML nebulizer solution Take 20 mcg by nebulization 2 (two) times daily.      [name] LASIX 40 MG tablet TAKE 1 & 1/2 TABLET DAILY.  45 tablet  0  . loratadine (CLARITIN) 10 MG tablet Take 10 mg by mouth daily. For allergies      . megestrol (MEGACE) 40 MG/ML suspension as needed.      . metoprolol succinate (TOPROL-XL) 25 MG 24 hr tablet Take 1 tablet (25 mg total) by mouth daily.  30 tablet  6  . nitroGLYCERIN (NITROSTAT) 0.4 MG SL tablet Place 1 tablet (0.4 mg total) under the tongue every 5 (five) minutes as needed for chest pain.  25 each  3  . omeprazole (PRILOSEC) 20 MG capsule Take 20 mg by mouth daily.      . predniSONE (DELTASONE) 5 MG tablet Take 5 mg by mouth every other day.      . simvastatin (ZOCOR) 40 MG tablet Take 40 mg by mouth at bedtime.        . sotalol (BETAPACE) 120 MG tablet Take 1 tablet (120 mg total) by mouth 2 (two) times daily.  60 tablet  3  . valsartan (DIOVAN) 80 MG tablet Take 40 mg by mouth daily.        [name] warfarin (COUMADIN) 5 MG tablet Take 0.5-5 mg by mouth daily. Mondays  and fridays take 5mg  and rest of days take half of the 5mg  tablet.      . warfarin (COUMADIN) 5 MG tablet TAKE 1 TABLET DAILY OR AS DIRECTED BY COUMADIN CLINIC  30 tablet  3  . [DISCONTINUED] warfarin (COUMADIN) 5 MG tablet Take 1 tablet (5 mg total) by mouth daily.  30 tablet  3    Past Medical History  Diagnosis Date  . Hypertension   . Coronary artery disease     a. CABG 03/2006. b. NSTEMI 03/2012 following VT likely demand ischemia - grafts patent at cath.  . Diabetes mellitus   . Other specified forms of chronic ischemic heart disease   . Syncope and collapse   . Automatic implantable cardiac defibrillator in situ     QRS duration 120 ms no typical left bundle branch block.  . Atrial fibrillation     Taking sotalol, intolerant to amiodarone  . Valvular heart disease     Mild to moderate mitral valve insufficiency and mild aortic insufficiency  . Chronic airway obstruction, not elsewhere classified   . Old myocardial infarction   . Other and unspecified hyperlipidemia   .  Congestive heart failure, unspecified     a. Ischemic cardiomyopathy ejection fraction 30-35% previously. b. Down to 15% by cath 03/2012.  [name] Cerebrovascular disease, unspecified   . Other mechanical complication of other internal orthopedic device, implant, and graft   . Carotid artery disease     s/p LCEA 2007.  [name] Head and neck cancer     Receiving radiation therapy.  . Orthostatic hypotension   . Ventricular tachycardia     a. S/p ICD implantation. b. Recurrent in 03/2012 with medication adjustment.    Past Surgical History  Procedure Date  . Repeat otif left knee with figure-of-eight tension band.   . Implantation of a dual-chamber implantable 06/18/2007  . Left carotid endarterectomy   . Coronary artery bypass graft 04/04/2006    ROS:  As stated in the HPI and negative for all other systems.  PHYSICAL EXAM BP 138/85  Pulse 75  Ht 5\' 9"  (1.753 m)  Wt 165 lb 6.4 oz (75.025 kg)  BMI 24.43  kg/m2 GENERAL:  Well appearing HEENT:  Pupils equal round and reactive, fundi not visualized, oral mucosa unremarkable NECK:  No jugular venous distention, waveform within normal limits, carotid upstroke brisk and symmetric, no bruits, no thyromegaly LYMPHATICS:  No cervical, inguinal adenopathy LUNGS:  Clear to auscultation bilaterally BACK:  No CVA tenderness CHEST:  Well healed ICD pocket HEART:  PMI not displaced or sustained,S1 and S2 within normal limits, no S3,  no clicks, no rubs, no murmurs, irregular ABD:  Flat, positive bowel sounds normal in frequency in pitch, no bruits, no rebound, no guarding, no midline pulsatile mass, no hepatomegaly, no splenomegaly EXT:  2 plus pulses throughout, no edema, no cyanosis no clubbing, missing the fingers on the right hand SKIN:  No rashes no nodules NEURO:  Cranial nerves II through XII grossly intact, motor grossly intact throughout PSYCH:  Cognitively intact, oriented to person place and time  EKG:  Atrial fibrillation, rate 75, axis within normal limits, intervals within normal limits, no acute ST-T wave changes.  ASSESSMENT AND PLAN  Coronary artery disease  The patient has no new sypmtoms.  No further cardiovascular testing is indicated.  We will continue with aggressive risk reduction and meds as listed.   ISCHEMIC CARDIOMYOPATHY  He seems to be euvolemic.  At this point, no change in therapy is indicated.  We have reviewed salt and fluid restrictions.  No further cardiovascular testing is indicated.  Atrial fibrillation  He tolerates this rhythm with rate control and anticoagulation. No change in therapy is planned.  VENTRICULAR TACHYCARDIA  The patient has had no further. No further workup is planned. He will continue the meds as listed.

## 2012-06-15 ENCOUNTER — Encounter: Payer: Self-pay | Admitting: *Deleted

## 2012-06-16 ENCOUNTER — Ambulatory Visit: Payer: Self-pay | Admitting: Cardiology

## 2012-06-20 ENCOUNTER — Ambulatory Visit (INDEPENDENT_AMBULATORY_CARE_PROVIDER_SITE_OTHER): Payer: Medicare Other | Admitting: Internal Medicine

## 2012-06-20 ENCOUNTER — Encounter: Payer: Self-pay | Admitting: Internal Medicine

## 2012-06-20 VITALS — BP 114/64 | HR 77 | Ht 69.0 in | Wt 164.0 lb

## 2012-06-20 DIAGNOSIS — I10 Essential (primary) hypertension: Secondary | ICD-10-CM

## 2012-06-20 DIAGNOSIS — I472 Ventricular tachycardia, unspecified: Secondary | ICD-10-CM

## 2012-06-20 DIAGNOSIS — I2589 Other forms of chronic ischemic heart disease: Secondary | ICD-10-CM

## 2012-06-20 DIAGNOSIS — I4891 Unspecified atrial fibrillation: Secondary | ICD-10-CM

## 2012-06-20 LAB — ICD DEVICE OBSERVATION
AL IMPEDENCE ICD: 350 Ohm
AL THRESHOLD: 0.75 V
ATRIAL PACING ICD: 94 pct
BAMS-0001: 150 {beats}/min
BAMS-0003: 70 {beats}/min
BATTERY VOLTAGE: 2.5718 V
CHARGE TIME: 13.6 s
DEV-0020ICD: NEGATIVE
MODE SWITCH EPISODES: 1
PACEART VT: 0
RV LEAD THRESHOLD: 1.5 V
TOT-0006: 20081112000000
TOT-0007: 1
TZAT-0001SLOWVT: 1
TZAT-0004FASTVT: 8
TZAT-0012FASTVT: 200 ms
TZAT-0012SLOWVT: 200 ms
TZAT-0013FASTVT: 1
TZAT-0018FASTVT: NEGATIVE
TZAT-0020SLOWVT: 1 ms
TZON-0003FASTVT: 330 ms
TZON-0005SLOWVT: 6
TZST-0001FASTVT: 2
TZST-0001FASTVT: 3
TZST-0001FASTVT: 5
TZST-0003FASTVT: 36 J
TZST-0003FASTVT: 36 J
TZST-0003FASTVT: 36 J
VENTRICULAR PACING ICD: 9.4 pct

## 2012-06-20 MED ORDER — METOPROLOL SUCCINATE ER 50 MG PO TB24: 50.0000 mg | ORAL_TABLET | Freq: Every day | ORAL | Status: DC

## 2012-06-20 NOTE — Progress Notes (Signed)
PCP:  Louie Boston, MD Primary Cardiologist:  Dr Antoine Poche [facility] patient)  The patient presents today for routine electrophysiology followup.  Since his recent hospitalization for VT, the patient reports doing reasonably well.   He has had several episodes of ATP terminated VT for which he was symptomatic with symptoms of palpitations and presyncope.   Today, he denies symptoms of  chest pain, shortness of breath (above baseline), orthopnea, PND, lower extremity edema, frank syncope syncope, or neurologic sequela.  The patient feels that he is tolerating medications without difficulties and is otherwise without complaint today.   Past Medical History  Diagnosis Date  . Hypertension   . Coronary artery disease     a. CABG 03/2006. b. NSTEMI 03/2012 following VT likely demand ischemia - grafts patent at cath.  . Diabetes mellitus   . Other specified forms of chronic ischemic heart disease   . Syncope and collapse   . Automatic implantable cardiac defibrillator in situ     QRS duration 120 ms no typical left bundle branch block.  . Atrial fibrillation     Taking sotalol, intolerant to amiodarone  . Valvular heart disease     Mild to moderate mitral valve insufficiency and mild aortic insufficiency  . Chronic airway obstruction, not elsewhere classified   . Old myocardial infarction   . Other and unspecified hyperlipidemia   . Congestive heart failure, unspecified     a. Ischemic cardiomyopathy ejection fraction 30-35% previously. b. Down to 15% by cath 03/2012.  [name] Cerebrovascular disease, unspecified   . Other mechanical complication of other internal orthopedic device, implant, and graft   . Carotid artery disease     s/p LCEA 2007.  [name] Head and neck cancer     Receiving radiation therapy.  . Orthostatic hypotension   . Ventricular tachycardia     a. S/p ICD implantation. b. Recurrent in 03/2012 with medication adjustment.   Past Surgical History  Procedure Date  . Repeat otif left knee  with figure-of-eight tension band.   . Implantation of a dual-chamber implantable 06/18/2007  . Left carotid endarterectomy   . Coronary artery bypass graft 04/04/2006    Current Outpatient Prescriptions  Medication Sig Dispense Refill  . ALDACTONE 25 MG tablet TAKE 1 TABLET ONCE DAILY.  30 each  6  . budesonide (PULMICORT) 0.5 MG/2ML nebulizer solution Take 0.5 mg by nebulization 2 (two) times daily.      [name] FLONASE 50 MCG/ACT nasal spray USE (2) SPRAYS IN EACH NOSTRIL ONCE DAILY.  16 g  2  . formoterol (PERFOROMIST) 20 MCG/2ML nebulizer solution Take 20 mcg by nebulization 2 (two) times daily.      [name] LASIX 40 MG tablet TAKE 1 & 1/2 TABLET DAILY.  45 tablet  0  . loratadine (CLARITIN) 10 MG tablet Take 10 mg by mouth daily. For allergies      . megestrol (MEGACE) 40 MG/ML suspension as needed.      . metoprolol succinate (TOPROL-XL) 50 MG 24 hr tablet Take 1 tablet (50 mg total) by mouth daily.  30 tablet  6  . nitroGLYCERIN (NITROSTAT) 0.4 MG SL tablet Place 1 tablet (0.4 mg total) under the tongue every 5 (five) minutes as needed for chest pain.  25 each  3  . omeprazole (PRILOSEC) 20 MG capsule Take 20 mg by mouth daily.      . predniSONE (DELTASONE) 5 MG tablet Take 5 mg by mouth every other day.      . simvastatin (  ZOCOR) 40 MG tablet Take 40 mg by mouth at bedtime.        . sotalol (BETAPACE) 120 MG tablet Take 1 tablet (120 mg total) by mouth 2 (two) times daily.  60 tablet  3  . valsartan (DIOVAN) 80 MG tablet Take 40 mg by mouth daily.        [name] warfarin (COUMADIN) 5 MG tablet Take 0.5-5 mg by mouth daily. Mondays and fridays take 5mg  and rest of days take half of the 5mg  tablet.      . warfarin (COUMADIN) 5 MG tablet TAKE 1 TABLET DAILY OR AS DIRECTED BY COUMADIN CLINIC  30 tablet  3  . [DISCONTINUED] metoprolol succinate (TOPROL-XL) 25 MG 24 hr tablet Take 1 tablet (25 mg total) by mouth daily.  30 tablet  6  . [DISCONTINUED] warfarin (COUMADIN) 5 MG tablet Take 1 tablet (5 mg  total) by mouth daily.  30 tablet  3    No Known Allergies  History   Social History  . Marital Status: Married    Spouse Name: N/A    Number of Children: N/A  . Years of Education: N/A   Occupational History  . RETIRED    Social History Main Topics  . Smoking status: Former Smoker -- 2.0 packs/day for 60 years    Types: Cigarettes    Quit date: 08/06/1990  . Smokeless tobacco: Never Used  . Alcohol Use: No  . Drug Use: No  . Sexually Active: Not on file   Other Topics Concern  . Not on file   Social History Narrative   Pt does not get regular exercise.    Physical Exam: Filed Vitals:   06/20/12 1326  BP: 114/64  Pulse: 77  Height: 5\' 9"  (1.753 m)  Weight: 164 lb (74.39 kg)    GEN- The patient is well appearing, alert and oriented x 3 today.   Head- normocephalic, atraumatic Eyes-  Sclera clear, conjunctiva pink Ears- hearing intact Oropharynx- clear Neck- supple,  Lungs- prolonged expiratory phase, normal work of breathing Chest- ICD pocket is well healed Heart- Regular rate and rhythm, no murmurs, rubs or gallops, PMI not laterally displaced GI- soft, NT, ND, + BS Extremities- no clubbing, cyanosis, or edema  ICD interrogation- reviewed in detail today,  See PACEART report  ekg today reveals atrial paced rhythm at 77 bpm, QRS 94 msec, nonspefici ST/ T changes Assessment and Plan:

## 2012-06-20 NOTE — Assessment & Plan Note (Addendum)
Normal ICD function Interrogation today reveals that he has had 6 episodes of VT.  Though he has not received ICD shocks, he has had ATP therapy. See Arita Miss Art report No changes today  VT morphologies appear to be the same with CL 360-380 msec. Prior echo reveals large inferior scar which is likely the source for his VT.  Therapeutic strategies for ventricular tachycardia including medicine and ablation were discussed in detail with the patient today. Risk, benefits, and alternatives to EP study and radiofrequency ablation were also discussed in detail today.  He is willing to consider VT ablation but would like to contemplate this option and discuss further after the first of the year.  If he receives ICD shocks in the interim, then he may be more willing to consider ablation.  I have instructed him to not drive for 6 months given recent VT.  He will return in 2 months for further discussion of ablation.  He should contact the device clinic if he has further VT before then.

## 2012-06-20 NOTE — Patient Instructions (Signed)
   Increase Toprol XL to 50mg  daily  Continue all other current medications. No driving x 6 months  Follow up in 2 months with Dr. Johney Frame

## 2012-06-20 NOTE — Assessment & Plan Note (Signed)
No ischemic symptoms Ischemic VT as above

## 2012-06-20 NOTE — Assessment & Plan Note (Signed)
Given VT, will increase toprol to 50mg  daily

## 2012-06-25 ENCOUNTER — Encounter: Payer: Self-pay | Admitting: Internal Medicine

## 2012-07-08 ENCOUNTER — Other Ambulatory Visit (HOSPITAL_COMMUNITY): Payer: Self-pay | Admitting: Dentistry

## 2012-07-08 MED ORDER — SODIUM FLUORIDE 1.1 % DT GEL: 1.0000 "application " | Freq: Every day | DENTAL | Status: DC

## 2012-07-10 ENCOUNTER — Other Ambulatory Visit: Payer: Self-pay | Admitting: *Deleted

## 2012-07-10 ENCOUNTER — Other Ambulatory Visit: Payer: Self-pay | Admitting: Cardiology

## 2012-07-10 MED ORDER — SOTALOL HCL 120 MG PO TABS: 120.0000 mg | ORAL_TABLET | Freq: Two times a day (BID) | ORAL | Status: DC

## 2012-07-11 ENCOUNTER — Other Ambulatory Visit: Payer: Self-pay | Admitting: Cardiology

## 2012-07-11 NOTE — Telephone Encounter (Signed)
Needs refill on Sotalol 120 mg  1 tablet twice a day sent to [facility]. Pharmacy gave him 4 pills for next two days.Marland KitchenMarland Kitchen

## 2012-07-11 NOTE — Telephone Encounter (Signed)
Nurse called [name] to verify they have prescription for sotalol that was sent on yesterday and the tech said that the prescription is ready and they would call and let patient know.

## 2012-07-14 ENCOUNTER — Inpatient Hospital Stay (HOSPITAL_COMMUNITY)
Admission: EM | Admit: 2012-07-14 | Discharge: 2012-07-16 | DRG: 309 | Disposition: A | Discharge status: Home / Self Care | Payer: Medicare Other | Attending: Internal Medicine | Admitting: Internal Medicine

## 2012-07-14 ENCOUNTER — Telehealth: Payer: Self-pay | Admitting: *Deleted

## 2012-07-14 ENCOUNTER — Encounter (HOSPITAL_COMMUNITY): Payer: Self-pay | Admitting: *Deleted

## 2012-07-14 ENCOUNTER — Emergency Department (HOSPITAL_COMMUNITY): Payer: Medicare Other

## 2012-07-14 DIAGNOSIS — I472 Ventricular tachycardia, unspecified: Principal | ICD-10-CM | POA: Diagnosis present

## 2012-07-14 DIAGNOSIS — Z95810 Presence of automatic (implantable) cardiac defibrillator: Secondary | ICD-10-CM

## 2012-07-14 DIAGNOSIS — J449 Chronic obstructive pulmonary disease, unspecified: Secondary | ICD-10-CM | POA: Diagnosis present

## 2012-07-14 DIAGNOSIS — I2510 Atherosclerotic heart disease of native coronary artery without angina pectoris: Secondary | ICD-10-CM | POA: Diagnosis present

## 2012-07-14 DIAGNOSIS — Z79899 Other long term (current) drug therapy: Secondary | ICD-10-CM

## 2012-07-14 DIAGNOSIS — E119 Type 2 diabetes mellitus without complications: Secondary | ICD-10-CM | POA: Diagnosis present

## 2012-07-14 DIAGNOSIS — I10 Essential (primary) hypertension: Secondary | ICD-10-CM | POA: Diagnosis present

## 2012-07-14 DIAGNOSIS — Z951 Presence of aortocoronary bypass graft: Secondary | ICD-10-CM

## 2012-07-14 DIAGNOSIS — I4729 Other ventricular tachycardia: Principal | ICD-10-CM | POA: Diagnosis present

## 2012-07-14 DIAGNOSIS — Z7901 Long term (current) use of anticoagulants: Secondary | ICD-10-CM

## 2012-07-14 DIAGNOSIS — IMO0002 Reserved for concepts with insufficient information to code with codable children: Secondary | ICD-10-CM

## 2012-07-14 DIAGNOSIS — I080 Rheumatic disorders of both mitral and aortic valves: Secondary | ICD-10-CM | POA: Diagnosis present

## 2012-07-14 DIAGNOSIS — E785 Hyperlipidemia, unspecified: Secondary | ICD-10-CM | POA: Diagnosis present

## 2012-07-14 DIAGNOSIS — I2589 Other forms of chronic ischemic heart disease: Secondary | ICD-10-CM | POA: Diagnosis present

## 2012-07-14 DIAGNOSIS — C760 Malignant neoplasm of head, face and neck: Secondary | ICD-10-CM | POA: Diagnosis present

## 2012-07-14 DIAGNOSIS — I5022 Chronic systolic (congestive) heart failure: Secondary | ICD-10-CM | POA: Diagnosis present

## 2012-07-14 DIAGNOSIS — I4891 Unspecified atrial fibrillation: Secondary | ICD-10-CM | POA: Diagnosis present

## 2012-07-14 DIAGNOSIS — J4489 Other specified chronic obstructive pulmonary disease: Secondary | ICD-10-CM | POA: Diagnosis present

## 2012-07-14 DIAGNOSIS — I252 Old myocardial infarction: Secondary | ICD-10-CM

## 2012-07-14 LAB — CBC WITH DIFFERENTIAL/PLATELET
Lymphocytes Relative: 9 % — ABNORMAL LOW (ref 12–46)
Lymphs Abs: 0.9 10*3/uL (ref 0.7–4.0)
Neutrophils Relative %: 82 % — ABNORMAL HIGH (ref 43–77)
Platelets: 222 10*3/uL (ref 150–400)
RBC: 4.61 MIL/uL (ref 4.22–5.81)
WBC: 9.4 10*3/uL (ref 4.0–10.5)

## 2012-07-14 LAB — PROTIME-INR: Prothrombin Time: 21.1 seconds — ABNORMAL HIGH (ref 11.6–15.2)

## 2012-07-14 LAB — POCT I-STAT, CHEM 8
Glucose, Bld: 109 mg/dL — ABNORMAL HIGH (ref 70–99)
HCT: 41 % (ref 39.0–52.0)
Hemoglobin: 13.9 g/dL (ref 13.0–17.0)
Potassium: 3.8 mEq/L (ref 3.5–5.1)
Sodium: 139 mEq/L (ref 135–145)
TCO2: 27 mmol/L (ref 0–100)

## 2012-07-14 LAB — MAGNESIUM: Magnesium: 2.3 mg/dL (ref 1.5–2.5)

## 2012-07-14 LAB — MRSA PCR SCREENING: MRSA by PCR: NEGATIVE

## 2012-07-14 LAB — POCT I-STAT TROPONIN I

## 2012-07-14 LAB — TROPONIN I: Troponin I: <0.3 ng/mL (ref <0.30)

## 2012-07-14 MED ORDER — METOPROLOL SUCCINATE ER 50 MG PO TB24
50.0000 mg | ORAL_TABLET | Freq: Every day | ORAL | Status: DC
  Administered 2012-07-15 – 2012-07-16 (×2): 50 mg via ORAL
  Filled 2012-07-14 (×2): qty 1

## 2012-07-14 MED ORDER — WARFARIN - PHARMACIST DOSING INPATIENT: Freq: Every day | Status: DC

## 2012-07-14 MED ORDER — ACETAMINOPHEN 325 MG PO TABS: 650.0000 mg | ORAL_TABLET | ORAL | Status: DC | PRN

## 2012-07-14 MED ORDER — INSULIN ASPART 100 UNIT/ML ~~LOC~~ SOLN: 0.0000 [IU] | Freq: Every day | SUBCUTANEOUS | Status: DC

## 2012-07-14 MED ORDER — MEXILETINE HCL 200 MG PO CAPS
200.0000 mg | ORAL_CAPSULE | Freq: Two times a day (BID) | ORAL | Status: DC
  Administered 2012-07-14: 200 mg via ORAL
  Filled 2012-07-14 (×2): qty 1

## 2012-07-14 MED ORDER — PREDNISONE 5 MG PO TABS
5.0000 mg | ORAL_TABLET | Freq: Every day | ORAL | Status: DC
Start: 2012-07-15 — End: 2012-07-16
  Administered 2012-07-15 – 2012-07-16 (×2): 5 mg via ORAL
  Filled 2012-07-14 (×4): qty 1

## 2012-07-14 MED ORDER — MEXILETINE HCL 200 MG PO CAPS
200.0000 mg | ORAL_CAPSULE | Freq: Two times a day (BID) | ORAL | Status: DC
  Administered 2012-07-15 – 2012-07-16 (×3): 200 mg via ORAL
  Filled 2012-07-14 (×5): qty 1

## 2012-07-14 MED ORDER — SPIRONOLACTONE 25 MG PO TABS
25.0000 mg | ORAL_TABLET | Freq: Every day | ORAL | Status: DC
  Administered 2012-07-15 – 2012-07-16 (×2): 25 mg via ORAL
  Filled 2012-07-14 (×2): qty 1

## 2012-07-14 MED ORDER — WARFARIN SODIUM 5 MG PO TABS
5.0000 mg | ORAL_TABLET | Freq: Once | ORAL | Status: AC
  Administered 2012-07-14: 5 mg via ORAL
  Filled 2012-07-14: qty 1

## 2012-07-14 MED ORDER — SOTALOL HCL 80 MG PO TABS
80.0000 mg | ORAL_TABLET | Freq: Two times a day (BID) | ORAL | Status: DC
  Administered 2012-07-14 – 2012-07-15 (×2): 80 mg via ORAL
  Filled 2012-07-14 (×3): qty 1

## 2012-07-14 MED ORDER — FLUTICASONE PROPIONATE 50 MCG/ACT NA SUSP
2.0000 | Freq: Every day | NASAL | Status: DC
  Administered 2012-07-15 – 2012-07-16 (×2): 2 via NASAL
  Filled 2012-07-14: qty 16

## 2012-07-14 MED ORDER — LORATADINE 10 MG PO TABS
10.0000 mg | ORAL_TABLET | Freq: Every day | ORAL | Status: DC
  Administered 2012-07-15 – 2012-07-16 (×2): 10 mg via ORAL
  Filled 2012-07-14 (×2): qty 1

## 2012-07-14 MED ORDER — BUDESONIDE 0.5 MG/2ML IN SUSP
0.5000 mg | Freq: Two times a day (BID) | RESPIRATORY_TRACT | Status: DC
  Administered 2012-07-14 – 2012-07-16 (×4): 0.5 mg via RESPIRATORY_TRACT
  Filled 2012-07-14 (×7): qty 2

## 2012-07-14 MED ORDER — SODIUM CHLORIDE 0.9 % IV SOLN: 250.0000 mL | INTRAVENOUS | Status: DC | PRN

## 2012-07-14 MED ORDER — SODIUM CHLORIDE 0.9 % IJ SOLN: 3.0000 mL | INTRAMUSCULAR | Status: DC | PRN

## 2012-07-14 MED ORDER — SIMVASTATIN 40 MG PO TABS
40.0000 mg | ORAL_TABLET | Freq: Every day | ORAL | Status: DC
  Administered 2012-07-14 – 2012-07-15 (×2): 40 mg via ORAL
  Filled 2012-07-14 (×4): qty 1

## 2012-07-14 MED ORDER — FUROSEMIDE 40 MG PO TABS
60.0000 mg | ORAL_TABLET | Freq: Every day | ORAL | Status: DC
  Administered 2012-07-15 – 2012-07-16 (×2): 60 mg via ORAL
  Filled 2012-07-14 (×2): qty 1

## 2012-07-14 MED ORDER — NITROGLYCERIN 0.4 MG SL SUBL: 0.4000 mg | SUBLINGUAL_TABLET | SUBLINGUAL | Status: DC | PRN

## 2012-07-14 MED ORDER — IRBESARTAN 75 MG PO TABS
37.5000 mg | ORAL_TABLET | Freq: Every day | ORAL | Status: DC
  Administered 2012-07-15 – 2012-07-16 (×2): 37.5 mg via ORAL
  Filled 2012-07-14 (×2): qty 0.5

## 2012-07-14 MED ORDER — PANTOPRAZOLE SODIUM 40 MG PO TBEC
40.0000 mg | DELAYED_RELEASE_TABLET | Freq: Every day | ORAL | Status: DC
  Administered 2012-07-15 – 2012-07-16 (×2): 40 mg via ORAL
  Filled 2012-07-14 (×2): qty 1

## 2012-07-14 MED ORDER — POTASSIUM CHLORIDE CRYS ER 20 MEQ PO TBCR
40.0000 meq | EXTENDED_RELEASE_TABLET | Freq: Once | ORAL | Status: AC
  Administered 2012-07-14: 40 meq via ORAL
  Filled 2012-07-14: qty 2

## 2012-07-14 MED ORDER — ONDANSETRON HCL 4 MG/2ML IJ SOLN: 4.0000 mg | Freq: Four times a day (QID) | INTRAMUSCULAR | Status: DC | PRN

## 2012-07-14 MED ORDER — INSULIN ASPART 100 UNIT/ML ~~LOC~~ SOLN: 0.0000 [IU] | Freq: Three times a day (TID) | SUBCUTANEOUS | Status: DC

## 2012-07-14 MED ORDER — SODIUM CHLORIDE 0.9 % IJ SOLN
3.0000 mL | Freq: Two times a day (BID) | INTRAMUSCULAR | Status: DC
  Administered 2012-07-14 – 2012-07-16 (×4): 3 mL via INTRAVENOUS

## 2012-07-14 MED ORDER — ARFORMOTEROL TARTRATE 15 MCG/2ML IN NEBU
15.0000 ug | INHALATION_SOLUTION | Freq: Two times a day (BID) | RESPIRATORY_TRACT | Status: DC
  Administered 2012-07-14 – 2012-07-16 (×4): 15 ug via RESPIRATORY_TRACT
  Filled 2012-07-14 (×6): qty 2

## 2012-07-14 NOTE — ED Notes (Signed)
Reports AICD fired 4 times today, first at 1030. States 3 were small shocks & one was a big shock. Denies ever having any CP, palpitations, SOB. Does report having felt lightheaded & dizzy prior to shocks. States defib fired twice last Tues. Presently pt with no voiced complaints.

## 2012-07-14 NOTE — ED Notes (Addendum)
[place] Cardiologist MD at bedside. Rep for [facility] AICD at bedside interrogating device

## 2012-07-14 NOTE — ED Provider Notes (Signed)
History     CSN: [id number]  Arrival date & time 07/14/12  1230   First MD Initiated Contact with Patient 07/14/12 1245      Chief Complaint  Patient presents with  . Defib fired times 4 this am     (Consider location/radiation/quality/duration/timing/severity/associated sxs/prior treatment) The history is provided by the patient.   patient presents after his AICD fired. He states he felt his 4 episodes this morning. He states it felt take them once and felt a few other f this was. He felt a little dizzy. Is a previous history of ventricular tachycardia. His been doing well the last few days. A chest pain. No cough. He feels better now.  Past Medical History  Diagnosis Date  . Hypertension   . Coronary artery disease     a. CABG 03/2006. b. NSTEMI 03/2012 following VT likely demand ischemia - grafts patent at cath.  . Diabetes mellitus   . Other specified forms of chronic ischemic heart disease   . Syncope and collapse   . Automatic implantable cardiac defibrillator in situ     QRS duration 120 ms no typical left bundle branch block.  . Atrial fibrillation     Taking sotalol, intolerant to amiodarone  . Valvular heart disease     Mild to moderate mitral valve insufficiency and mild aortic insufficiency  . Chronic airway obstruction, not elsewhere classified   . Old myocardial infarction   . Other and unspecified hyperlipidemia   . Congestive heart failure, unspecified     a. Ischemic cardiomyopathy ejection fraction 30-35% previously. b. Down to 15% by cath 03/2012.  [name] Cerebrovascular disease, unspecified   . Other mechanical complication of other internal orthopedic device, implant, and graft   . Carotid artery disease     s/p LCEA 2007.  [name] Head and neck cancer     Receiving radiation therapy.  . Orthostatic hypotension   . Ventricular tachycardia     a. S/p ICD implantation. b. Recurrent in 03/2012 with medication adjustment.    Past Surgical History  Procedure Date  .  Repeat otif left knee with figure-of-eight tension band.   . Implantation of a dual-chamber implantable 06/18/2007  . Left carotid endarterectomy   . Coronary artery bypass graft 04/04/2006    No family history on file.  History  Substance Use Topics  . Smoking status: Former Smoker -- 2.0 packs/day for 60 years    Types: Cigarettes    Quit date: 08/06/1990  . Smokeless tobacco: Never Used  . Alcohol Use: No      Review of Systems  Constitutional: Negative for activity change and appetite change.  HENT: Negative for neck stiffness.   Eyes: Negative for pain.  Respiratory: Negative for chest tightness and shortness of breath.   Cardiovascular: Negative for chest pain and leg swelling.  Gastrointestinal: Negative for nausea, vomiting, abdominal pain and diarrhea.  Genitourinary: Negative for flank pain.  Musculoskeletal: Negative for back pain.  Skin: Negative for rash.  Neurological: Positive for dizziness. Negative for weakness, numbness and headaches.  Psychiatric/Behavioral: Negative for behavioral problems.    Allergies  Review of patient's allergies indicates no known allergies.  Home Medications   Current Outpatient Rx  Name  Route  Sig  Dispense  Refill  . ACETAMINOPHEN 500 MG PO TABS   Oral   Take 1,000 mg by mouth every 6 (six) hours as needed. For pain         . ALDACTONE 25 MG PO TABS  TAKE 1 TABLET ONCE DAILY.   30 each   6   . BUDESONIDE 0.5 MG/2ML IN SUSP   Nebulization   Take 0.5 mg by nebulization 2 (two) times daily.         [name] FLONASE 50 MCG/ACT NA SUSP      USE (2) SPRAYS IN EACH NOSTRIL ONCE DAILY.   16 g   2   . FORMOTEROL FUMARATE 20 MCG/2ML IN NEBU   Nebulization   Take 20 mcg by nebulization 2 (two) times daily.         . FUROSEMIDE 40 MG PO TABS   Oral   Take 60 mg by mouth daily.         [name] LORATADINE 10 MG PO TABS   Oral   Take 10 mg by mouth daily. For allergies         . METOPROLOL SUCCINATE ER 50 MG PO  TB24   Oral   Take 1 tablet (50 mg total) by mouth daily.   30 tablet   6     Dose increased on 06/20/2012.   [name] NITROGLYCERIN 0.4 MG SL SUBL   Sublingual   Place 1 tablet (0.4 mg total) under the tongue every 5 (five) minutes as needed for chest pain.   25 each   3   . OMEPRAZOLE 20 MG PO CPDR   Oral   Take 20 mg by mouth daily.         [name] PREDNISONE 5 MG PO TABS   Oral   Take 5 mg by mouth daily.          [name] SIMVASTATIN 40 MG PO TABS   Oral   Take 40 mg by mouth at bedtime.           . SODIUM FLUORIDE 1.1 % DT GEL   dental   Place 1 application onto teeth at bedtime. Cal Dr. Everardo Beals in [place], [place] for future refills.   120 mL   0     Dispense as written.   [name] SOTALOL HCL 120 MG PO TABS   Oral   Take 1 tablet (120 mg total) by mouth 2 (two) times daily.   60 tablet   3   . VALSARTAN 80 MG PO TABS   Oral   Take 40 mg by mouth daily.           . WARFARIN SODIUM 5 MG PO TABS   Oral   Take 2.5-5 mg by mouth daily. Mondays and fridays take 5mg  and rest of days take half of the 5mg  tablet (2.5mg )           BP 119/76  Pulse 75  Temp 97.9 F (36.6 C) (Oral)  Resp 17  SpO2 100%  Physical Exam  Nursing note and vitals reviewed. Constitutional: He is oriented to person, place, and time. He appears well-developed and well-nourished.  HENT:  Head: Normocephalic and atraumatic.  Eyes: EOM are normal. Pupils are equal, round, and reactive to light.  Neck: Normal range of motion. Neck supple.  Cardiovascular: Normal rate, regular rhythm and normal heart sounds.   No murmur heard. Pulmonary/Chest: Effort normal and breath sounds normal.       AICD left chest wall  Abdominal: Soft. Bowel sounds are normal. He exhibits no distension and no mass. There is no tenderness. There is no rebound and no guarding.  Musculoskeletal: Normal range of motion. He exhibits no edema.  Neurological: He is alert and oriented to  person, place, and time. No cranial nerve deficit.   Skin: Skin is warm and dry.  Psychiatric: He has a normal mood and affect.    ED Course  Procedures (including critical care time)  Labs Reviewed  CBC WITH DIFFERENTIAL - Abnormal; Notable for the following:    Neutrophils Relative 82 (*)     Lymphocytes Relative 9 (*)     All other components within normal limits  PROTIME-INR - Abnormal; Notable for the following:    Prothrombin Time 21.1 (*)     INR 1.90 (*)     All other components within normal limits  POCT I-STAT, CHEM 8 - Abnormal; Notable for the following:    Glucose, Bld 109 (*)     All other components within normal limits  MAGNESIUM  POCT I-STAT TROPONIN I   Dg Chest Port 1 View  07/14/2012  *RADIOLOGY REPORT*  Clinical Data: AICD fired.  Coronary artery disease.  Diabetes. Valvular heart disease.  Head and neck cancer.  PORTABLE CHEST - 1 VIEW  Comparison: 03/05/2012  Findings: Mild cardiomegaly stable.  Changes of COPD are again seen.  No evidence of acute infiltrate or congestive heart failure. No evidence of pleural effusion.  AICD remains in appropriate position.  Prior CABG again noted.  IMPRESSION: Stable cardiomegaly and COPD.  No acute findings.   Original Report Authenticated By: Myles Rosenthal, M.D.      1. Ventricular tachycardia       MDM  Patient presents after his AICD fired. Per interrogation he had 22 episodes of ventricular tachycardia. All but one were controlled by overdrive pacing. Once required defibrillation. Lab work is otherwise reassuring. Troponin is negative this time. He was seen in the ER by cardiology        Juliet Rude. Rubin Payor, MD 07/14/12 [phone]

## 2012-07-14 NOTE — H&P (Signed)
History and Physical  Patient ID: [name] MRN: [id number], SOB: 16-Jan-1929 78 y.o. Date of Encounter: 07/14/2012, 3:34 PM  Primary Physician: [name] Boston, MD Primary Cardiologist: Dr [name] (in [place]) Primary Electrophysiologist: Dr. Johney Page  Chief Complaint: ICD shock  HPI: 78 y.o. male w/ PMHx significant for CAD (s/p CABG '07), ICM (EF 15%, s/p ICD '08), VT (on sotalol), A.Fib (on coumadin), Carotid Artery Disease, HTN, and DMII who presented to [facility] on 07/14/2012 after being shocked by his ICD.   Patient has a history of VT previously maintained on amiodarone, but due to nausea this was switched to Sotalol (~08/2010). He was hospitalized 03/2012 with slow VT complicated by hypotension requiring unsynchronized cardioversion. During this hospitalization cath showed patent grafts, EF 15%. Sotalol was increased to 120mg  bid, metoprolol added, and ICD adjusted (VT zone at 150-180 bpm with ATP therapies to be delivered). He was seen in clinic by Dr. Johney Page on 06/20/12 at which time ICD interrogation revealed 6 episodes of VT with AT, but without shock (VT morphologies appeared to be the same with CL 360-380 msec). Therapeutic strategies for VT including medicine and ablation were discussed with [name] who requested time to contemplate his options and discuss after the first of the year.   Patient reports overall feeling well since his visit with Dr. Johney Page. Denies chest pain, orthopnea, swelling, weight gain. Has stable shortness of breath and dyspnea on exertion. He reports over the last two weeks has had episodes of sudden dizziness and visual changes that only last a few seconds. Last Tuesday while riding in the car he experienced dizziness and flashes of bright light followed by syncope. He was out for just a few seconds and afterwards felt back to normal. No ICD shock. Today while at home experienced three episodes of dizziness and visual changes then another episode  followed by an ICD shock prompting him to seek medical attention.   ICD interrogation revealed 32 episodes of VT since 11/18 all treated with ATP and one shock (today). EKG revealed sinus rhythm A-paced 83bpm, QTc , no ST/T changes compared to prior EKG. CXR is without acute cardiopulmonary abnormalities. Labs are significant for normal poc troponin, INR 1.9, K+ 3.8, Mg 2.3, unremarkable CBC. He is feeling well and hemodynamically stable.   Past Medical History  Diagnosis Date  . Hypertension   . Coronary artery disease     a. CABG 03/2006. b. NSTEMI 03/2012 following VT likely demand ischemia - grafts patent at cath.  . Diabetes mellitus   . Other specified forms of chronic ischemic heart disease   . Syncope and collapse   . Automatic implantable cardiac defibrillator in situ     QRS duration 120 ms no typical left bundle branch block.  . Atrial fibrillation     Taking sotalol, intolerant to amiodarone  . Valvular heart disease     Mild to moderate mitral valve insufficiency and mild aortic insufficiency  . Chronic airway obstruction, not elsewhere classified   . Old myocardial infarction   . Other and unspecified hyperlipidemia   . Congestive heart failure, unspecified     a. Ischemic cardiomyopathy ejection fraction 30-35% previously. b. Down to 15% by cath 03/2012.  [name] Cerebrovascular disease, unspecified   . Other mechanical complication of other internal orthopedic device, implant, and graft   . Carotid artery disease     s/p LCEA 2007.  [name] Head and neck cancer     Receiving radiation therapy.  . Orthostatic  hypotension   . Ventricular tachycardia     a. S/p ICD implantation. b. Recurrent in 03/2012 with medication adjustment.    02/2012 - Cath Hemodynamics:  AO 120/46 (73)  LV 134/28  No gradient on pullback.  Coronary angiography:  Coronary dominance: right  Left mainstem: The LMCA is calcified and demonstrates on ostial stenosis of 80%. There is good flow into the  ramus, with no obstructive calcified plaque.  Left anterior descending (LAD): Demonstrates probably a significant lesion just beyond the diagonal bifurcation.  The SVG to the OM is widely patent with retrograde filling back to the LMCA  The LIMA to the LAD is widely patent with about 30% ostial narrowing. The flow distally is competitive.  Left circumflex (LCx): diffusely plaque with distal competitive filling  Right coronary artery (RCA): Diffusely ectatic proximally then total occlusion beyond a marginal branch.  The SVG to PDA is patent. There is tapered narrowing of less than 40% at its insertion to the PDA. There is brisk filling of the PDA retrograde into the PLA.  Left ventriculography: EF is reduced with global hypokinesis, and EF approximately 15%.  Final Conclusions:  1. Ischemic CM with severe global hypokinesis  2. Patent IMA to the LAD.  3. Patent SVG to the OM  4. Patent SVG to PDA with some mild insertion narrowing.  Recommendations:  1. Compare with prior films to review any changes.  2. Medical therapy.  3. Treatment of VT.   Surgical History:  Past Surgical History  Procedure Date  . Repeat otif left knee with figure-of-eight tension band.   . Implantation of a dual-chamber implantable 06/18/2007  . Left carotid endarterectomy   . Coronary artery bypass graft 04/04/2006     Home Meds: Medication Sig  acetaminophen (TYLENOL) 500 MG tablet Take 1,000 mg by mouth every 6 (six) hours as needed. For pain  ALDACTONE 25 MG tablet TAKE 1 TABLET ONCE DAILY.  budesonide (PULMICORT) 0.5 MG/2ML nebulizer solution Take 0.5 mg by nebulization 2 (two) times daily.  FLONASE 50 MCG/ACT nasal spray USE (2) SPRAYS IN EACH NOSTRIL ONCE DAILY.  formoterol (PERFOROMIST) 20 MCG/2ML nebulizer solution Take 20 mcg by nebulization 2 (two) times daily.  furosemide (LASIX) 40 MG tablet Take 60 mg by mouth daily.  loratadine (CLARITIN) 10 MG tablet Take 10 mg by mouth daily. For allergies    metoprolol succinate (TOPROL-XL) 50 MG 24 hr tablet Take 1 tablet (50 mg total) by mouth daily.  nitroGLYCERIN (NITROSTAT) 0.4 MG SL tablet Place 1 tablet (0.4 mg total) under the tongue every 5 (five) minutes as needed for chest pain.  omeprazole (PRILOSEC) 20 MG capsule Take 20 mg by mouth daily.  predniSONE (DELTASONE) 5 MG tablet Take 5 mg by mouth daily.   simvastatin (ZOCOR) 40 MG tablet Take 40 mg by mouth at bedtime.    sodium fluoride (FLUORISHIELD) 1.1 % GEL dental gel Place 1 application onto teeth at bedtime. Cal Dr. Everardo Beals in [place], [place] for future refills.  sotalol (BETAPACE) 120 MG tablet Take 1 tablet (120 mg total) by mouth 2 (two) times daily.  valsartan (DIOVAN) 80 MG tablet Take 40 mg by mouth daily.    warfarin (COUMADIN) 5 MG tablet Take 2.5-5 mg by mouth daily. Mondays and fridays take 5mg  and rest of days take half of the 5mg  tablet (2.5mg )    Allergies: No Known Allergies  History   Social History  . Marital Status: Married    Spouse Name: N/A  Number of Children: N/A  . Years of Education: N/A   Occupational History  . RETIRED    Social History Main Topics  . Smoking status: Former Smoker -- 2.0 packs/day for 60 years    Types: Cigarettes    Quit date: 08/06/1990  . Smokeless tobacco: Never Used  . Alcohol Use: No  . Drug Use: No  . Sexually Active: Not on file   Other Topics Concern  . Not on file   Social History Narrative   Pt does not get regular exercise.     Family history: Noncontributory  Review of Systems: General: negative for chills, fever, night sweats or weight changes.  Cardiovascular: (+) chronic shortness of breath, dyspnea on exertion; negative for chest pain, edema, orthopnea, palpitations, paroxysmal nocturnal dyspnea  Dermatological: negative for rash Respiratory: negative for cough or wheezing Urologic: negative for hematuria Abdominal: negative for nausea, vomiting, diarrhea, bright red blood per rectum, melena, or  hematemesis Neurologic: (+) dizziness and syncope All other systems reviewed and are otherwise negative except as noted above.  Labs:   Component Value Date   WBC 9.4 07/14/2012   HGB 13.9 07/14/2012   HCT 41.0 07/14/2012   MCV 88.5 07/14/2012   PLT 222 07/14/2012    07/14/2012 13:04  Prothrombin Time 21.1 (H)  INR 1.90 (H)   Lab 07/14/12 1319  NA 139  K 3.8  CL 102  CO2 --  BUN 16  CREATININE 1.00  GLUCOSE 109*  MAGNESIUM 2.3     07/14/2012 13:18  Troponin i, poc 0.08    Radiology/Studies:   07/14/2012 - PORTABLE CHEST - 1 VIEW  Findings: Mild cardiomegaly stable.  Changes of COPD are again seen.  No evidence of acute infiltrate or congestive heart failure. No evidence of pleural effusion.  AICD remains in appropriate position.  Prior CABG again noted.  IMPRESSION: Stable cardiomegaly and COPD.  No acute findings.     EKG: 07/14/12 @ 1240 - sinus rhythm A-paced 83bpm, QTc , no ST/T changes compared to prior EKG  Physical Exam: Blood pressure 119/76, pulse 75, temperature 97.9 F (36.6 C), temperature source Oral, resp. rate 17, SpO2 100.00%. General: Well developed, elderly white male in no acute distress. Head: Normocephalic, atraumatic, sclera non-icteric, nares are without discharge Neck: Supple. Negative for carotid bruits. JVD at 8cm Lungs: Clear bilaterally to auscultation without wheezes, rales, or rhonchi. Breathing is unlabored. Heart: RRR with decreased S1 at LLSB. 2/6 systolic murmur at apex. No rubs or gallops appreciated.PMI displaced LN neg Skin warm and dry Back without kyphois or scoliosis Abdomen: Soft, non-tender, non-distended with normoactive bowel sounds. No rebound/guarding. No obvious abdominal masses. Msk:  Strength and tone appear normal for age. Extremities: No edema. No clubbing or cyanosis. Distal pedal pulses are 2+ and equal bilaterally. Neuro: Alert and oriented X 3. Moves all extremities spontaneously. Psych:  Responds to questions  appropriately with a normal affect.    ASSESSMENT AND PLAN:  78 y.o. male w/ PMHx significant for CAD (s/p CABG '07), ICM (EF 15%, s/p ICD '08), VT (on sotalol), A.Fib (on coumadin), Carotid Artery Disease, HTN, and DMII who presented to [facility] on 07/14/2012 after being shocked by his ICD.   1. Ventricular Tachycardia w/ ICD shock 2. Chronic systolic HF due to Ischemic Cardiomyopathy, EF 15% 3. Coronary Artery Disease s/p CABG '07 and patent grafts by cath 03/2012 4. Paroxysmal Atrial Fibrillation on coumadin 5. Hypertension 6. Diabetes mellitus, Type 2  Patient presents with recurrent VT  and ICD shock. He has otherwise been feeling well and reports medication compliance. K+ minimally low at 3.8+, but otherwise labs ok. Poc troponin normal and EKG without ischemic changes. Interrogation reveals 23 episodes of VT over the last three weeks. Will await for Dr. Johney Page to decide on therapeutic ablation as this was discussed at his last office visit. For now will admit to ICU for further monitoring, start Mexiletine 200mg  BID, decrease Sotalol to 80mg  BID, continue other HF meds, and supplement K+. Continue coumadin. Cath 03/2012 showed patent grafts and he denies anginal symptoms. Will cycle CEs, although I suspect these will be elevated given ICD shock.    Signed, HOPE, JESSICA PA-C 07/14/2012, 3:34 PM  Pt with recurrent VT storm in setting of severe LV ischemic cardiomyopathy, previously intolerant of amio, Rx with solatoll despite EF 15 %  Has tolerated relatively and surprisingly well.  Unfortunately accelerating VT over recent weeks mostly but not all assoc with successful ATP; one episode required shock following acceleration    Have considered RFCA and thagt would be high risk.  For now will add mexilitene to lower dose of sotalol. Can alsouse quinidine, has been used both with mex and sotalol in the past, but would be reluctant to use with latter 2/2 QT prolongation  Have spoken  w/ DB and if BP would support might add low dose Hyd/ISDN in am

## 2012-07-14 NOTE — ED Notes (Signed)
Defib fired times four this am and had no symptoms before firing.  Pt is alert and no distress

## 2012-07-14 NOTE — Progress Notes (Signed)
ANTICOAGULATION CONSULT NOTE - Initial Consult  Pharmacy Consult for Coumadin Indication: atrial fibrillation  No Known Allergies  Vital Signs: Temp: 97.9 F (36.6 C) (12/09 1236) Temp src: Oral (12/09 1236) BP: 119/76 mmHg (12/09 1430) Pulse Rate: 75  (12/09 1430)  Labs:  Basename 07/14/12 1319 07/14/12 1304  HGB 13.9 13.8  HCT 41.0 40.8  PLT -- 222  APTT -- --  LABPROT -- 21.1*  INR -- 1.90*  HEPARINUNFRC -- --  CREATININE 1.00 --  CKTOTAL -- --  CKMB -- --  TROPONINI -- --    The CrCl is unknown because both a height and weight (above a minimum accepted value) are required for this calculation.   Medical History: Past Medical History  Diagnosis Date  . Hypertension   . Coronary artery disease     a. CABG 03/2006. b. NSTEMI 03/2012 following VT likely demand ischemia - grafts patent at cath.  . Diabetes mellitus   . Other specified forms of chronic ischemic heart disease   . Syncope and collapse   . Automatic implantable cardiac defibrillator in situ     QRS duration 120 ms no typical left bundle branch block.  . Atrial fibrillation     Taking sotalol, intolerant to amiodarone  . Valvular heart disease     Mild to moderate mitral valve insufficiency and mild aortic insufficiency  . Chronic airway obstruction, not elsewhere classified   . Old myocardial infarction   . Other and unspecified hyperlipidemia   . Congestive heart failure, unspecified     a. Ischemic cardiomyopathy ejection fraction 30-35% previously. b. Down to 15% by cath 03/2012.  [name] Cerebrovascular disease, unspecified   . Other mechanical complication of other internal orthopedic device, implant, and graft   . Carotid artery disease     s/p LCEA 2007.  [name] Head and neck cancer     Receiving radiation therapy.  . Orthostatic hypotension   . Ventricular tachycardia     a. S/p ICD implantation. b. Recurrent in 03/2012 with medication adjustment.    Medications:  Coumadin 2.5mg  daily except 5mg   mon/fri. See med rec for remaining.  Assessment: 77 y/o male patient admitted s/p ICD firing, on chronic coumadin for afib. INR subtherapeutic, has not had dose today.  Goal of Therapy:  INR 2-3 Monitor platelets by anticoagulation protocol: Yes   Plan:  Coumadin 5mg  today and f/u daily protime.  Verlene Mayer, PharmD, BCPS Pager [phone] 07/14/2012,3:37 PM

## 2012-07-14 NOTE — Telephone Encounter (Signed)
Wife called stating patient has had 3 defib shocks within the last hour.  2 small ones & one larger one.  States he is feeling fine now.    Confirmed with Gypsy Balsam, RN - patient to call 911 / ED for evaluation.   Wife notified of above.  Wife states she will take him to [facility].

## 2012-07-15 DIAGNOSIS — I2589 Other forms of chronic ischemic heart disease: Secondary | ICD-10-CM

## 2012-07-15 DIAGNOSIS — I2510 Atherosclerotic heart disease of native coronary artery without angina pectoris: Secondary | ICD-10-CM

## 2012-07-15 LAB — GLUCOSE, CAPILLARY
Glucose-Capillary: 92 mg/dL (ref 70–99)
Glucose-Capillary: 134 mg/dL — ABNORMAL HIGH (ref 70–99)

## 2012-07-15 LAB — BASIC METABOLIC PANEL
BUN: 13 mg/dL (ref 6–23)
Calcium: 9.5 mg/dL (ref 8.4–10.5)
GFR calc non Af Amer: 79 mL/min — ABNORMAL LOW (ref >90)
Glucose, Bld: 85 mg/dL (ref 70–99)
Sodium: 135 mEq/L (ref 135–145)

## 2012-07-15 LAB — TROPONIN I: Troponin I: <0.3 ng/mL (ref <0.30)

## 2012-07-15 LAB — CBC
HCT: 34.9 % — ABNORMAL LOW (ref 39.0–52.0)
Hemoglobin: 11.6 g/dL — ABNORMAL LOW (ref 13.0–17.0)
MCH: 29.1 pg (ref 26.0–34.0)
MCHC: 33.2 g/dL (ref 30.0–36.0)

## 2012-07-15 LAB — PROTIME-INR: INR: 2 — ABNORMAL HIGH (ref 0.00–1.49)

## 2012-07-15 MED ORDER — ALPRAZOLAM 0.25 MG PO TABS
0.2500 mg | ORAL_TABLET | Freq: Four times a day (QID) | ORAL | Status: DC | PRN
  Administered 2012-07-15: 0.25 mg via ORAL
  Filled 2012-07-15: qty 1

## 2012-07-15 MED ORDER — SOTALOL HCL 120 MG PO TABS
120.0000 mg | ORAL_TABLET | Freq: Two times a day (BID) | ORAL | Status: DC
  Administered 2012-07-15 – 2012-07-16 (×2): 120 mg via ORAL
  Filled 2012-07-15 (×4): qty 1

## 2012-07-15 MED ORDER — WARFARIN SODIUM 5 MG PO TABS
5.0000 mg | ORAL_TABLET | Freq: Once | ORAL | Status: AC
  Administered 2012-07-15: 5 mg via ORAL
  Filled 2012-07-15: qty 1

## 2012-07-15 NOTE — Progress Notes (Signed)
   ELECTROPHYSIOLOGY ROUNDING NOTE    Patient Name: [name] Date of Encounter: 07-15-2012    SUBJECTIVE:Patient feels well.  No chest pain or shortness of breath.  Admitted 07-14-2012 with recurrent VT and ICD shock.  Mexilitine added to Sotalol on admission.   TELEMETRY: Reviewed telemetry pt in sinus rhythm with occasional PVC's, occasional atrial pacing  Physical Exam: Filed Vitals:   07/15/12 0900 07/15/12 1000 07/15/12 1100 07/15/12 1200  BP: 116/61 115/59 100/50   Pulse: 74 73 74   Temp:    97.6 F (36.4 C)  TempSrc:      Resp: 10 23 22    Height:      Weight:      SpO2: 100% 95% 94%     GEN- The patient is well appearing, alert and oriented x 3 today.   Head- normocephalic, atraumatic Eyes-  Sclera clear, conjunctiva pink Ears- hearing intact Oropharynx- clear Neck- supple,  Lungs- Clear to ausculation bilaterally, normal work of breathing Chest- ICD pocket is well healed Heart- Regular rate and rhythm  GI- soft, NT, ND, + BS Extremities- no clubbing, cyanosis, or edema MS- no significant deformity or atrophy, s/p several finger amputations Skin- no rash or lesion Psych- euthymic mood, full affect Neuro- strength and sensation are intact  ICD interrogation- reviewed in detail today,  See PACEART report   LABS: Basic Metabolic Panel:  Basename 07/15/12 0300 07/14/12 1319 07/14/12 1304  NA 135 139 --  K 4.0 3.8 --  CL 101 102 --  CO2 23 -- --  GLUCOSE 85 109* --  BUN 13 16 --  CREATININE 0.86 1.00 --  CALCIUM 9.5 -- --  MG -- -- 2.3  PHOS -- -- --   CBC:  Basename 07/15/12 0300 07/14/12 1319 07/14/12 1304  WBC 7.1 -- 9.4  NEUTROABS -- -- 7.7  HGB 11.6* 13.9 --  HCT 34.9* 41.0 --  MCV 87.7 -- 88.5  PLT 187 -- 222   Cardiac Enzymes:  Basename 07/15/12 0306 07/14/12 2025  CKTOTAL -- --  CKMB -- --  CKMBINDEX -- --  TROPONINI <0.30 <0.30   Thyroid Function Tests:  Basename 07/14/12 2026  TSH 4.395  T4TOTAL --  T3FREE --    THYROIDAB --    Radiology/Studies:  Dg Chest Port 1 View 07/14/2012  *RADIOLOGY REPORT*  Clinical Data: AICD fired.  Coronary artery disease.  Diabetes. Valvular heart disease.  Head and neck cancer.  PORTABLE CHEST - 1 VIEW  Comparison: 03/05/2012  Findings: Mild cardiomegaly stable.  Changes of COPD are again seen.  No evidence of acute infiltrate or congestive heart failure. No evidence of pleural effusion.  AICD remains in appropriate position.  Prior CABG again noted.  IMPRESSION: Stable cardiomegaly and COPD.  No acute findings.   Original Report Authenticated By: Myles Rosenthal, M.D.    A/P: 1. VT-  The patient is admitted for recurrent VT (CL 370-370msec) with a single appropriate ICD shocks received.  Multiple other episodes were ATP terminated. He has been quiescent overnight with added mexilitine.  As he is stable, I will return sotalol to 120mg  bid at this time. He will transfer to telemetry.  I will follow his arrhythmia burden closely and consider ablation if he has further VT. Pt does not drive.  2. Ischemic CM- no ischemic symptoms or CHF on exam Continue current medicines  Transfer to telemetry Possibly discharge to home tomorrow  Hillis Range MD

## 2012-07-15 NOTE — Progress Notes (Addendum)
ANTICOAGULATION CONSULT NOTE -Follow UP  Pharmacy Consult for Coumadin Indication: atrial fibrillation  No Known Allergies  Vital Signs: Temp: 97.5 F (36.4 C) (12/10 0800) Temp src: Oral (12/10 0800) BP: 98/62 mmHg (12/10 0800) Pulse Rate: 74  (12/10 0800)  Labs:  Basename 07/15/12 0306 07/15/12 0300 07/14/12 2025 07/14/12 1319 07/14/12 1304  HGB -- 11.6* -- 13.9 --  HCT -- 34.9* -- 41.0 40.8  PLT -- 187 -- -- 222  APTT -- -- -- -- --  LABPROT -- 21.9* -- -- 21.1*  INR -- 2.00* -- -- 1.90*  HEPARINUNFRC -- -- -- -- --  CREATININE -- 0.86 -- 1.00 --  CKTOTAL -- -- -- -- --  CKMB -- -- -- -- --  TROPONINI <0.30 -- <0.30 -- --    Estimated Creatinine Clearance: 67.4 ml/min (by C-G formula based on Cr of 0.86).   Medical History: Past Medical History  Diagnosis Date  . Hypertension   . Coronary artery disease     a. CABG 03/2006. b. NSTEMI 03/2012 following VT likely demand ischemia - grafts patent at cath.  . Diabetes mellitus   . Other specified forms of chronic ischemic heart disease   . Syncope and collapse   . Automatic implantable cardiac defibrillator in situ     QRS duration 120 ms no typical left bundle branch block.  . Atrial fibrillation     Taking sotalol, intolerant to amiodarone  . Valvular heart disease     Mild to moderate mitral valve insufficiency and mild aortic insufficiency  . Chronic airway obstruction, not elsewhere classified   . Old myocardial infarction   . Other and unspecified hyperlipidemia   . Congestive heart failure, unspecified     a. Ischemic cardiomyopathy ejection fraction 30-35% previously. b. Down to 15% by cath 03/2012.  [name] Cerebrovascular disease, unspecified   . Other mechanical complication of other internal orthopedic device, implant, and graft   . Carotid artery disease     s/p LCEA 2007.  [name] Head and neck cancer     Receiving radiation therapy.  . Orthostatic hypotension   . Ventricular tachycardia     a. S/p ICD  implantation. b. Recurrent in 03/2012 with medication adjustment.   Assessment: 78 y/o male patient admitted s/p ICD firing, on chronic coumadin for afib. INR now therapeutic after his 5mg  dose last night.  INR is 2.0 this AM.  CBC is stable as well as his platelets and no noted bleeding with anticoagulation.  HOME dose:  Warfarin 5mg  on Mondays and Fridays and 2.5mg  all other days.  Drug/Drug Interactions:  Current medication list has been reviewed and no noted major drug interactions with Warfarin identified.  Goal of Therapy:  INR 2-3 Monitor platelets by anticoagulation protocol: Yes   Plan:   Coumadin 5mg  today and f/u daily protime.  Nadara Mustard, PharmD., MS Clinical Pharmacist Pager:  [phone] Thank you for allowing pharmacy to be part of this patients care team. 07/15/2012,9:33 AM

## 2012-07-15 NOTE — Progress Notes (Signed)
Patient transferred to room 3W20 via wheelchair, accompanied by daughter and wife. Pt's belongings (shoes, coat, T-shirt, and jeans), hospital toiletries, chart, and meds sent with pt to new room. Pt denies any complaints at this time. Report given to receiving nurse, Chisty, RN. Dawson Bills, RN

## 2012-07-16 DIAGNOSIS — I472 Ventricular tachycardia, unspecified: Secondary | ICD-10-CM | POA: Diagnosis present

## 2012-07-16 DIAGNOSIS — Z95810 Presence of automatic (implantable) cardiac defibrillator: Secondary | ICD-10-CM

## 2012-07-16 LAB — BASIC METABOLIC PANEL
BUN: 22 mg/dL (ref 6–23)
CO2: 25 mEq/L (ref 19–32)
Chloride: 98 mEq/L (ref 96–112)
Creatinine, Ser: 1.14 mg/dL (ref 0.50–1.35)
Glucose, Bld: 90 mg/dL (ref 70–99)

## 2012-07-16 LAB — GLUCOSE, CAPILLARY
Glucose-Capillary: 88 mg/dL (ref 70–99)
Glucose-Capillary: 89 mg/dL (ref 70–99)

## 2012-07-16 MED ORDER — WARFARIN SODIUM 5 MG PO TABS
5.0000 mg | ORAL_TABLET | Freq: Once | ORAL | Status: DC
  Filled 2012-07-16: qty 1

## 2012-07-16 MED ORDER — MEXILETINE HCL 200 MG PO CAPS: 200.0000 mg | ORAL_CAPSULE | Freq: Two times a day (BID) | ORAL | Status: DC

## 2012-07-16 NOTE — Progress Notes (Signed)
ANTICOAGULATION CONSULT NOTE -Follow UP  Pharmacy Consult for Coumadin Indication: atrial fibrillation  No Known Allergies  Vital Signs: Temp: 98 F (36.7 C) (12/11 0500) BP: 125/84 mmHg (12/11 0937) Pulse Rate: 74  (12/11 0937)  Labs:  Basename 07/16/12 0532 07/15/12 0920 07/15/12 0306 07/15/12 0300 07/14/12 2025 07/14/12 1319 07/14/12 1304  HGB -- -- -- 11.6* -- 13.9 --  HCT -- -- -- 34.9* -- 41.0 40.8  PLT -- -- -- 187 -- -- 222  APTT -- -- -- -- -- -- --  LABPROT 22.5* -- -- 21.9* -- -- 21.1*  INR 2.08* -- -- 2.00* -- -- 1.90*  HEPARINUNFRC -- -- -- -- -- -- --  CREATININE 1.14 -- -- 0.86 -- 1.00 --  CKTOTAL -- -- -- -- -- -- --  CKMB -- -- -- -- -- -- --  TROPONINI -- <0.30 <0.30 -- <0.30 -- --    Estimated Creatinine Clearance: 50.8 ml/min (by C-G formula based on Cr of 1.14).  Assessment: 78 y/o male Page admitted s/p ICD firing, on chronic coumadin for afib. INR remains therapeutic.  No noted bleeding.  HOME dose:  Warfarin 5mg  on Mondays and Fridays and 2.5mg  all other days.  INR slightly less than goal on admit - 1.9 on 12/9.  Drug/Drug Interactions:  Current medication list has been reviewed and no noted major drug interactions with Warfarin identified.  Goal of Therapy:  INR 2-3 Monitor platelets by anticoagulation protocol: Yes   Plan:  - Repeat Coumadin 5 mg today - Daily PT/INR  Jill Side L. Illene Bolus, PharmD, BCPS Clinical Pharmacist Pager: [phone] Pharmacy: [phone] 07/16/2012 10:17 AM

## 2012-07-16 NOTE — Discharge Summary (Signed)
CARDIOLOGY DISCHARGE SUMMARY   Patient ID: [name] MRN: [id number] DOB/AGE: 03/05/1929 78 y.o.  Admit date: 07/14/2012 Discharge date: 07/16/2012  Primary Discharge Diagnosis:  Principal Problem:  *Sustained VT (ventricular tachycardia)  Secondary Discharge Diagnosis:   ISCHEMIC CARDIOMYOPATHY  Procedures: ICD Interrogation  Hospital Course: [name] is an 78 year old male with a history of CAD and VT, s/p ICD. He had several episodes of syncope lasting a few seconds and the last one was followed by an ICD shock. He came to the ER, where interrogation of his device revealed multiple episodes of VT, treated with ATP and then the 1 shock. He was admitted for further evaluation and treatment.   It was felt the best option would be to add mexiletine to his sotalol. Initially, the sotalol was decreased, but his blood pressure tolerated both medications so the sotalol was increased to PTA levels. He was followed on telemetry, and had no further episodes of VT. By 07/16/2012, Dr Johney Frame felt he was stable for discharge, to follow up in the office.   His medications were reviewed and both the patient and his wife deny any reaction to any medication. To their knowledge, he tolerated amiodarone after his bypass surgery and stopped it because the surgeon told him he did not need it any more. (Notes reviewed from that time and no mention of intolerance to amiodarone mentioned).   Labs:  Lab Results  Component Value Date   WBC 7.1 07/15/2012   HGB 11.6* 07/15/2012   HCT 34.9* 07/15/2012   MCV 87.7 07/15/2012   PLT 187 07/15/2012     Lab 07/16/12 0532  NA 135  K 4.6  CL 98  CO2 25  BUN 22  CREATININE 1.14  CALCIUM 10.0  PROT --  BILITOT --  ALKPHOS --  ALT --  AST --  GLUCOSE 90    Basename 07/15/12 0920 07/15/12 0306 07/14/12 2025  CKTOTAL -- -- --  CKMB -- -- --  CKMBINDEX -- -- --  TROPONINI <0.30 <0.30 <0.30   Lipid Panel     Component Value Date/Time   CHOL  134 03/06/2012 0050   TRIG 176* 03/06/2012 0050   HDL 32* 03/06/2012 0050   CHOLHDL 4.2 03/06/2012 0050   VLDL 35 03/06/2012 0050   LDLCALC 67 03/06/2012 0050    Pro B Natriuretic peptide (BNP)  Date/Time Value Range Status  03/05/2012 10:35 PM 4955.0* 0 - 450 pg/mL Final    Basename 07/16/12 0532  INR 2.08*      Radiology: Dg Chest Port 1 View 07/14/2012  *RADIOLOGY REPORT*  Clinical Data: AICD fired.  Coronary artery disease.  Diabetes. Valvular heart disease.  Head and neck cancer.  PORTABLE CHEST - 1 VIEW  Comparison: 03/05/2012  Findings: Mild cardiomegaly stable.  Changes of COPD are again seen.  No evidence of acute infiltrate or congestive heart failure. No evidence of pleural effusion.  AICD remains in appropriate position.  Prior CABG again noted.  IMPRESSION: Stable cardiomegaly and COPD.  No acute findings.   Original Report Authenticated By: Myles Rosenthal, M.D.    EKG:16-Jul-2012 05:33:07 [facility] ROUTINE RECORD Normal sinus rhythm Normal ECG 69mm/s 4mm/mV 100Hz  8.0.1 12SL 239 CID: 21 Referred by: Sherryl Manges Unconfirmed Vent. rate 78 BPM PR interval 156 ms QRS duration 74 ms QT/QTc 384/437 ms P-R-T axes -17 15 31     FOLLOW UP PLANS AND APPOINTMENTS No Known Allergies   Medication List     As  of 07/16/2012  9:15 AM    TAKE these medications         acetaminophen 500 MG tablet   Commonly known as: TYLENOL   Take 1,000 mg by mouth every 6 (six) hours as needed. For pain      ALDACTONE 25 MG tablet   Generic drug: spironolactone   TAKE 1 TABLET ONCE DAILY.      budesonide 0.5 MG/2ML nebulizer solution   Commonly known as: PULMICORT   Take 0.5 mg by nebulization 2 (two) times daily.      FLONASE 50 MCG/ACT nasal spray   Generic drug: fluticasone   USE (2) SPRAYS IN EACH NOSTRIL ONCE DAILY.      formoterol 20 MCG/2ML nebulizer solution   Commonly known as: PERFOROMIST   Take 20 mcg by nebulization 2 (two) times daily.      furosemide 40 MG  tablet   Commonly known as: LASIX   Take 60 mg by mouth daily.      loratadine 10 MG tablet   Commonly known as: CLARITIN   Take 10 mg by mouth daily. For allergies      metoprolol succinate 50 MG 24 hr tablet   Commonly known as: TOPROL-XL   Take 1 tablet (50 mg total) by mouth daily.      mexiletine 200 MG capsule   Commonly known as: MEXITIL   Take 1 capsule (200 mg total) by mouth every 12 (twelve) hours.      nitroGLYCERIN 0.4 MG SL tablet   Commonly known as: NITROSTAT   Place 1 tablet (0.4 mg total) under the tongue every 5 (five) minutes as needed for chest pain.      omeprazole 20 MG capsule   Commonly known as: PRILOSEC   Take 20 mg by mouth daily.      predniSONE 5 MG tablet   Commonly known as: DELTASONE   Take 5 mg by mouth daily.      simvastatin 40 MG tablet   Commonly known as: ZOCOR   Take 40 mg by mouth at bedtime.      sodium fluoride 1.1 % Gel dental gel   Commonly known as: FLUORISHIELD   Place 1 application onto teeth at bedtime. Cal Dr. Everardo Beals in [place], [place] for future refills.      sotalol 120 MG tablet   Commonly known as: BETAPACE   Take 1 tablet (120 mg total) by mouth 2 (two) times daily.      valsartan 80 MG tablet   Commonly known as: DIOVAN   Take 40 mg by mouth daily.      warfarin 5 MG tablet   Commonly known as: COUMADIN   Take 2.5-5 mg by mouth daily. Mondays and fridays take 5mg  and rest of days take half of the 5mg  tablet (2.5mg )         Discharge Orders    Future Appointments: Provider: Department: Dept Phone: Center:   08/01/2012 10:30 AM Hillis Range, MD [facility] Main Office [place]) [phone] [email or web address]   08/18/2012 11:30 AM Hillis Range, MD [address] (near [place]) [phone] [email or web address]   08/26/2012 3:00 PM Rollene Rotunda, MD [address] (near [place]) [phone] [email or web address]     Follow-up Information    Follow up with Hillis Range, MD. On 08/01/2012. (at 10:30)    Contact  information:   [address] [place] [address] [phone]       Follow up with Rollene Rotunda, MD. On 08/26/2012. (  at 3:00 pm)    Contact information:   [address] [place], [place] [phone]         BRING ALL MEDICATIONS WITH YOU TO FOLLOW UP APPOINTMENTS  Time spent with patient to include physician time: 33 min Signed: Theodore Demark 07/16/2012, 9:15 AM Co-Sign MD  Hillis Range, MD

## 2012-07-16 NOTE — Progress Notes (Signed)
   ELECTROPHYSIOLOGY ROUNDING NOTE    Patient Name: [name] Date of Encounter: 07-16-2012    SUBJECTIVE:Patient feels well.  No chest pain or shortness of breath.  Admitted 12-9 with ICD shock, found to have recurrent VT mostly treated with ATP, 1 episode treated with HV therapy.  Mexilitine added to Sotalol for arrhythmia control.  Pt has had no further arrhythmias since being admitted.   TELEMETRY: Reviewed telemetry pt atrial pacing with intrinsic ventricular conduction Physical Exam: Filed Vitals:   07/15/12 2042 07/15/12 2100 07/16/12 0500 07/16/12 0832  BP:  102/66 94/60   Pulse:  72 73   Temp:  97.4 F (36.3 C) 98 F (36.7 C)   TempSrc:      Resp:  18 18   Height:      Weight:   156 lb 8 oz (70.988 kg)   SpO2: 98% 97% 98% 97%    GEN- The patient is well appearing, alert and oriented x 3 today.   Head- normocephalic, atraumatic Eyes-  Sclera clear, conjunctiva pink Ears- hearing intact Oropharynx- clear Neck- supple, no JVP Lymph- no cervical lymphadenopathy Lungs- Clear to ausculation bilaterally, normal work of breathing Chest- ICD pocket is well healed Heart- Regular rate and rhythm, no murmurs, rubs or gallops, PMI not laterally displaced GI- soft, NT, ND, + BS Extremities- no clubbing, cyanosis, or edema Skin- no rash or lesion Psych- euthymic mood, full affect Neuro- strength and sensation are intact  LABS: Basic Metabolic Panel:  Basename 07/16/12 0532 07/15/12 0300 07/14/12 1304  NA 135 135 --  K 4.6 4.0 --  CL 98 101 --  CO2 25 23 --  GLUCOSE 90 85 --  BUN 22 13 --  CREATININE 1.14 0.86 --  CALCIUM 10.0 9.5 --  MG 2.3 -- 2.3  PHOS -- -- --   CBC:  Basename 07/15/12 0300 07/14/12 1319 07/14/12 1304  WBC 7.1 -- 9.4  NEUTROABS -- -- 7.7  HGB 11.6* 13.9 --  HCT 34.9* 41.0 --  MCV 87.7 -- 88.5  PLT 187 -- 222   Cardiac Enzymes:  Basename 07/15/12 0920 07/15/12 0306 07/14/12 2025  CKTOTAL -- -- --  CKMB -- -- --  CKMBINDEX -- --  --  TROPONINI <0.30 <0.30 <0.30   Thyroid Function Tests:  [facility] 07/14/12 2026  TSH 4.395  T4TOTAL --  T3FREE --  THYROIDAB --   INR: 2.08    Assessment and Plan: 1. VT Doing well with mexiletine,  Qt stable with sotalol No driving x 6 months No changes at this time  2. Ischemic CM Stable without symptoms of ischemic or CHF No changes  Would recommend that he follow up with Dr Antoine Poche in [place] in 6-8 weeks  Follow up scheduled in 3-4 weeks with Dr Johney Frame to review VT burden in [place] office (patient aware will be in [place], not [place])

## 2012-08-01 ENCOUNTER — Encounter: Payer: Self-pay | Admitting: Internal Medicine

## 2012-08-01 ENCOUNTER — Ambulatory Visit (INDEPENDENT_AMBULATORY_CARE_PROVIDER_SITE_OTHER): Payer: Medicare Other | Admitting: Internal Medicine

## 2012-08-01 VITALS — BP 130/70 | HR 71 | Ht 69.0 in | Wt 163.0 lb

## 2012-08-01 DIAGNOSIS — I2589 Other forms of chronic ischemic heart disease: Secondary | ICD-10-CM

## 2012-08-01 DIAGNOSIS — Z95810 Presence of automatic (implantable) cardiac defibrillator: Secondary | ICD-10-CM

## 2012-08-01 DIAGNOSIS — I472 Ventricular tachycardia: Secondary | ICD-10-CM

## 2012-08-01 DIAGNOSIS — I4891 Unspecified atrial fibrillation: Secondary | ICD-10-CM

## 2012-08-01 DIAGNOSIS — I2510 Atherosclerotic heart disease of native coronary artery without angina pectoris: Secondary | ICD-10-CM

## 2012-08-01 LAB — ICD DEVICE OBSERVATION
AL THRESHOLD: 0.75 V
BATTERY VOLTAGE: 2.5869 V
DEVICE MODEL ICD: 454179
FVT: 0
HV IMPEDENCE: 50 Ohm
PACEART VT: 0
TOT-0006: 20081112000000
TOT-0009: 1
TOT-0010: 44
TZAT-0012SLOWVT: 200 ms
TZAT-0013FASTVT: 1
TZAT-0013SLOWVT: 3
TZAT-0018FASTVT: NEGATIVE
TZAT-0019SLOWVT: 7.5 V
TZAT-0020FASTVT: 1 ms
TZAT-0020SLOWVT: 1 ms
TZON-0004SLOWVT: 30
TZON-0005FASTVT: 6
TZON-0005SLOWVT: 6
TZST-0001FASTVT: 2
TZST-0001FASTVT: 4
TZST-0001SLOWVT: 3
TZST-0001SLOWVT: 4
TZST-0003FASTVT: 36 J
TZST-0003FASTVT: 36 J
TZST-0003SLOWVT: 36 J
VF: 0

## 2012-08-01 LAB — BASIC METABOLIC PANEL
CO2: 24 mEq/L (ref 19–32)
Calcium: 9.3 mg/dL (ref 8.4–10.5)
Chloride: 98 mEq/L (ref 96–112)
Glucose, Bld: 90 mg/dL (ref 70–99)
Sodium: 133 mEq/L — ABNORMAL LOW (ref 135–145)

## 2012-08-01 NOTE — Patient Instructions (Signed)
Your physician recommends that you schedule a follow-up appointment in: 3 months in [place] with Dr Allred  

## 2012-08-01 NOTE — Progress Notes (Signed)
PCP: Louie Boston, MD Primary Cardiologist:  Dr Rhea Belton [name] is a 78 y.o. male who presents today for routine electrophysiology followup.  Since his hospital discharge, the patient reports doing very well.  He has had no symptoms of arrhythmia.  He is tolerating medicines without difficulty.  He has occasional orthopnea (2 pillow).  Today, he denies symptoms of palpitations, chest pain, shortness of breath,  lower extremity edema, dizziness, presyncope, syncope, or ICD shocks.  The patient is otherwise without complaint today.   Past Medical History  Diagnosis Date  . Hypertension   . Coronary artery disease     a. CABG 03/2006. b. NSTEMI 03/2012 following VT likely demand ischemia - grafts patent at cath.  . Diabetes mellitus   . Other specified forms of chronic ischemic heart disease   . Syncope and collapse   . Automatic implantable cardiac defibrillator in situ     QRS duration 120 ms no typical left bundle branch block.  . Atrial fibrillation     Taking sotalol, intolerant to amiodarone  . Valvular heart disease     Mild to moderate mitral valve insufficiency and mild aortic insufficiency  . Chronic airway obstruction, not elsewhere classified   . Old myocardial infarction   . Other and unspecified hyperlipidemia   . Congestive heart failure, unspecified     a. Ischemic cardiomyopathy ejection fraction 30-35% previously. b. Down to 15% by cath 03/2012.  [name] Cerebrovascular disease, unspecified   . Other mechanical complication of other internal orthopedic device, implant, and graft   . Carotid artery disease     s/p LCEA 2007.  [name] Head and neck cancer     Receiving radiation therapy.  . Orthostatic hypotension   . Ventricular tachycardia     a. S/p ICD implantation. b. Recurrent in 03/2012 with medication adjustment.   Past Surgical History  Procedure Date  . Repeat otif left knee with figure-of-eight tension band.   . Implantation of a dual-chamber implantable  06/18/2007  . Left carotid endarterectomy   . Coronary artery bypass graft 04/04/2006    Current Outpatient Prescriptions  Medication Sig Dispense Refill  . acetaminophen (TYLENOL) 500 MG tablet Take 1,000 mg by mouth every 6 (six) hours as needed. For pain      . ALDACTONE 25 MG tablet TAKE 1 TABLET ONCE DAILY.  30 each  6  . budesonide (PULMICORT) 0.5 MG/2ML nebulizer solution Take 0.5 mg by nebulization 2 (two) times daily.      [name] FLONASE 50 MCG/ACT nasal spray USE (2) SPRAYS IN EACH NOSTRIL ONCE DAILY.  16 g  2  . formoterol (PERFOROMIST) 20 MCG/2ML nebulizer solution Take 20 mcg by nebulization 2 (two) times daily.      . furosemide (LASIX) 40 MG tablet Take 60 mg by mouth daily.      [name] loratadine (CLARITIN) 10 MG tablet Take 10 mg by mouth daily. For allergies      . metoprolol succinate (TOPROL-XL) 50 MG 24 hr tablet Take 1 tablet (50 mg total) by mouth daily.  30 tablet  6  . mexiletine (MEXITIL) 200 MG capsule Take 1 capsule (200 mg total) by mouth every 12 (twelve) hours.  60 capsule  11  . nitroGLYCERIN (NITROSTAT) 0.4 MG SL tablet Place 1 tablet (0.4 mg total) under the tongue every 5 (five) minutes as needed for chest pain.  25 each  3  . omeprazole (PRILOSEC) 20 MG capsule Take 20 mg by mouth daily.      [name]  predniSONE (DELTASONE) 5 MG tablet Take 5 mg by mouth daily.       . simvastatin (ZOCOR) 40 MG tablet Take 40 mg by mouth at bedtime.        . sodium fluoride (FLUORISHIELD) 1.1 % GEL dental gel Place 1 application onto teeth at bedtime. Cal Dr. Everardo Beals in [place], [place] for future refills.  120 mL  0  . sotalol (BETAPACE) 120 MG tablet Take 1 tablet (120 mg total) by mouth 2 (two) times daily.  60 tablet  3  . valsartan (DIOVAN) 80 MG tablet Take 40 mg by mouth daily.        [name] warfarin (COUMADIN) 5 MG tablet Take 2.5-5 mg by mouth daily. Mondays and fridays take 5mg  and rest of days take half of the 5mg  tablet (2.5mg )      . [DISCONTINUED] warfarin (COUMADIN) 5 MG tablet Take 1  tablet (5 mg total) by mouth daily.  30 tablet  3    Physical Exam: Filed Vitals:   08/01/12 1054  BP: 130/70  Pulse: 71  Height: 5\' 9"  (1.753 m)  Weight: 163 lb (73.936 kg)    GEN- The patient is well appearing, alert and oriented x 3 today.   Head- normocephalic, atraumatic Eyes-  Sclera clear, conjunctiva pink Ears- hearing intact Oropharynx- clear Lungs- Clear to ausculation bilaterally, normal work of breathing Chest- ICD pocket is well healed Heart- Regular rate and rhythm, no murmurs, rubs or gallops, PMI not laterally displaced GI- soft, NT, ND, + BS Extremities- no clubbing, cyanosis, or edema  ICD interrogation- reviewed in detail today,  See PACEART report  Assessment and Plan:  1.  Chronic systolic dysfunction euvolemic today Stable on an appropriate medical regimen  2. VT  Normal ICD function No arrhythmias since hospital discharge See [facility] report No changes today bmet and Mg today No driving x 6 months  Return to see me in 3 months in [place]

## 2012-08-05 ENCOUNTER — Encounter: Payer: Self-pay | Admitting: Cardiology

## 2012-08-05 ENCOUNTER — Ambulatory Visit (INDEPENDENT_AMBULATORY_CARE_PROVIDER_SITE_OTHER): Payer: Medicare Other | Admitting: *Deleted

## 2012-08-05 DIAGNOSIS — Z7901 Long term (current) use of anticoagulants: Secondary | ICD-10-CM

## 2012-08-05 DIAGNOSIS — I4891 Unspecified atrial fibrillation: Secondary | ICD-10-CM

## 2012-08-08 ENCOUNTER — Ambulatory Visit (INDEPENDENT_AMBULATORY_CARE_PROVIDER_SITE_OTHER): Payer: Medicare Other | Admitting: Cardiology

## 2012-08-08 ENCOUNTER — Encounter: Payer: Self-pay | Admitting: Cardiology

## 2012-08-08 VITALS — BP 114/70 | HR 92 | Ht 69.0 in | Wt 164.0 lb

## 2012-08-08 DIAGNOSIS — Z95810 Presence of automatic (implantable) cardiac defibrillator: Secondary | ICD-10-CM

## 2012-08-08 DIAGNOSIS — I472 Ventricular tachycardia: Secondary | ICD-10-CM

## 2012-08-08 DIAGNOSIS — I4891 Unspecified atrial fibrillation: Secondary | ICD-10-CM

## 2012-08-08 DIAGNOSIS — I2510 Atherosclerotic heart disease of native coronary artery without angina pectoris: Secondary | ICD-10-CM

## 2012-08-08 DIAGNOSIS — I951 Orthostatic hypotension: Secondary | ICD-10-CM

## 2012-08-08 DIAGNOSIS — I2589 Other forms of chronic ischemic heart disease: Secondary | ICD-10-CM

## 2012-08-08 DIAGNOSIS — I779 Disorder of arteries and arterioles, unspecified: Secondary | ICD-10-CM

## 2012-08-08 NOTE — Patient Instructions (Addendum)
Your physician recommends that you schedule a follow-up appointment in: 3 months. Your physician recommends that you continue on your current medications as directed. Please refer to the Current Medication list given to you today.  Your physician recommends that you return for lab work today at [facility] for BMET.

## 2012-08-08 NOTE — Progress Notes (Signed)
HPI The patient presents for followup of his ischemic cardiomyopathy and ventricular tachycardia. Since I last saw him he was hospitalized with recurrent V. tach and firing of his defibrillator. He was followed by Dr. Johney Frame and mexiletine was and to his regimen. He saw Dr. Johney Frame back in followup in the office and was doing well. Since that time he has had one more firing of his defibrillator. However, because he had no symptoms prior to or after were he did not seek any further medical attention. He has had no further tachypalpitations. He has had no presyncope or syncope. He has had no chest pressure, neck or arm discomfort. He has had no weight gain or edema and he watches this closely. His biggest issue has been fatigue. Some days he feels well but on multiple days he simply feels tired.  No Known Allergies  Current Outpatient Prescriptions  Medication Sig Dispense Refill  . acetaminophen (TYLENOL) 500 MG tablet Take 1,000 mg by mouth every 6 (six) hours as needed. For pain      . ALDACTONE 25 MG tablet TAKE 1 TABLET ONCE DAILY.  30 each  6  . budesonide (PULMICORT) 0.5 MG/2ML nebulizer solution Take 0.5 mg by nebulization 2 (two) times daily.      [name] FLONASE 50 MCG/ACT nasal spray USE (2) SPRAYS IN EACH NOSTRIL ONCE DAILY.  16 g  2  . formoterol (PERFOROMIST) 20 MCG/2ML nebulizer solution Take 20 mcg by nebulization 2 (two) times daily.      . furosemide (LASIX) 40 MG tablet Take 60 mg by mouth daily.      [name] loratadine (CLARITIN) 10 MG tablet Take 10 mg by mouth daily. For allergies      . metoprolol succinate (TOPROL-XL) 50 MG 24 hr tablet Take 1 tablet (50 mg total) by mouth daily.  30 tablet  6  . mexiletine (MEXITIL) 200 MG capsule Take 1 capsule (200 mg total) by mouth every 12 (twelve) hours.  60 capsule  11  . nitroGLYCERIN (NITROSTAT) 0.4 MG SL tablet Place 1 tablet (0.4 mg total) under the tongue every 5 (five) minutes as needed for chest pain.  25 each  3  . omeprazole (PRILOSEC)  20 MG capsule Take 20 mg by mouth daily.      . predniSONE (DELTASONE) 5 MG tablet Take 5 mg by mouth daily.       . simvastatin (ZOCOR) 40 MG tablet Take 40 mg by mouth at bedtime.        . sodium fluoride (FLUORISHIELD) 1.1 % GEL dental gel Place 1 application onto teeth at bedtime. Cal Dr. Everardo Beals in [place], [place] for future refills.  120 mL  0  . sotalol (BETAPACE) 120 MG tablet Take 1 tablet (120 mg total) by mouth 2 (two) times daily.  60 tablet  3  . valsartan (DIOVAN) 80 MG tablet Take 40 mg by mouth daily.        [name] warfarin (COUMADIN) 5 MG tablet Take 2.5-5 mg by mouth daily. Mondays and fridays take 5mg  and rest of days take half of the 5mg  tablet (2.5mg )      . [DISCONTINUED] warfarin (COUMADIN) 5 MG tablet Take 1 tablet (5 mg total) by mouth daily.  30 tablet  3    Past Medical History  Diagnosis Date  . Hypertension   . Coronary artery disease     a. CABG 03/2006. b. NSTEMI 03/2012 following VT likely demand ischemia - grafts patent at cath.  . Diabetes  mellitus   . Other specified forms of chronic ischemic heart disease   . Syncope and collapse   . Automatic implantable cardiac defibrillator in situ     QRS duration 120 ms no typical left bundle branch block.  . Atrial fibrillation     Taking sotalol, intolerant to amiodarone  . Valvular heart disease     Mild to moderate mitral valve insufficiency and mild aortic insufficiency  . Chronic airway obstruction, not elsewhere classified   . Old myocardial infarction   . Other and unspecified hyperlipidemia   . Congestive heart failure, unspecified     a. Ischemic cardiomyopathy ejection fraction 30-35% previously. b. Down to 15% by cath 03/2012.  [name] Cerebrovascular disease, unspecified   . Other mechanical complication of other internal orthopedic device, implant, and graft   . Carotid artery disease     s/p LCEA 2007.  [name] Head and neck cancer     Receiving radiation therapy.  . Orthostatic hypotension   . Ventricular tachycardia       a. S/p ICD implantation. b. Recurrent in 03/2012 with medication adjustment.    Past Surgical History  Procedure Date  . Repeat otif left knee with figure-of-eight tension band.   . Implantation of a dual-chamber implantable 06/18/2007  . Left carotid endarterectomy   . Coronary artery bypass graft 04/04/2006    ROS:  As stated in the HPI and negative for all other systems.  PHYSICAL EXAM BP 114/70  Pulse 92  Ht 5\' 9"  (1.753 m)  Wt 164 lb (74.39 kg)  BMI 24.22 kg/m2 GENERAL:  Well appearing HEENT:  Pupils equal round and reactive, fundi not visualized, oral mucosa unremarkable NECK:  No jugular venous distention, waveform within normal limits, carotid upstroke brisk and symmetric, no bruits, no thyromegaly LUNGS:  Clear to auscultation bilaterally BACK:  No CVA tenderness CHEST:  Well healed ICD pocket HEART:  PMI not displaced or sustained,S1 and S2 within normal limits, no S3,  no clicks, no rubs, no murmurs, irregular ABD:  Flat, positive bowel sounds normal in frequency in pitch, no bruits, no rebound, no guarding, no midline pulsatile mass, no hepatomegaly, no splenomegaly EXT:  2 plus pulses throughout, no edema, no cyanosis no clubbing, missing the fingers on the right hand  EKG:  Atrial fibrillation, rate 75, axis within normal limits, intervals within normal limits, no acute ST-T wave changes.  ASSESSMENT AND PLAN  Coronary artery disease  The patient has no new sypmtoms.  No further cardiovascular testing is indicated.  We will continue with aggressive risk reduction and meds as listed.  ISCHEMIC CARDIOMYOPATHY  He seems to be euvolemic.  At this point, no change in therapy is indicated.  We have reviewed salt and fluid restrictions.  Atrial fibrillation  He tolerates this rhythm with rate control and anticoagulation. No change in therapy is planned.  VENTRICULAR TACHYCARDIA  The patient has had no further. No further workup is planned. He will continue the meds  as listed.  We did discuss EP study for V. tach ablation which was suggested at one point by Dr. Johney Frame.  He would consider this and will discuss it at his upcoming appointment.  Fatigue This is his biggest complaint. I do note that his sodium was low the other day. I will check a basic metabolic profile. Otherwise I suspect this is multifactorial.  I did review labs and his recent hospitalization and he was not anemic and his TSH was normal.

## 2012-08-18 ENCOUNTER — Encounter: Payer: Self-pay | Admitting: Internal Medicine

## 2012-08-20 ENCOUNTER — Other Ambulatory Visit: Payer: Self-pay | Admitting: Cardiology

## 2012-08-26 ENCOUNTER — Encounter: Payer: Self-pay | Admitting: Cardiology

## 2012-09-03 ENCOUNTER — Encounter: Payer: Self-pay | Admitting: Internal Medicine

## 2012-09-15 ENCOUNTER — Ambulatory Visit (INDEPENDENT_AMBULATORY_CARE_PROVIDER_SITE_OTHER): Payer: Medicare Other | Admitting: Internal Medicine

## 2012-09-15 ENCOUNTER — Encounter: Payer: Self-pay | Admitting: Internal Medicine

## 2012-09-15 VITALS — BP 128/79 | HR 73 | Ht 69.0 in | Wt 162.0 lb

## 2012-09-15 DIAGNOSIS — I472 Ventricular tachycardia: Secondary | ICD-10-CM

## 2012-09-15 DIAGNOSIS — I2589 Other forms of chronic ischemic heart disease: Secondary | ICD-10-CM

## 2012-09-15 DIAGNOSIS — I4891 Unspecified atrial fibrillation: Secondary | ICD-10-CM

## 2012-09-15 LAB — ICD DEVICE OBSERVATION
AL AMPLITUDE: 3.7 mv
AL THRESHOLD: 1 V
BATTERY VOLTAGE: 2.5718 V
DEVICE MODEL ICD: 454179
HV IMPEDENCE: 48 Ohm
MODE SWITCH EPISODES: 1
PACEART VT: 0
RV LEAD THRESHOLD: 1.25 V
TOT-0006: 20081112000000
TOT-0007: 1
TOT-0009: 1
TOT-0010: 46
TZAT-0012SLOWVT: 200 ms
TZAT-0013FASTVT: 1
TZAT-0013SLOWVT: 3
TZAT-0018FASTVT: NEGATIVE
TZAT-0018SLOWVT: NEGATIVE
TZAT-0020FASTVT: 1 ms
TZAT-0020SLOWVT: 1 ms
TZON-0003FASTVT: 330 ms
TZON-0003SLOWVT: 400 ms
TZON-0004SLOWVT: 30
TZON-0005FASTVT: 6
TZON-0005SLOWVT: 6
TZON-0010FASTVT: 80 ms
TZST-0001FASTVT: 2
TZST-0001FASTVT: 4
TZST-0001SLOWVT: 4
TZST-0003FASTVT: 36 J
TZST-0003FASTVT: 36 J
TZST-0003SLOWVT: 36 J
VF: 1

## 2012-09-15 NOTE — Patient Instructions (Addendum)
Continue all current medications. 3 months - Allred

## 2012-09-15 NOTE — Progress Notes (Signed)
PCP: Louie Boston, MD Primary Cardiologist:  Dr Rhea Belton KAYDIN KARBOWSKI is a 79 y.o. male who presents today for routine electrophysiology followup. He appears to be doing well at this time.  Today, he denies symptoms of palpitations, chest pain, shortness of breath,  lower extremity edema, dizziness, presyncope, syncope, or ICD shocks.  The patient is otherwise without complaint today.   Past Medical History  Diagnosis Date  . Hypertension   . Coronary artery disease     a. CABG 03/2006. b. NSTEMI 03/2012 following VT likely demand ischemia - grafts patent at cath.  . Diabetes mellitus   . Other specified forms of chronic ischemic heart disease   . Syncope and collapse   . Automatic implantable cardiac defibrillator in situ     QRS duration 120 ms no typical left bundle branch block.  . Atrial fibrillation     Taking sotalol, intolerant to amiodarone  . Valvular heart disease     Mild to moderate mitral valve insufficiency and mild aortic insufficiency  . Chronic airway obstruction, not elsewhere classified   . Old myocardial infarction   . Other and unspecified hyperlipidemia   . Congestive heart failure, unspecified     a. Ischemic cardiomyopathy ejection fraction 30-35% previously. b. Down to 15% by cath 03/2012.  [name] Cerebrovascular disease, unspecified   . Other mechanical complication of other internal orthopedic device, implant, and graft   . Carotid artery disease     s/p LCEA 2007.  [name] Head and neck cancer     Receiving radiation therapy.  . Orthostatic hypotension   . Ventricular tachycardia     a. S/p ICD implantation. b. Recurrent in 03/2012 with medication adjustment.   Past Surgical History  Procedure Laterality Date  . Repeat otif left knee with figure-of-eight tension band.    . Implantation of a dual-chamber implantable  06/18/2007  . Left carotid endarterectomy    . Coronary artery bypass graft  04/04/2006    Current Outpatient Prescriptions  Medication Sig  Dispense Refill  . acetaminophen (TYLENOL) 500 MG tablet Take 1,000 mg by mouth every 6 (six) hours as needed. For pain      . ALDACTONE 25 MG tablet TAKE 1 TABLET ONCE DAILY.  30 each  6  . budesonide (PULMICORT) 0.5 MG/2ML nebulizer solution Take 0.5 mg by nebulization 2 (two) times daily.      [name] FLONASE 50 MCG/ACT nasal spray USE (2) SPRAYS IN EACH NOSTRIL ONCE DAILY.  16 g  2  . formoterol (PERFOROMIST) 20 MCG/2ML nebulizer solution Take 20 mcg by nebulization 2 (two) times daily.      . furosemide (LASIX) 40 MG tablet TAKE 1 & 1/2 TABLET DAILY.  45 tablet  2  . loratadine (CLARITIN) 10 MG tablet Take 10 mg by mouth daily. For allergies      . megestrol (MEGACE) 40 MG/ML suspension Take by mouth as needed (take 10 mL as needed.).       [name] metoprolol succinate (TOPROL-XL) 50 MG 24 hr tablet Take 1 tablet (50 mg total) by mouth daily.  30 tablet  6  . mexiletine (MEXITIL) 200 MG capsule Take 1 capsule (200 mg total) by mouth every 12 (twelve) hours.  60 capsule  11  . nitroGLYCERIN (NITROSTAT) 0.4 MG SL tablet Place 1 tablet (0.4 mg total) under the tongue every 5 (five) minutes as needed for chest pain.  25 each  3  . omeprazole (PRILOSEC) 20 MG capsule Take 20 mg by  mouth daily.      . predniSONE (DELTASONE) 5 MG tablet Take 5 mg by mouth daily.       . simvastatin (ZOCOR) 40 MG tablet Take 40 mg by mouth at bedtime.        . sodium fluoride (FLUORISHIELD) 1.1 % GEL dental gel Place 1 application onto teeth at bedtime. Cal Dr. Everardo Beals in [place], [place] for future refills.  120 mL  0  . sotalol (BETAPACE) 120 MG tablet Take 1 tablet (120 mg total) by mouth 2 (two) times daily.  60 tablet  3  . valsartan (DIOVAN) 80 MG tablet Take 40 mg by mouth daily.        [name] warfarin (COUMADIN) 5 MG tablet Take 2.5-5 mg by mouth daily. Mondays and fridays take 5mg  and rest of days take half of the 5mg  tablet (2.5mg )      . [DISCONTINUED] warfarin (COUMADIN) 5 MG tablet Take 1 tablet (5 mg total) by mouth daily.  30  tablet  3   No current facility-administered medications for this visit.    Physical Exam: Filed Vitals:   09/15/12 1342  BP: 128/79  Pulse: 73  Height: 5\' 9"  (1.753 m)  Weight: 162 lb (73.483 kg)    GEN- The patient is well appearing, alert and oriented x 3 today.   Head- normocephalic, atraumatic Eyes-  Sclera clear, conjunctiva pink Ears- hearing intact Oropharynx- clear Lungs- Clear to ausculation bilaterally, normal work of breathing Chest- ICD pocket is well healed Heart- Regular rate and rhythm, no murmurs, rubs or gallops, PMI not laterally displaced GI- soft, NT, ND, + BS Extremities- no clubbing, cyanosis, or edema  ICD interrogation- reviewed in detail today,  See PACEART report  Assessment and Plan:  1.  Chronic systolic dysfunction euvolemic today Stable on an appropriate medical regimen  2. VT  Normal ICD function 1 episode of VT 09/06/11 (CL 300-305 msec) successfully converted with a single 25J shock See Pace Art report No changes today No driving x 6 months again reinforced today  Return to see me in 3 months in [place]

## 2012-09-16 ENCOUNTER — Ambulatory Visit (INDEPENDENT_AMBULATORY_CARE_PROVIDER_SITE_OTHER): Payer: Medicare Other | Admitting: *Deleted

## 2012-09-16 DIAGNOSIS — Z7901 Long term (current) use of anticoagulants: Secondary | ICD-10-CM

## 2012-09-16 DIAGNOSIS — I4891 Unspecified atrial fibrillation: Secondary | ICD-10-CM

## 2012-09-16 LAB — POCT INR: INR: 2

## 2012-10-22 ENCOUNTER — Encounter: Payer: Self-pay | Admitting: Internal Medicine

## 2012-10-22 ENCOUNTER — Ambulatory Visit (INDEPENDENT_AMBULATORY_CARE_PROVIDER_SITE_OTHER): Payer: Medicare Other | Admitting: Internal Medicine

## 2012-10-22 VITALS — BP 120/77 | HR 73 | Ht 69.0 in | Wt 154.0 lb

## 2012-10-22 DIAGNOSIS — I4891 Unspecified atrial fibrillation: Secondary | ICD-10-CM

## 2012-10-22 DIAGNOSIS — I2589 Other forms of chronic ischemic heart disease: Secondary | ICD-10-CM

## 2012-10-22 DIAGNOSIS — I472 Ventricular tachycardia: Secondary | ICD-10-CM

## 2012-10-22 LAB — ICD DEVICE OBSERVATION
AL AMPLITUDE: 3.3 mv
AL THRESHOLD: 1 V
BATTERY VOLTAGE: 2.5718 V
CHARGE TIME: 13.9 s
DEV-0020ICD: NEGATIVE
MODE SWITCH EPISODES: 0
PACEART VT: 0
RV LEAD THRESHOLD: 1.25 V
TOT-0010: 50
TZAT-0001FASTVT: 1
TZAT-0013SLOWVT: 3
TZAT-0018SLOWVT: NEGATIVE
TZAT-0019SLOWVT: 7.5 V
TZAT-0020SLOWVT: 1 ms
TZON-0005SLOWVT: 6
TZON-0010FASTVT: 80 ms
TZST-0001FASTVT: 2
TZST-0001SLOWVT: 2
TZST-0001SLOWVT: 4
TZST-0003FASTVT: 36 J
TZST-0003SLOWVT: 36 J
VF: 2

## 2012-10-22 NOTE — Progress Notes (Signed)
PCP: Louie Boston, MD Primary Cardiologist:  Dr Rhea Belton [name] is a 79 y.o. male who presents today for routine electrophysiology followup. He appears to be doing well at this time.  He presented to [facility] 10/15/12 with ICD shocks.  K was 2.9 at that time.  His K was repleted.  He has had no further shocks since that time.  Today, he denies symptoms of palpitations, chest pain, shortness of breath,  lower extremity edema, dizziness, presyncope, or syncope.  The patient is otherwise without complaint today.   Past Medical History  Diagnosis Date  . Hypertension   . Coronary artery disease     a. CABG 03/2006. b. NSTEMI 03/2012 following VT likely demand ischemia - grafts patent at cath.  . Diabetes mellitus   . Other specified forms of chronic ischemic heart disease   . Syncope and collapse   . Automatic implantable cardiac defibrillator in situ     QRS duration 120 ms no typical left bundle branch block.  . Atrial fibrillation     Taking sotalol, intolerant to amiodarone  . Valvular heart disease     Mild to moderate mitral valve insufficiency and mild aortic insufficiency  . Chronic airway obstruction, not elsewhere classified   . Old myocardial infarction   . Other and unspecified hyperlipidemia   . Congestive heart failure, unspecified     a. Ischemic cardiomyopathy ejection fraction 30-35% previously. b. Down to 15% by cath 03/2012.  [name] Cerebrovascular disease, unspecified   . Other mechanical complication of other internal orthopedic device, implant, and graft   . Carotid artery disease     s/p LCEA 2007.  [name] Head and neck cancer     Receiving radiation therapy.  . Orthostatic hypotension   . Ventricular tachycardia     a. S/p ICD implantation. b. Recurrent in 03/2012 with medication adjustment.   Past Surgical History  Procedure Laterality Date  . Repeat otif left knee with figure-of-eight tension band.    . Implantation of a dual-chamber implantable   06/18/2007  . Left carotid endarterectomy    . Coronary artery bypass graft  04/04/2006    Current Outpatient Prescriptions  Medication Sig Dispense Refill  . acetaminophen (TYLENOL) 500 MG tablet Take 1,000 mg by mouth every 6 (six) hours as needed. For pain      . ALDACTONE 25 MG tablet TAKE 1 TABLET ONCE DAILY.  30 each  6  . budesonide (PULMICORT) 0.5 MG/2ML nebulizer solution Take 0.5 mg by nebulization 2 (two) times daily.      [name] FLONASE 50 MCG/ACT nasal spray USE (2) SPRAYS IN EACH NOSTRIL ONCE DAILY.  16 g  2  . formoterol (PERFOROMIST) 20 MCG/2ML nebulizer solution Take 20 mcg by nebulization 2 (two) times daily.      . furosemide (LASIX) 40 MG tablet TAKE 1 & 1/2 TABLET DAILY.  45 tablet  2  . loratadine (CLARITIN) 10 MG tablet Take 10 mg by mouth daily. For allergies      . megestrol (MEGACE) 40 MG/ML suspension Take by mouth as needed (take 10 mL as needed.).       [name] metoprolol succinate (TOPROL-XL) 50 MG 24 hr tablet Take 1 tablet (50 mg total) by mouth daily.  30 tablet  6  . mexiletine (MEXITIL) 200 MG capsule Take 1 capsule (200 mg total) by mouth every 12 (twelve) hours.  60 capsule  11  . nitroGLYCERIN (NITROSTAT) 0.4 MG SL tablet Place 1 tablet (0.4  mg total) under the tongue every 5 (five) minutes as needed for chest pain.  25 each  3  . omeprazole (PRILOSEC) 20 MG capsule Take 20 mg by mouth daily.      . potassium chloride (K-DUR) 10 MEQ tablet Take 10 mEq by mouth 2 (two) times daily.       . predniSONE (DELTASONE) 5 MG tablet Take 5 mg by mouth daily.       . simvastatin (ZOCOR) 40 MG tablet Take 40 mg by mouth at bedtime.        . sodium fluoride (FLUORISHIELD) 1.1 % GEL dental gel Place 1 application onto teeth at bedtime. Cal Dr. Everardo Beals in [place], [place] for future refills.  120 mL  0  . sotalol (BETAPACE) 120 MG tablet Take 1 tablet (120 mg total) by mouth 2 (two) times daily.  60 tablet  3  . valsartan (DIOVAN) 80 MG tablet Take 40 mg by mouth daily.        [name] warfarin  (COUMADIN) 5 MG tablet Take 2.5-5 mg by mouth daily. Mondays and fridays take 5mg  and rest of days take half of the 5mg  tablet (2.5mg )      . [DISCONTINUED] warfarin (COUMADIN) 5 MG tablet Take 1 tablet (5 mg total) by mouth daily.  30 tablet  3   No current facility-administered medications for this visit.    Physical Exam: Filed Vitals:   10/22/12 0931  BP: 120/77  Pulse: 73  Height: 5\' 9"  (1.753 m)  Weight: 154 lb (69.854 kg)    GEN- The patient is well appearing, alert and oriented x 3 today.   Head- normocephalic, atraumatic Eyes-  Sclera clear, conjunctiva pink Ears- hearing intact Oropharynx- clear Lungs- Clear to ausculation bilaterally, normal work of breathing Chest- ICD pocket is well healed Heart- Regular rate and rhythm, no murmurs, rubs or gallops, PMI not laterally displaced GI- soft, NT, ND, + BS Extremities- no clubbing, cyanosis, or edema  ICD interrogation- reviewed in detail today,  See PACEART report  Assessment and Plan:  1.  Chronic systolic dysfunction euvolemic today Stable on an appropriate medical regimen  2. VT  1 episode of VT 10/15/12 (CL 300-305 msec) successfully converted with a single 25J shock after 2 ATP attempts were unsuccessful.  This  VT cycle length is similar to what he had in January.  A second episode on the same day was the same cycle length again with unsuccessful ATP followed by a successful 25J sjock. See [facility] report No changes today No driving x 6 months   Replete K,  He sees Dr Margo Common later this week. Consider ablation if further VT.  Return to see me in 2 months in [place]

## 2012-10-22 NOTE — Patient Instructions (Signed)
Continue all current medications. Follow up in May

## 2012-10-23 ENCOUNTER — Other Ambulatory Visit: Payer: Self-pay | Admitting: Cardiology

## 2012-10-23 ENCOUNTER — Other Ambulatory Visit: Payer: Self-pay | Admitting: *Deleted

## 2012-10-23 MED ORDER — MEGESTROL ACETATE 40 MG/ML PO SUSP: ORAL | Status: DC

## 2012-10-28 ENCOUNTER — Ambulatory Visit (INDEPENDENT_AMBULATORY_CARE_PROVIDER_SITE_OTHER): Payer: Medicare Other | Admitting: *Deleted

## 2012-10-28 DIAGNOSIS — I4891 Unspecified atrial fibrillation: Secondary | ICD-10-CM

## 2012-10-28 DIAGNOSIS — Z7901 Long term (current) use of anticoagulants: Secondary | ICD-10-CM

## 2012-11-03 ENCOUNTER — Encounter: Payer: Self-pay | Admitting: Cardiology

## 2012-11-04 ENCOUNTER — Encounter: Payer: Self-pay | Admitting: Cardiology

## 2012-11-04 ENCOUNTER — Other Ambulatory Visit: Payer: Self-pay | Admitting: Physician Assistant

## 2012-11-04 ENCOUNTER — Encounter (HOSPITAL_COMMUNITY): Payer: Self-pay | Admitting: *Deleted

## 2012-11-04 ENCOUNTER — Inpatient Hospital Stay (HOSPITAL_COMMUNITY)
Admission: AD | Admit: 2012-11-04 | Discharge: 2012-11-08 | DRG: 250 | Disposition: A | Discharge status: Home / Self Care | Payer: Medicare Other | Source: Other Acute Inpatient Hospital | Attending: Internal Medicine | Admitting: Internal Medicine

## 2012-11-04 DIAGNOSIS — I5022 Chronic systolic (congestive) heart failure: Secondary | ICD-10-CM | POA: Diagnosis present

## 2012-11-04 DIAGNOSIS — I214 Non-ST elevation (NSTEMI) myocardial infarction: Secondary | ICD-10-CM | POA: Diagnosis present

## 2012-11-04 DIAGNOSIS — I4891 Unspecified atrial fibrillation: Secondary | ICD-10-CM

## 2012-11-04 DIAGNOSIS — Z8589 Personal history of malignant neoplasm of other organs and systems: Secondary | ICD-10-CM

## 2012-11-04 DIAGNOSIS — S68118A Complete traumatic metacarpophalangeal amputation of other finger, initial encounter: Secondary | ICD-10-CM

## 2012-11-04 DIAGNOSIS — I2589 Other forms of chronic ischemic heart disease: Secondary | ICD-10-CM

## 2012-11-04 DIAGNOSIS — R000 Tachycardia, unspecified: Secondary | ICD-10-CM

## 2012-11-04 DIAGNOSIS — I38 Endocarditis, valve unspecified: Secondary | ICD-10-CM

## 2012-11-04 DIAGNOSIS — I2510 Atherosclerotic heart disease of native coronary artery without angina pectoris: Secondary | ICD-10-CM

## 2012-11-04 DIAGNOSIS — I779 Disorder of arteries and arterioles, unspecified: Secondary | ICD-10-CM

## 2012-11-04 DIAGNOSIS — I2582 Chronic total occlusion of coronary artery: Secondary | ICD-10-CM | POA: Diagnosis present

## 2012-11-04 DIAGNOSIS — J4489 Other specified chronic obstructive pulmonary disease: Secondary | ICD-10-CM | POA: Diagnosis present

## 2012-11-04 DIAGNOSIS — I4729 Other ventricular tachycardia: Principal | ICD-10-CM | POA: Diagnosis present

## 2012-11-04 DIAGNOSIS — K219 Gastro-esophageal reflux disease without esophagitis: Secondary | ICD-10-CM | POA: Diagnosis present

## 2012-11-04 DIAGNOSIS — I6529 Occlusion and stenosis of unspecified carotid artery: Secondary | ICD-10-CM | POA: Diagnosis present

## 2012-11-04 DIAGNOSIS — E119 Type 2 diabetes mellitus without complications: Secondary | ICD-10-CM | POA: Diagnosis present

## 2012-11-04 DIAGNOSIS — I080 Rheumatic disorders of both mitral and aortic valves: Secondary | ICD-10-CM | POA: Diagnosis present

## 2012-11-04 DIAGNOSIS — J449 Chronic obstructive pulmonary disease, unspecified: Secondary | ICD-10-CM | POA: Diagnosis present

## 2012-11-04 DIAGNOSIS — I10 Essential (primary) hypertension: Secondary | ICD-10-CM

## 2012-11-04 DIAGNOSIS — E785 Hyperlipidemia, unspecified: Secondary | ICD-10-CM | POA: Diagnosis present

## 2012-11-04 DIAGNOSIS — I472 Ventricular tachycardia, unspecified: Principal | ICD-10-CM

## 2012-11-04 DIAGNOSIS — I252 Old myocardial infarction: Secondary | ICD-10-CM

## 2012-11-04 DIAGNOSIS — Z79899 Other long term (current) drug therapy: Secondary | ICD-10-CM

## 2012-11-04 DIAGNOSIS — Z95810 Presence of automatic (implantable) cardiac defibrillator: Secondary | ICD-10-CM

## 2012-11-04 DIAGNOSIS — Z7901 Long term (current) use of anticoagulants: Secondary | ICD-10-CM

## 2012-11-04 DIAGNOSIS — I739 Peripheral vascular disease, unspecified: Secondary | ICD-10-CM | POA: Diagnosis present

## 2012-11-04 DIAGNOSIS — IMO0002 Reserved for concepts with insufficient information to code with codable children: Secondary | ICD-10-CM

## 2012-11-04 DIAGNOSIS — Z87891 Personal history of nicotine dependence: Secondary | ICD-10-CM

## 2012-11-04 DIAGNOSIS — I509 Heart failure, unspecified: Secondary | ICD-10-CM

## 2012-11-04 DIAGNOSIS — Z923 Personal history of irradiation: Secondary | ICD-10-CM

## 2012-11-04 MED ORDER — MEXILETINE HCL 200 MG PO CAPS
200.0000 mg | ORAL_CAPSULE | Freq: Three times a day (TID) | ORAL | Status: DC
  Administered 2012-11-04: 200 mg via ORAL
  Filled 2012-11-04 (×5): qty 1

## 2012-11-04 MED ORDER — WARFARIN SODIUM 5 MG PO TABS
5.0000 mg | ORAL_TABLET | Freq: Once | ORAL | Status: AC
  Administered 2012-11-04: 5 mg via ORAL
  Filled 2012-11-04: qty 1

## 2012-11-04 MED ORDER — PREDNISONE 5 MG PO TABS
5.0000 mg | ORAL_TABLET | Freq: Every day | ORAL | Status: DC
  Administered 2012-11-05 – 2012-11-08 (×4): 5 mg via ORAL
  Filled 2012-11-04 (×5): qty 1

## 2012-11-04 MED ORDER — ASPIRIN EC 81 MG PO TBEC
81.0000 mg | DELAYED_RELEASE_TABLET | Freq: Every day | ORAL | Status: DC
  Administered 2012-11-05 – 2012-11-06 (×2): 81 mg via ORAL
  Filled 2012-11-04 (×2): qty 1

## 2012-11-04 MED ORDER — METOPROLOL SUCCINATE ER 100 MG PO TB24
100.0000 mg | ORAL_TABLET | Freq: Every day | ORAL | Status: DC
  Administered 2012-11-04 – 2012-11-08 (×5): 100 mg via ORAL
  Filled 2012-11-04 (×5): qty 1

## 2012-11-04 MED ORDER — ACETAMINOPHEN 325 MG PO TABS: 650.0000 mg | ORAL_TABLET | ORAL | Status: DC | PRN

## 2012-11-04 MED ORDER — SIMVASTATIN 20 MG PO TABS
20.0000 mg | ORAL_TABLET | Freq: Every day | ORAL | Status: DC
  Administered 2012-11-04 – 2012-11-08 (×5): 20 mg via ORAL
  Filled 2012-11-04 (×5): qty 1

## 2012-11-04 MED ORDER — SOTALOL HCL 120 MG PO TABS
120.0000 mg | ORAL_TABLET | Freq: Two times a day (BID) | ORAL | Status: DC
  Administered 2012-11-04: 120 mg via ORAL
  Filled 2012-11-04 (×3): qty 1

## 2012-11-04 MED ORDER — SPIRONOLACTONE 25 MG PO TABS
25.0000 mg | ORAL_TABLET | Freq: Every day | ORAL | Status: DC
  Filled 2012-11-04: qty 1

## 2012-11-04 MED ORDER — ONDANSETRON HCL 4 MG/2ML IJ SOLN: 4.0000 mg | Freq: Four times a day (QID) | INTRAMUSCULAR | Status: DC | PRN

## 2012-11-04 MED ORDER — WARFARIN - PHARMACIST DOSING INPATIENT
Freq: Every day | Status: DC
  Administered 2012-11-06 – 2012-11-08 (×2)

## 2012-11-04 MED ORDER — NITROGLYCERIN 0.4 MG SL SUBL: 0.4000 mg | SUBLINGUAL_TABLET | SUBLINGUAL | Status: DC | PRN

## 2012-11-04 MED ORDER — METOPROLOL SUCCINATE ER 50 MG PO TB24
50.0000 mg | ORAL_TABLET | Freq: Every day | ORAL | Status: DC
  Filled 2012-11-04: qty 1

## 2012-11-04 MED ORDER — FUROSEMIDE 40 MG PO TABS
40.0000 mg | ORAL_TABLET | Freq: Every day | ORAL | Status: DC
  Administered 2012-11-05 – 2012-11-08 (×4): 40 mg via ORAL
  Filled 2012-11-04 (×4): qty 1

## 2012-11-04 MED ORDER — SPIRONOLACTONE 25 MG PO TABS
25.0000 mg | ORAL_TABLET | Freq: Every day | ORAL | Status: DC
  Administered 2012-11-05 – 2012-11-08 (×4): 25 mg via ORAL
  Filled 2012-11-04 (×5): qty 1

## 2012-11-04 MED ORDER — POTASSIUM CHLORIDE CRYS ER 10 MEQ PO TBCR
10.0000 meq | EXTENDED_RELEASE_TABLET | Freq: Every day | ORAL | Status: DC
Start: 2012-11-05 — End: 2012-11-08
  Administered 2012-11-05 – 2012-11-08 (×4): 10 meq via ORAL
  Filled 2012-11-04 (×4): qty 1

## 2012-11-04 MED ORDER — POTASSIUM CHLORIDE CRYS ER 10 MEQ PO TBCR
10.0000 meq | EXTENDED_RELEASE_TABLET | Freq: Every day | ORAL | Status: DC
  Filled 2012-11-04: qty 1

## 2012-11-04 MED ORDER — MEXILETINE HCL 200 MG PO CAPS
200.0000 mg | ORAL_CAPSULE | Freq: Two times a day (BID) | ORAL | Status: DC
  Filled 2012-11-04: qty 1

## 2012-11-04 MED ORDER — DEXTROSE 5 % IV SOLN
1.0000 g | Freq: Every day | INTRAVENOUS | Status: DC
  Administered 2012-11-04 – 2012-11-07 (×4): 1 g via INTRAVENOUS
  Filled 2012-11-04 (×5): qty 10

## 2012-11-04 NOTE — H&P (Signed)
NAME:  [name], [name] ROOM: 250  UNIT NUMBER:  [id number] LOCATION: [facility] 250 07 ADM/VISIT DATE:  11/03/2012   ADM Vaughan BrownerKathaleen Grinder:  [phone] DOB: 03/27/1929   PRIMARY CARDIOLOGIST:  Rollene Rotunda, M.D.  PRIMARY ELECTROPHYSIOLOGIST:  Hillis Range, M.D.  REFERRING PHYSICIAN:  Wende Crease, M.D.  REASON FOR CONSULTATION:  Congestive heart failure, wide complex tachycardia.  HISTORY OF PRESENT ILLNESS:  [name] is an 79 year old male, very well known to [place], with severe ischemic cardiomyopathy (EF of 15%), remote CABG, and recent episode of ventricular tachycardia (10/15/2012) in the setting of hypokalemia (2.9) successfully converted with a single shock of 25 joules after two unsuccessful ATP attempts.  The patient had just been seen here in our [place] clinic for a routine EP followup on 10/22/2012 at which time Dr. Johney Frame made no further adjustments of his device.  Recommendation was to replete potassium and the patient was advised to refrain from driving for 6 months.  The patient presented to the emergency room last evening with complaint of dyspnea after having experienced nausea/vomiting/diarrhea the previous evening.  Of note, he denied any chest pain, tachy palpitations, or firing of his defibrillator.  Since admission the patient has maintained potassium levels within normal range.  Although BNP was markedly elevated, there was no evidence of pulmonary edema on two serial chest x-rays.  Admission EKG suggested atrial pacing at 74 bpm.  The patient subsequently developed multiple successive salvos of wide complex tachycardia at approximately 120 bpm.  A 12-lead EKG was obtained during one of these episodes, which calculated the ventricular rate at 124 bpm.  Of note, the computer read the second EKG as sinus tachycardia.  The patient has reported some occasional chest pressure as well as palpitations; however, interestingly, these resolve once his accelerated heart rate returns to  baseline.  Serial troponins notable for an initial level of 0.77 and a followup of 0.57.  ALLERGIES:  No known drug allergies.  HOME MEDICATIONS: 1. Aldactone 25 mg daily. 2. Loratadine 10 mg daily. 3. Deltasone 5 mg daily. 4. Diovan 20 mg daily. 5. Lasix 40 mg daily. 6. Megace 40 mg p.r.n. 7. Mexitil 200 mg b.i.d. 8. Potassium chloride 10 mEq daily. 9. Formoterol 20 mcg inhalation b.i.d. 10. Pulmicort nebulizer 0.5 mg inhalation b.i.d. 11. Simvastatin 40 mg q.h.s. 12. Omeprazole 20 mg daily. 13. Sotalol 120 mg b.i.d. 14. Toprol XL 40 mg daily. 15. Coumadin 5 mg every Monday, Wednesday, Friday and 2.5 mg every Tuesday, Thursday, Saturday, Sunday.  PAST MEDICAL HISTORY: 1. Ischemic cardiomyopathy. A. Patent LIMA-LAD, SVG-OM, and SVG-PDA graft; EF 15% with global hypokinesis, cardiac catheterization August 2013. B. Status post CABG August 2007. C. NSTEMI August 2013 in setting of ventricular tachycardia. D. Remote myocardial infarction. 2. Ventricular tachycardia. A. Status post ICD implantation. B. Status post ICD discharge in setting of hypokalemia (2.9) 10/15/2012. 3. Chronic systolic heart failure. 4. Atrial fibrillation. A. Amiodarone intolerant, on Sotalol. 5. Valvular heart disease; mild/moderate MR; mild AR. 6. HTN. 7. DM. 8. Syncope/collapse. 9. COPD. 10. HLD. 11. Carotid artery disease. A. Status post left CEA 2007. 12. Head/neck cancer. A. Status post radiation therapy. 13. Orthostatic hypotension. 14. Status post left knee surgery. 15. Adenomatous polyps. A. Status post colon resection. 16. Right cataract surgery. 17. Status post right hand finger amputation, traumatic. 18. GERD.  SOCIAL HISTORY:  Married, five grown children.  Remote history of tobacco smoking.  FAMILY HISTORY:  Noncontributory for premature coronary artery disease.  REVIEW OF SYSTEMS:  Denies  any recent fever.  His wife reports that he has lost approximately 8 pounds over the last  month.  He complains of feeling chronically weak and tired.  Remaining systems are reviewed and are negative.  PHYSICAL EXAMINATION:  Vital signs:  Blood pressure currently 97/67, pulse ranging 120-130 and regular, temperature afebrile since admission, sats 99% on 2 liters, weight 151 pounds.  General:  An 79 year old male, chronically ill-appearing, lying supine, no apparent distress.  HEENT:  Normocephalic, atraumatic.  PERRLA, EOMI.  Neck:  Palpable ballotable carotid pulses without bruits.  Jugular venous distention noted on the right at 30 degrees.  Lungs:  Diminished breath sounds throughout with no crackles or wheezes.  Heart:  Regular rate and rhythm with a grade 2/6 holosystolic murmur at the base.  No diastolic flow.  No S3 gallop.  Abdomen:  Soft, nontender with intact bowel sounds.  Extremities:  No peripheral edema.  Skin:  Pallor.  Musculoskeletal: No obvious deformity.  Neurological:  Alert and oriented.  CT angiogram of the chest:  Negative for pulmonary embolus or acute pathology, pulmonary arterial hypertension, question of left renal stones.  Admission chest x-ray:  No acute changes.  Repeat EKG this a.m. indicates no significant change with evidence of emphysema.  Admission EKG:  Atrial pacing at 74 bpm.  LABORATORY DATA:  Troponin I 0.77, 0.57, third set pending.  INR 1.8 on admission.  D-dimer 0.52.  BNP 3300 on admission.  LFTs normal.  Urinalysis:  Negative for leukocyte esterase/nitrates, moderate blood, trace protein, large bacteria, 0-5 epithelial, and TNTC hyaline casts.  Urine culture pending.  Sodium 132, potassium 3.6 (4.0 on admission), BUN 22, creatinine 0.9 (1.2 on admission), glucose 91.  WBC 9000, hemoglobin 11.6, hematocrit 34, platelets 235,000.  IMPRESSION: 1. Wide complex tachycardia. A. Question recurrent ventricular tachycardia. 2. Severe ischemic cardiomyopathy. A. Ejection fraction of 15%. B. Patent bypass grafts August 2013. 3. Non-ST elevation  myocardial infarction (type 2). A. Demand ischemia in setting of tachyarrhythmia. 4. Ventricular tachycardia, recurrent. A. Status post implantable cardiac defibrillator discharge March 2014 in setting of hypokalemia.  PLAN:  Recommendation is to transfer patient directly to [facility] for continued close monitoring and further recommendations regarding his wide complex tachycardia.  We will request formal evaluation by our electrophysiology team.  In the meanwhile, the patient will be continued on his current medication regimen.  The patient was seen and examined in conjunction with Dr. Diona Browner.  An extensive review was done of the patient's recent hospitalization and office records.     GENE SERPE, P.A.   Attending note:  Patient seen and examined. Reviewed recent records, and discussed the case with Mr. Shara Blazing [facility]. He presents complaining of progressive shortness of breath, has had recurring episodes of sustained wide complex tachycardia, not prompting defibrillation with his device during this observation. Potassium has not been significantly depleted as noted previously. He reports compliance with his medications which include antiarrhythmic therapy with both sotalol and mexiletine, also Toprol XL. Chart lists prior amiodarone intolerance. Of note, he reports breathlessness while being observed to be in wide-complex tachycardia, reports improvement in symptoms when this resolved. He has a history of atrial fibrillation, although the events are regular and suggestive of possible "slow" VT.  I am not certain about his device settings.  Troponin I levels are mildly increased, would suspect demand ischemia with recurring tachycardia. He had similar presentation last year at which time he was noted to have patent grafts. Situation was discussed with the patient  and his family, and recommendation is to have him transferred to our cardiology service at [facility] so that formal EP  consultation and device interrogation can be obtained.  Jonelle Sidle, M.D., F.A.C.C.

## 2012-11-04 NOTE — Consult Note (Signed)
ELECTROPHYSIOLOGY CONSULT NOTE  Patient ID: [name] MRN: [id number], DOB/AGE: November 17, 1928   Admit date: 11/04/2012 Date of Consult: 11/04/2012  Primary Physician: Louie Boston, MD Primary Cardiologist: Antoine Poche, MD Reason for Consultation: WCT  History of Present Illness [name] is an 79 year old man with an ischemic CM, EF 15% by cath Aug 2013, CAD s/p CABG and paroxysmal VT who was admitted to [facility] yesterday with SOB. On admission he denies CP, palpitations, syncope or ICD shocks. His potassium level was normal. He did not appear to be volume overloaded despite having an elevated BNP (may be chronic). While there he developed a WCT at approximately 120 bpm which was symptomatic with chest pressure, palpitations and dizziness. Therefore, he has been transferred to [facility] for further evaluation. [name] reports these symptoms have been occurring 4-5 times per day. He has experienced 3 falls in the last 3-4 months but no syncope. He denies exertional symptoms but states he doesn't perform many exertional activities. He denies LE swelling or PND. He has chronic 2-pillow orthopnea and denies worsening. He reports compliance with his medications.   Of note, he was recently seen in follow-up by Dr. Johney Frame on 10/22/2012 at which time his ICD interrogation revealed 2 VT episodes - 1st episode of VT 10/15/12 (CL 300-305 msec) successfully converted with a single 25J shock after 2 ATP attempts were unsuccessful. This VT cycle length is similar to what he had in January 2014. The 2nd episode on the same day (10/15/2012) was the same cycle length again with unsuccessful ATP followed by a successful 25J sjock.   Past Medical History Past Medical History  Diagnosis Date  . Hypertension   . Coronary artery disease     a. CABG 03/2006. b. NSTEMI 03/2012 following VT likely demand ischemia - grafts patent at cath.  . Diabetes mellitus   . Other specified forms of chronic ischemic heart  disease   . Syncope and collapse   . Automatic implantable cardiac defibrillator in situ     QRS duration 120 ms no typical left bundle branch block.  . Atrial fibrillation     Taking sotalol, intolerant to amiodarone  . Valvular heart disease     Mild to moderate mitral valve insufficiency and mild aortic insufficiency  . Chronic airway obstruction, not elsewhere classified   . Old myocardial infarction   . Other and unspecified hyperlipidemia   . Congestive heart failure, unspecified     a. Ischemic cardiomyopathy ejection fraction 30-35% previously. b. Down to 15% by cath 03/2012.  [name] Cerebrovascular disease, unspecified   . Other mechanical complication of other internal orthopedic device, implant, and graft   . Carotid artery disease     s/p LCEA 2007.  [name] Head and neck cancer     Receiving radiation therapy.  . Orthostatic hypotension   . Ventricular tachycardia     a. S/p ICD implantation. b. Recurrent in 03/2012 with medication adjustment.    Past Surgical History Past Surgical History  Procedure Laterality Date  . Repeat otif left knee with figure-of-eight tension band.    . Implantation of a dual-chamber implantable  06/18/2007  . Left carotid endarterectomy    . Coronary artery bypass graft  04/04/2006     Allergies/Intolerances No Known Allergies  Current Home Medications   Medication List    ASK your doctor about these medications       acetaminophen 500 MG tablet  Commonly known as:  TYLENOL  Take 1,000  mg by mouth every 6 (six) hours as needed. For pain     ALDACTONE 25 MG tablet  Generic drug:  spironolactone  TAKE 1 TABLET ONCE DAILY.     budesonide 0.5 MG/2ML nebulizer solution  Commonly known as:  PULMICORT  Take 0.5 mg by nebulization 2 (two) times daily.     FLONASE 50 MCG/ACT nasal spray  Generic drug:  fluticasone  USE (2) SPRAYS IN EACH NOSTRIL ONCE DAILY.     formoterol 20 MCG/2ML nebulizer solution  Commonly known as:  PERFOROMIST  Take  20 mcg by nebulization 2 (two) times daily.     furosemide 40 MG tablet  Commonly known as:  LASIX  TAKE 1 & 1/2 TABLET DAILY.     loratadine 10 MG tablet  Commonly known as:  CLARITIN  Take 10 mg by mouth daily. For allergies     megestrol 40 MG/ML suspension  Commonly known as:  MEGACE  Take 2 teaspoonfuls (10ml) by mouth daily     metoprolol succinate 50 MG 24 hr tablet  Commonly known as:  TOPROL-XL  Take 1 tablet (50 mg total) by mouth daily.     mexiletine 200 MG capsule  Commonly known as:  MEXITIL  Take 1 capsule (200 mg total) by mouth every 12 (twelve) hours.     nitroGLYCERIN 0.4 MG SL tablet  Commonly known as:  NITROSTAT  Place 1 tablet (0.4 mg total) under the tongue every 5 (five) minutes as needed for chest pain.     omeprazole 20 MG capsule  Commonly known as:  PRILOSEC  Take 20 mg by mouth daily.     potassium chloride 10 MEQ tablet  Commonly known as:  K-DUR  Take 10 mEq by mouth 2 (two) times daily.     predniSONE 5 MG tablet  Commonly known as:  DELTASONE  Take 5 mg by mouth daily.     simvastatin 40 MG tablet  Commonly known as:  ZOCOR  Take 40 mg by mouth at bedtime.     sodium fluoride 1.1 % Gel dental gel  Commonly known as:  FLUORISHIELD  Place 1 application onto teeth at bedtime. Cal Dr. Everardo Beals in [place], [place] for future refills.     sotalol 120 MG tablet  Commonly known as:  BETAPACE  Take 1 tablet (120 mg total) by mouth 2 (two) times daily.     valsartan 80 MG tablet  Commonly known as:  DIOVAN  Take 40 mg by mouth daily.     warfarin 5 MG tablet  Commonly known as:  COUMADIN  Take 2.5-5 mg by mouth daily. Mondays and fridays take 5mg  and rest of days take half of the 5mg  tablet (2.5mg )       Family History Positive for CAD   Social History Social History  . Marital Status: Married   Occupational History  . Retired    Social History Main Topics  . Smoking status: Former Smoker -- 2.00 packs/day for 60 years    Types:  Cigarettes    Quit date: 08/06/1990  . Smokeless tobacco: Never Used  . Alcohol Use: No  . Drug Use: No   Review of Systems General: No chills, fever, night sweats or weight changes  Cardiovascular: +CP +palpitations  No dyspnea on exertion, edema, worsening orthopnea, paroxysmal nocturnal dyspnea Dermatological: No rash, lesions or masses Respiratory: No cough, dyspnea Urologic: No hematuria, dysuria Abdominal: No nausea, vomiting, diarrhea, bright red blood per rectum, melena, or hematemesis Neurologic: +dizziness  No visual changes, weakness, changes in mental status All other systems reviewed and are otherwise negative except as noted above.  Physical Exam Blood pressure 117/61, pulse 74, temperature 97.5 F (36.4 C), resp. rate 19, height 5\' 9"  (1.753 m), weight 150 lb 2.1 oz (68.1 kg), SpO2 100.00%.  General: Well developed, well appearing 79 year old male in no acute distress. HEENT: Normocephalic, atraumatic. EOMs intact. Sclera nonicteric. Oropharynx clear.  Neck: Supple without bruits. No JVD. Lungs: Respirations regular and unlabored, CTA bilaterally. No wheezes, rales or rhonchi. Heart: RRR. S1, S2 present. No murmurs, rub, S3 or S4. Abdomen: Soft, non-tender, non-distended. BS present x 4 quadrants. No hepatosplenomegaly.  Extremities: No clubbing, cyanosis or edema. DP/PT/Radials 2+ and equal bilaterally. Psych: Normal affect. Neuro: Alert and oriented X 3. Moves all extremities spontaneously. Musculoskeletal: No kyphosis. Skin: Intact. Warm and dry. No rashes or petechiae in exposed areas.   Labs Admission labs pending; labs from [facility] were reviewed  Cardiac catheterization August 2013 Procedural Findings:  Hemodynamics:  AO 120/46 (73)  LV 134/28  No gradient on pullback.  Coronary angiography:  Coronary dominance: right  Left mainstem: The LMCA is calcified and demonstrates on ostial stenosis of 80%. There is good flow into the ramus, with no obstructive  calcified plaque.  Left anterior descending (LAD): Demonstrates probably a significant lesion just beyond the diagonal bifurcation.  The SVG to the OM is widely patent with retrograde filling back to the LMCA  The LIMA to the LAD is widely patent with about 30% ostial narrowing. The flow distally is competitive.  Left circumflex (LCx): diffusely plaque with distal competitive filling  Right coronary artery (RCA): Diffusely ectatic proximally then total occlusion beyond a marginal branch.  The SVG to PDA is patent. There is tapered narrowing of less than 40% at its insertion to the PDA. There is brisk filling of the PDA retrograde into the PLA.  Left ventriculography: EF is reduced with global hypokinesis, and EF approximately 15%.  Final Conclusions:  1. Ischemic CM with severe global hypokinesis  2. Patent IMA to the LAD.  3. Patent SVG to the OM  4. Patent SVG to PDA with some mild insertion narrowing.  Recommendations:  1. Compare with prior films to review any changes.  2. Medical therapy.  3. Treatment of VT.    12-lead ECG pending Telemetry strips reviewed from [facility] show intermittent WCT at 125 bpm, most consistent with slow VT Telemetry here shows the same - slow, monomorphic VT at 125 bpm, terminated spontaneously in the CCU; now in SR   Assessment and Plan 1. Slow, monomorphic VT 2. Known paroxysmal VT 3. Ischemic CM, dual chamber ICD in place 4. CAD s/p CABG, cath in Aug 2013 - stable CAD 5. Chronic systolic HF - not volume overloaded currently 6. PAF 7. PVD [name] has symptomatic slow VT. His ICD tachy therapy was adjusted today. His VT-1 zone was lowered to 120 bpm and programmed to ATP only. There were no other programming changes made. He will remain for observation in the CCU tonight and will continue AAD therapy with sotalol and mexiletine. At this time, ? whether or not he is a candidate for VT ablation. He will continue medical therapy for CAD and  ICM. He does not appear volume overloaded currently. Dr. Johney Frame to see.   Signed, Rick Duff, PA-C 11/04/2012, 2:42 PM  I have seen, examined the patient, and reviewed the above assessment and plan.  Changes to above are made  where necessary.  The patient returns with recurrent VT below the detection zone of his ICD (rate 127 bpm) Device interrogation today reveals normal function,  VT zone decreased to 120 bpm and ATP only therapies programmed.  I suspect that he will require VT ablation, though he has been reluctant to do this previously. At this time, I will increase mixelitine to 200mg  Q8 hours and also increase toprol.  We could consider increasing sotalol if necessary.  Co Sign: Hillis Range, MD 11/04/2012 5:13 PM

## 2012-11-04 NOTE — Progress Notes (Signed)
Utilization Review Completed.[name] T4/08/2012  

## 2012-11-04 NOTE — Progress Notes (Signed)
ANTICOAGULATION CONSULT NOTE - Initial Consult  Pharmacy Consult for Coumadin  Indication: atrial fibrillation  No Known Allergies  Patient Measurements: Height: 5\' 9"  (175.3 cm) Weight: 150 lb 2.1 oz (68.1 kg) IBW/kg (Calculated) : 70.7   Vital Signs: Temp: 97.5 F (36.4 C) (04/01 1330) BP: 136/59 mmHg (04/01 1500) Pulse Rate: 74 (04/01 1500)  Labs: No results found for this basename: HGB, HCT, PLT, APTT, LABPROT, INR, HEPARINUNFRC, CREATININE, CKTOTAL, CKMB, TROPONINI,  in the last 72 hours  Estimated Creatinine Clearance: 46.5 ml/min (by C-G formula based on Cr of 1.2).   Medical History: Past Medical History  Diagnosis Date  . Hypertension   . Coronary artery disease     a. CABG 03/2006. b. NSTEMI 03/2012 following VT likely demand ischemia - grafts patent at cath.  . Diabetes mellitus   . Other specified forms of chronic ischemic heart disease   . Syncope and collapse   . Automatic implantable cardiac defibrillator in situ     QRS duration 120 ms no typical left bundle branch block.  . Atrial fibrillation     Taking sotalol, intolerant to amiodarone  . Valvular heart disease     Mild to moderate mitral valve insufficiency and mild aortic insufficiency  . Chronic airway obstruction, not elsewhere classified   . Old myocardial infarction   . Other and unspecified hyperlipidemia   . Congestive heart failure, unspecified     a. Ischemic cardiomyopathy ejection fraction 30-35% previously. b. Down to 15% by cath 03/2012.  [name] Cerebrovascular disease, unspecified   . Other mechanical complication of other internal orthopedic device, implant, and graft   . Carotid artery disease     s/p LCEA 2007.  [name] Head and neck cancer     Receiving radiation therapy.  . Orthostatic hypotension   . Ventricular tachycardia     a. S/p ICD implantation. b. Recurrent in 03/2012 with medication adjustment.      Assessment: 83yom with EF 15% on Coumadin for AFib.  He presented to  [facility] 3/31 with SOB and found to have WCT.  His INR 3/31 was 1.8 he states his Coumadin was given to him at [facility] but I did not find documentation of this  in the shadow chart - only confirmation of meds given to him today 4/1.   He was transferred to [facility] 4/1.    Goal of Therapy:  INR 2-3 Monitor platelets by anticoagulation protocol: Yes   Plan:  Coumadin 5mg  x1 tonight  Daily INR  Leota Sauers Pharm.D. CPP, BCPS Clinical Pharmacist [phone] 11/04/2012 3:43 PM

## 2012-11-05 DIAGNOSIS — I059 Rheumatic mitral valve disease, unspecified: Secondary | ICD-10-CM

## 2012-11-05 DIAGNOSIS — I779 Disorder of arteries and arterioles, unspecified: Secondary | ICD-10-CM

## 2012-11-05 LAB — BASIC METABOLIC PANEL
BUN: 16 mg/dL (ref 6–23)
Chloride: 96 mEq/L (ref 96–112)
Creatinine, Ser: 0.86 mg/dL (ref 0.50–1.35)
GFR calc Af Amer: >90 mL/min (ref >90)

## 2012-11-05 MED ORDER — FORMOTEROL FUMARATE 20 MCG/2ML IN NEBU: 20.0000 ug | INHALATION_SOLUTION | Freq: Two times a day (BID) | RESPIRATORY_TRACT | Status: DC

## 2012-11-05 MED ORDER — PERFLUTREN LIPID MICROSPHERE
INTRAVENOUS | Status: AC
  Administered 2012-11-05: 2 mL
  Filled 2012-11-05: qty 10

## 2012-11-05 MED ORDER — AMIODARONE HCL IN DEXTROSE 360-4.14 MG/200ML-% IV SOLN: 30.0000 mg/h | INTRAVENOUS | Status: DC

## 2012-11-05 MED ORDER — POTASSIUM CHLORIDE CRYS ER 20 MEQ PO TBCR
40.0000 meq | EXTENDED_RELEASE_TABLET | Freq: Once | ORAL | Status: AC
  Administered 2012-11-05: 40 meq via ORAL

## 2012-11-05 MED ORDER — WARFARIN SODIUM 5 MG PO TABS
5.0000 mg | ORAL_TABLET | ORAL | Status: DC
  Administered 2012-11-05 – 2012-11-07 (×2): 5 mg via ORAL
  Filled 2012-11-05 (×2): qty 1

## 2012-11-05 MED ORDER — POTASSIUM CHLORIDE CRYS ER 20 MEQ PO TBCR
EXTENDED_RELEASE_TABLET | ORAL | Status: AC
  Administered 2012-11-05: 40 meq
  Filled 2012-11-05: qty 2

## 2012-11-05 MED ORDER — BUDESONIDE 0.5 MG/2ML IN SUSP
0.5000 mg | Freq: Two times a day (BID) | RESPIRATORY_TRACT | Status: DC
  Administered 2012-11-05 – 2012-11-08 (×5): 0.5 mg via RESPIRATORY_TRACT
  Filled 2012-11-05 (×8): qty 2

## 2012-11-05 MED ORDER — LIDOCAINE BOLUS VIA INFUSION
75.0000 mg | Freq: Once | INTRAVENOUS | Status: AC
  Administered 2012-11-05: 75 mg via INTRAVENOUS
  Filled 2012-11-05: qty 76

## 2012-11-05 MED ORDER — AMIODARONE LOAD VIA INFUSION
150.0000 mg | Freq: Once | INTRAVENOUS | Status: AC
  Administered 2012-11-05: 150 mg via INTRAVENOUS
  Filled 2012-11-05: qty 83.34

## 2012-11-05 MED ORDER — LIDOCAINE IN D5W 4-5 MG/ML-% IV SOLN
1.0000 mg/min | INTRAVENOUS | Status: DC
  Administered 2012-11-05: 1 mg/min via INTRAVENOUS
  Filled 2012-11-05: qty 250

## 2012-11-05 MED ORDER — AMIODARONE HCL IN DEXTROSE 360-4.14 MG/200ML-% IV SOLN: 60.0000 mg/h | INTRAVENOUS | Status: DC

## 2012-11-05 MED ORDER — LIDOCAINE IN D5W 4-5 MG/ML-% IV SOLN
1.0000 mg/min | INTRAVENOUS | Status: DC
  Filled 2012-11-05: qty 250

## 2012-11-05 MED ORDER — POTASSIUM CHLORIDE CRYS ER 20 MEQ PO TBCR
40.0000 meq | EXTENDED_RELEASE_TABLET | Freq: Once | ORAL | Status: AC
  Administered 2012-11-05: 40 meq via ORAL
  Filled 2012-11-05: qty 2

## 2012-11-05 MED ORDER — AMIODARONE HCL IN DEXTROSE 360-4.14 MG/200ML-% IV SOLN
INTRAVENOUS | Status: AC
  Filled 2012-11-05: qty 200

## 2012-11-05 MED ORDER — SODIUM CHLORIDE 0.9 % IV SOLN
INTRAVENOUS | Status: DC
  Administered 2012-11-05: 10 mL/h via INTRAVENOUS

## 2012-11-05 MED ORDER — FLUTICASONE PROPIONATE 50 MCG/ACT NA SUSP
1.0000 | Freq: Every day | NASAL | Status: DC
  Filled 2012-11-05: qty 16

## 2012-11-05 MED ORDER — ARFORMOTEROL TARTRATE 15 MCG/2ML IN NEBU
15.0000 ug | INHALATION_SOLUTION | Freq: Two times a day (BID) | RESPIRATORY_TRACT | Status: DC
  Administered 2012-11-05 – 2012-11-08 (×4): 15 ug via RESPIRATORY_TRACT
  Filled 2012-11-05 (×9): qty 2

## 2012-11-05 MED ORDER — SOTALOL HCL 80 MG PO TABS
160.0000 mg | ORAL_TABLET | Freq: Two times a day (BID) | ORAL | Status: DC
  Administered 2012-11-05 – 2012-11-08 (×7): 160 mg via ORAL
  Filled 2012-11-05 (×8): qty 2

## 2012-11-05 MED ORDER — WARFARIN SODIUM 2.5 MG PO TABS
2.5000 mg | ORAL_TABLET | ORAL | Status: DC
  Administered 2012-11-06 – 2012-11-08 (×2): 2.5 mg via ORAL
  Filled 2012-11-05 (×2): qty 1

## 2012-11-05 MED ORDER — PERFLUTREN LIPID MICROSPHERE
1.0000 mL | INTRAVENOUS | Status: AC | PRN
  Administered 2012-11-05: 1 mL via INTRAVENOUS
  Filled 2012-11-05: qty 10

## 2012-11-05 NOTE — Progress Notes (Signed)
Called and notifed dr. Mayford Knife of k 3.3  .  Orders  For potassium.  Wants to stop amiodarone .  Advised that pt had already received bolus.  Pt remains in and out of v tach.

## 2012-11-05 NOTE — Progress Notes (Signed)
  Echocardiogram 2D Echocardiogram has been performed.  [name] 11/05/2012, 12:57 PM

## 2012-11-05 NOTE — Progress Notes (Signed)
Pt in v tach rate 110-120's  .  Lasting 5-10 minutes.  Pt states he feels ok.  B/p during v-tach 73/37.   Called and notified dr. Mayford Knife.  Orders for magnesium and bmet.  Continue to monitor pt for v-tach.

## 2012-11-05 NOTE — Progress Notes (Signed)
Pt remains in v tach.  Asymtomatic.  Paged md on call.

## 2012-11-05 NOTE — Progress Notes (Signed)
Patient: [name] Date of Encounter: 11/05/2012, 8:02 AM Admit date: 11/04/2012     Subjective  Mr. Alderman reports intermittent palpitations, SOB and chest pressure with VT.   Objective  Physical Exam: Vitals: BP 97/54  Pulse 77  Temp(Src) 98.1 F (36.7 C) (Oral)  Resp 15  Ht 5\' 9"  (1.753 m)  Wt 150 lb 2.1 oz (68.1 kg)  BMI 22.16 kg/m2  SpO2 98% General: Well developed 79 year old male in no acute distress. Neck: Supple. JVD not elevated. Lungs: Clear bilaterally to auscultation without wheezes, rales, or rhonchi. Breathing is unlabored. Heart: RRR S1 S2 without murmurs, rubs, or gallops.  Abdomen: Soft, non-distended. Extremities: No clubbing or cyanosis. No edema.  Distal pedal pulses are 2+ and equal bilaterally. Neuro: Alert and oriented X 3. Moves all extremities spontaneously. No focal deficits.  Intake/Output:  Intake/Output Summary (Last 24 hours) at 11/05/12 0802 Last data filed at 11/05/12 0600  Gross per 24 hour  Intake 452.88 ml  Output   1050 ml  Net -597.12 ml    Inpatient Medications:  . amiodarone (NEXTERONE PREMIX) 360 mg/200 mL dextrose      . aspirin EC  81 mg Oral Daily  . cefTRIAXone (ROCEPHIN)  IV  1 g Intravenous Daily  . furosemide  40 mg Oral Daily  . metoprolol succinate  100 mg Oral Daily  . potassium chloride  10 mEq Oral Daily  . predniSONE  5 mg Oral Q breakfast  . simvastatin  20 mg Oral q1800  . spironolactone  25 mg Oral Daily  . Warfarin - Pharmacist Dosing Inpatient   Does not apply q1800   . lidocaine 1 mg/min (11/05/12 0600)    Labs:  Recent Labs  11/05/12 0330  NA 133*  K 3.3*  CL 96  CO2 24  GLUCOSE 86  BUN 16  CREATININE 0.86  CALCIUM 9.0  MG 2.1    Recent Labs  11/05/12 0330  INR 2.08*    Radiology/Studies: No results found.   Telemetry: frequent slow, monomorphic VT    Assessment and Plan  1. Slow, monomorphic VT  2. Known paroxysmal VT  3. Ischemic CM, dual chamber ICD in place  4.  CAD s/p CABG, cath in Aug 2013 - stable CAD  5. Chronic systolic HF - not volume overloaded currently  6. PAF  7. PVD  Mr. Murnane has symptomatic slow VT. This is quite frequent despite medical therapy. His ICD tachy therapy was adjusted again today. His VT-1 zone was lowered again and is programmed to ATP only. He will remain in the CCU tonight and will continue AAD therapy for now. At this time, ? whether or not he is a candidate for VT ablation. He will continue medical therapy for CAD and ICM. He does not appear volume overloaded currently.   Dr. Johney Frame to see.  Signed, EDMISTEN, BROOKE PA-C  I have seen, examined the patient, and reviewed the above assessment and plan.  Changes to above are made where necessary.  He continues to have frequent VT.  Therapeutic strategies for ventricular tachycardia including medicine and ablation were discussed in detail with the patient today. Risk, benefits, and alternatives to EP study and radiofrequency ablation were also discussed in detail today. These risks include but are not limited to stroke, bleeding, vascular damage, tamponade, perforation, damage to the heart and other structures, AV block requiring pacemaker, worsening renal function, and death. The patient understands these risk and wishes to proceed.  We will therefore proceed with catheter ablation at the next available time.  I will anticipate that we will do this tomorrow.  NPO after midnight.  Hold lidocaine after 3 am.   Co Sign: Hillis Range, MD 11/05/2012 8:26 AM

## 2012-11-05 NOTE — Progress Notes (Signed)
Called to see patient due to recurrent VT.  Patient was admitted with VT that was initially nonsustained.  He was started on Sotolol and also is on Mexiletine.  Tonight he started having more frequent runs of VT that were not pace terminated due to VT rate at 118bpm and AICD VT zone for ATP set at 120bpm.  His potassium was noted to be 3.3 and potassium is currently being repleted.  His magnesium is normal  He was given a bolus of amiodarone but SBP dropped to .  Patient now having inscessant runs of VT associated with SOB.  His VT on occasion accelerates to 124bpm and then pace terminates to AV paced rhythm.  I will start IV lidocaine with IV bolus and then gtt.  I have contacted the [employer] rep to come in to reprogram AICD to VT detection rate at 115bpm so that his VT can be detected since it is responsive to ATP.  We will continue IV Lido to try suppress further VT.

## 2012-11-05 NOTE — Progress Notes (Signed)
Spoke with dr. Mayford Knife and notified of v tach.  Orders received for amiodarone. And to stop solatol

## 2012-11-05 NOTE — Progress Notes (Signed)
ANTICOAGULATION CONSULT NOTE - Follow Up Consult  Pharmacy Consult for Coumadin  Indication: atrial fibrillation  No Known Allergies  Patient Measurements: Height: 5\' 9"  (175.3 cm) Weight: 150 lb 2.1 oz (68.1 kg) IBW/kg (Calculated) : 70.7   Vital Signs: Temp: 97.5 F (36.4 C) (04/02 0800) Temp src: Oral (04/02 0800) BP: 113/84 mmHg (04/02 0800) Pulse Rate: 77 (04/02 0700)  Labs:  Recent Labs  11/05/12 0330  LABPROT 22.5*  INR 2.08*  CREATININE 0.86    Estimated Creatinine Clearance: 64.9 ml/min (by C-G formula based on Cr of 0.86).   Medical History: Past Medical History  Diagnosis Date  . Hypertension   . Coronary artery disease     a. CABG 03/2006. b. NSTEMI 03/2012 following VT likely demand ischemia - grafts patent at cath.  . Diabetes mellitus   . Other specified forms of chronic ischemic heart disease   . Syncope and collapse   . Automatic implantable cardiac defibrillator in situ     QRS duration 120 ms no typical left bundle branch block.  . Atrial fibrillation     Taking sotalol, intolerant to amiodarone  . Valvular heart disease     Mild to moderate mitral valve insufficiency and mild aortic insufficiency  . Chronic airway obstruction, not elsewhere classified   . Old myocardial infarction   . Other and unspecified hyperlipidemia   . Congestive heart failure, unspecified     a. Ischemic cardiomyopathy ejection fraction 30-35% previously. b. Down to 15% by cath 03/2012.  [name] Cerebrovascular disease, unspecified   . Other mechanical complication of other internal orthopedic device, implant, and graft   . Carotid artery disease     s/p LCEA 2007.  [name] Head and neck cancer     Receiving radiation therapy.  . Orthostatic hypotension   . Ventricular tachycardia     a. S/p ICD implantation. b. Recurrent in 03/2012 with medication adjustment.      Assessment: 83yom with EF 15% on Coumadin for AFib.  He presented to [facility] 3/31 with SOB and  found to have WCT.  His INR 3/31 was 1.8 he states his Coumadin was given to him at [facility] but I did not find documentation of this  in the shadow chart - only confirmation of other meds given to him  4/1.   He was transferred to [facility] 4/1.   Today he continues to have runs of VT and plan for VT ablation 4/3. INR 2.08 after Coumadin 5mg  x1 last pm, no bleeding noted.    Goal of Therapy:  INR 2-3 Monitor platelets by anticoagulation protocol: Yes   Plan:  Restart home dose Coumadin 5mg  MWF, 2.5mg  TTSS Daily INR  Leota Sauers Pharm.D. CPP, BCPS Clinical Pharmacist [phone] 11/05/2012 8:56 AM

## 2012-11-05 NOTE — Progress Notes (Addendum)
Pt back in v tach rhythm.  Pt asymptomatic.  B/p  98/64.  Awaiting lab work .  Publishing copy done

## 2012-11-05 NOTE — Progress Notes (Signed)
repaged cardiology on call.  Pt at this time going in and out of v-tach.  Continues asymptomatic.

## 2012-11-05 NOTE — Progress Notes (Signed)
Pt continues in and out of v tach.  C/o some sob.  Dr. Mayford Knife at bedside.  Called [employer] rep in to look at device.  Orders for lidocaine.

## 2012-11-06 ENCOUNTER — Encounter (HOSPITAL_COMMUNITY): Payer: Self-pay | Admitting: Anesthesiology

## 2012-11-06 ENCOUNTER — Encounter (HOSPITAL_COMMUNITY)
Admission: AD | Disposition: A | Discharge status: Home / Self Care | Payer: Self-pay | Source: Other Acute Inpatient Hospital | Attending: Internal Medicine

## 2012-11-06 ENCOUNTER — Inpatient Hospital Stay (HOSPITAL_COMMUNITY): Payer: Medicare Other | Admitting: Anesthesiology

## 2012-11-06 DIAGNOSIS — I472 Ventricular tachycardia: Secondary | ICD-10-CM

## 2012-11-06 DIAGNOSIS — I2510 Atherosclerotic heart disease of native coronary artery without angina pectoris: Secondary | ICD-10-CM

## 2012-11-06 HISTORY — PX: V-TACH ABLATION: SHX5498

## 2012-11-06 LAB — BASIC METABOLIC PANEL
BUN: 16 mg/dL (ref 6–23)
Chloride: 94 mEq/L — ABNORMAL LOW (ref 96–112)
Glucose, Bld: 85 mg/dL (ref 70–99)
Potassium: 3.8 mEq/L (ref 3.5–5.1)

## 2012-11-06 LAB — CBC
HCT: 34.2 % — ABNORMAL LOW (ref 39.0–52.0)
Hemoglobin: 11.9 g/dL — ABNORMAL LOW (ref 13.0–17.0)
MCHC: 34.8 g/dL (ref 30.0–36.0)
WBC: 4.8 10*3/uL (ref 4.0–10.5)

## 2012-11-06 LAB — PROTIME-INR: INR: 2.25 — ABNORMAL HIGH (ref 0.00–1.49)

## 2012-11-06 SURGERY — V-TACH ABLATION
Anesthesia: Monitor Anesthesia Care

## 2012-11-06 MED ORDER — BUPIVACAINE HCL (PF) 0.25 % IJ SOLN
INTRAMUSCULAR | Status: AC
  Filled 2012-11-06: qty 60

## 2012-11-06 MED ORDER — HYDROCODONE-ACETAMINOPHEN 5-325 MG PO TABS: 1.0000 | ORAL_TABLET | ORAL | Status: DC | PRN

## 2012-11-06 MED ORDER — ONDANSETRON HCL 4 MG/2ML IJ SOLN
INTRAMUSCULAR | Status: DC | PRN
  Administered 2012-11-06: 4 mg via INTRAVENOUS

## 2012-11-06 MED ORDER — SODIUM CHLORIDE 0.9 % IJ SOLN
3.0000 mL | Freq: Two times a day (BID) | INTRAMUSCULAR | Status: DC
  Administered 2012-11-07 (×2): 3 mL via INTRAVENOUS

## 2012-11-06 MED ORDER — SODIUM CHLORIDE 0.9 % IV BOLUS (SEPSIS)
500.0000 mL | Freq: Once | INTRAVENOUS | Status: AC
  Administered 2012-11-06: 500 mL via INTRAVENOUS

## 2012-11-06 MED ORDER — HEPARIN SODIUM (PORCINE) 1000 UNIT/ML IJ SOLN
INTRAMUSCULAR | Status: DC | PRN
  Administered 2012-11-06: 8000 [IU] via INTRAVENOUS

## 2012-11-06 MED ORDER — LACTATED RINGERS IV SOLN
INTRAVENOUS | Status: DC | PRN
  Administered 2012-11-06: 10:00:00 via INTRAVENOUS

## 2012-11-06 MED ORDER — SODIUM CHLORIDE 0.9 % IV SOLN
10.0000 mg | INTRAVENOUS | Status: DC | PRN
  Administered 2012-11-06: 50 ug/min via INTRAVENOUS

## 2012-11-06 MED ORDER — PHENYLEPHRINE HCL 10 MG/ML IJ SOLN
INTRAMUSCULAR | Status: DC | PRN
  Administered 2012-11-06: 80 ug via INTRAVENOUS
  Administered 2012-11-06: 120 ug via INTRAVENOUS

## 2012-11-06 MED ORDER — MIDAZOLAM HCL 5 MG/5ML IJ SOLN
INTRAMUSCULAR | Status: DC | PRN
  Administered 2012-11-06: 1 mg via INTRAVENOUS

## 2012-11-06 MED ORDER — SODIUM CHLORIDE 0.9 % IV SOLN: 250.0000 mL | INTRAVENOUS | Status: DC | PRN

## 2012-11-06 MED ORDER — ACETAMINOPHEN 10 MG/ML IV SOLN: 1000.0000 mg | Freq: Once | INTRAVENOUS | Status: DC | PRN

## 2012-11-06 MED ORDER — HEPARIN SODIUM (PORCINE) 1000 UNIT/ML IJ SOLN
INTRAMUSCULAR | Status: AC
  Filled 2012-11-06: qty 1

## 2012-11-06 MED ORDER — PROPOFOL INFUSION 10 MG/ML OPTIME
INTRAVENOUS | Status: DC | PRN
  Administered 2012-11-06: 100 ug/kg/min via INTRAVENOUS

## 2012-11-06 MED ORDER — ONDANSETRON HCL 4 MG/2ML IJ SOLN: 4.0000 mg | Freq: Once | INTRAMUSCULAR | Status: DC | PRN

## 2012-11-06 MED ORDER — ONDANSETRON HCL 4 MG/2ML IJ SOLN: 4.0000 mg | Freq: Four times a day (QID) | INTRAMUSCULAR | Status: DC | PRN

## 2012-11-06 MED ORDER — DEXMEDETOMIDINE HCL IN NACL 200 MCG/50ML IV SOLN
INTRAVENOUS | Status: DC | PRN
  Administered 2012-11-06: 0.5 ug/kg/h via INTRAVENOUS

## 2012-11-06 MED ORDER — FENTANYL CITRATE 0.05 MG/ML IJ SOLN
INTRAMUSCULAR | Status: DC | PRN
  Administered 2012-11-06: 50 ug via INTRAVENOUS

## 2012-11-06 MED ORDER — ACETAMINOPHEN 325 MG PO TABS: 650.0000 mg | ORAL_TABLET | ORAL | Status: DC | PRN

## 2012-11-06 MED ORDER — SODIUM CHLORIDE 0.9 % IJ SOLN: 3.0000 mL | INTRAMUSCULAR | Status: DC | PRN

## 2012-11-06 NOTE — Preoperative (Signed)
Beta Blockers   Reason not to administer Beta Blockers:Not Applicable 

## 2012-11-06 NOTE — Brief Op Note (Signed)
Ischemic VT ablation See dictation for full detials

## 2012-11-06 NOTE — Transfer of Care (Signed)
Immediate Anesthesia Transfer of Care Note  Patient: [name]  Procedure(s) Performed: Procedure(s): V-TACH ABLATION (N/A)  Patient Location: PACU  Anesthesia Type:MAC  Level of Consciousness: awake, alert  and oriented  Airway & Oxygen Therapy: Patient Spontanous Breathing and Patient connected to nasal cannula oxygen  Post-op Assessment: Report given to PACU RN, Post -op Vital signs reviewed and stable and Patient moving all extremities  Post vital signs: Reviewed and stable  Complications: No apparent anesthesia complications

## 2012-11-06 NOTE — Anesthesia Preprocedure Evaluation (Addendum)
Anesthesia Evaluation  Patient identified by MRN, date of birth, ID band Patient awake    Reviewed: Allergy & Precautions, H&P , NPO status , Patient's Chart, lab work & pertinent test results  Airway Mallampati: II TM Distance: >3 FB Neck ROM: full    Dental  (+) Dental Advidsory Given   Pulmonary COPD         Cardiovascular hypertension, On Home Beta Blockers + CAD, + Past MI, + Peripheral Vascular Disease and +CHF + dysrhythmias Atrial Fibrillation and Ventricular Tachycardia Rhythm:Regular Rate:Normal     Neuro/Psych    GI/Hepatic   Endo/Other  diabetes, Well Controlled, Type 2  Renal/GU      Musculoskeletal   Abdominal   Peds  Hematology  (+) anemia ,   Anesthesia Other Findings   Reproductive/Obstetrics                          Anesthesia Physical Anesthesia Plan  ASA: III  Anesthesia Plan: MAC   Post-op Pain Management:    Induction: Intravenous  Airway Management Planned: Natural Airway and Simple Face Mask  Additional Equipment:   Intra-op Plan:   Post-operative Plan:   Informed Consent: I have reviewed the patients History and Physical, chart, labs and discussed the procedure including the risks, benefits and alternatives for the proposed anesthesia with the patient or authorized representative who has indicated his/her understanding and acceptance.   Dental Advisory Given  Plan Discussed with: Anesthesiologist and CRNA  Anesthesia Plan Comments:        Anesthesia Quick Evaluation

## 2012-11-06 NOTE — Progress Notes (Signed)
ANTICOAGULATION CONSULT NOTE - Follow Up Consult  Pharmacy Consult for Coumadin Indication: atrial fibrillation  No Known Allergies  Patient Measurements: Height: 5\' 9"  (175.3 cm) Weight: 150 lb 2.1 oz (68.1 kg) IBW/kg (Calculated) : 70.7  Vital Signs: Temp: 98 F (36.7 C) (04/03 0400) Temp src: Oral (04/03 0400) BP: 114/71 mmHg (04/03 0700) Pulse Rate: 77 (04/03 0700)  Labs:  Recent Labs  11/05/12 0330 11/06/12 0533  HGB  --  11.9*  HCT  --  34.2*  PLT  --  205  LABPROT 22.5* 23.9*  INR 2.08* 2.25*  CREATININE 0.86 0.90    Estimated Creatinine Clearance: 62 ml/min (by C-G formula based on Cr of 0.9).  Assessment: 83yom continues on coumadin for afib with plans for VT ablation today. INR is therapeutic. Home dose of 5mg  MWF and 2.5mg  TTSS resumed yesterday. CBC stable. No bleeding reported.  Goal of Therapy:  INR 2-3 Monitor platelets by anticoagulation protocol: Yes   Plan:  1) Continue home dose - will receive 2.5mg  x 1 tonight 2) Follow up INR in AM  Fredrik Rigger 11/06/2012,9:14 AM

## 2012-11-06 NOTE — Anesthesia Postprocedure Evaluation (Signed)
  Anesthesia Post-op Note  Patient: [name]  Procedure(s) Performed: Procedure(s): V-TACH ABLATION (N/A)  Patient Location: Cath Lab  Anesthesia Type:MAC  Level of Consciousness: awake and alert   Airway and Oxygen Therapy: Patient Spontanous Breathing  Post-op Pain: mild  Post-op Assessment: Post-op Vital signs reviewed, Patient's Cardiovascular Status Stable, Respiratory Function Stable, Patent Airway, No signs of Nausea or vomiting and Pain level controlled  Post-op Vital Signs: stable  Complications: No apparent anesthesia complications

## 2012-11-06 NOTE — Progress Notes (Signed)
Patient: [name] Date of Encounter: 11/06/2012, 7:00 AM Admit date: 11/04/2012     Subjective  [name] has no new complaints. He has intermittent palpitations, SOB and chest pressure with VT.   Objective  Physical Exam: Vitals: BP 92/53  Pulse 78  Temp(Src) 98 F (36.7 C) (Oral)  Resp 22  Ht 5\' 9"  (1.753 m)  Wt 150 lb 2.1 oz (68.1 kg)  BMI 22.16 kg/m2  SpO2 100% General: Well developed 79 year old male in no acute distress. Neck: Supple. JVD not elevated. Lungs: Clear bilaterally to auscultation without wheezes, rales, or rhonchi. Breathing is unlabored. Heart: RRR S1 S2 without murmurs, rubs, or gallops.  Abdomen: Soft, non-distended. Extremities: No clubbing or cyanosis. No edema.  Distal pedal pulses are 2+ and equal bilaterally. Neuro: Alert and oriented X 3. Moves all extremities spontaneously. No focal deficits.  Intake/Output:  Intake/Output Summary (Last 24 hours) at 11/06/12 0700 Last data filed at 11/06/12 0600  Gross per 24 hour  Intake 1096.67 ml  Output   2700 ml  Net -1603.33 ml    Inpatient Medications:  . arformoterol  15 mcg Nebulization Q12H  . aspirin EC  81 mg Oral Daily  . budesonide  0.5 mg Nebulization BID  . cefTRIAXone (ROCEPHIN)  IV  1 g Intravenous Daily  . fluticasone  1 spray Each Nare Daily  . furosemide  40 mg Oral Daily  . metoprolol succinate  100 mg Oral Daily  . potassium chloride  10 mEq Oral Daily  . predniSONE  5 mg Oral Q breakfast  . simvastatin  20 mg Oral q1800  . sotalol  160 mg Oral Q12H  . spironolactone  25 mg Oral Daily  . warfarin  2.5 mg Oral Q T,Th,S,Su-1800  . warfarin  5 mg Oral Q M,W,F-1800  . Warfarin - Pharmacist Dosing Inpatient   Does not apply q1800   . sodium chloride 10 mL/hr (11/05/12 2120)    Labs:  Recent Labs  11/05/12 0330 11/06/12 0533  NA 133* 130*  K 3.3* 3.8  CL 96 94*  CO2 24 27  GLUCOSE 86 85  BUN 16 16  CREATININE 0.86 0.90  CALCIUM 9.0 9.3  MG 2.1  --      Recent Labs  11/06/12 0533  INR 2.25*    Radiology/Studies: No results found.   Telemetry: intermittent slow, monomorphic VT    Assessment and Plan  1. Slow, monomorphic VT  2. Known paroxysmal VT  3. Ischemic CM, dual chamber ICD in place  4. CAD s/p CABG, cath in Aug 2013 - stable CAD  5. Chronic systolic HF - not volume overloaded currently  6. PAF  7. PVD  [name] has symptomatic slow VT. This is quite frequent despite medical therapy. His ICD tachy therapy was reprogrammed - His VT-1 zone was lowered again and is programmed to ATP only. He will remain in the CCU and will continue AAD therapy for now. Dr. Johney Frame has arranged for VT ablation today. He will continue medical therapy for CAD and ICM. He does not appear volume overloaded currently.   Dr. Johney Frame to see.  Signed, EDMISTEN, BROOKE PA-C  I have seen, examined the patient, and reviewed the above assessment and plan.  Changes to above are made where necessary.  Therapeutic strategies for ventricular tachycardia including medicine and ablation were discussed in detail with the patient today. Risk, benefits, and alternatives to EP study and radiofrequency ablation were also discussed  in detail today. These risks include but are not limited to stroke, bleeding, vascular damage, tamponade, perforation, damage to the heart and other structures, AV block requiring pacemaker, worsening renal function, and death. The patient understands these risk and wishes to proceed.  We will therefore proceed with catheter ablation at this time.  Co Sign: Hillis Range, MD 11/06/2012 10:10 AM

## 2012-11-07 LAB — PROTIME-INR: INR: 2.35 — ABNORMAL HIGH (ref 0.00–1.49)

## 2012-11-07 MED ORDER — SODIUM CHLORIDE 0.9 % IV BOLUS (SEPSIS)
500.0000 mL | Freq: Once | INTRAVENOUS | Status: AC
  Administered 2012-11-07: 500 mL via INTRAVENOUS

## 2012-11-07 NOTE — Progress Notes (Addendum)
     Patient: [name] Date of Encounter: 11/07/2012, 6:56 AM Admit date: 11/04/2012     Subjective  [name] reports generalized weakness has no other complaints. He denies palpitations, SOB or chest pressure.   Objective  Physical Exam: Vitals: BP 91/43  Pulse 75  Temp(Src) 97.7 F (36.5 C) (Oral)  Resp 15  Ht 5\' 9"  (1.753 m)  Wt 150 lb 2.1 oz (68.1 kg)  BMI 22.16 kg/m2  SpO2 100% General: Well developed 79 year old male in no acute distress. Neck: Supple. JVD not elevated. Lungs: Clear bilaterally to auscultation without wheezes, rales, or rhonchi. Breathing is unlabored. Heart: RRR S1 S2 without murmurs, rubs, or gallops.  Abdomen: Soft, non-distended. Extremities: No clubbing or cyanosis. No edema.  Distal pedal pulses are 2+ and equal bilaterally. Neuro: Alert and oriented X 3. Moves all extremities spontaneously. No focal deficits.  Intake/Output:  Intake/Output Summary (Last 24 hours) at 11/07/12 0656 Last data filed at 11/07/12 0600  Gross per 24 hour  Intake   1830 ml  Output   1900 ml  Net    -70 ml    Inpatient Medications:  . arformoterol  15 mcg Nebulization Q12H  . budesonide  0.5 mg Nebulization BID  . cefTRIAXone (ROCEPHIN)  IV  1 g Intravenous Daily  . fluticasone  1 spray Each Nare Daily  . furosemide  40 mg Oral Daily  . metoprolol succinate  100 mg Oral Daily  . potassium chloride  10 mEq Oral Daily  . predniSONE  5 mg Oral Q breakfast  . simvastatin  20 mg Oral q1800  . sodium chloride  3 mL Intravenous Q12H  . sotalol  160 mg Oral Q12H  . spironolactone  25 mg Oral Daily  . warfarin  2.5 mg Oral Q T,Th,S,Su-1800  . warfarin  5 mg Oral Q M,W,F-1800  . Warfarin - Pharmacist Dosing Inpatient   Does not apply q1800    Labs:  Recent Labs  11/05/12 0330 11/06/12 0533  NA 133* 130*  K 3.3* 3.8  CL 96 94*  CO2 24 27  GLUCOSE 86 85  BUN 16 16  CREATININE 0.86 0.90  CALCIUM 9.0 9.3  MG 2.1  --     Recent Labs  11/07/12 0402    INR 2.35*    Radiology/Studies: No results found.   Telemetry: no further VT in last 24 hours; predominantly A paced V sensed with occasional PVCs    Assessment and Plan  1. Slow, monomorphic VT s/p EPS +RF ablation yesterday 2. Known paroxysmal VT  3. Ischemic CM, dual chamber ICD in place  4. CAD s/p CABG, cath in Aug 2013 - stable CAD  5. Chronic systolic HF - not volume overloaded currently  6. PAF  7. PVD  [name] is doing well s/p VT ablation yesterday. His rhythm is stable. Will transfer to stepdown, DC Foley and order PT consult for rehab and mobility needs (uses cane at home). He will continue medical therapy for CAD and ICM. He does not appear volume overloaded currently.   Dr. Johney Frame to see.  Signed, EDMISTEN, BROOKE PA-C   I have seen, examined the patient, and reviewed the above assessment and plan.  Changes to above are made where necessary.    Doing well s/p ablation.  No further VT.  Will transfer to telemetry.  If he remains stable hopefully home tomorrow.  Co Sign: Hillis Range, MD 11/07/2012 10:00 AM

## 2012-11-07 NOTE — Progress Notes (Deleted)
Admitted to [facility] hospital 3/31 for SOB found to have WCT EF 10% transferred to [facility] 4/1  Anticoagulation: INR 1.8 3/31 at [facility] - pt says warfarin given but not documented in shadow chart. Home dose 5mg  MWF, 2.5mg  TTSS - per Dr. Graciela Husbands do not hold warf for VT ablation INR 2.25 - continue home dose  Infectious Disease - at [facility] dirty UA, UCx neg Rocephin 3/31>> f/u LOT prob only needs 3 days  Cardiovascular: EF 10% furos, kcl, spiro, toprol, AFib/ VT has ICD sotalol dose inc, 4/2 mexilitine added, lidocaine drip 4/1(tried amio but sbp dropped to 87) plan to VT ablation today, HLD on simv, HR 70s SBPs 80-120  Endocrinology: cbg ok on bmet, on prednisone as pta  Gastrointestinal / Nutrition: NPO for ablation  Neurology: awake alert  Nephrology: sCr 0.90 (stable), lytes ok, on kdur 10 daily  Pulmonary: 98% 3L  Hematology / Oncology: CBC ok  PTA Medication Issues: valsartan  Best Practices: coumadin

## 2012-11-07 NOTE — Progress Notes (Signed)
ANTICOAGULATION CONSULT NOTE - Follow Up Consult  Pharmacy Consult for Coumadin Indication: atrial fibrillation  No Known Allergies  Patient Measurements: Height: 5\' 9"  (175.3 cm) Weight: 150 lb 2.1 oz (68.1 kg) IBW/kg (Calculated) : 70.7  Vital Signs: Temp: 97.6 F (36.4 C) (04/04 0800) Temp src: Oral (04/04 0800) BP: 95/52 mmHg (04/04 0700) Pulse Rate: 78 (04/04 0700)  Labs:  Recent Labs  11/05/12 0330 11/06/12 0533 11/07/12 0402  HGB  --  11.9*  --   HCT  --  34.2*  --   PLT  --  205  --   LABPROT 22.5* 23.9* 24.7*  INR 2.08* 2.25* 2.35*  CREATININE 0.86 0.90  --     Estimated Creatinine Clearance: 62 ml/min (by C-G formula based on Cr of 0.9).  Assessment: 79 yo M continues on coumadin for afib.  Pt is s/p VT ablation 4/3 without complications. INR is therapeutic on home dose of 5mg  MWF and 2.5mg  TTSS.  CBC stable. No bleeding reported.  Goal of Therapy:  INR 2-3 Monitor platelets by anticoagulation protocol: Yes   Plan:  1) Continue home dose - will receive 5mg  x 1 tonight 2) Follow up INR in AM  [employer], Pharm.D., BCPS Clinical Pharmacist Pager [phone] 11/07/2012 9:38 AM

## 2012-11-07 NOTE — Op Note (Signed)
[name], [name] NO.:  [id number]  MEDICAL RECORD NO.:  [id number]  LOCATION:  2910                         FACILITY:  [facility]  PHYSICIAN:  Hillis Range, MD       DATE OF BIRTH:  Apr 09, 1929  DATE OF PROCEDURE: DATE OF DISCHARGE:                              OPERATIVE REPORT   PRE-PROCEDURE DIAGNOSIS:  Ventricular tachycardia.  POSTPROCEDURE DIAGNOSIS:  Ischemic ventricular tachycardia.  PROCEDURES: 1. Comprehensive EP study. 2. Coronary sinus pacing and recording. 3. Left ventricular pacing and recording. 4. Three-dimensional mapping of ventricular tachycardia. 5. Radiofrequency ablation of ventricular tachycardia. 6. Arterial blood pressure monitoring. 7. Dual-chamber defibrillator interrogation and reprogramming.  INTRODUCTION:  [name] is a very pleasant 78 year old gentleman with a history of coronary artery disease and an ischemic cardiomyopathy.  He is status post prior ICD implantation.  He has recently developed sustained and recurrent ventricular tachycardia for which he has received frequent ICD, ATP, and shock therapies.  He has been treated medically with sotalol, Toprol, and mexiletine.  He has previously failed medical therapy with amiodarone.  His ventricular tachycardia has been documented to be a right bundle branch, slightly superior axis with a cycle length of 420 milliseconds.  He presents today for EP study and radiofrequency ablation.  DESCRIPTION OF PROCEDURE:  Informed written consent was obtained, and the patient was brought to the electrophysiology lab in the fasting state.  He was adequately sedated as outlined in the anesthesia report. The patient's right groin was prepped and draped in the usual sterile fashion by the EP lab staff.  Using a percutaneous Seldinger technique, two 6-French and one 8-French hemostasis sheaths were placed in the right common femoral vein.  An 8-French hemostasis sheath was also placed in the  right common femoral artery.  The patient's ICD was interrogated, and therapies were programmed off for the case today.  The patient presented to the electrophysiology lab in an atrial paced rhythm with an average RR interval of 770 milliseconds.  His PR interval measured 224 milliseconds with a QRS duration of 115 milliseconds and a QT interval of 428 milliseconds.  His AH interval measured 151 milliseconds with an HV interval of 44 milliseconds.  Ventricular pacing was performed, which revealed VA dissociation at baseline.  With ventricular pacing, the patient easily developed recurrent and eventually incessant ventricular tachycardia.  The morphology was identical to the previously described clinical tachycardia with a cycle length of 443 milliseconds.  The QRS duration during ventricular tachycardia measured 180 milliseconds.  The patient was then initially hemodynamically unstable, and tachycardia was terminated with ventricular pacing.  The patient then, however, developed again recurrent and incessant tachycardia.  With Neo-Synephrine administered by anesthesia, hemodynamic stability was achieved.  I therefore elected to perform three-dimensional electroanatomical mapping during ventricular tachycardia.  A 3.5 mm [employer] ablation catheter was advanced into the right common femoral artery and advanced into the left ventricle using a retrograde aortic approach. Heparin was administered intravenously and intra-arterially in order to maintain an ACT of greater than 300 seconds throughout the procedure. Three-dimensional activation mapping was performed during this tachycardia from the left ventricle.  This demonstrated that  the earliest activation occurred along the basal lateral portion of the left ventricle slightly inferiorly.  Voltage mapping was performed throughout the left ventricle.  This demonstrated that the patient had an inferolateral scar from  the base towards the distal 3rd of the left ventricle.  During tachycardia along the inferobasal lateral portion of the left ventricle fractionated activations were observed.  A presystolic potential was observed, which proceeded the surface QRS by 48 milliseconds.  A QS unipolar signal was also observed.  Entrainment was performed by pacing from the left ventricle.  This demonstrated a post pacing interval equal to the tachycardia cycle length, and therefore was felt to represent a portion of the tachycardia circuit. Radiofrequency current was therefore delivered in this location.  The tachycardia slowed and then terminated during ablation.  The tachycardia remained quiescent thereafter.  Additional voltage mapping was performed extensively throughout the left ventricle.  The inferolateral scar was confirmed.  I therefore elected to perform substrate modification along the scar.  This was extended from the inferobasal lateral portion of the left ventricle from the anterior to inferior approach along the lateral portion of the scar through the isthmus.  Additional substrate modification was also performed along the areas of extensive fractionation along this inferolateral scar.  Following ablation, ventricular pacing was performed down to a cycle length of 300 milliseconds with no arrhythmias observed.  With additional aggressive ventricular pacing down to a cycle length of 250 milliseconds, the patient developed a nonclinical ventricular tachycardia.  This was a right bundle-branch superior axis ventricular tachycardia with a cycle length of 270 milliseconds.  The patient was seen hemodynamically unstable during tachycardia, and it was, therefore, successfully defibrillated with a single 200 joule shock delivered with cardioversion electrodes in the anterior-posterior thoracic configuration.  He remained in sinus rhythm thereafter.  Ventricular extra stimulus testing was then performed  from the right ventricular apex with a basic cycle length of 500 milliseconds with S1, S2, S3, S4 extrastimuli down to refractoriness (500/320/260/210 milliseconds) with no tachycardias observed.  The patient was observed without any further ventricular tachycardia.  The procedure was therefore considered completed.  All catheters were removed, and the sheaths were aspirated and flushed.  The sheaths were removed, and hemostasis was assured.  The patient's defibrillator was interrogated.  This demonstrated that his [facility] Current VR RF ICD implanted June 18, 2007, was functioning normally with DDI pacing at 70 beats per minute with 2.5 years of estimated longevity.  Atrial and ventricular capture, sense, and lead impedance measurements were unchanged from prior to the procedure today. The tachycardia detections were turned back on as they were programmed prior to the beginning of the case today.  There were no early apparent complications.  CONCLUSIONS: 1. Atrial pacing upon presentation. 2. Easily inducible and incessant ventricular tachycardia arising from     the inferobasal lateral portion of the left ventricle successfully     ablated in this location. 3. Extensive scarring along the inferolateral portion of the left     ventricle with substrate modification performed with radiofrequency     current. 4. Clinical ventricular tachycardia (right bundle-branch slightly     superior axis with a cycle length of 440 milliseconds) no longer     inducible following the procedure today. 5. No early apparent complications.     Hillis Range, MD    JA/MEDQ  D:  11/06/2012  T:  11/07/2012  Job:  [id number]

## 2012-11-08 ENCOUNTER — Encounter (HOSPITAL_COMMUNITY): Payer: Self-pay | Admitting: Physician Assistant

## 2012-11-08 LAB — PROTIME-INR: Prothrombin Time: 23.7 seconds — ABNORMAL HIGH (ref 11.6–15.2)

## 2012-11-08 MED ORDER — SOTALOL HCL 160 MG PO TABS: 160.0000 mg | ORAL_TABLET | Freq: Two times a day (BID) | ORAL | Status: DC

## 2012-11-08 MED ORDER — METOPROLOL SUCCINATE ER 100 MG PO TB24: 100.0000 mg | ORAL_TABLET | Freq: Every day | ORAL | Status: DC

## 2012-11-08 NOTE — Progress Notes (Signed)
     Patient: [name] Date of Encounter: 11/08/2012, 10:09 AM Admit date: 11/04/2012     Subjective  [name]  denies palpitations, SOB or chest pressure.   Objective  Physical Exam: Vitals: BP 99/53  Pulse 76  Temp(Src) 98.4 F (36.9 C) (Oral)  Resp 18  Ht 5\' 9"  (1.753 m)  Wt 149 lb 11.1 oz (67.9 kg)  BMI 22.1 kg/m2  SpO2 95% General: Well developed 79 year old male in no acute distress. Neck: Supple. JVD not elevated. Lungs: Clear bilaterally to auscultation without wheezes, rales, or rhonchi. Breathing is unlabored. Heart: RRR S1 S2 without murmurs, rubs, or gallops.  Abdomen: Soft, non-distended. Extremities: No clubbing or cyanosis. No edema.  Distal pedal pulses are 2+ and equal bilaterally. Neuro: Alert and oriented X 3. Moves all extremities spontaneously. No focal deficits.  Intake/Output:  Intake/Output Summary (Last 24 hours) at 11/08/12 1009 Last data filed at 11/08/12 0614  Gross per 24 hour  Intake    240 ml  Output    975 ml  Net   -735 ml    Inpatient Medications:  . arformoterol  15 mcg Nebulization Q12H  . budesonide  0.5 mg Nebulization BID  . cefTRIAXone (ROCEPHIN)  IV  1 g Intravenous Daily  . fluticasone  1 spray Each Nare Daily  . furosemide  40 mg Oral Daily  . metoprolol succinate  100 mg Oral Daily  . potassium chloride  10 mEq Oral Daily  . predniSONE  5 mg Oral Q breakfast  . simvastatin  20 mg Oral q1800  . sodium chloride  3 mL Intravenous Q12H  . sotalol  160 mg Oral Q12H  . spironolactone  25 mg Oral Daily  . warfarin  2.5 mg Oral Q T,Th,S,Su-1800  . warfarin  5 mg Oral Q M,W,F-1800  . Warfarin - Pharmacist Dosing Inpatient   Does not apply q1800    Labs:  Recent Labs  11/06/12 0533  NA 130*  K 3.8  CL 94*  CO2 27  GLUCOSE 85  BUN 16  CREATININE 0.90  CALCIUM 9.3    Recent Labs  11/08/12 0536  INR 2.23*    Radiology/Studies: No results found.   Telemetry: no VT in last 24 hours; predominantly A paced  V sensed with occasional PVCs    Assessment and Plan  1. Slow, monomorphic VT s/p EPS +RF ablation without recurrence Stop mixelitine Continue sotalol 160mg  BID Continue toprol XL ICD interrogated this am and reveals no VT since ablation 2. Ischemic CM, dual chamber ICD in place  euvolemic 3. CAD s/p CABG, cath in Aug 2013 - stable CAD  4. Chronic systolic HF - not volume overloaded currently  5. PAF on warfarin Resume home regimen Needs INR check in 4 weeks 6. PVD   DC to home today Follow-up with me in the [facility] device clinic in 4 weeks   Hillis Range, MD 11/08/2012 10:09 AM

## 2012-11-08 NOTE — Progress Notes (Signed)
PT Cancellation Note  Patient Details Name: [name] MRN: [id number] DOB: 01-04-1929   Cancelled Treatment:    Reason Eval/Treat Not Completed: Other (comment) (Pt stated he is at baseline and MD canceled order this am.)   INGOLD,[name] 11/08/2012, 3:36 PM [facility] [phone] (pager)

## 2012-11-08 NOTE — Discharge Summary (Signed)
Discharge Summary   Patient ID: [name], MRN: [id number], DOB/AGE: 1929/05/12 79 y.o.  Admit date: 11/04/2012 Discharge date: 11/08/2012   Primary Care Physician:  FAOZHY,QMVHQ B   Primary Cardiologist:  Dr. Hillis Range  Primary Electrophysiologist:  Dr. Hillis Range   Reason for Admission:  Slow, Monomorphic Ventricular Tachycardia  Primary Discharge Diagnoses:  1. Slow, monomorphic VT - s/p EPS +RF ablation this admission without recurrence  2. NSTEMI - related to demand ischemia in setting of VTach 3. Ischemic Cardiomyopathy 4. Dual chamber ICD in place  5. CAD s/p CABG - cath in Aug 2013 with stable CAD  6. Chronic systolic CHF   7. PAFib on warfarin  8. PVD    Secondary Discharge Diagnoses:   Past Medical History  Diagnosis Date  . Hypertension   . Coronary artery disease     a. CABG 03/2006. b. NSTEMI 03/2012 following VT likely demand ischemia - grafts patent at cath.  . Diabetes mellitus   . Other specified forms of chronic ischemic heart disease   . Syncope and collapse   . Automatic implantable cardiac defibrillator in situ     QRS duration 120 ms no typical left bundle branch block.  . Atrial fibrillation     Taking sotalol, intolerant to amiodarone  . Valvular heart disease     Mild to moderate mitral valve insufficiency and mild aortic insufficiency  . Chronic airway obstruction, not elsewhere classified   . Old myocardial infarction   . Other and unspecified hyperlipidemia   . Congestive heart failure, unspecified     a. Ischemic cardiomyopathy ejection fraction 30-35% previously. b. Down to 15% by cath 03/2012.  [name] Cerebrovascular disease, unspecified   . Other mechanical complication of other internal orthopedic device, implant, and graft   . Carotid artery disease     s/p LCEA 2007.  [name] Head and neck cancer     Receiving radiation therapy.  . Orthostatic hypotension   . Ventricular tachycardia     a. S/p ICD implantation. b. Recurrent in 03/2012  with medication adjustment.;  c. s/p RFCA 11/2012 (Dr. Fawn Kirk)      Allergies:   No Known Allergies    Procedures Performed This Admission 11/07/12:    1. Comprehensive EP study.  2. Coronary sinus pacing and recording.  3. Left ventricular pacing and recording.  4. Three-dimensional mapping of ventricular tachycardia.  5. Radiofrequency ablation of ventricular tachycardia.  6. Arterial blood pressure monitoring.  7. Dual-chamber defibrillator interrogation and reprogramming.   Hospital Course:  [name] is a 79 y.o. male with a hx of severe ischemic cardiomyopathy (EF of 15%), remote CABG, and recent episode of ventricular tachycardia (10/15/2012) in the setting of hypokalemia (2.9) successfully converted with a single shock of 25 joules after two unsuccessful ATP attempts. He is maintained on sotalol and mexiletine. The patient was recently seen in the [facility] clinic for a routine EP followup on 10/22/2012 at which time Dr. Johney Frame made no further adjustments of his device. Recommendation was to replete potassium and the patient was advised to refrain from driving for 6 months.  The patient initially presented to [facility] with complaints of dyspnea after having experienced nausea, vomiting and diarrhea the previous evening. He had no evidence of edema on chest x-ray. BNP was markedly elevated. Patient subsequently developed multiple successive salvos of wide complex tachycardia approximately 120 bpm.  Troponins were elevated (0.77, 0.57).  Potassium levels remained normal.  He was transferred to [facility] for further evaluation and therapy.  He was seen by Dr. Johney Frame. His wide-complex tachycardia was felt to represent slow, monomorphic VT which was below his VT detection zone. His device was interrogated and was functioning normally. His device was reprogrammed (VT zone decreased to 120 bpm and ATP only therapies programmed).  His mexiletine was increased.  He had more episodes of  ventricular tachycardia at a rate of 118 which were not paced terminated. Potassium was low and was repleted.  He was placed on IV lidocaine and his detection rate was turned down to 115.  The patient subsequently underwent RFCA of his VT by Dr. Johney Frame 11/08/11.  He remained stable after his procedure. He had no further ventricular tachycardia. He was seen by Dr. Johney Frame this morning. Mexiletine will be stopped. He will continue on sotalol 160 mg twice a day and Toprol. His volume remained stable. His normal home dose of Coumadin will be resumed and his INR will be checked in 4 weeks. He is felt stable for discharge to home by Dr. Johney Frame.    Discharge Vitals: Blood pressure 90/55, pulse 78, temperature 98.1 F (36.7 C), temperature source Oral, resp. rate 20, height 5\' 9"  (1.753 m), weight 149 lb 11.1 oz (67.9 kg), SpO2 95.00%.  Labs:  Recent Labs  11/06/12 0533  WBC 4.8  HGB 11.9*  HCT 34.2*  MCV 87.2  PLT 205    Recent Labs  11/06/12 0533  NA 130*  K 3.8  CL 94*  CO2 27  BUN 16  CREATININE 0.90  CALCIUM 9.3     Recent Labs  11/06/12 0533 11/07/12 0402 11/08/12 0536  INR 2.25* 2.35* 2.23*    Disposition:   Pt is being discharged home today in good condition.  Follow-up Plans & Appointments      Follow-up Information   Follow up with Hillis Range, MD In 4 weeks. (office will call you for an appointment)    Contact information:   [address] [place] [address] [phone]        Follow up with Terral CARD MOREHEAD In 4 weeks. (you will have your coumadin checked)    Contact information:   [address] [place] [address]       Discharge Medications    Medication List    STOP taking these medications       mexiletine 200 MG capsule  Commonly known as:  MEXITIL     valsartan 80 MG tablet  Commonly known as:  DIOVAN      TAKE these medications       acetaminophen 500 MG tablet  Commonly known as:  TYLENOL  Take 1,000 mg  by mouth every 6 (six) hours as needed. For pain     ALDACTONE 25 MG tablet  Generic drug:  spironolactone  TAKE 1 TABLET ONCE DAILY.     budesonide 0.5 MG/2ML nebulizer solution  Commonly known as:  PULMICORT  Take 0.5 mg by nebulization 2 (two) times daily.     FLONASE 50 MCG/ACT nasal spray  Generic drug:  fluticasone  USE (2) SPRAYS IN EACH NOSTRIL ONCE DAILY.     formoterol 20 MCG/2ML nebulizer solution  Commonly known as:  PERFOROMIST  Take 20 mcg by nebulization 2 (two) times daily.     furosemide 40 MG tablet  Commonly known as:  LASIX  TAKE 1 & 1/2 TABLET DAILY.     loratadine 10 MG tablet  Commonly  known as:  CLARITIN  Take 10 mg by mouth daily. For allergies     megestrol 40 MG/ML suspension  Commonly known as:  MEGACE  Take 2 teaspoonfuls (10ml) by mouth daily     metoprolol succinate 100 MG 24 hr tablet  Commonly known as:  TOPROL-XL  Take 1 tablet (100 mg total) by mouth daily.     nitroGLYCERIN 0.4 MG SL tablet  Commonly known as:  NITROSTAT  Place 1 tablet (0.4 mg total) under the tongue every 5 (five) minutes as needed for chest pain.     omeprazole 20 MG capsule  Commonly known as:  PRILOSEC  Take 20 mg by mouth daily.     potassium chloride 10 MEQ tablet  Commonly known as:  K-DUR  Take 10 mEq by mouth daily.     predniSONE 5 MG tablet  Commonly known as:  DELTASONE  Take 5 mg by mouth daily.     simvastatin 40 MG tablet  Commonly known as:  ZOCOR  Take 40 mg by mouth at bedtime.     sodium fluoride 1.1 % Gel dental gel  Commonly known as:  FLUORISHIELD  Place 1 application onto teeth at bedtime. Cal Dr. Everardo Beals in [place], [place] for future refills.     sotalol 160 MG tablet  Commonly known as:  BETAPACE  Take 1 tablet (160 mg total) by mouth every 12 (twelve) hours.     warfarin 5 MG tablet  Commonly known as:  COUMADIN  Take 2.5-5 mg by mouth daily. Mondays, Wednesdays and Fridays take 5mg  and rest of days take half of the 5mg  tablet  (2.5mg )         Outstanding Labs/Studies  1. PT/INR in 4 weeks at follow up visit with Dr. Hillis Range   Duration of Discharge Encounter: Greater than 30 minutes including physician and PA time.  Luna Glasgow, PA-C  4:42 PM 11/08/2012    Hillis Range, MD

## 2012-11-08 NOTE — Progress Notes (Signed)
Pt discharge by nurse tech, Dorann Lodge, stated he understood his medications and discharge instructions, no further questions, and patient instructed to pick up medication sotalol from [facility] for tonight and tomorrow doses, Pt and wife in agreement.

## 2012-11-08 NOTE — Progress Notes (Signed)
ANTICOAGULATION CONSULT NOTE - Follow Up Consult  Pharmacy Consult for Warfarin Indication: atrial fibrillation  No Known Allergies  Patient Measurements: Height: 5\' 9"  (175.3 cm) Weight: 149 lb 11.1 oz (67.9 kg) IBW/kg (Calculated) : 70.7  Vital Signs: Temp: 98.4 F (36.9 C) (04/05 0609) Temp src: Oral (04/05 0609) BP: 99/53 mmHg (04/05 0610) Pulse Rate: 76 (04/05 0610)  Labs:  Recent Labs  11/06/12 0533 11/07/12 0402 11/08/12 0536  HGB 11.9*  --   --   HCT 34.2*  --   --   PLT 205  --   --   LABPROT 23.9* 24.7* 23.7*  INR 2.25* 2.35* 2.23*  CREATININE 0.90  --   --     Estimated Creatinine Clearance: 61.8 ml/min (by C-G formula based on Cr of 0.9).   Medications:  Scheduled:  . arformoterol  15 mcg Nebulization Q12H  . budesonide  0.5 mg Nebulization BID  . cefTRIAXone (ROCEPHIN)  IV  1 g Intravenous Daily  . fluticasone  1 spray Each Nare Daily  . furosemide  40 mg Oral Daily  . metoprolol succinate  100 mg Oral Daily  . potassium chloride  10 mEq Oral Daily  . predniSONE  5 mg Oral Q breakfast  . simvastatin  20 mg Oral q1800  . sodium chloride  3 mL Intravenous Q12H  . sotalol  160 mg Oral Q12H  . spironolactone  25 mg Oral Daily  . warfarin  2.5 mg Oral Q T,Th,S,Su-1800  . warfarin  5 mg Oral Q M,W,F-1800  . Warfarin - Pharmacist Dosing Inpatient   Does not apply q1800    Assessment: 79 y/o M who is on chronic warfarin therapy for a-fib. INR is 2.23<2.35 on home dose of warfarin. Hgb 11.9 from 4/3, no overt bleeding noted. Scr 0.90 with CrCl ~ 59ml/min.  PTA warfarin dose: 5mg  Mon/Wed/Fri and 2.5mg  all other days  Goal of Therapy:  INR 2-3 Monitor platelets by anticoagulation protocol: Yes   Plan:  -Continue warfarin home dose as above -Daily PT/INR -Monitor for bleeding  Abran Duke, PharmD Clinical Pharmacist Phone: [phone] Pager: [phone] 11/08/2012 9:07 AM

## 2012-11-17 ENCOUNTER — Encounter: Payer: Self-pay | Admitting: Cardiology

## 2012-12-09 ENCOUNTER — Ambulatory Visit (INDEPENDENT_AMBULATORY_CARE_PROVIDER_SITE_OTHER): Payer: Medicare Other | Admitting: *Deleted

## 2012-12-09 DIAGNOSIS — Z7901 Long term (current) use of anticoagulants: Secondary | ICD-10-CM

## 2012-12-09 DIAGNOSIS — I4891 Unspecified atrial fibrillation: Secondary | ICD-10-CM

## 2012-12-09 LAB — POCT INR: INR: 2.6

## 2012-12-16 ENCOUNTER — Other Ambulatory Visit: Payer: Self-pay | Admitting: Cardiology

## 2012-12-19 ENCOUNTER — Ambulatory Visit: Payer: Self-pay | Admitting: Cardiology

## 2012-12-23 ENCOUNTER — Ambulatory Visit (INDEPENDENT_AMBULATORY_CARE_PROVIDER_SITE_OTHER): Payer: Medicare Other | Admitting: *Deleted

## 2012-12-23 DIAGNOSIS — I4891 Unspecified atrial fibrillation: Secondary | ICD-10-CM

## 2012-12-23 DIAGNOSIS — Z7901 Long term (current) use of anticoagulants: Secondary | ICD-10-CM

## 2012-12-24 ENCOUNTER — Ambulatory Visit (INDEPENDENT_AMBULATORY_CARE_PROVIDER_SITE_OTHER): Payer: Medicare Other | Admitting: Cardiology

## 2012-12-24 ENCOUNTER — Encounter: Payer: Self-pay | Admitting: Cardiology

## 2012-12-24 VITALS — BP 115/70 | HR 76 | Ht 69.0 in | Wt 151.8 lb

## 2012-12-24 DIAGNOSIS — I10 Essential (primary) hypertension: Secondary | ICD-10-CM

## 2012-12-24 DIAGNOSIS — I472 Ventricular tachycardia: Secondary | ICD-10-CM

## 2012-12-24 DIAGNOSIS — I214 Non-ST elevation (NSTEMI) myocardial infarction: Secondary | ICD-10-CM

## 2012-12-24 DIAGNOSIS — I2510 Atherosclerotic heart disease of native coronary artery without angina pectoris: Secondary | ICD-10-CM

## 2012-12-24 DIAGNOSIS — I2589 Other forms of chronic ischemic heart disease: Secondary | ICD-10-CM

## 2012-12-24 DIAGNOSIS — R0602 Shortness of breath: Secondary | ICD-10-CM

## 2012-12-24 MED ORDER — METOPROLOL SUCCINATE ER 50 MG PO TB24: 50.0000 mg | ORAL_TABLET | Freq: Every day | ORAL | Status: DC

## 2012-12-24 NOTE — Progress Notes (Signed)
HPI The patient presents for followup of his ischemic cardiomyopathy and ventricular tachycardia. Since I last saw him he had VT ablation. During that hospitalization he was taken off of mexiletine. Apparently his beta blocker dose was increased as well.  Unfortunately he's continued to feel poorly. He has dyspnea which may or may not be worse than previous. He's not describing any new PND or orthopnea. He's not having any weight gain and in fact he has losing weight.   He's had no further  symptomatic tachycardia or firing of hs ICD.   His biggest problem continues to be fatigued which she thinks is worse since having his beta blocker increased.   He denies any chest pressure, neck or arm discomfort.  No Known Allergies  Current Outpatient Prescriptions  Medication Sig Dispense Refill  . ALDACTONE 25 MG tablet TAKE 1 TABLET ONCE DAILY.  30 each  6  . budesonide (PULMICORT) 0.5 MG/2ML nebulizer solution Take 0.5 mg by nebulization 2 (two) times daily.      [name] FLONASE 50 MCG/ACT nasal spray USE (2) SPRAYS IN EACH NOSTRIL ONCE DAILY.  16 g  2  . formoterol (PERFOROMIST) 20 MCG/2ML nebulizer solution Take 20 mcg by nebulization 2 (two) times daily.      . furosemide (LASIX) 40 MG tablet TAKE 1 & 1/2 TABLET DAILY.  45 tablet  2  . loratadine (CLARITIN) 10 MG tablet Take 10 mg by mouth daily. For allergies      . megestrol (MEGACE) 40 MG/ML suspension Take by mouth as needed. Take 2 teaspoonfuls (10ml) by mouth daily      . metoprolol succinate (TOPROL-XL) 100 MG 24 hr tablet Take 1 tablet (100 mg total) by mouth daily.  30 tablet  6  . nitroGLYCERIN (NITROSTAT) 0.4 MG SL tablet Place 1 tablet (0.4 mg total) under the tongue every 5 (five) minutes as needed for chest pain.  25 each  3  . omeprazole (PRILOSEC) 20 MG capsule Take 20 mg by mouth daily.      . predniSONE (DELTASONE) 5 MG tablet Take 5 mg by mouth daily.       . simvastatin (ZOCOR) 40 MG tablet Take 40 mg by mouth at bedtime.        .  sotalol (BETAPACE) 160 MG tablet Take 1 tablet (160 mg total) by mouth every 12 (twelve) hours.  60 tablet  6  . warfarin (COUMADIN) 5 MG tablet Take as directed by coumadin clinic  30 tablet  1   No current facility-administered medications for this visit.    Past Medical History  Diagnosis Date  . Hypertension   . Coronary artery disease     a. CABG 03/2006. b. NSTEMI 03/2012 following VT likely demand ischemia - grafts patent at cath.  . Diabetes mellitus   . Other specified forms of chronic ischemic heart disease   . Syncope and collapse   . Automatic implantable cardiac defibrillator in situ     QRS duration 120 ms no typical left bundle branch block.  . Atrial fibrillation     Taking sotalol, intolerant to amiodarone  . Valvular heart disease     Mild to moderate mitral valve insufficiency and mild aortic insufficiency  . Chronic airway obstruction, not elsewhere classified   . Old myocardial infarction   . Other and unspecified hyperlipidemia   . Congestive heart failure, unspecified     a. Ischemic cardiomyopathy ejection fraction 30-35% previously. b. Down to 15% by cath 03/2012.  [name]  Cerebrovascular disease, unspecified   . Other mechanical complication of other internal orthopedic device, implant, and graft   . Carotid artery disease     s/p LCEA 2007.  [name] Head and neck cancer     Receiving radiation therapy.  . Orthostatic hypotension   . Ventricular tachycardia     a. S/p ICD implantation. b. Recurrent in 03/2012 with medication adjustment.;  c. s/p RFCA 11/2012 (Dr. Fawn Kirk)     Past Surgical History  Procedure Laterality Date  . Repeat otif left knee with figure-of-eight tension band.    . Implantation of a dual-chamber implantable  06/18/2007  . Left carotid endarterectomy    . Coronary artery bypass graft  04/04/2006    ROS:  As stated in the HPI and negative for all other systems.  PHYSICAL EXAM BP 115/70  Pulse 76  Ht 5\' 9"  (1.753 m)  Wt 151 lb 12.8 oz (68.856  kg)  BMI 22.41 kg/m2 GENERAL:  Frail appearing HEENT:  Pupils equal round and reactive, fundi not visualized, oral mucosa unremarkable NECK:  No jugular venous distention, waveform within normal limits, carotid upstroke brisk and symmetric, no bruits, no thyromegaly LUNGS:  Clear to auscultation bilaterally BACK:  No CVA tenderness CHEST:  Well healed ICD pocket HEART:  PMI not displaced or sustained,S1 and S2 within normal limits, no S3,  no clicks, no rubs, no murmurs, irregular ABD:  Flat, positive bowel sounds normal in frequency in pitch, no bruits, no rebound, no guarding, no midline pulsatile mass, no hepatomegaly, no splenomegaly EXT:  2 plus pulses throughout, mild ankle edema, no cyanosis no clubbing, missing the fingers on the right hand   ASSESSMENT AND PLAN  Coronary artery disease   I do not think current symptoms are related to ischemia.  No further cardiovascular testing is indicated.  We will continue with aggressive risk reduction and meds as listed.  ISCHEMIC CARDIOMYOPATHY  It is hard to know whether any of his symptoms are related to low output failure. I reviewed the results of the last echo and his EF was about 30% but this was more than a year ago. I will repeat an echocardiogram and obtain a BNP level.   For now I will continue the medicines as listed.  Atrial fibrillation  This is listed by Dr. Johney Frame as paroxysmal.  I do not know the afib burden and he will remain on  Anticoagulation and sotalol.  VENTRICULAR TACHYCARDIA   he has had no symptomatic recurrence of this since ablation.  Fatigue He seems to be worse since increasing the beta blocker. Therefore, I will go ahead and reduce this back to 50 mg daily.

## 2012-12-24 NOTE — Patient Instructions (Addendum)
   Echo  Lab for BNP - today  Office will contact with results  Decrease Metoprolol Succ to 50mg  daily Continue all other current medications. Follow up in  1 month

## 2012-12-25 ENCOUNTER — Encounter: Payer: Self-pay | Admitting: Internal Medicine

## 2012-12-25 ENCOUNTER — Ambulatory Visit (INDEPENDENT_AMBULATORY_CARE_PROVIDER_SITE_OTHER): Payer: Medicare Other | Admitting: Internal Medicine

## 2012-12-25 ENCOUNTER — Telehealth: Payer: Self-pay | Admitting: *Deleted

## 2012-12-25 VITALS — BP 123/76 | HR 82 | Ht 69.0 in | Wt 149.8 lb

## 2012-12-25 DIAGNOSIS — I519 Heart disease, unspecified: Secondary | ICD-10-CM | POA: Insufficient documentation

## 2012-12-25 DIAGNOSIS — R799 Abnormal finding of blood chemistry, unspecified: Secondary | ICD-10-CM

## 2012-12-25 DIAGNOSIS — I2589 Other forms of chronic ischemic heart disease: Secondary | ICD-10-CM

## 2012-12-25 DIAGNOSIS — I4891 Unspecified atrial fibrillation: Secondary | ICD-10-CM

## 2012-12-25 DIAGNOSIS — R7989 Other specified abnormal findings of blood chemistry: Secondary | ICD-10-CM

## 2012-12-25 DIAGNOSIS — I472 Ventricular tachycardia, unspecified: Secondary | ICD-10-CM

## 2012-12-25 LAB — ICD DEVICE OBSERVATION
AL IMPEDENCE ICD: 350 Ohm
AL THRESHOLD: 1 V
ATRIAL PACING ICD: 93 pct
BAMS-0001: 150 {beats}/min
BATTERY VOLTAGE: 2.5718 V
CHARGE TIME: 14 s
DEV-0020ICD: NEGATIVE
MODE SWITCH EPISODES: 0
RV LEAD THRESHOLD: 1.5 V
TOT-0006: 20081112000000
TOT-0007: 1
TOT-0010: 52
TZAT-0004FASTVT: 8
TZAT-0004SLOWVT: 8
TZAT-0012FASTVT: 200 ms
TZAT-0013FASTVT: 1
TZAT-0013SLOWVT: 6
TZAT-0018FASTVT: NEGATIVE
TZAT-0018SLOWVT: NEGATIVE
TZAT-0019FASTVT: 7.5 V
TZON-0003FASTVT: 325 ms
TZON-0003SLOWVT: 545 ms
TZON-0004FASTVT: 24
TZON-0010FASTVT: 80 ms
TZST-0001FASTVT: 3
TZST-0001FASTVT: 4
TZST-0003FASTVT: 36 J
TZST-0003FASTVT: 36 J

## 2012-12-25 MED ORDER — SOTALOL HCL 120 MG PO TABS: 120.0000 mg | ORAL_TABLET | Freq: Two times a day (BID) | ORAL | Status: DC

## 2012-12-25 NOTE — Patient Instructions (Signed)
   Decrease Sotalol to 120mg  twice a day  - new sent to pharm Continue all other current medications. Labs for BMET - due around June 2 Office will contact with results  Follow up in  3 months

## 2012-12-25 NOTE — Progress Notes (Signed)
PCP:  Louie Boston, MD Primary Cardiologist:  Dr Antoine Poche  The patient presents today for routine electrophysiology followup.  Since his recent VT ablation, the patient reports doing very well.  He has had no further symptoms of VT.  He reports fatigue frequently with decreased exercise tolerance.  He was recently seen by Dr Antoine Poche and his Toprol was decreased.  Today, he denies symptoms of palpitations, chest pain, shortness of breath, orthopnea, PND, lower extremity edema, dizziness, presyncope, syncope, or neurologic sequela.  The patient feels that he is tolerating medications without difficulties and is otherwise without complaint today.   Past Medical History  Diagnosis Date  . Hypertension   . Coronary artery disease     a. CABG 03/2006. b. NSTEMI 03/2012 following VT likely demand ischemia - grafts patent at cath.  . Diabetes mellitus   . Other specified forms of chronic ischemic heart disease   . Syncope and collapse   . Automatic implantable cardiac defibrillator in situ     QRS duration 120 ms no typical left bundle branch block.  . Atrial fibrillation     Taking sotalol, intolerant to amiodarone  . Valvular heart disease     Mild to moderate mitral valve insufficiency and mild aortic insufficiency  . Chronic airway obstruction, not elsewhere classified   . Old myocardial infarction   . Other and unspecified hyperlipidemia   . Congestive heart failure, unspecified     a. Ischemic cardiomyopathy ejection fraction 30-35% previously. b. Down to 15% by cath 03/2012.  [name] Cerebrovascular disease, unspecified   . Other mechanical complication of other internal orthopedic device, implant, and graft   . Carotid artery disease     s/p LCEA 2007.  [name] Head and neck cancer     Receiving radiation therapy.  . Orthostatic hypotension   . Ventricular tachycardia     a. S/p ICD implantation. b. Recurrent in 03/2012 with medication adjustment.;  c. s/p RFCA 11/2012 (Dr. Fawn Kirk)    Past  Surgical History  Procedure Laterality Date  . Repeat otif left knee with figure-of-eight tension band.    . Implantation of a dual-chamber implantable  06/18/2007  . Left carotid endarterectomy    . Coronary artery bypass graft  04/04/2006  . Vt ablation  11/06/12    Ischemic VT ablation by Dr Johney Frame    Current Outpatient Prescriptions  Medication Sig Dispense Refill  . ALDACTONE 25 MG tablet TAKE 1 TABLET ONCE DAILY.  30 each  6  . budesonide (PULMICORT) 0.5 MG/2ML nebulizer solution Take 0.5 mg by nebulization 2 (two) times daily.      [name] FLONASE 50 MCG/ACT nasal spray USE (2) SPRAYS IN EACH NOSTRIL ONCE DAILY.  16 g  2  . formoterol (PERFOROMIST) 20 MCG/2ML nebulizer solution Take 20 mcg by nebulization 2 (two) times daily.      . furosemide (LASIX) 40 MG tablet TAKE 1 & 1/2 TABLET DAILY.  45 tablet  2  . loratadine (CLARITIN) 10 MG tablet Take 10 mg by mouth daily. For allergies      . megestrol (MEGACE) 40 MG/ML suspension Take by mouth as needed. Take 2 teaspoonfuls (10ml) by mouth daily      . metoprolol succinate (TOPROL-XL) 50 MG 24 hr tablet Take 1 tablet (50 mg total) by mouth daily.  30 tablet  6  . nitroGLYCERIN (NITROSTAT) 0.4 MG SL tablet Place 1 tablet (0.4 mg total) under the tongue every 5 (five) minutes as needed for chest pain.  25 each  3  . omeprazole (PRILOSEC) 20 MG capsule Take 20 mg by mouth daily.      . predniSONE (DELTASONE) 5 MG tablet Take 5 mg by mouth daily.       . simvastatin (ZOCOR) 40 MG tablet Take 40 mg by mouth at bedtime.        . sotalol (BETAPACE) 120 MG tablet Take 1 tablet (120 mg total) by mouth 2 (two) times daily.  60 tablet  6  . warfarin (COUMADIN) 5 MG tablet Take as directed by coumadin clinic  30 tablet  1   No current facility-administered medications for this visit.    No Known Allergies  History   Social History  . Marital Status: Married    Spouse Name: N/A    Number of Children: N/A  . Years of Education: N/A    Occupational History  . RETIRED    Social History Main Topics  . Smoking status: Former Smoker -- 2.00 packs/day for 60 years    Types: Cigarettes    Quit date: 08/06/1990  . Smokeless tobacco: Never Used  . Alcohol Use: No  . Drug Use: No  . Sexually Active: Not on file   Other Topics Concern  . Not on file   Social History Narrative   Pt does not get regular exercise.    No family history on file.  ROS-  All systems are reviewed and are negative except as outlined in the HPI above  Physical Exam: Filed Vitals:   12/25/12 1526  BP: 123/76  Pulse: 82  Height: 5\' 9"  (1.753 m)  Weight: 149 lb 12.8 oz (67.949 kg)    GEN- The patient is well appearing, alert and oriented x 3 today.   Head- normocephalic, atraumatic Eyes-  Sclera clear, conjunctiva pink Ears- hearing intact Oropharynx- clear Neck- supple, no JVP Lungs- Clear to ausculation bilaterally, normal work of breathing Heart- Regular rate and rhythm, no murmurs, rubs or gallops, PMI not laterally displaced GI- soft, NT, ND, + BS Extremities- no clubbing, cyanosis, or edema  ekg today reveals sinus rhythm 82 bpm, IVCD, Qtc 528 ICD interrogation today is reviewed  Assessment and Plan:  1. VT Doing well since his VT ablation Normal ICD function He did have tachycardia in the VT zone which was afib with elevated V rates.  This was classified as VT and ATP was delivered. He has had no actual VT since his ablation Today I have reprogrammed by updating the morphology discriminator template and changing discrimination from "if any" to "if all" See Arita Miss Art report IF he has no further VT, then we will consider increasing VT detection zone from 110 to 140 bpm upon return. No driving Decrease sotalol to 120mg  BID  2. afib Stable Continue coumadin  3. Fatigue Decrease sotalol to 120mg  BID today No other changes  4. Chronic systolic dysfunction He is euvolemic on exam today No changes

## 2012-12-25 NOTE — Telephone Encounter (Signed)
Notes Recorded by Lesle Chris, LPN on 1/91/4782 at 4:33 PM Patient in office to see Dr. Johney Frame today. Labs discussed at this time. No change in fluid med per JA. Did decrease Sotalol dose to 120mg  twice a day. BMET ordered to be done around June 2. Patient verbalized understanding.

## 2012-12-25 NOTE — Telephone Encounter (Signed)
Message copied by Lesle Chris on Thu Dec 25, 2012  4:34 PM ------      Message from: Eustace Moore      Created: Thu Dec 25, 2012  3:19 PM                   ----- Message -----         From: Rollene Rotunda, MD         Sent: 12/25/2012   1:52 PM           To: Eustace Moore, LPN            Elevated BNP.  Increase the Lasix to 80 mg bid and check a BMET in 10 days.  Call Mr. Zapf with the results and send results to [facility] B, MD       ------

## 2013-01-01 ENCOUNTER — Other Ambulatory Visit (INDEPENDENT_AMBULATORY_CARE_PROVIDER_SITE_OTHER): Payer: Medicare Other

## 2013-01-01 DIAGNOSIS — I2589 Other forms of chronic ischemic heart disease: Secondary | ICD-10-CM

## 2013-01-01 DIAGNOSIS — I4891 Unspecified atrial fibrillation: Secondary | ICD-10-CM

## 2013-01-07 ENCOUNTER — Encounter: Payer: Self-pay | Admitting: *Deleted

## 2013-01-13 ENCOUNTER — Telehealth: Payer: Self-pay | Admitting: *Deleted

## 2013-01-13 NOTE — Telephone Encounter (Signed)
Patient informed. 

## 2013-01-13 NOTE — Telephone Encounter (Signed)
Message copied by Eustace Moore on Tue Jan 13, 2013 10:26 AM ------      Message from: Rollene Rotunda      Created: Thu Jan 08, 2013  6:02 PM       The EF is about the same.  MR is unchanged.  I will see him in follow up to review his symptoms.  Call Mr. Harbor with the results and send results to [facility] B, MD       ------

## 2013-01-16 ENCOUNTER — Other Ambulatory Visit: Payer: Self-pay | Admitting: Physician Assistant

## 2013-01-20 ENCOUNTER — Ambulatory Visit (INDEPENDENT_AMBULATORY_CARE_PROVIDER_SITE_OTHER): Payer: Medicare Other | Admitting: *Deleted

## 2013-01-20 DIAGNOSIS — Z7901 Long term (current) use of anticoagulants: Secondary | ICD-10-CM

## 2013-01-20 DIAGNOSIS — I4891 Unspecified atrial fibrillation: Secondary | ICD-10-CM

## 2013-01-20 LAB — POCT INR: INR: 5.7

## 2013-01-24 ENCOUNTER — Encounter (HOSPITAL_COMMUNITY): Payer: Self-pay | Admitting: Emergency Medicine

## 2013-01-24 ENCOUNTER — Inpatient Hospital Stay: Admit: 2013-01-24 | Payer: Self-pay | Admitting: Internal Medicine

## 2013-01-24 ENCOUNTER — Inpatient Hospital Stay (HOSPITAL_COMMUNITY)
Admission: AD | Admit: 2013-01-24 | Discharge: 2013-01-28 | DRG: 193 | Disposition: A | Discharge status: Home Health | Payer: Medicare Other | Source: Other Acute Inpatient Hospital | Attending: Internal Medicine | Admitting: Internal Medicine

## 2013-01-24 DIAGNOSIS — D649 Anemia, unspecified: Secondary | ICD-10-CM

## 2013-01-24 DIAGNOSIS — I4891 Unspecified atrial fibrillation: Secondary | ICD-10-CM

## 2013-01-24 DIAGNOSIS — R0602 Shortness of breath: Secondary | ICD-10-CM

## 2013-01-24 DIAGNOSIS — E785 Hyperlipidemia, unspecified: Secondary | ICD-10-CM | POA: Diagnosis present

## 2013-01-24 DIAGNOSIS — I5042 Chronic combined systolic (congestive) and diastolic (congestive) heart failure: Secondary | ICD-10-CM | POA: Diagnosis present

## 2013-01-24 DIAGNOSIS — T40605A Adverse effect of unspecified narcotics, initial encounter: Secondary | ICD-10-CM | POA: Diagnosis present

## 2013-01-24 DIAGNOSIS — Z79899 Other long term (current) drug therapy: Secondary | ICD-10-CM

## 2013-01-24 DIAGNOSIS — I080 Rheumatic disorders of both mitral and aortic valves: Secondary | ICD-10-CM | POA: Diagnosis present

## 2013-01-24 DIAGNOSIS — Z87891 Personal history of nicotine dependence: Secondary | ICD-10-CM

## 2013-01-24 DIAGNOSIS — I509 Heart failure, unspecified: Secondary | ICD-10-CM | POA: Diagnosis present

## 2013-01-24 DIAGNOSIS — K5900 Constipation, unspecified: Secondary | ICD-10-CM | POA: Diagnosis present

## 2013-01-24 DIAGNOSIS — E871 Hypo-osmolality and hyponatremia: Secondary | ICD-10-CM | POA: Diagnosis present

## 2013-01-24 DIAGNOSIS — I10 Essential (primary) hypertension: Secondary | ICD-10-CM | POA: Diagnosis present

## 2013-01-24 DIAGNOSIS — E119 Type 2 diabetes mellitus without complications: Secondary | ICD-10-CM | POA: Diagnosis present

## 2013-01-24 DIAGNOSIS — I2589 Other forms of chronic ischemic heart disease: Secondary | ICD-10-CM | POA: Diagnosis present

## 2013-01-24 DIAGNOSIS — C760 Malignant neoplasm of head, face and neck: Secondary | ICD-10-CM | POA: Diagnosis present

## 2013-01-24 DIAGNOSIS — Z951 Presence of aortocoronary bypass graft: Secondary | ICD-10-CM

## 2013-01-24 DIAGNOSIS — J438 Other emphysema: Secondary | ICD-10-CM | POA: Diagnosis present

## 2013-01-24 DIAGNOSIS — Z95810 Presence of automatic (implantable) cardiac defibrillator: Secondary | ICD-10-CM

## 2013-01-24 DIAGNOSIS — I2510 Atherosclerotic heart disease of native coronary artery without angina pectoris: Secondary | ICD-10-CM

## 2013-01-24 DIAGNOSIS — Z7901 Long term (current) use of anticoagulants: Secondary | ICD-10-CM

## 2013-01-24 DIAGNOSIS — R042 Hemoptysis: Secondary | ICD-10-CM

## 2013-01-24 DIAGNOSIS — I519 Heart disease, unspecified: Secondary | ICD-10-CM

## 2013-01-24 DIAGNOSIS — J189 Pneumonia, unspecified organism: Principal | ICD-10-CM

## 2013-01-24 DIAGNOSIS — I252 Old myocardial infarction: Secondary | ICD-10-CM

## 2013-01-24 DIAGNOSIS — K219 Gastro-esophageal reflux disease without esophagitis: Secondary | ICD-10-CM | POA: Diagnosis present

## 2013-01-24 DIAGNOSIS — K5909 Other constipation: Secondary | ICD-10-CM | POA: Diagnosis present

## 2013-01-24 DIAGNOSIS — J9601 Acute respiratory failure with hypoxia: Secondary | ICD-10-CM

## 2013-01-24 DIAGNOSIS — IMO0002 Reserved for concepts with insufficient information to code with codable children: Secondary | ICD-10-CM

## 2013-01-24 DIAGNOSIS — J9600 Acute respiratory failure, unspecified whether with hypoxia or hypercapnia: Secondary | ICD-10-CM | POA: Diagnosis present

## 2013-01-24 LAB — TROPONIN I: Troponin I: <0.3 ng/mL (ref <0.30)

## 2013-01-24 LAB — CBC
Hemoglobin: 9.4 g/dL — ABNORMAL LOW (ref 13.0–17.0)
RBC: 3.45 MIL/uL — ABNORMAL LOW (ref 4.22–5.81)

## 2013-01-24 LAB — BASIC METABOLIC PANEL
CO2: 21 mEq/L (ref 19–32)
Glucose, Bld: 142 mg/dL — ABNORMAL HIGH (ref 70–99)
Potassium: 4.4 mEq/L (ref 3.5–5.1)
Sodium: 130 mEq/L — ABNORMAL LOW (ref 135–145)

## 2013-01-24 LAB — CK TOTAL AND CKMB (NOT AT ARMC)
Total CK: 138 U/L (ref 7–232)
Total CK: 131 U/L (ref 7–232)

## 2013-01-24 LAB — URINALYSIS, ROUTINE W REFLEX MICROSCOPIC
Glucose, UA: NEGATIVE mg/dL
Ketones, ur: NEGATIVE mg/dL
Leukocytes, UA: NEGATIVE
pH: 5 (ref 5.0–8.0)

## 2013-01-24 LAB — STREP PNEUMONIAE URINARY ANTIGEN: Strep Pneumo Urinary Antigen: NEGATIVE

## 2013-01-24 LAB — MRSA PCR SCREENING: MRSA by PCR: NEGATIVE

## 2013-01-24 MED ORDER — ARFORMOTEROL TARTRATE 15 MCG/2ML IN NEBU
15.0000 ug | INHALATION_SOLUTION | Freq: Two times a day (BID) | RESPIRATORY_TRACT | Status: DC
  Administered 2013-01-24 – 2013-01-28 (×7): 15 ug via RESPIRATORY_TRACT
  Filled 2013-01-24 (×11): qty 2

## 2013-01-24 MED ORDER — BUDESONIDE 0.5 MG/2ML IN SUSP
0.5000 mg | Freq: Two times a day (BID) | RESPIRATORY_TRACT | Status: DC
  Administered 2013-01-24 – 2013-01-28 (×8): 0.5 mg via RESPIRATORY_TRACT
  Filled 2013-01-24 (×10): qty 2

## 2013-01-24 MED ORDER — DEXTROSE 5 % IV SOLN
500.0000 mg | INTRAVENOUS | Status: DC
  Administered 2013-01-24 – 2013-01-27 (×4): 500 mg via INTRAVENOUS
  Filled 2013-01-24 (×6): qty 500

## 2013-01-24 MED ORDER — SOTALOL HCL 120 MG PO TABS
120.0000 mg | ORAL_TABLET | Freq: Two times a day (BID) | ORAL | Status: DC
  Administered 2013-01-24 – 2013-01-28 (×8): 120 mg via ORAL
  Filled 2013-01-24 (×10): qty 1

## 2013-01-24 MED ORDER — DOCUSATE SODIUM 100 MG PO CAPS
100.0000 mg | ORAL_CAPSULE | Freq: Once | ORAL | Status: AC
  Administered 2013-01-24: 100 mg via ORAL
  Filled 2013-01-24: qty 1

## 2013-01-24 MED ORDER — IPRATROPIUM BROMIDE 0.02 % IN SOLN
0.5000 mg | Freq: Four times a day (QID) | RESPIRATORY_TRACT | Status: DC
  Administered 2013-01-24 – 2013-01-25 (×6): 0.5 mg via RESPIRATORY_TRACT
  Filled 2013-01-24 (×6): qty 2.5

## 2013-01-24 MED ORDER — PANTOPRAZOLE SODIUM 40 MG PO TBEC
40.0000 mg | DELAYED_RELEASE_TABLET | Freq: Every day | ORAL | Status: DC
  Administered 2013-01-25 – 2013-01-28 (×4): 40 mg via ORAL
  Filled 2013-01-24 (×3): qty 1

## 2013-01-24 MED ORDER — SODIUM CHLORIDE 0.9 % IV SOLN
INTRAVENOUS | Status: DC
  Administered 2013-01-25: 10 mL via INTRAVENOUS

## 2013-01-24 MED ORDER — FLUTICASONE PROPIONATE 50 MCG/ACT NA SUSP
2.0000 | Freq: Every day | NASAL | Status: DC
  Administered 2013-01-28: 2 via NASAL
  Filled 2013-01-24: qty 16

## 2013-01-24 MED ORDER — DEXTROSE 5 % IV SOLN
1.0000 g | INTRAVENOUS | Status: DC
  Administered 2013-01-24 – 2013-01-27 (×4): 1 g via INTRAVENOUS
  Filled 2013-01-24 (×6): qty 10

## 2013-01-24 MED ORDER — LORATADINE 10 MG PO TABS
10.0000 mg | ORAL_TABLET | Freq: Every day | ORAL | Status: DC
  Administered 2013-01-24 – 2013-01-28 (×5): 10 mg via ORAL
  Filled 2013-01-24 (×5): qty 1

## 2013-01-24 MED ORDER — SIMVASTATIN 40 MG PO TABS
40.0000 mg | ORAL_TABLET | Freq: Every day | ORAL | Status: DC
  Administered 2013-01-24 – 2013-01-27 (×4): 40 mg via ORAL
  Filled 2013-01-24 (×5): qty 1

## 2013-01-24 MED ORDER — ALBUTEROL SULFATE (5 MG/ML) 0.5% IN NEBU
2.5000 mg | INHALATION_SOLUTION | Freq: Four times a day (QID) | RESPIRATORY_TRACT | Status: DC
  Administered 2013-01-24 – 2013-01-25 (×6): 2.5 mg via RESPIRATORY_TRACT
  Filled 2013-01-24 (×6): qty 0.5

## 2013-01-24 MED ORDER — PREDNISONE 20 MG PO TABS
40.0000 mg | ORAL_TABLET | Freq: Every day | ORAL | Status: DC
  Administered 2013-01-24 – 2013-01-28 (×5): 40 mg via ORAL
  Filled 2013-01-24 (×7): qty 2

## 2013-01-24 MED ORDER — NITROGLYCERIN 0.4 MG SL SUBL: 0.4000 mg | SUBLINGUAL_TABLET | SUBLINGUAL | Status: DC | PRN

## 2013-01-24 MED ORDER — POLYETHYLENE GLYCOL 3350 17 G PO PACK
17.0000 g | PACK | Freq: Every day | ORAL | Status: DC
  Administered 2013-01-24 – 2013-01-28 (×4): 17 g via ORAL
  Filled 2013-01-24 (×5): qty 1

## 2013-01-24 NOTE — H&P (Signed)
History and Physical       Hospital Admission Note Date: 01/24/2013  Patient name: [name] Medical record number: [id number] Date of birth: June 03, 1929 Age: 79 y.o. Gender: male PCP: TAPPER,DAVID B, MD    Chief Complaint:  Shortness of breath with hemoptysis  HPI: Patient is a 79 year old male with multiple medical problems including atrial fibrillation on Coumadin, COPD, on maintenance prednisone, GERD, CHF, history of end-stage, CABG, ICD, hyperlipidemia, diet-controlled diabetes, presented to [facility] shortness of breath. Patient reports that he has been having chronic shortness of breath, coughing with history of COPD but this morning woke up with diaphoresis, difficulty breathing  and hemoptysis. He states that he noticed some blood with coughing this morning however has improved recently. Patient is on Coumadin for atrial fibrillation. He also reports that he was started on Percocet a week ago for back pain which was thought to be from kidney stone. This caused significant constipation and he has not had a bowel movement for a week. Patient was found to be in hypoxic respiratory failure at [facility] and required O2 supplementation at 4 L. Patient denies being on oxygen at home. Chest x-ray at [facility] showed extensive right-sided pneumonia and possible alveolar hemorrhage. D-dimer was elevated at 3.6, CT angiogram of the chest did not show acute PE.   Review of Systems:  Constitutional: Denies fever, chills, + diaphoresis, poor appetite and fatigue.  HEENT: Denies photophobia, eye pain, redness, hearing loss, ear pain, congestion, sore throat, rhinorrhea, sneezing, mouth sores, trouble swallowing, neck pain, neck stiffness and tinnitus.   Respiratory: Please see history of present illness  Cardiovascular: Denies chest pain, palpitations and leg swelling.  Gastrointestinal: Denies nausea, vomiting,  abdominal pain, blood in stool and abdominal distention. + constipation Genitourinary: Patient also reports difficulty urinating  Musculoskeletal: Denies myalgias,+ chronic back pain, joint swelling, arthralgias and gait problem.  Skin: Denies pallor, rash and wound.  Neurological: Denies dizziness, seizures, syncope, weakness, light-headedness, numbness and headaches.  Hematological: Denies adenopathy. Easy bruising, personal or family bleeding history  Psychiatric/Behavioral: Denies suicidal ideation, mood changes, confusion, nervousness, sleep disturbance and agitation  Past Medical History: Past Medical History  Diagnosis Date  . Hypertension   . Coronary artery disease     a. CABG 03/2006. b. NSTEMI 03/2012 following VT likely demand ischemia - grafts patent at cath.  . Diabetes mellitus   . Other specified forms of chronic ischemic heart disease   . Syncope and collapse   . Automatic implantable cardiac defibrillator in situ     QRS duration 120 ms no typical left bundle branch block.  . Atrial fibrillation     Taking sotalol, intolerant to amiodarone  . Valvular heart disease     Mild to moderate mitral valve insufficiency and mild aortic insufficiency  . Chronic airway obstruction, not elsewhere classified   . Old myocardial infarction   . Other and unspecified hyperlipidemia   . Congestive heart failure, unspecified     a. Ischemic cardiomyopathy ejection fraction 30-35% previously. b. Down to 15% by cath 03/2012.  [name] Cerebrovascular disease, unspecified   . Other mechanical complication of other internal orthopedic device, implant, and graft   . Carotid artery disease     s/p LCEA 2007.  [name] Head and neck cancer     Receiving radiation therapy.  . Orthostatic hypotension   . Ventricular tachycardia     a. S/p ICD implantation. b. Recurrent in 03/2012 with medication adjustment.;  c. s/p RFCA 11/2012 (Dr.  JA)    Past Surgical History  Procedure Laterality Date  . Repeat otif  left knee with figure-of-eight tension band.    . Implantation of a dual-chamber implantable  06/18/2007  . Left carotid endarterectomy    . Coronary artery bypass graft  04/04/2006  . Vt ablation  11/06/12    Ischemic VT ablation by Dr Johney Frame    Medications: Prior to Admission medications   Medication Sig Start Date End Date Taking? Authorizing Provider  albuterol (PROVENTIL HFA;VENTOLIN HFA) 108 (90 BASE) MCG/ACT inhaler Inhale 2 puffs into the lungs every 6 (six) hours as needed for wheezing or shortness of breath.   Yes Historical Provider, MD  budesonide (PULMICORT) 0.5 MG/2ML nebulizer solution Take 0.5 mg by nebulization 2 (two) times daily.   Yes Historical Provider, MD  docusate sodium (COLACE) 100 MG capsule Take 100 mg by mouth 2 (two) times daily as needed for constipation.   Yes Historical Provider, MD  fluticasone (FLONASE) 50 MCG/ACT nasal spray Place 2 sprays into the nose daily.   Yes Historical Provider, MD  formoterol (PERFOROMIST) 20 MCG/2ML nebulizer solution Take 20 mcg by nebulization 2 (two) times daily.   Yes Historical Provider, MD  furosemide (LASIX) 40 MG tablet Take 60 mg by mouth daily.   Yes Historical Provider, MD  loratadine (CLARITIN) 10 MG tablet Take 10 mg by mouth daily. For allergies   Yes Historical Provider, MD  megestrol (MEGACE) 40 MG/ML suspension Take by mouth as needed. Take 2 teaspoonfuls (10ml) by mouth daily 10/23/12  Yes Rollene Rotunda, MD  metoprolol succinate (TOPROL-XL) 50 MG 24 hr tablet Take 1 tablet (50 mg total) by mouth daily. 12/24/12  Yes Rollene Rotunda, MD  nitroGLYCERIN (NITROSTAT) 0.4 MG SL tablet Place 1 tablet (0.4 mg total) under the tongue every 5 (five) minutes as needed for chest pain. 11/26/11  Yes June Leap, MD  omeprazole (PRILOSEC) 20 MG capsule Take 20 mg by mouth daily.   Yes Historical Provider, MD  oxyCODONE-acetaminophen (PERCOCET) 5-325 MG per tablet Take 1-2 tablets by mouth every 6 (six) hours as needed for pain.    Yes Historical Provider, MD  predniSONE (DELTASONE) 5 MG tablet Take 5 mg by mouth daily.  08/28/11  Yes June Leap, MD  simvastatin (ZOCOR) 40 MG tablet Take 40 mg by mouth at bedtime.     Yes Historical Provider, MD  sotalol (BETAPACE) 120 MG tablet Take 1 tablet (120 mg total) by mouth 2 (two) times daily. 12/25/12  Yes Hillis Range, MD  spironolactone (ALDACTONE) 25 MG tablet Take 25 mg by mouth daily.   Yes Historical Provider, MD  warfarin (COUMADIN) 5 MG tablet Take 2.5-5 mg by mouth daily. Took 5 mg (1 tablet) on Mondays, Wednesdays, and Fridays On other days, took 2.5 mg (1/2 tablet) 12/16/12   Rollene Rotunda, MD    Allergies:  No Known Allergies  Social History:  reports that he quit smoking about 22 years ago. His smoking use included Cigarettes. He has a 120 pack-year smoking history. He has never used smokeless tobacco. He reports that he does not drink alcohol or use illicit drugs.  Family History: Patient reports significant family  history of heart disease and hypertension  Physical Exam: Blood pressure 125/59, pulse 78, temperature 98 F (36.7 C), temperature source Oral, resp. rate 15, height 5\' 9"  (1.753 m), weight 69.3 kg (152 lb 12.5 oz), SpO2 100.00%. General: Alert, awake, oriented x3, in no acute distress. HEENT: normocephalic, atraumatic,  anicteric sclera, pink conjunctiva, pupils equal and reactive to light and accomodation, oropharynx clear Neck: supple, no masses or lymphadenopathy, no goiter, no bruits  Heart: Regular rate and rhythm, without murmurs, rubs or gallops. Lungs: Decreased breath sound at the bases otherwise no wheezing or rhonchi  Abdomen: Soft, nontender, nondistended, positive bowel sounds, no masses. Extremities: No clubbing, cyanosis or edema with positive pedal pulses. Neuro: Grossly intact, no focal neurological deficits, strength 5/5 upper and lower extremities bilaterally Psych: alert and oriented x 3, normal mood and affect Skin: no  rashes or lesions, warm and dry   LABS on Admission: at [place] [facility]  Basic Metabolic Panel: Sodium 132, potassium 3.2 chloride 99 CO2 24 magnesium 1.9 calcium 9.0 creatinine 1.0 BUN 23  Liver Function Tests: Alkaline phosphatase 62 AST 22 ALT 17 lipase 19  CBC: WBC 13.7 hemoglobin 10.7 hematocrit 33.3 PLT 268, segmental neutrophils 84% Cardiac Enzymes: Troponin 0.12  Lactic acid 2.7 INR 1.8, d-dimer 3.67  BNP 1223  ABG pH 7.46, PCO2 31, PO2 84, O2 sats 97%  Chest x-ray showed post CABG and AICD COPD with extensive airspace opacification of the right lung, could represent pneumonia or alveolar hemorrhage questionable nodular density versus artifact in the mid left lung  CT chest: Severe emphysema Minimal right side pleural effusion Extensive right sided pneumonia No PE  Assessment/Plan Principal Problem:   Acute respiratory failure with hypoxia secondary to right-sided pneumonia - Continue O2 supplementation, place on scheduled nebulizer treatments, placed on IV Rocephin and Zithromax, prednisone - No pulmonary embolism on CT angiogram. - Obtain urine Legionella antigen, urine strep antigen, blood cultures, sputum cultures - Hold Coumadin until hemoptysis is resolved, d/w Dr Marchelle Gearing, scant hemoptysis is likely secondary to extensive right-sided pneumonia and possibly may have some focal alveolar hemorrhage in that area. Patient has no pulmonary embolism or infarction. Continue to monitor closely and consult pulmonology if frank hemoptysis or hemodynamically unstable  Active Problems:    DYSLIPIDEMIA- continue statins     HYPERTENSION: - Continue outpatient antihypertensive     ISCHEMIC CARDIOMYOPATHY with history of  Coronary artery disease - Obtain serial cardiac enzymes, 2-D echocardiogram due to dyspnea and history of CHF  -Patient was given 2 L of IV fluids at [facility], echo 1/13  EF 30-35% with akinesis of the entire inferior and mid inferolateral  myocardium. - Saline lock for now    Atrial fibrillation - Currently rate controlled, continue sotalol, hold Coumadin secondary to complaint of hemoptysis    Unspecified constipation: Likely secondary to narcotics - Placed on MiraLax, Colace, enema   DVT prophylaxis:  SCDs for now   CODE STATUS:  full CODE STATUS   Further plan will depend as patient's clinical course evolves and further radiologic and laboratory data become available.   Time Spent on Admission: 1 hour  Zaara Sprowl M.D. [facility] 01/24/2013, 3:27 PM Pager: [phone]  If 7PM-7AM, please contact night-coverage [email or web address] Password [email or web address]

## 2013-01-25 ENCOUNTER — Inpatient Hospital Stay (HOSPITAL_COMMUNITY): Payer: Medicare Other

## 2013-01-25 DIAGNOSIS — R042 Hemoptysis: Secondary | ICD-10-CM

## 2013-01-25 DIAGNOSIS — I5042 Chronic combined systolic (congestive) and diastolic (congestive) heart failure: Secondary | ICD-10-CM | POA: Diagnosis present

## 2013-01-25 LAB — TROPONIN I: Troponin I: <0.3 ng/mL (ref <0.30)

## 2013-01-25 LAB — BASIC METABOLIC PANEL
BUN: 18 mg/dL (ref 6–23)
CO2: 23 mEq/L (ref 19–32)
Chloride: 95 mEq/L — ABNORMAL LOW (ref 96–112)
Creatinine, Ser: 0.8 mg/dL (ref 0.50–1.35)

## 2013-01-25 LAB — CBC
HCT: 26.5 % — ABNORMAL LOW (ref 39.0–52.0)
MCV: 84.4 fL (ref 78.0–100.0)
RDW: 16.3 % — ABNORMAL HIGH (ref 11.5–15.5)
WBC: 14.4 10*3/uL — ABNORMAL HIGH (ref 4.0–10.5)

## 2013-01-25 LAB — CK TOTAL AND CKMB (NOT AT ARMC)
CK, MB: 2.8 ng/mL (ref 0.3–4.0)
Relative Index: 2 (ref 0.0–2.5)

## 2013-01-25 LAB — PROTIME-INR: INR: 2.58 — ABNORMAL HIGH (ref 0.00–1.49)

## 2013-01-25 LAB — LEGIONELLA ANTIGEN, URINE

## 2013-01-25 MED ORDER — LORAZEPAM 0.5 MG PO TABS
0.5000 mg | ORAL_TABLET | ORAL | Status: DC | PRN
  Administered 2013-01-25: 0.5 mg via ORAL
  Filled 2013-01-25: qty 1

## 2013-01-25 MED ORDER — FUROSEMIDE 10 MG/ML IJ SOLN
INTRAMUSCULAR | Status: AC
  Administered 2013-01-25: 40 mg via INTRAVENOUS
  Filled 2013-01-25: qty 4

## 2013-01-25 MED ORDER — SPIRONOLACTONE 25 MG PO TABS
25.0000 mg | ORAL_TABLET | Freq: Every day | ORAL | Status: DC
  Administered 2013-01-25: 25 mg via ORAL
  Filled 2013-01-25 (×2): qty 1

## 2013-01-25 MED ORDER — FUROSEMIDE 40 MG PO TABS
60.0000 mg | ORAL_TABLET | Freq: Every day | ORAL | Status: DC
  Administered 2013-01-25: 60 mg via ORAL
  Filled 2013-01-25 (×2): qty 1

## 2013-01-25 MED ORDER — FUROSEMIDE 10 MG/ML IJ SOLN: 40.0000 mg | Freq: Once | INTRAMUSCULAR | Status: AC

## 2013-01-25 MED ORDER — MEGESTROL ACETATE 400 MG/10ML PO SUSP
400.0000 mg | Freq: Two times a day (BID) | ORAL | Status: DC
  Administered 2013-01-25 – 2013-01-28 (×7): 400 mg via ORAL
  Filled 2013-01-25 (×8): qty 10

## 2013-01-25 MED ORDER — METOPROLOL SUCCINATE 12.5 MG HALF TABLET: 12.5000 mg | ORAL_TABLET | Freq: Every day | ORAL | Status: DC

## 2013-01-25 NOTE — Evaluation (Signed)
Physical Therapy Evaluation Patient Details Name: [name] MRN: [id number] DOB: February 12, 1929 Today's Date: 01/25/2013 Time: 0110-0132 PT Time Calculation (min): 22 min  PT Assessment / Plan / Recommendation Clinical Impression  Patient admitted with pneumonia and now presents with generalized weakness and decreased endurance/SOB during mobility.  Patient will benefit from PT to increase mobility and independence for return home.      PT Assessment  Patient needs continued PT services    Follow Up Recommendations  Home health PT    Does the patient have the potential to tolerate intense rehabilitation      Barriers to Discharge        Equipment Recommendations  None recommended by PT    Recommendations for Other Services     Frequency Min 3X/week    Precautions / Restrictions     Pertinent Vitals/Pain No pain indicated      Mobility  Bed Mobility Bed Mobility: Supine to Sit Supine to Sit: 4: Min assist;HOB elevated Transfers Transfers: Sit to Stand;Stand to Sit Sit to Stand: 4: Min assist;With upper extremity assist;From bed Stand to Sit: 4: Min assist;With upper extremity assist;To chair/3-in-1 Ambulation/Gait Ambulation/Gait Assistance: 4: Min guard Ambulation Distance (Feet): 60 Feet Assistive device: None Ambulation/Gait Assistance Details: limited by SOB, required one rest break halfway Gait Pattern: Step-through pattern;Trunk flexed Gait velocity: decreased    Exercises     PT Diagnosis: Generalized weakness  PT Problem List: Decreased activity tolerance;Decreased mobility;Cardiopulmonary status limiting activity PT Treatment Interventions: Gait training;Functional mobility training;Therapeutic activities;Patient/family education   PT Goals Acute Rehab PT Goals PT Goal Formulation: With patient/family Time For Goal Achievement: 02/01/13 Potential to Achieve Goals: Good Pt will go Supine/Side to Sit: Independently PT Goal: Supine/Side to Sit -  Progress: Goal set today Pt will go Sit to Stand: with modified independence;with upper extremity assist PT Goal: Sit to Stand - Progress: Goal set today Pt will go Stand to Sit: with modified independence;with upper extremity assist PT Goal: Stand to Sit - Progress: Goal set today Pt will Ambulate: with modified independence;51 - 150 feet;with least restrictive assistive device PT Goal: Ambulate - Progress: Goal set today  Visit Information  Last PT Received On: 01/25/13 Assistance Needed: +1    Subjective Data  Subjective: I am short of breath Patient Stated Goal: get back home   Prior Functioning  Home Living Lives With: Spouse Available Help at Discharge: Family Type of Home: House Home Access: Stairs to enter Secretary/administrator of Steps: 1 Home Layout: One level Home Adaptive Equipment: None Prior Function Level of Independence: Independent Communication Communication: No difficulties    Cognition  Cognition Arousal/Alertness: Awake/alert Behavior During Therapy: WFL for tasks assessed/performed Overall Cognitive Status: Within Functional Limits for tasks assessed    Extremity/Trunk Assessment Right Upper Extremity Assessment RUE ROM/Strength/Tone: [facility] for tasks assessed Left Upper Extremity Assessment LUE ROM/Strength/Tone: [facility] for tasks assessed Right Lower Extremity Assessment RLE ROM/Strength/Tone: [facility] for tasks assessed Left Lower Extremity Assessment LLE ROM/Strength/Tone: [facility] for tasks assessed Trunk Assessment Trunk Assessment: Normal   Balance Balance Balance Assessed:  (no apparent balance deficits)  End of Session PT - End of Session Equipment Utilized During Treatment: Gait belt Activity Tolerance: Patient tolerated treatment well Patient left: in chair;with call bell/phone within reach;with family/visitor present  GP     Olivia Canter 01/25/2013, 1:41 PM

## 2013-01-25 NOTE — Progress Notes (Signed)
[facility] Progress Note [name] TEAM 1 - Stepdown/ICU TEAM   [name]:[id number] DOB: Dec 03, 1928 DOA: 01/24/2013 PCP: Louie Boston, MD  Brief narrative: Patient is a 79 year old male with multiple medical problems including atrial fibrillation on Coumadin, COPD, on maintenance prednisone, GERD, CHF, history of end-stage, CABG, ICD, hyperlipidemia, diet-controlled diabetes, presented to [facility] shortness of breath. Patient reports that he has been having chronic shortness of breath, coughing with history of COPD but this morning woke up with diaphoresis, difficulty breathing and hemoptysis. He states that he noticed some blood with coughing this morning however has improved recently.  Patient is on Coumadin for atrial fibrillation. He also reports that he was started on Percocet a week ago for back pain which was thought to be from kidney stone. This caused significant constipation and he has not had a bowel movement for a week. Patient was found to be in hypoxic respiratory failure at [facility] and required O2 supplementation at 4 L. Patient denies being on oxygen at home. Chest x-ray at [facility] showed extensive right-sided pneumonia and possible alveolar hemorrhage. D-dimer was elevated at 3.6, CT angiogram of the chest did not show acute PE.    Assessment/Plan: Acute respiratory failure with hypoxia and hemoptysis secondary to right-sided pneumonia  - Continue O2 supplementation, place on scheduled nebulizer treatments, placed on IV Rocephin and Zithromax - also placed on prednisone for COPD - No pulmonary embolism on CT angiogram.  - Hold Coumadin until hemoptysis is resolved  Active Problems:  DYSLIPIDEMIA - continue statins   HYPERTENSION:  - Continue outpatient antihypertensive   ISCHEMIC CARDIOMYOPATHY/ chronic systolic and diastolic CHF - serial cardiac enzymes negative - 2-D pending but last ECHO from 5/29 revealed EF 30-35 % and  diastolic dysfunction with akinesis of the entire inferior and mid inferolateral myocardium.  -Patient was given 2 L of IV fluids at [facility] hospital,- Saline lock for now   Hyponatremia - follow for now- cont diuretics  Atrial fibrillation  - Currently rate controlled, continue sotalol, hold Coumadin secondary to complaint of hemoptysis   Unspecified constipation: Likely secondary to narcotics  - Placed on MiraLax, Colace, enema     Code Status: full Family Communication: with wife Disposition Plan: transfer out of SDU  Consultants: none  Procedures: none  Antibiotics: Rocephin/Zithromax 6/21  DVT prophylaxis: INR therapeutic  HPI/Subjective: Cough (mild hemoptysis) with some dyspnea this morning- no chest pain. Was constipated- but had BM this AM with laxatives   Objective: Blood pressure 106/56, pulse 81, temperature 97.9 F (36.6 C), temperature source Oral, resp. rate 28, height 5\' 9"  (1.753 m), weight 69.3 kg (152 lb 12.5 oz), SpO2 91.00%.  Intake/Output Summary (Last 24 hours) at 01/25/13 1501 Last data filed at 01/25/13 1300  Gross per 24 hour  Intake   1140 ml  Output   1152 ml  Net    -12 ml     Exam: General: No acute respiratory distress Lungs: Crackles and decreased breath sounds in RLL Cardiovascular: Irregular rate and rhythm without murmur gallop or rub normal S1 and S2 Abdomen: Nontender, nondistended, soft, bowel sounds positive, no rebound, no ascites, no appreciable mass Extremities: No significant cyanosis, clubbing, or edema bilateral lower extremities  Data Reviewed: Basic Metabolic Panel:  Recent Labs Lab 01/24/13 1727 01/25/13 0400  NA 130* 129*  K 4.4 4.1  CL 95* 95*  CO2 21 23  GLUCOSE 142* 116*  BUN 18 18  CREATININE 0.84 0.80  CALCIUM 9.2 9.3  Liver Function Tests: No results found for this basename: AST, ALT, ALKPHOS, BILITOT, PROT, ALBUMIN,  in the last 168 hours No results found for this basename: LIPASE,  AMYLASE,  in the last 168 hours No results found for this basename: AMMONIA,  in the last 168 hours CBC:  Recent Labs Lab 01/24/13 1727 01/25/13 0400  WBC 15.3* 14.4*  HGB 9.4* 8.6*  HCT 29.3* 26.5*  MCV 84.9 84.4  PLT 248 242   Cardiac Enzymes:  Recent Labs Lab 01/24/13 1728 01/24/13 2224 01/25/13 0400  CKTOTAL 138 131 140  CKMB 3.1 2.8 2.8  TROPONINI <0.30 <0.30 <0.30   BNP (last 3 results)  Recent Labs  03/05/12 2235  PROBNP 4955.0*   CBG:  Recent Labs Lab 01/24/13 2213  GLUCAP 134*    Recent Results (from the past 240 hour(s))  MRSA PCR SCREENING     Status: None   Collection Time    01/24/13  2:42 PM      Result Value Range Status   MRSA by PCR NEGATIVE  NEGATIVE Final   Comment:            The GeneXpert MRSA Assay (FDA     approved for NASAL specimens     only), is one component of a     comprehensive MRSA colonization     surveillance program. It is not     intended to diagnose MRSA     infection nor to guide or     monitor treatment for     MRSA infections.  CULTURE, BLOOD (ROUTINE X 2)     Status: None   Collection Time    01/24/13  5:30 PM      Result Value Range Status   Specimen Description BLOOD LEFT ARM   Final   Special Requests BOTTLES DRAWN AEROBIC ONLY 7CC   Final   Culture  Setup Time 01/24/2013 22:05   Final   Culture     Final   Value:        BLOOD CULTURE RECEIVED NO GROWTH TO DATE CULTURE WILL BE HELD FOR 5 DAYS BEFORE ISSUING A FINAL NEGATIVE REPORT   Report Status PENDING   Incomplete  CULTURE, BLOOD (ROUTINE X 2)     Status: None   Collection Time    01/24/13  5:35 PM      Result Value Range Status   Specimen Description BLOOD LEFT HAND   Final   Special Requests BOTTLES DRAWN AEROBIC ONLY 3CC   Final   Culture  Setup Time 01/24/2013 22:05   Final   Culture     Final   Value:        BLOOD CULTURE RECEIVED NO GROWTH TO DATE CULTURE WILL BE HELD FOR 5 DAYS BEFORE ISSUING A FINAL NEGATIVE REPORT   Report Status PENDING    Incomplete     Studies:  Recent x-ray studies have been reviewed in detail by the Attending Physician  Scheduled Meds:  Scheduled Meds: . albuterol  2.5 mg Nebulization Q6H   And  . ipratropium  0.5 mg Nebulization Q6H  . arformoterol  15 mcg Nebulization Q12H  . azithromycin  500 mg Intravenous Q24H  . budesonide  0.5 mg Nebulization BID  . cefTRIAXone (ROCEPHIN)  IV  1 g Intravenous Q24H  . fluticasone  2 spray Each Nare Daily  . loratadine  10 mg Oral Daily  . pantoprazole  40 mg Oral Q breakfast  . polyethylene glycol  17 g Oral  Daily  . predniSONE  40 mg Oral Q breakfast  . simvastatin  40 mg Oral QHS  . sotalol  120 mg Oral BID   Continuous Infusions: . sodium chloride      Time spent on care of this patient: 35 min   Calvert Cantor, MD [phone]  [facility] Office  [phone] Pager - Text Page per Amion as per below:  On-Call/Text Page:      [email or web address]      password [email or web address]  If 7PM-7AM, please contact night-coverage [email or web address] Password [email or web address] 01/25/2013, 3:01 PM   LOS: 1 day

## 2013-01-26 DIAGNOSIS — I509 Heart failure, unspecified: Secondary | ICD-10-CM

## 2013-01-26 DIAGNOSIS — I5042 Chronic combined systolic (congestive) and diastolic (congestive) heart failure: Secondary | ICD-10-CM

## 2013-01-26 DIAGNOSIS — Z7901 Long term (current) use of anticoagulants: Secondary | ICD-10-CM

## 2013-01-26 DIAGNOSIS — I472 Ventricular tachycardia: Secondary | ICD-10-CM

## 2013-01-26 DIAGNOSIS — I5022 Chronic systolic (congestive) heart failure: Secondary | ICD-10-CM

## 2013-01-26 DIAGNOSIS — J9600 Acute respiratory failure, unspecified whether with hypoxia or hypercapnia: Secondary | ICD-10-CM

## 2013-01-26 LAB — URINE CULTURE: Colony Count: NO GROWTH

## 2013-01-26 LAB — BASIC METABOLIC PANEL
CO2: 25 mEq/L (ref 19–32)
Calcium: 8.8 mg/dL (ref 8.4–10.5)
Chloride: 94 mEq/L — ABNORMAL LOW (ref 96–112)
Sodium: 129 mEq/L — ABNORMAL LOW (ref 135–145)

## 2013-01-26 LAB — CBC
HCT: 25.7 % — ABNORMAL LOW (ref 39.0–52.0)
MCV: 82.9 fL (ref 78.0–100.0)
Platelets: 240 10*3/uL (ref 150–400)
RBC: 3.1 MIL/uL — ABNORMAL LOW (ref 4.22–5.81)
WBC: 12.4 10*3/uL — ABNORMAL HIGH (ref 4.0–10.5)

## 2013-01-26 MED ORDER — SODIUM CHLORIDE 0.9 % IV BOLUS (SEPSIS)
500.0000 mL | Freq: Once | INTRAVENOUS | Status: AC
  Administered 2013-01-26: 16:00:00 via INTRAVENOUS

## 2013-01-26 MED ORDER — SODIUM CHLORIDE 0.9 % IV BOLUS (SEPSIS)
250.0000 mL | Freq: Once | INTRAVENOUS | Status: AC
  Administered 2013-01-26: 250 mL via INTRAVENOUS

## 2013-01-26 MED ORDER — POTASSIUM CHLORIDE CRYS ER 20 MEQ PO TBCR
20.0000 meq | EXTENDED_RELEASE_TABLET | Freq: Once | ORAL | Status: AC
  Administered 2013-01-26: 20 meq via ORAL
  Filled 2013-01-26: qty 1

## 2013-01-26 MED ORDER — DIGOXIN 125 MCG PO TABS
0.1250 mg | ORAL_TABLET | Freq: Every day | ORAL | Status: DC
  Administered 2013-01-26 – 2013-01-28 (×3): 0.125 mg via ORAL
  Filled 2013-01-26 (×3): qty 1

## 2013-01-26 MED ORDER — IPRATROPIUM BROMIDE 0.02 % IN SOLN
0.5000 mg | Freq: Four times a day (QID) | RESPIRATORY_TRACT | Status: DC
  Administered 2013-01-26 (×2): 0.5 mg via RESPIRATORY_TRACT
  Filled 2013-01-26 (×2): qty 2.5

## 2013-01-26 MED ORDER — IPRATROPIUM BROMIDE 0.02 % IN SOLN
0.5000 mg | Freq: Three times a day (TID) | RESPIRATORY_TRACT | Status: DC
  Administered 2013-01-27 – 2013-01-28 (×4): 0.5 mg via RESPIRATORY_TRACT
  Filled 2013-01-26 (×4): qty 2.5

## 2013-01-26 MED ORDER — LEVALBUTEROL HCL 0.63 MG/3ML IN NEBU
0.6300 mg | INHALATION_SOLUTION | Freq: Three times a day (TID) | RESPIRATORY_TRACT | Status: DC
  Administered 2013-01-27 – 2013-01-28 (×4): 0.63 mg via RESPIRATORY_TRACT
  Filled 2013-01-26 (×10): qty 3

## 2013-01-26 MED ORDER — DIGOXIN 0.25 MG/ML IJ SOLN
0.2500 mg | Freq: Once | INTRAMUSCULAR | Status: AC
  Administered 2013-01-26: 0.25 mg via INTRAVENOUS
  Filled 2013-01-26: qty 1

## 2013-01-26 MED ORDER — FUROSEMIDE 40 MG PO TABS
60.0000 mg | ORAL_TABLET | Freq: Every day | ORAL | Status: DC
  Administered 2013-01-26 – 2013-01-28 (×3): 60 mg via ORAL
  Filled 2013-01-26 (×3): qty 1

## 2013-01-26 MED ORDER — ALBUTEROL SULFATE (5 MG/ML) 0.5% IN NEBU
2.5000 mg | INHALATION_SOLUTION | Freq: Four times a day (QID) | RESPIRATORY_TRACT | Status: DC
  Administered 2013-01-26: 2.5 mg via RESPIRATORY_TRACT
  Filled 2013-01-26: qty 0.5

## 2013-01-26 MED ORDER — LEVALBUTEROL HCL 0.63 MG/3ML IN NEBU
0.6300 mg | INHALATION_SOLUTION | Freq: Four times a day (QID) | RESPIRATORY_TRACT | Status: DC
  Administered 2013-01-26 (×2): 0.63 mg via RESPIRATORY_TRACT
  Filled 2013-01-26 (×2): qty 3

## 2013-01-26 MED ORDER — IPRATROPIUM BROMIDE 0.02 % IN SOLN
0.5000 mg | Freq: Four times a day (QID) | RESPIRATORY_TRACT | Status: DC
  Administered 2013-01-26: 0.5 mg via RESPIRATORY_TRACT
  Filled 2013-01-26: qty 2.5

## 2013-01-26 MED ORDER — METOPROLOL SUCCINATE ER 25 MG PO TB24
25.0000 mg | ORAL_TABLET | Freq: Two times a day (BID) | ORAL | Status: DC
  Administered 2013-01-26 – 2013-01-28 (×4): 25 mg via ORAL
  Filled 2013-01-26 (×6): qty 1

## 2013-01-26 MED ORDER — SPIRONOLACTONE 25 MG PO TABS
25.0000 mg | ORAL_TABLET | Freq: Every day | ORAL | Status: DC
  Administered 2013-01-27 – 2013-01-28 (×2): 25 mg via ORAL
  Filled 2013-01-26 (×2): qty 1

## 2013-01-26 NOTE — Progress Notes (Signed)
Patient ID: [name]  male  AOZ:[id number]    DOB: [birth year]    DOA: 01/24/2013  PCP: Louie Boston, MD  Assessment/Plan: Principal Problem:  Atrial fibrillation with RVR: Has chronic A. fib, - At the time of my examination still RVR 90-130, give 1 dose of digoxin, now more stable, give fluid bolus - Restart Coumadin   Acute respiratory failure with hypoxia and hemoptysis secondary to right-sided pneumonia  - Continue O2 supplementation, place on scheduled nebulizer treatments, placed on IV Rocephin and Zithromax  - also placed on prednisone for COPD  - No pulmonary embolism on CT angiogram.   DYSLIPIDEMIA  - continue statins   HYPERTENSION:  - Currently borderline soft, continue to hold diuretics, continue beta blocker for rate control  ISCHEMIC CARDIOMYOPATHY/ chronic systolic and diastolic CHF  - serial cardiac enzymes negative  - ECHO from 5/29 revealed EF 30-35 % and diastolic dysfunction with akinesis of the entire inferior and mid inferolateral myocardium.   Hyponatremia  - Stable  DVT Prophylaxis:  Code Status:  Disposition:    Subjective:  Had a rough night, patient overnight was in A. fib with RVR, did not sleep well last night, wife at the bedside,  Objective: Weight change:   Intake/Output Summary (Last 24 hours) at 01/26/13 1558 Last data filed at 01/25/13 2338  Gross per 24 hour  Intake      0 ml  Output   1625 ml  Net  -1625 ml   Blood pressure 88/57, pulse 85, temperature 97.6 F (36.4 C), temperature source Oral, resp. rate 18, height 5\' 9"  (1.753 m), weight 69.3 kg (152 lb 12.5 oz), SpO2 98.00%.  Physical Exam: General: Sleepy, not in any acute distress. HEENT: anicteric sclera, PERLA, EOMI CVS: Tachycardia, irregularly irregular S1-S2 clear Chest: clear to auscultation bilaterally, no wheezing, rales or rhonchi Abdomen: soft nontender, nondistended, normal bowel sounds  Extremities: no cyanosis, clubbing or edema noted  bilaterally   Lab Results: Basic Metabolic Panel:  Recent Labs Lab 01/25/13 0400 01/26/13 0350  NA 129* 129*  K 4.1 3.6  CL 95* 94*  CO2 23 25  GLUCOSE 116* 115*  BUN 18 23  CREATININE 0.80 0.91  CALCIUM 9.3 8.8   Liver Function Tests: No results found for this basename: AST, ALT, ALKPHOS, BILITOT, PROT, ALBUMIN,  in the last 168 hours No results found for this basename: LIPASE, AMYLASE,  in the last 168 hours No results found for this basename: AMMONIA,  in the last 168 hours CBC:  Recent Labs Lab 01/25/13 0400 01/26/13 0350  WBC 14.4* 12.4*  HGB 8.6* 8.5*  HCT 26.5* 25.7*  MCV 84.4 82.9  PLT 242 240   Cardiac Enzymes:  Recent Labs Lab 01/24/13 1728 01/24/13 2224 01/25/13 0400  CKTOTAL 138 131 140  CKMB 3.1 2.8 2.8  TROPONINI <0.30 <0.30 <0.30   BNP: No components found with this basename: POCBNP,  CBG:  Recent Labs Lab 01/24/13 2213  GLUCAP 134*     Micro Results: Recent Results (from the past 240 hour(s))  MRSA PCR SCREENING     Status: None   Collection Time    01/24/13  2:42 PM      Result Value Range Status   MRSA by PCR NEGATIVE  NEGATIVE Final   Comment:            The GeneXpert MRSA Assay (FDA     approved for NASAL specimens     only), is one component of a  comprehensive MRSA colonization     surveillance program. It is not     intended to diagnose MRSA     infection nor to guide or     monitor treatment for     MRSA infections.  URINE CULTURE     Status: None   Collection Time    01/24/13  5:21 PM      Result Value Range Status   Specimen Description URINE, CLEAN CATCH   Final   Special Requests Normal   Final   Culture  Setup Time 01/25/2013 03:10   Final   Colony Count NO GROWTH   Final   Culture NO GROWTH   Final   Report Status 01/26/2013 FINAL   Final  CULTURE, BLOOD (ROUTINE X 2)     Status: None   Collection Time    01/24/13  5:30 PM      Result Value Range Status   Specimen Description BLOOD LEFT ARM    Final   Special Requests BOTTLES DRAWN AEROBIC ONLY 7CC   Final   Culture  Setup Time 01/24/2013 22:05   Final   Culture     Final   Value:        BLOOD CULTURE RECEIVED NO GROWTH TO DATE CULTURE WILL BE HELD FOR 5 DAYS BEFORE ISSUING A FINAL NEGATIVE REPORT   Report Status PENDING   Incomplete  CULTURE, BLOOD (ROUTINE X 2)     Status: None   Collection Time    01/24/13  5:35 PM      Result Value Range Status   Specimen Description BLOOD LEFT HAND   Final   Special Requests BOTTLES DRAWN AEROBIC ONLY 3CC   Final   Culture  Setup Time 01/24/2013 22:05   Final   Culture     Final   Value:        BLOOD CULTURE RECEIVED NO GROWTH TO DATE CULTURE WILL BE HELD FOR 5 DAYS BEFORE ISSUING A FINAL NEGATIVE REPORT   Report Status PENDING   Incomplete    Studies/Results: Dg Chest Port 1 View  01/25/2013   *RADIOLOGY REPORT*  Clinical Data: Dyspnea.  PORTABLE CHEST - 1 VIEW  Comparison: January 24, 2013.  Findings: Status post coronary artery bypass graft.  Left-sided pacemaker is unchanged.  Stable cardiomediastinal silhouette.  Left lung is clear.  The right lung opacity is improved consistent with improving pneumonia.  Left lung nodule seen on prior exam is not well visualized currently.  IMPRESSION: Improved right lung opacity consistent with improving pneumonia.   Original Report Authenticated By: Lupita Raider.,  M.D.    Medications: Scheduled Meds: . arformoterol  15 mcg Nebulization Q12H  . azithromycin  500 mg Intravenous Q24H  . budesonide  0.5 mg Nebulization BID  . cefTRIAXone (ROCEPHIN)  IV  1 g Intravenous Q24H  . fluticasone  2 spray Each Nare Daily  . levalbuterol  0.63 mg Nebulization Q6H   And  . ipratropium  0.5 mg Nebulization Q6H  . loratadine  10 mg Oral Daily  . megestrol  400 mg Oral BID  . pantoprazole  40 mg Oral Q breakfast  . polyethylene glycol  17 g Oral Daily  . predniSONE  40 mg Oral Q breakfast  . simvastatin  40 mg Oral QHS  . sodium chloride  500 mL  Intravenous Once  . sotalol  120 mg Oral BID      LOS: 2 days   RAI,RIPUDEEP M.D. [facility] 01/26/2013, 3:58  PM Pager: [phone]  If 7PM-7AM, please contact night-coverage [email or web address] Password [email or web address]

## 2013-01-26 NOTE — Progress Notes (Signed)
Telemetry shows HR 110-140s sustained A-fib. BP is 82/52. Notified [facility] on call. Orders received. Will continue to monitor.

## 2013-01-26 NOTE — Consult Note (Signed)
CARDIOLOGY CONSULT NOTE  Patient ID: [name] MRN: [id number] DOB/AGE: 04-17-1929 79 y.o.  Admit date: 01/24/2013 Primary Physician: Margo Common Primary Cardiologist: Antoine Poche Reason for Consultation: atrial fibrillation  HPI:  79 yo with history of paroxysmal atrial fibrillation, VT, CAD s/p CABG, and ischemic cardiomyopathy now with right-sided PNA and atrial fibrillation/RVR.  Patient was admitted initially to [facility] with dyspnea, diaphoresis, and hemoptysis.  He had a CT chest showing a right-sided PNA. (extensive).  He was transferred to [facility] on 6/21.  He has been on antibiotics and currently requires oxygen by nasal cannula.  Warfarin was held with hemoptysis.  Last night, he went into atrial fibrillation with RVR.  HR now in 110s.  SBP in 90s-110s.  He feels ok, no dyspnea at rest.  No chest pain.  Occasional hemoptysis if he coughs a lot.   Review of systems complete and found to be negative unless listed above in HPI  Past Medical History: 1. HTN 2. Atrial fibrillation: Paroxysmal according to prior notes.  On coumadin.  3. COPD on chronic prednisone.  4. GERD 5. CAD: CABG 8/07 with last cath in 8/13 showing patent grafts.  6. Hyperlipidemia 7. Diet-controlled diabetes 8. VT: Has St Jude ICD.  Catheter ablation in 4/14, now on sotalol. 9. Ischemic cardiomyopathy: Echo (5/14) with EF 30-35%, posterior and inferior akinesis, apical septal akinesis, restrictive diastolic function, moderate MR, mildly decreased RV function.  10. Carotid disease: s/p left CEA.   Family history: CAD  History   Social History  . Marital Status: Married    Spouse Name: N/A    Number of Children: N/A  . Years of Education: N/A   Occupational History  . RETIRED    Social History Main Topics  . Smoking status: Former Smoker -- 2.00 packs/day for 60 years    Types: Cigarettes    Quit date: 08/06/1990  . Smokeless tobacco: Never Used  . Alcohol Use: No  . Drug Use: No  .  Sexually Active: Yes   Other Topics Concern  . Not on file   Social History Narrative   Pt does not get regular exercise.     Prescriptions prior to admission  Medication Sig Dispense Refill  . albuterol (PROVENTIL HFA;VENTOLIN HFA) 108 (90 BASE) MCG/ACT inhaler Inhale 2 puffs into the lungs every 6 (six) hours as needed for wheezing or shortness of breath.      . budesonide (PULMICORT) 0.5 MG/2ML nebulizer solution Take 0.5 mg by nebulization 2 (two) times daily.      [name] docusate sodium (COLACE) 100 MG capsule Take 100 mg by mouth 2 (two) times daily as needed for constipation.      . fluticasone (FLONASE) 50 MCG/ACT nasal spray Place 2 sprays into the nose daily.      . formoterol (PERFOROMIST) 20 MCG/2ML nebulizer solution Take 20 mcg by nebulization 2 (two) times daily.      . furosemide (LASIX) 40 MG tablet Take 60 mg by mouth daily.      [name] loratadine (CLARITIN) 10 MG tablet Take 10 mg by mouth daily. For allergies      . megestrol (MEGACE) 40 MG/ML suspension Take by mouth as needed. Take 2 teaspoonfuls (10ml) by mouth daily      . metoprolol succinate (TOPROL-XL) 50 MG 24 hr tablet Take 1 tablet (50 mg total) by mouth daily.  30 tablet  6  . nitroGLYCERIN (NITROSTAT) 0.4 MG SL tablet Place 1 tablet (0.4 mg total) under the  tongue every 5 (five) minutes as needed for chest pain.  25 each  3  . omeprazole (PRILOSEC) 20 MG capsule Take 20 mg by mouth daily.      [name] oxyCODONE-acetaminophen (PERCOCET) 5-325 MG per tablet Take 1-2 tablets by mouth every 6 (six) hours as needed for pain.      . predniSONE (DELTASONE) 5 MG tablet Take 5 mg by mouth daily.       . simvastatin (ZOCOR) 40 MG tablet Take 40 mg by mouth at bedtime.        . sotalol (BETAPACE) 120 MG tablet Take 1 tablet (120 mg total) by mouth 2 (two) times daily.  60 tablet  6  . spironolactone (ALDACTONE) 25 MG tablet Take 25 mg by mouth daily.      [name] warfarin (COUMADIN) 5 MG tablet Take 2.5-5 mg by mouth daily. Took 5 mg (1  tablet) on Mondays, Wednesdays, and Fridays On other days, took 2.5 mg (1/2 tablet)      . [DISCONTINUED] warfarin (COUMADIN) 5 MG tablet Take as directed by coumadin clinic  30 tablet  1    Physical exam Blood pressure 104/56, pulse 112, temperature 97.6 F (36.4 C), temperature source Oral, resp. rate 18, height 5\' 9"  (1.753 m), weight 152 lb 12.5 oz (69.3 kg), SpO2 98.00%. General: NAD Neck: JVP 8-9 cm, no thyromegaly or thyroid nodule.  Lungs: Crackles right base.  CV: Nondisplaced PMI.  Heart mildly tachycardiac, irregular S1/S2, no S3/S4, no murmur.  No peripheral edema.  No carotid bruit.   Abdomen: Soft, nontender, no hepatosplenomegaly, no distention.  Skin: Intact without lesions or rashes.  Neurologic: Alert and oriented x 3.  Psych: Normal affect. Extremities: No clubbing or cyanosis.  HEENT: Normal.   Labs:   Lab Results  Component Value Date   WBC 12.4* 01/26/2013   HGB 8.5* 01/26/2013   HCT 25.7* 01/26/2013   MCV 82.9 01/26/2013   PLT 240 01/26/2013    Recent Labs Lab 01/26/13 0350  NA 129*  K 3.6  CL 94*  CO2 25  BUN 23  CREATININE 0.91  CALCIUM 8.8  GLUCOSE 115*   Lab Results  Component Value Date   CKTOTAL 140 01/25/2013   CKMB 2.8 01/25/2013   TROPONINI <0.30 01/25/2013    Lab Results  Component Value Date   CHOL 134 03/06/2012   Lab Results  Component Value Date   HDL 32* 03/06/2012   Lab Results  Component Value Date   LDLCALC 67 03/06/2012   Lab Results  Component Value Date   TRIG 176* 03/06/2012   Lab Results  Component Value Date   CHOLHDL 4.2 03/06/2012   No results found for this basename: LDLDIRECT      Radiology: - CT chest: Emphysema, right-sided PNA  EKG: atrial fibrillation at 114  ASSESSMENT AND PLAN:  79 yo with history of paroxysmal atrial fibrillation, VT, CAD s/p CABG, and ischemic cardiomyopathy now with right-sided PNA and atrial fibrillation/RVR. 1. Atrial fibrillation: Per notes, paroxysmal.  He is currently in atrial  fibrillation with RVR.  Toprol XL has been on hold but he has been getting sotalol.  He received 0.25 mg IV digoxin this morning.  Coumadin was held with hemoptysis.  - Hemoptysis seem to be subsiding.  If primary team agrees, would restart coumadin.  - Continue sotalol.  Would recheck ECG for QT interval when rate is more controlled.  - Add back Toprol XL, dose 25 mg bid for now.  - start  digoxin 0.125 mg daily, can give first dose tonight.  - Would interrogate St Jude ICD tomorrow to assess burden of atrial fibrillation.  2. Chronic systolic CHF: EF 16-10% on recent echo.  Mild volume overload on exam.  Restart home Lasix at 60 mg po daily.  Can restart spironolactone as well.  3. PNA: Treating per primary team. 4. COPD: Treating per primary team. 5. CAD: Stable s/p CABG, no chest pain.  6. VT: No recurrence.  Continue sotalol.    Marca Ancona 01/26/2013 6:53 PM

## 2013-01-26 NOTE — Progress Notes (Signed)
UR Completed Rex Magee Graves-Bigelow, RN,BSN [phone]  

## 2013-01-26 NOTE — Care Management Note (Signed)
    Page 1 of 2   01/28/2013     11:36:08 AM   CARE MANAGEMENT NOTE 01/28/2013  Patient:  [name], [name]   Account Number:  [id number]  Date Initiated:  01/26/2013  Documentation initiated by:  GRAVES-BIGELOW,BRENDA  Subjective/Objective Assessment:   Pt admitted with PNA. Treating with IV ABX. Plan for home when medically stable. Per PT notes HH PT recommended.     Action/Plan:   CM will continue to monitor.   Anticipated DC Date:  01/28/2013   Anticipated DC Plan:  HOME W HOME HEALTH SERVICES      DC Planning Services  CM consult      [facility] Choice  HOME HEALTH   Choice offered to / List presented to:  C-1 Patient        HH arranged  HH-1 RN  HH-2 PT  HH-3 OT      [facility] agency  [facility]   Status of service:  Completed, signed off Medicare Important Message given?   (If response is "NO", the following Medicare IM given date fields will be blank) Date Medicare IM given:   Date Additional Medicare IM given:    Discharge Disposition:  HOME W HOME HEALTH SERVICES  Per UR Regulation:  Reviewed for med. necessity/level of care/duration of stay  If discussed at Long Length of Stay Meetings, dates discussed:    Comments:  01/28/13- 1115- Donn Pierini RN, BSN [phone] Pt for d/c today with HH orders for RN/PT/OT- pt had refused HH yest. - in to speak with pt again- wife pt at bedside- explained to pt MD orders and reason for recommendations- pt agreeable at this time for [facility]- "short term" per wife pt goes to the [facility] coumadin clinic for his INR checks- explained that the HH-RN will check his INR this friday then his MD will assess when his next check needed. List for [facility] agencies in [place] given to pt and wife per choice they would like to use [facility] for [facility] services- referral called to Lupita Leash with [facility]- for HH-RN/PT/OT with INR check on 01/30/13.   01-27-13 1440 Tomi Bamberger, RN,BSN [phone] CM did speak to pt and wife to discuss  [facility] options. Pt is  refusing HH Services at this time. He states he has had HH services in the past and it did not work out. Pt states he and his wife  will do fine without any services. No further needs  from CM at this time.

## 2013-01-27 ENCOUNTER — Ambulatory Visit: Payer: Self-pay | Admitting: Cardiology

## 2013-01-27 DIAGNOSIS — Z951 Presence of aortocoronary bypass graft: Secondary | ICD-10-CM

## 2013-01-27 DIAGNOSIS — Z7901 Long term (current) use of anticoagulants: Secondary | ICD-10-CM

## 2013-01-27 DIAGNOSIS — Z95810 Presence of automatic (implantable) cardiac defibrillator: Secondary | ICD-10-CM

## 2013-01-27 LAB — BASIC METABOLIC PANEL
Calcium: 9.1 mg/dL (ref 8.4–10.5)
Chloride: 96 mEq/L (ref 96–112)
Creatinine, Ser: 0.89 mg/dL (ref 0.50–1.35)
GFR calc Af Amer: >90 mL/min (ref >90)

## 2013-01-27 LAB — PROTIME-INR
INR: 1.54 — ABNORMAL HIGH (ref 0.00–1.49)
Prothrombin Time: 18 seconds — ABNORMAL HIGH (ref 11.6–15.2)

## 2013-01-27 MED ORDER — WARFARIN - PHARMACIST DOSING INPATIENT
Freq: Every day | Status: DC
  Administered 2013-01-27: 18:00:00

## 2013-01-27 MED ORDER — WARFARIN SODIUM 2.5 MG PO TABS
2.5000 mg | ORAL_TABLET | Freq: Once | ORAL | Status: AC
  Administered 2013-01-27: 2.5 mg via ORAL
  Filled 2013-01-27: qty 1

## 2013-01-27 NOTE — Progress Notes (Signed)
Patient ID: [name], male   DOB: 06-03-1929, 79 y.o.   MRN: [id number]    I have been informed by Joice Lofts that [name] interrogated the patient's ICD. He had the episode of atrial fib that lasted for 23 hours yesterday. Other than this he has had no significant recent atrial fib. There was no ventricular tachycardia. Device function was normal.  Jerral Bonito, MD

## 2013-01-27 NOTE — Progress Notes (Signed)
Patient ID: [name]  male  ZOX:[id number]    DOB: [birth year]    DOA: 01/24/2013  PCP: Louie Boston, MD   Interim summary:  Patient is a 79 year old male with multiple medical problems including atrial fibrillation on Coumadin, COPD, on maintenance prednisone, GERD, CHF, history of end-stage, CABG, ICD, hyperlipidemia, diet-controlled diabetes, presented to [facility] shortness of breath. Patient reported chronic shortness of breath, coughing with history of COPD but on the morning of admission,  woke up with diaphoresis, difficulty breathing and hemoptysis. Patient is on Coumadin for atrial fibrillation. He also reports that he was started on Percocet a week ago for back pain which was thought to be from kidney stone. This caused significant constipation and he has not had a bowel movement for a week. Patient was found to be in hypoxic respiratory failure at [facility] and required O2 supplementation at 4 L. Patient denied being on oxygen at home. Chest x-ray at [facility] showed extensive right-sided pneumonia and possible alveolar hemorrhage. D-dimer was elevated at 3.6, CT angiogram of the chest did not show acute PE. Patient was initially admitted to step down unit and transferred to tele floor on 01/25/13. On 6/23 am, patient was noted to be in rapid A. Fib, hypotensive, cardiology was requested to follow the patient.  Consults: Cardiology- [facility]  Assessment/Plan: Principal Problem:  Atrial fibrillation with RVR: Has chronic A. fib, - Now stable, cardiology has seen the patient, placed back on sotalol and Toprol-XL, started on digoxin - Restart Coumadin today - ICD to be interrogated today - will need close f/u with his primary cardiologist, Dr. Antoine Poche after dc  Acute respiratory failure with hypoxia and hemoptysis secondary to right-sided pneumonia  - Continue O2 supplementation, scheduled nebulizer treatments, on IV Rocephin and Zithromax  - also placed on  prednisone for COPD  - No pulmonary embolism on CT angiogram.   DYSLIPIDEMIA  - continue statins   HYPERTENSION:  - Currently borderline soft,  ISCHEMIC CARDIOMYOPATHY/ chronic systolic and diastolic CHF  - serial cardiac enzymes negative  - ECHO from 5/29 revealed EF 30-35 % and diastolic dysfunction with akinesis of the entire inferior and mid inferolateral myocardium.   Hyponatremia  - Stable  DVT Prophylaxis: Restart Coumadin today  Code Status: Full code  Disposition: PT OT evaluation today, hopefully DC home tomorrow if no more hemoptysis after starting coumadin and stable    Subjective: HR more controlled today, feels a whole lot better today.   Objective: Weight change:   Intake/Output Summary (Last 24 hours) at 01/27/13 1316 Last data filed at 01/27/13 1000  Gross per 24 hour  Intake 2415.5 ml  Output   3375 ml  Net -959.5 ml   Blood pressure 104/53, pulse 82, temperature 98 F (36.7 C), temperature source Oral, resp. rate 18, height 5\' 9"  (1.753 m), weight 69.3 kg (152 lb 12.5 oz), SpO2 98.00%.  Physical Exam: General: A X O x3, not in any acute distress. CVS: Tachycardia, irregularly irregular Chest: clear to auscultation bilaterally, no wheezing, rales or rhonchi Abdomen: soft NT, ND, NBS  Extremities: no cyanosis, clubbing or edema noted bilaterally   Lab Results: Basic Metabolic Panel:  Recent Labs Lab 01/26/13 0350 01/27/13 0410  NA 129* 135  K 3.6 3.7  CL 94* 96  CO2 25 26  GLUCOSE 115* 105*  BUN 23 21  CREATININE 0.91 0.89  CALCIUM 8.8 9.1   Liver Function Tests: No results found for this basename: AST, ALT, ALKPHOS, BILITOT, PROT,  ALBUMIN,  in the last 168 hours No results found for this basename: LIPASE, AMYLASE,  in the last 168 hours No results found for this basename: AMMONIA,  in the last 168 hours CBC:  Recent Labs Lab 01/25/13 0400 01/26/13 0350  WBC 14.4* 12.4*  HGB 8.6* 8.5*  HCT 26.5* 25.7*  MCV 84.4 82.9  PLT  242 240   Cardiac Enzymes:  Recent Labs Lab 01/24/13 1728 01/24/13 2224 01/25/13 0400  CKTOTAL 138 131 140  CKMB 3.1 2.8 2.8  TROPONINI <0.30 <0.30 <0.30   BNP: No components found with this basename: POCBNP,  CBG:  Recent Labs Lab 01/24/13 2213  GLUCAP 134*     Micro Results: Recent Results (from the past 240 hour(s))  MRSA PCR SCREENING     Status: None   Collection Time    01/24/13  2:42 PM      Result Value Range Status   MRSA by PCR NEGATIVE  NEGATIVE Final   Comment:            The GeneXpert MRSA Assay (FDA     approved for NASAL specimens     only), is one component of a     comprehensive MRSA colonization     surveillance program. It is not     intended to diagnose MRSA     infection nor to guide or     monitor treatment for     MRSA infections.  URINE CULTURE     Status: None   Collection Time    01/24/13  5:21 PM      Result Value Range Status   Specimen Description URINE, CLEAN CATCH   Final   Special Requests Normal   Final   Culture  Setup Time 01/25/2013 03:10   Final   Colony Count NO GROWTH   Final   Culture NO GROWTH   Final   Report Status 01/26/2013 FINAL   Final  CULTURE, BLOOD (ROUTINE X 2)     Status: None   Collection Time    01/24/13  5:30 PM      Result Value Range Status   Specimen Description BLOOD LEFT ARM   Final   Special Requests BOTTLES DRAWN AEROBIC ONLY 7CC   Final   Culture  Setup Time 01/24/2013 22:05   Final   Culture     Final   Value:        BLOOD CULTURE RECEIVED NO GROWTH TO DATE CULTURE WILL BE HELD FOR 5 DAYS BEFORE ISSUING A FINAL NEGATIVE REPORT   Report Status PENDING   Incomplete  CULTURE, BLOOD (ROUTINE X 2)     Status: None   Collection Time    01/24/13  5:35 PM      Result Value Range Status   Specimen Description BLOOD LEFT HAND   Final   Special Requests BOTTLES DRAWN AEROBIC ONLY 3CC   Final   Culture  Setup Time 01/24/2013 22:05   Final   Culture     Final   Value:        BLOOD CULTURE RECEIVED  NO GROWTH TO DATE CULTURE WILL BE HELD FOR 5 DAYS BEFORE ISSUING A FINAL NEGATIVE REPORT   Report Status PENDING   Incomplete    Studies/Results: Dg Chest Port 1 View  01/25/2013   *RADIOLOGY REPORT*  Clinical Data: Dyspnea.  PORTABLE CHEST - 1 VIEW  Comparison: January 24, 2013.  Findings: Status post coronary artery bypass graft.  Left-sided pacemaker is unchanged.  Stable cardiomediastinal silhouette.  Left lung is clear.  The right lung opacity is improved consistent with improving pneumonia.  Left lung nodule seen on prior exam is not well visualized currently.  IMPRESSION: Improved right lung opacity consistent with improving pneumonia.   Original Report Authenticated By: Lupita Raider.,  M.D.    Medications: Scheduled Meds: . arformoterol  15 mcg Nebulization Q12H  . azithromycin  500 mg Intravenous Q24H  . budesonide  0.5 mg Nebulization BID  . cefTRIAXone (ROCEPHIN)  IV  1 g Intravenous Q24H  . digoxin  0.125 mg Oral Daily  . fluticasone  2 spray Each Nare Daily  . furosemide  60 mg Oral Daily  . levalbuterol  0.63 mg Nebulization TID   And  . ipratropium  0.5 mg Nebulization TID  . loratadine  10 mg Oral Daily  . megestrol  400 mg Oral BID  . metoprolol succinate  25 mg Oral BID  . pantoprazole  40 mg Oral Q breakfast  . polyethylene glycol  17 g Oral Daily  . predniSONE  40 mg Oral Q breakfast  . simvastatin  40 mg Oral QHS  . sotalol  120 mg Oral BID  . spironolactone  25 mg Oral Daily  . warfarin  2.5 mg Oral ONCE-1800  . Warfarin - Pharmacist Dosing Inpatient   Does not apply q1800      LOS: 3 days   RAI,RIPUDEEP M.D. [facility] 01/27/2013, 1:16 PM Pager: [phone]  If 7PM-7AM, please contact night-coverage [email or web address] Password [email or web address]

## 2013-01-27 NOTE — Progress Notes (Signed)
Physical Therapy Treatment Patient Details Name: [name] MRN: [id number] DOB: [birth year] Today's Date: 01/27/2013 Time: 1007-1020 PT Time Calculation (min): 13 min  PT Assessment / Plan / Recommendation Comments on Treatment Session  79 y.o. male admitted to [facility] for SOB. Dx with PNA.  He presents today doing better per pt report.  His O2 sats while walking 87% on RA with DOE 2/4.  He is able to increase to above 90% with short <20 sec standing rest break and pursed lip breathing.  He has poor endurance and decreased balance and gait speed. He would benefit from HHPT f/u at discharge.      Follow Up Recommendations  Home health PT;Supervision/Assistance - 24 hour     Does the patient have the potential to tolerate intense rehabilitation    NA  Barriers to Discharge   none      Equipment Recommendations  None recommended by PT    Recommendations for Other Services   none  Frequency Min 3X/week   Plan Discharge plan remains appropriate;Frequency remains appropriate    Precautions / Restrictions   fall  Pertinent Vitals/Pain O2 sats decreased to 87% on RA with gait DOE 2/4.  O2 sats increased to >90% on RA in < 20 seconds with standing rest break and pursed lip breathing during gait.      Mobility  Bed Mobility Supine to Sit: 6: Modified independent (Device/Increase time);With rails;HOB elevated Sitting - Scoot to Edge of Bed: 6: Modified independent (Device/Increase time);With rail Sit to Supine: 6: Modified independent (Device/Increase time);With rail;HOB elevated Details for Bed Mobility Assistance: used railing to get to sitting EOB.   Transfers Transfers: Sit to Stand;Stand to Sit Sit to Stand: 4: Min guard;With upper extremity assist;From bed Stand to Sit: 4: Min guard;With upper extremity assist;To bed Details for Transfer Assistance: min guard assist to steady pt for balance during transition Ambulation/Gait Ambulation/Gait Assistance: 4: Min assist;5:  Supervision Ambulation Distance (Feet): 250 Feet Assistive device: 1 person hand held assist Ambulation/Gait Assistance Details: started out supervision, but with increased gait distance pt reported increased leg weakness and DOE, so last 100' provided min hand held assist and 2-3 standing rest breaks needed.   Gait Pattern: Step-through pattern;Trunk flexed Gait velocity: less than 1.8 ft/sec indicating risk for recurrent falls.       PT Goals Acute Rehab PT Goals PT Goal: Supine/Side to Sit - Progress: Progressing toward goal PT Goal: Sit to Stand - Progress: Progressing toward goal PT Goal: Stand to Sit - Progress: Progressing toward goal PT Goal: Ambulate - Progress: Progressing toward goal  Visit Information  Last PT Received On: 01/27/13 Assistance Needed: +1    Subjective Data  Subjective: Pt reports that his breating feels much better.  He reports he started lasix and was up every two hours last night.   Patient Stated Goal: to go home   Cognition  Cognition Arousal/Alertness: Awake/alert Behavior During Therapy: WFL for tasks assessed/performed Overall Cognitive Status: Within Functional Limits for tasks assessed    Balance  Dynamic Standing Balance Dynamic Standing - Balance Support: No upper extremity supported;Left upper extremity supported Dynamic Standing - Level of Assistance: 5: Stand by assistance;4: Min assist Dynamic Standing - Comments: supervision with upper extremity supported, min assist without upper extremity supported.    End of Session PT - End of Session Activity Tolerance: Patient limited by fatigue Patient left: in bed;with call bell/phone within reach;with family/visitor present (wife in room) Nurse Communication: Mobility status (pt to walk  with wife 2 more times today)    Lurena Joiner B. Alec Mcphee, PT, DPT [phone]   01/27/2013, 11:27 AM

## 2013-01-27 NOTE — Progress Notes (Signed)
Patient ID: [name], male   DOB: Mar 05, 1929, 79 y.o.   MRN: [id number]   SUBJECTIVE:  Patient is feeling much better today. He diuresis over the night. He never has significant edema. However when he has volume overload, his abdomen becomes distended. This is also improving today. Renal function today is stable. His rhythm appears to be back to sinus with atrial pacing.   Filed Vitals:   01/26/13 2000 01/26/13 2128 01/26/13 2134 01/27/13 0441  BP: 119/65   104/53  Pulse: 78   82  Temp: 97.5 F (36.4 C)   98 F (36.7 C)  TempSrc:      Resp: 18   18  Height:      Weight:      SpO2: 99% 98% 98% 97%    Intake/Output Summary (Last 24 hours) at 01/27/13 0742 Last data filed at 01/27/13 0400  Gross per 24 hour  Intake 2175.5 ml  Output   2250 ml  Net  -74.5 ml    LABS: Basic Metabolic Panel:  Recent Labs  78/29/56 0350 01/27/13 0410  NA 129* 135  K 3.6 3.7  CL 94* 96  CO2 25 26  GLUCOSE 115* 105*  BUN 23 21  CREATININE 0.91 0.89  CALCIUM 8.8 9.1   Liver Function Tests: No results found for this basename: AST, ALT, ALKPHOS, BILITOT, PROT, ALBUMIN,  in the last 72 hours No results found for this basename: LIPASE, AMYLASE,  in the last 72 hours CBC:  Recent Labs  01/25/13 0400 01/26/13 0350  WBC 14.4* 12.4*  HGB 8.6* 8.5*  HCT 26.5* 25.7*  MCV 84.4 82.9  PLT 242 240   Cardiac Enzymes:  Recent Labs  01/24/13 1728 01/24/13 2224 01/25/13 0400  CKTOTAL 138 131 140  CKMB 3.1 2.8 2.8  TROPONINI <0.30 <0.30 <0.30   BNP: No components found with this basename: POCBNP,  D-Dimer: No results found for this basename: DDIMER,  in the last 72 hours Hemoglobin A1C: No results found for this basename: HGBA1C,  in the last 72 hours Fasting Lipid Panel: No results found for this basename: CHOL, HDL, LDLCALC, TRIG, CHOLHDL, LDLDIRECT,  in the last 72 hours Thyroid Function Tests: No results found for this basename: TSH, T4TOTAL, FREET3, T3FREE, THYROIDAB,  in  the last 72 hours  RADIOLOGY: Dg Chest Port 1 View  01/25/2013   *RADIOLOGY REPORT*  Clinical Data: Dyspnea.  PORTABLE CHEST - 1 VIEW  Comparison: January 24, 2013.  Findings: Status post coronary artery bypass graft.  Left-sided pacemaker is unchanged.  Stable cardiomediastinal silhouette.  Left lung is clear.  The right lung opacity is improved consistent with improving pneumonia.  Left lung nodule seen on prior exam is not well visualized currently.  IMPRESSION: Improved right lung opacity consistent with improving pneumonia.   Original Report Authenticated By: Lupita Raider.,  M.D.    PHYSICAL EXAM  patient is oriented to person time and place. Affect is normal. His wife is in the room. He is lying flat in bed comfortably. There is no jugulovenous distention. Lungs are no scattered rhonchi. Cardiac exam reveals an S1 and S2. The abdomen is soft. There is no significant peripheral edema.   TELEMETRY: I have reviewed telemetry today January 27, 2013. There is atrial pacing.   ASSESSMENT AND PLAN:    Acute respiratory failure with hypoxia      This is improving.    ISCHEMIC CARDIOMYOPATHY     The patient is on a beta blocker  and spironolactone. He is not on an ACE inhibitor. He was not on an ACE inhibitor as an outpatient. I have reviewed the records.. At this time I will plan to check with Dr. Antoine Poche to get more information about the patient's prior medications. This is a very complex patient. I'm hesitant to re\re add any medications that may have been stopped in the past.    Coronary artery disease    Atrial fibrillation     It appears that the patient now is back in sinus with atrial pacing. We will continue the current medications. I will arrange for his ICD to be interrogated.    Hemoptysis     This is improved. It appears to been related to his pulmonary illness. His Foley anticoagulation can be resumed.    CAP (community acquired pneumonia)    This is being treated.    Systolic  and diastolic CHF, chronic      He is back on his home oral diuretics. Plan to continue these for now.    Warfarin anticoagulation      Plan to resume his Coumadin.    Hx of CABG    ICD (implantable cardioverter-defibrillator) in place     I will arrange for his ICD to be interrogated.   Willa Rough 01/27/2013 7:42 AM

## 2013-01-27 NOTE — Progress Notes (Signed)
ANTICOAGULATION CONSULT NOTE - Initial Consult  Pharmacy Consult for Warfarin Indication: atrial fibrillation  No Known Allergies  Patient Measurements: Height: 5\' 9"  (175.3 cm) Weight: 152 lb 12.5 oz (69.3 kg) IBW/kg (Calculated) : 70.7  Vital Signs: Temp: 98 F (36.7 C) (06/24 0441) BP: 104/53 mmHg (06/24 0441) Pulse Rate: 82 (06/24 0441)  Labs:  Recent Labs  01/24/13 1727 01/24/13 1728 01/24/13 2224 01/25/13 0400 01/25/13 0935 01/26/13 0350 01/26/13 1616 01/27/13 0410  HGB 9.4*  --   --  8.6*  --  8.5*  --   --   HCT 29.3*  --   --  26.5*  --  25.7*  --   --   PLT 248  --   --  242  --  240  --   --   LABPROT  --   --   --   --  26.4*  --  18.2* 18.0*  INR  --   --   --   --  2.58*  --  1.56* 1.54*  CREATININE 0.84  --   --  0.80  --  0.91  --  0.89  CKTOTAL  --  138 131 140  --   --   --   --   CKMB  --  3.1 2.8 2.8  --   --   --   --   TROPONINI  --  <0.30 <0.30 <0.30  --   --   --   --     Estimated Creatinine Clearance: 63.8 ml/min (by C-G formula based on Cr of 0.89).   Medical History: Past Medical History  Diagnosis Date  . Hypertension   . Coronary artery disease     a. CABG 03/2006. b. NSTEMI 03/2012 following VT likely demand ischemia - grafts patent at cath.  . Diabetes mellitus   . Other specified forms of chronic ischemic heart disease   . Syncope and collapse   . Automatic implantable cardiac defibrillator in situ     QRS duration 120 ms no typical left bundle branch block.  . Atrial fibrillation     Taking sotalol, intolerant to amiodarone  . Valvular heart disease     Mild to moderate mitral valve insufficiency and mild aortic insufficiency  . Chronic airway obstruction, not elsewhere classified   . Old myocardial infarction   . Other and unspecified hyperlipidemia   . Congestive heart failure, unspecified     a. Ischemic cardiomyopathy ejection fraction 30-35% previously. b. Down to 15% by cath 03/2012.  [name] Cerebrovascular disease,  unspecified   . Other mechanical complication of other internal orthopedic device, implant, and graft   . Carotid artery disease     s/p LCEA 2007.  [name] Head and neck cancer     Receiving radiation therapy.  . Orthostatic hypotension   . Ventricular tachycardia     a. S/p ICD implantation. b. Recurrent in 03/2012 with medication adjustment.;  c. s/p RFCA 11/2012 (Dr. Fawn Kirk)     Assessment: 79 y.o. M on warfarin PTA for hx Afib -- held initially this admit due to hemoptysis noted PTA. CXR at [facility] showed possible focal alveolar hemorrhage. The patient's hemoptysis has noted to be resolved -- the patient only has slight pink-tinged sputum when he coughs "really hard". The physician has consulted pharmacy to resume warfarin dosing today for hx Afib.   PTA the patient was known to be taking 2.5 mg daily EXCEPT for 5 mg on MWF (confirmed with [facility] coumadin  clinic notes). The patient did have an elevated INR of 5.7 noted on 6/17 at which time the patient was instructed to hold warfarin x 3 days, then resume home dose. The patient is on a prednisone taper and antibiotics this admission for respiratory failure/PNA which will increase warfarin sensitivity. Will resume warfarin doses cautiously.   Goal of Therapy:  INR 2-3 Monitor platelets by anticoagulation protocol: Yes   Plan:  1. Warfarin 2.5 mg x 1 dose at 1800 today 2. Daily PT/INR 3. Will continue to monitor for any signs/symptoms of bleeding and will follow up with PT/INR in the a.m.   Georgina Pillion, PharmD, BCPS Clinical Pharmacist Pager: [phone] 01/27/2013 1:13 PM

## 2013-01-28 LAB — BASIC METABOLIC PANEL
Chloride: 97 mEq/L (ref 96–112)
Creatinine, Ser: 0.94 mg/dL (ref 0.50–1.35)
GFR calc Af Amer: 88 mL/min — ABNORMAL LOW (ref >90)

## 2013-01-28 LAB — PROTIME-INR: Prothrombin Time: 16.3 seconds — ABNORMAL HIGH (ref 11.6–15.2)

## 2013-01-28 MED ORDER — LOSARTAN POTASSIUM 25 MG PO TABS
25.0000 mg | ORAL_TABLET | Freq: Every day | ORAL | Status: DC
  Administered 2013-01-28: 25 mg via ORAL
  Filled 2013-01-28: qty 1

## 2013-01-28 MED ORDER — WARFARIN SODIUM 5 MG PO TABS: 2.5000 mg | ORAL_TABLET | Freq: Every day | ORAL | Status: DC

## 2013-01-28 MED ORDER — WARFARIN SODIUM 2.5 MG PO TABS
2.5000 mg | ORAL_TABLET | Freq: Once | ORAL | Status: DC
  Filled 2013-01-28: qty 1

## 2013-01-28 MED ORDER — DIGOXIN 125 MCG PO TABS: 0.1250 mg | ORAL_TABLET | Freq: Every day | ORAL | Status: DC

## 2013-01-28 MED ORDER — LOSARTAN POTASSIUM 25 MG PO TABS: 12.5000 mg | ORAL_TABLET | Freq: Every day | ORAL | Status: DC

## 2013-01-28 MED ORDER — CEFDINIR 300 MG PO CAPS: 600.0000 mg | ORAL_CAPSULE | Freq: Two times a day (BID) | ORAL | Status: AC

## 2013-01-28 MED ORDER — AZITHROMYCIN 500 MG PO TABS: 500.0000 mg | ORAL_TABLET | Freq: Every day | ORAL | Status: AC

## 2013-01-28 NOTE — Discharge Summary (Signed)
Physician Discharge Summary  [name] MRN: [id number] DOB/AGE: 1928-12-02 79 y.o.  PCP: Louie Boston, MD   Admit date: 01/24/2013 Discharge date: 01/28/2013  Discharge Diagnoses:      CAP (community acquired pneumonia) Active Problems:   DM   DYSLIPIDEMIA   HYPERTENSION   ISCHEMIC CARDIOMYOPATHY   Coronary artery disease   Atrial fibrillation   Acute respiratory failure with hypoxia   Hemoptysis   Unspecified constipation   Systolic and diastolic CHF, chronic   Warfarin anticoagulation   Hx of CABG   ICD (implantable cardioverter-defibrillator) in place     Medication List    TAKE these medications       albuterol 108 (90 BASE) MCG/ACT inhaler  Commonly known as:  PROVENTIL HFA;VENTOLIN HFA  Inhale 2 puffs into the lungs every 6 (six) hours as needed for wheezing or shortness of breath.     azithromycin 500 MG tablet  Commonly known as:  ZITHROMAX  Take 1 tablet (500 mg total) by mouth daily.     budesonide 0.5 MG/2ML nebulizer solution  Commonly known as:  PULMICORT  Take 0.5 mg by nebulization 2 (two) times daily.     cefdinir 300 MG capsule  Commonly known as:  OMNICEF  Take 2 capsules (600 mg total) by mouth 2 (two) times daily.     digoxin 0.125 MG tablet  Commonly known as:  LANOXIN  Take 1 tablet (0.125 mg total) by mouth daily.     docusate sodium 100 MG capsule  Commonly known as:  COLACE  Take 100 mg by mouth 2 (two) times daily as needed for constipation.     fluticasone 50 MCG/ACT nasal spray  Commonly known as:  FLONASE  Place 2 sprays into the nose daily.     formoterol 20 MCG/2ML nebulizer solution  Commonly known as:  PERFOROMIST  Take 20 mcg by nebulization 2 (two) times daily.     furosemide 40 MG tablet  Commonly known as:  LASIX  Take 60 mg by mouth daily.     loratadine 10 MG tablet  Commonly known as:  CLARITIN  Take 10 mg by mouth daily. For allergies     losartan 25 MG tablet  Commonly known as:  COZAAR   Take 0.5 tablets (12.5 mg total) by mouth daily.     megestrol 40 MG/ML suspension  Commonly known as:  MEGACE  Take by mouth as needed. Take 2 teaspoonfuls (10ml) by mouth daily     metoprolol succinate 50 MG 24 hr tablet  Commonly known as:  TOPROL-XL  Take 1 tablet (50 mg total) by mouth daily.     nitroGLYCERIN 0.4 MG SL tablet  Commonly known as:  NITROSTAT  Place 1 tablet (0.4 mg total) under the tongue every 5 (five) minutes as needed for chest pain.     omeprazole 20 MG capsule  Commonly known as:  PRILOSEC  Take 20 mg by mouth daily.     PERCOCET 5-325 MG per tablet  Generic drug:  oxyCODONE-acetaminophen  Take 1-2 tablets by mouth every 6 (six) hours as needed for pain.     predniSONE 5 MG tablet  Commonly known as:  DELTASONE  Take 5 mg by mouth daily.     simvastatin 40 MG tablet  Commonly known as:  ZOCOR  Take 40 mg by mouth at bedtime.     sotalol 120 MG tablet  Commonly known as:  BETAPACE  Take 1 tablet (120 mg total) by  mouth 2 (two) times daily.     spironolactone 25 MG tablet  Commonly known as:  ALDACTONE  Take 25 mg by mouth daily.     warfarin 5 MG tablet  Commonly known as:  COUMADIN  Take 0.5-1 tablets (2.5-5 mg total) by mouth daily. Took 5 mg (1 tablet) on Mondays, Wednesdays, and Fridays  On other days, took 2.5 mg (1/2 tablet)        Discharge Condition:  Stable  Disposition: 01-Home or Self Care   Consults Cardiology    Significant Diagnostic Studies: Dg Chest [facility] 1 View  01/25/2013   *RADIOLOGY REPORT*  Clinical Data: Dyspnea.  PORTABLE CHEST - 1 VIEW  Comparison: January 24, 2013.  Findings: Status post coronary artery bypass graft.  Left-sided pacemaker is unchanged.  Stable cardiomediastinal silhouette.  Left lung is clear.  The right lung opacity is improved consistent with improving pneumonia.  Left lung nodule seen on prior exam is not well visualized currently.  IMPRESSION: Improved right lung opacity consistent with  improving pneumonia.   Original Report Authenticated By: Lupita Raider.,  M.D.      Microbiology: Recent Results (from the past 240 hour(s))  MRSA PCR SCREENING     Status: None   Collection Time    01/24/13  2:42 PM      Result Value Range Status   MRSA by PCR NEGATIVE  NEGATIVE Final   Comment:            The GeneXpert MRSA Assay (FDA     approved for NASAL specimens     only), is one component of a     comprehensive MRSA colonization     surveillance program. It is not     intended to diagnose MRSA     infection nor to guide or     monitor treatment for     MRSA infections.  URINE CULTURE     Status: None   Collection Time    01/24/13  5:21 PM      Result Value Range Status   Specimen Description URINE, CLEAN CATCH   Final   Special Requests Normal   Final   Culture  Setup Time 01/25/2013 03:10   Final   Colony Count NO GROWTH   Final   Culture NO GROWTH   Final   Report Status 01/26/2013 FINAL   Final  CULTURE, BLOOD (ROUTINE X 2)     Status: None   Collection Time    01/24/13  5:30 PM      Result Value Range Status   Specimen Description BLOOD LEFT ARM   Final   Special Requests BOTTLES DRAWN AEROBIC ONLY 7CC   Final   Culture  Setup Time 01/24/2013 22:05   Final   Culture     Final   Value:        BLOOD CULTURE RECEIVED NO GROWTH TO DATE CULTURE WILL BE HELD FOR 5 DAYS BEFORE ISSUING A FINAL NEGATIVE REPORT   Report Status PENDING   Incomplete  CULTURE, BLOOD (ROUTINE X 2)     Status: None   Collection Time    01/24/13  5:35 PM      Result Value Range Status   Specimen Description BLOOD LEFT HAND   Final   Special Requests BOTTLES DRAWN AEROBIC ONLY 3CC   Final   Culture  Setup Time 01/24/2013 22:05   Final   Culture     Final   Value:  BLOOD CULTURE RECEIVED NO GROWTH TO DATE CULTURE WILL BE HELD FOR 5 DAYS BEFORE ISSUING A FINAL NEGATIVE REPORT   Report Status PENDING   Incomplete     Labs: Results for orders placed during the hospital encounter  of 01/24/13 (from the past 48 hour(s))  PROTIME-INR     Status: Abnormal   Collection Time    01/26/13  4:16 PM      Result Value Range   Prothrombin Time 18.2 (*) 11.6 - 15.2 seconds   INR 1.56 (*) 0.00 - 1.49  PROTIME-INR     Status: Abnormal   Collection Time    01/27/13  4:10 AM      Result Value Range   Prothrombin Time 18.0 (*) 11.6 - 15.2 seconds   INR 1.54 (*) 0.00 - 1.49  BASIC METABOLIC PANEL     Status: Abnormal   Collection Time    01/27/13  4:10 AM      Result Value Range   Sodium 135  135 - 145 mEq/L   Potassium 3.7  3.5 - 5.1 mEq/L   Chloride 96  96 - 112 mEq/L   CO2 26  19 - 32 mEq/L   Glucose, Bld 105 (*) 70 - 99 mg/dL   BUN 21  6 - 23 mg/dL   Creatinine, Ser 1.61  0.50 - 1.35 mg/dL   Calcium 9.1  8.4 - 09.6 mg/dL   GFR calc non Af Amer 78 (*) >90 mL/min   GFR calc Af Amer >90  >90 mL/min   Comment:            The eGFR has been calculated     using the CKD EPI equation.     This calculation has not been     validated in all clinical     situations.     eGFR's persistently     <90 mL/min signify     possible Chronic Kidney Disease.  BASIC METABOLIC PANEL     Status: Abnormal   Collection Time    01/28/13  3:53 AM      Result Value Range   Sodium 133 (*) 135 - 145 mEq/L   Potassium 3.4 (*) 3.5 - 5.1 mEq/L   Chloride 97  96 - 112 mEq/L   CO2 27  19 - 32 mEq/L   Glucose, Bld 116 (*) 70 - 99 mg/dL   BUN 21  6 - 23 mg/dL   Creatinine, Ser 0.45  0.50 - 1.35 mg/dL   Calcium 8.8  8.4 - 40.9 mg/dL   GFR calc non Af Amer 76 (*) >90 mL/min   GFR calc Af Amer 88 (*) >90 mL/min   Comment:            The eGFR has been calculated     using the CKD EPI equation.     This calculation has not been     validated in all clinical     situations.     eGFR's persistently     <90 mL/min signify     possible Chronic Kidney Disease.  PROTIME-INR     Status: Abnormal   Collection Time    01/28/13  3:53 AM      Result Value Range   Prothrombin Time 16.3 (*) 11.6 -  15.2 seconds   INR 1.34  0.00 - 32.38    79 year old male with multiple medical problems including atrial fibrillation on Coumadin, COPD, on maintenance prednisone, GERD, CHF, history of end-stage, CABG, ICD,  hyperlipidemia, diet-controlled diabetes, presented to [facility] shortness of breath. Patient reported chronic shortness of breath, coughing with history of COPD but on the morning of admission, woke up with diaphoresis, difficulty breathing and hemoptysis. Patient is on Coumadin for atrial fibrillation. He also reports that he was started on Percocet a week ago for back pain which was thought to be from kidney stone. This caused significant constipation and he has not had a bowel movement for a week. Patient was found to be in hypoxic respiratory failure at [facility] and required O2 supplementation at 4 L. Patient denied being on oxygen at home. Chest x-[facility] at [facility] showed extensive right-sided pneumonia and possible alveolar hemorrhage. D-dimer was elevated at 3.6, CT angiogram of the chest did not show acute PE. Patient was initially admitted to step down unit and transferred to tele floor on 01/25/13. On 6/23 am, patient was noted to be in rapid A. Fib, hypotensive, cardiology was requested to follow the patient.   Consults:  Cardiology- [facility]  Assessment/Plan:  Principal Problem:  Atrial fibrillation with RVR: Has chronic A. fib,  - Now stable, cardiology has seen the patient, placed back on sotalol and Toprol-XL, started on digoxin  - Restart Coumadin yesterday, INR 1.34 ICD interrogated. Continue current therapy will need close f/u with his primary cardiologist, Dr. Antoine Poche after dc   Acute respiratory failure with hypoxia and hemoptysis secondary to right-sided pneumonia  - Continue O2 supplementation, scheduled nebulizer treatments, was on IV Rocephin and Zithromax , switched to omnicef and azithromycin  - also placed on prednisone for COPD  - No  pulmonary embolism on CT angiogram.   DYSLIPIDEMIA  - continue statins   HYPERTENSION:  - Currently borderline soft,   ISCHEMIC CARDIOMYOPATHY/ chronic systolic and diastolic CHF  - serial cardiac enzymes negative  - ECHO from 5/29 revealed EF 30-35 % and diastolic dysfunction with akinesis of the entire inferior and mid inferolateral myocardium.  Resumed all home meds ,cozaar to continue at 12.5 mg /day   Hyponatremia  - Stable   DVT Prophylaxis: Restart Coumadin today  Code Status: Full code  Disposition: PT OT evaluation today, hopefully DC home tomorrow if no more hemoptysis after starting coumadin and stable    Discharge Exam: *   Blood pressure 126/74, pulse 81, temperature 98.4 F (36.9 C), temperature source Oral, resp. rate 18, height 5\' 9"  (1.753 m), weight 65.318 kg (144 lb), SpO2 96.00%.   General: No distress  Lungs: Clear  Heart: RRR  Abdomen: Positive bowel sounds, no rebound no guarding  Extremities: No edema        Future Appointments Provider Department Dept Phone   03/30/2013 1:30 PM Hillis Range, MD [facility] (near [place]) [phone]      Follow-up Information   Follow up with TAPPER,DAVID B, MD. Schedule an appointment as soon as possible for a visit in 1 week. (inr to be followed by PCP )    Contact information:   [address] [place] [address] [phone]       Signed: Richarda Overlie 01/28/2013, 10:58 AM

## 2013-01-28 NOTE — Progress Notes (Signed)
ANTICOAGULATION CONSULT NOTE - Follow Up Consult  Pharmacy Consult for Coumadin Indication: atrial fibrillation  No Known Allergies  Patient Measurements: Height: 5\' 9"  (175.3 cm) Weight: 144 lb (65.318 kg) IBW/kg (Calculated) : 70.7  Vital Signs: Temp: 98.4 F (36.9 C) (06/25 0610) Temp src: Oral (06/25 0610) BP: 126/[name] mmHg (06/25 0610) Pulse Rate: 81 (06/25 0610)  Labs:  Recent Labs  01/26/13 0350 01/26/13 1616 01/27/13 0410 01/28/13 0353  HGB 8.5*  --   --   --   HCT 25.7*  --   --   --   PLT 240  --   --   --   LABPROT  --  18.2* 18.0* 16.3*  INR  --  1.56* 1.54* 1.34  CREATININE 0.91  --  0.89 0.94    Estimated Creatinine Clearance: 56.9 ml/min (by C-G formula based on Cr of 0.94).   Medications:  Prescriptions prior to admission  Medication Sig Dispense Refill  . albuterol (PROVENTIL HFA;VENTOLIN HFA) 108 (90 BASE) MCG/ACT inhaler Inhale 2 puffs into the lungs every 6 (six) hours as needed for wheezing or shortness of breath.      . budesonide (PULMICORT) 0.5 MG/2ML nebulizer solution Take 0.5 mg by nebulization 2 (two) times daily.      [name] docusate sodium (COLACE) 100 MG capsule Take 100 mg by mouth 2 (two) times daily as needed for constipation.      . fluticasone (FLONASE) 50 MCG/ACT nasal spray Place 2 sprays into the nose daily.      . formoterol (PERFOROMIST) 20 MCG/2ML nebulizer solution Take 20 mcg by nebulization 2 (two) times daily.      . furosemide (LASIX) 40 MG tablet Take 60 mg by mouth daily.      [name] loratadine (CLARITIN) 10 MG tablet Take 10 mg by mouth daily. For allergies      . megestrol (MEGACE) 40 MG/ML suspension Take by mouth as needed. Take 2 teaspoonfuls (10ml) by mouth daily      . metoprolol succinate (TOPROL-XL) 50 MG 24 hr tablet Take 1 tablet (50 mg total) by mouth daily.  30 tablet  6  . nitroGLYCERIN (NITROSTAT) 0.4 MG SL tablet Place 1 tablet (0.4 mg total) under the tongue every 5 (five) minutes as needed for chest pain.  25  each  3  . omeprazole (PRILOSEC) 20 MG capsule Take 20 mg by mouth daily.      [name] oxyCODONE-acetaminophen (PERCOCET) 5-325 MG per tablet Take 1-2 tablets by mouth every 6 (six) hours as needed for pain.      . predniSONE (DELTASONE) 5 MG tablet Take 5 mg by mouth daily.       . simvastatin (ZOCOR) 40 MG tablet Take 40 mg by mouth at bedtime.        . sotalol (BETAPACE) 120 MG tablet Take 1 tablet (120 mg total) by mouth 2 (two) times daily.  60 tablet  6  . spironolactone (ALDACTONE) 25 MG tablet Take 25 mg by mouth daily.      [name] warfarin (COUMADIN) 5 MG tablet Take 2.5-5 mg by mouth daily. Took 5 mg (1 tablet) on Mondays, Wednesdays, and Fridays On other days, took 2.5 mg (1/2 tablet)      . [DISCONTINUED] warfarin (COUMADIN) 5 MG tablet Take as directed by coumadin clinic  30 tablet  1   Scheduled:  . arformoterol  15 mcg Nebulization Q12H  . azithromycin  500 mg Intravenous Q24H  . budesonide  0.5 mg Nebulization BID  .  cefTRIAXone (ROCEPHIN)  IV  1 g Intravenous Q24H  . digoxin  0.125 mg Oral Daily  . fluticasone  2 spray Each Nare Daily  . furosemide  60 mg Oral Daily  . levalbuterol  0.63 mg Nebulization TID   And  . ipratropium  0.5 mg Nebulization TID  . loratadine  10 mg Oral Daily  . losartan  25 mg Oral Daily  . megestrol  400 mg Oral BID  . metoprolol succinate  25 mg Oral BID  . pantoprazole  40 mg Oral Q breakfast  . polyethylene glycol  17 g Oral Daily  . predniSONE  40 mg Oral Q breakfast  . simvastatin  40 mg Oral QHS  . sotalol  120 mg Oral BID  . spironolactone  25 mg Oral Daily  . Warfarin - Pharmacist Dosing Inpatient   Does not apply q1800    Assessment: 79 year old Page on chronic anticoagulation with Coumadin for atrial fibrillation.  His Coumadin was held 6/17-6/19 for a supratherapeutic INR, and again 6/21-6/23 for hemoptysis.  His INR remains subtherapeutic as would be expected.  Note he is on prednisone and antibiotic therapy for pneumonia which may  decrease his Coumadin requirements.  Goal of Therapy:  INR 2-3   Plan:  Coumadin 2.5mg  today Daily PT/INR If he is discharged today recommend resuming his home  Coumadin regimen with a PT/INR check on Friday 6/27.  Estella Husk, Pharm.D., BCPS Clinical Pharmacist Phone: [phone] or [phone] Pager: [phone] 01/28/2013, 9:54 AM

## 2013-01-28 NOTE — Progress Notes (Signed)
   SUBJECTIVE:  Feeling OK and anxious to go home   PHYSICAL EXAM Filed Vitals:   01/27/13 2019 01/27/13 2055 01/27/13 2200 01/28/13 0610  BP: 104/62  121/47 126/74  Pulse: 81  80 81  Temp: 98.1 F (36.7 C)   98.4 F (36.9 C)  TempSrc: Oral   Oral  Resp: 18   18  Height:      Weight:    144 lb (65.318 kg)  SpO2: 94% 96%  97%   General:  No distress Lungs:  Clear Heart:  RRR Abdomen:  Positive bowel sounds, no rebound no guarding Extremities:  No edema  LABS:  Results for orders placed during the hospital encounter of 01/24/13 (from the past 24 hour(s))  BASIC METABOLIC PANEL     Status: Abnormal   Collection Time    01/28/13  3:53 AM      Result Value Range   Sodium 133 (*) 135 - 145 mEq/L   Potassium 3.4 (*) 3.5 - 5.1 mEq/L   Chloride 97  96 - 112 mEq/L   CO2 27  19 - 32 mEq/L   Glucose, Bld 116 (*) 70 - 99 mg/dL   BUN 21  6 - 23 mg/dL   Creatinine, Ser 1.61  0.50 - 1.35 mg/dL   Calcium 8.8  8.4 - 09.6 mg/dL   GFR calc non Af Amer 76 (*) >90 mL/min   GFR calc Af Amer 88 (*) >90 mL/min  PROTIME-INR     Status: Abnormal   Collection Time    01/28/13  3:53 AM      Result Value Range   Prothrombin Time 16.3 (*) 11.6 - 15.2 seconds   INR 1.34  0.00 - 1.49    Intake/Output Summary (Last 24 hours) at 01/28/13 0751 Last data filed at 01/28/13 0300  Gross per 24 hour  Intake   1260 ml  Output    875 ml  Net    385 ml    ASSESSMENT AND PLAN:  Cardiomyopathy:  The patient was taken off of ARB during the last admission.  I reviewed the notes and I cannot identify a renal reason that this was stopped.  I suspect it was related to hypotension.  I will start a low dose of Cozaar.    Atrial fib:  Back in sinus.  ICD interrogated.  Continue current therapy.     Rollene Rotunda 01/28/2013 7:51 AM

## 2013-01-28 NOTE — Progress Notes (Signed)
MEDICARE-CERTIFIED HOME HEALTH AGENCIES [place]   Agencies that are Medicare-Certified and affiliated with [facility]  Home Health Agency  Telephone Number Address  [facility]  [facility] has ownership interest in this company; however, you are under no obligation to use this agency. [phone]  [address] [place], [place] [address]    Agencies that are Medicare-Certified and are not affiliated with [facility]   Home Health Agency Telephone Number Address  [facility] [phone] Fax [phone] [address] [place], [place]  [address]  [facility] [phone] [address] [place], [place] [address]  [facility]  [phone] Fax [phone] [address]  [place], [place]  [address]  [facility] [phone] or [phone] [address] [place], [place] [address]  [facility] [phone] or [phone] [address], [address] [place], [place]  [address]      Agencies that are not Medicare-Certified and are not affiliated with [facility]    Home Health Agency Telephone Number Address  [facility] [phone] Fax [phone] [address] [place], [place]  [address]  [facility] [phone] or [phone] Fax [phone] [address] [place], [place]  [address]  [facility]  [phone] [address] [place], [place]  [facility] [phone] Fax [phone] [address] [place], [place]  [address]  [facility] [phone] Fax [phone] [address] [place], [place]  [address]  [facility] [phone] [address] [place], [place]  [address]  [facility] [phone] Fax [phone] [address] [place], [place] [address]  [facility] [phone] [address] [place], [place]  [address]  [facility] [phone] Fax [phone] [address] , [place]  [address]    

## 2013-01-30 ENCOUNTER — Ambulatory Visit (INDEPENDENT_AMBULATORY_CARE_PROVIDER_SITE_OTHER): Payer: Medicare Other | Admitting: *Deleted

## 2013-01-30 DIAGNOSIS — I4891 Unspecified atrial fibrillation: Secondary | ICD-10-CM

## 2013-01-30 DIAGNOSIS — Z7901 Long term (current) use of anticoagulants: Secondary | ICD-10-CM

## 2013-01-30 LAB — CULTURE, BLOOD (ROUTINE X 2)

## 2013-02-03 ENCOUNTER — Ambulatory Visit (INDEPENDENT_AMBULATORY_CARE_PROVIDER_SITE_OTHER): Payer: Medicare Other | Admitting: *Deleted

## 2013-02-03 ENCOUNTER — Telehealth: Payer: Self-pay | Admitting: *Deleted

## 2013-02-03 DIAGNOSIS — I4891 Unspecified atrial fibrillation: Secondary | ICD-10-CM

## 2013-02-03 DIAGNOSIS — Z7901 Long term (current) use of anticoagulants: Secondary | ICD-10-CM

## 2013-02-03 LAB — POCT INR: INR: 2.3

## 2013-02-03 NOTE — Telephone Encounter (Signed)
See coumadin note. 

## 2013-02-03 NOTE — Telephone Encounter (Signed)
PT  27.8 INR  2.3  5 mg  M-W-F 2.5mg  all other days

## 2013-02-10 ENCOUNTER — Telehealth: Payer: Self-pay | Admitting: *Deleted

## 2013-02-10 ENCOUNTER — Ambulatory Visit (INDEPENDENT_AMBULATORY_CARE_PROVIDER_SITE_OTHER): Payer: Medicare Other | Admitting: *Deleted

## 2013-02-10 DIAGNOSIS — I4891 Unspecified atrial fibrillation: Secondary | ICD-10-CM

## 2013-02-10 DIAGNOSIS — Z7901 Long term (current) use of anticoagulants: Secondary | ICD-10-CM

## 2013-02-10 NOTE — Telephone Encounter (Signed)
PT 40.2 INR 3.4

## 2013-02-10 NOTE — Telephone Encounter (Signed)
See coumadin note. 

## 2013-02-17 ENCOUNTER — Ambulatory Visit (INDEPENDENT_AMBULATORY_CARE_PROVIDER_SITE_OTHER): Payer: Medicare Other | Admitting: *Deleted

## 2013-02-17 DIAGNOSIS — I4891 Unspecified atrial fibrillation: Secondary | ICD-10-CM

## 2013-02-17 DIAGNOSIS — Z7901 Long term (current) use of anticoagulants: Secondary | ICD-10-CM

## 2013-02-23 ENCOUNTER — Telehealth: Payer: Self-pay | Admitting: Physician Assistant

## 2013-02-23 NOTE — Telephone Encounter (Signed)
Called and spoke with patient's wife. Wife said he starts shaking and body freezes from waist down and see's little blue lights while walking with walker. No c/o sob, or chest pain during the spells. Patient had 2 spells this morning and this has been going on for about 2 weeks. Patient started a new medications prescribed by Dr. Margo Common about 2 weeks ago. Wife is unsure of the name of the medication. Nurse advised wife to call PCP about these new problems that have started recently after starting new medication.

## 2013-02-23 NOTE — Telephone Encounter (Signed)
When patient walks he sometime has spells where he shakes from waste up and cant move lower half of body when this happens. She has to let him fall/lower to floor.  She keeps him there until he is able to help her move him

## 2013-02-24 ENCOUNTER — Ambulatory Visit (INDEPENDENT_AMBULATORY_CARE_PROVIDER_SITE_OTHER): Payer: Medicare Other | Admitting: *Deleted

## 2013-02-24 DIAGNOSIS — Z7901 Long term (current) use of anticoagulants: Secondary | ICD-10-CM

## 2013-02-24 DIAGNOSIS — I4891 Unspecified atrial fibrillation: Secondary | ICD-10-CM

## 2013-03-03 ENCOUNTER — Telehealth: Payer: Self-pay | Admitting: *Deleted

## 2013-03-03 ENCOUNTER — Ambulatory Visit (INDEPENDENT_AMBULATORY_CARE_PROVIDER_SITE_OTHER): Payer: Medicare Other | Admitting: *Deleted

## 2013-03-03 DIAGNOSIS — I4891 Unspecified atrial fibrillation: Secondary | ICD-10-CM

## 2013-03-03 DIAGNOSIS — Z7901 Long term (current) use of anticoagulants: Secondary | ICD-10-CM

## 2013-03-03 LAB — POCT INR: INR: 2.9

## 2013-03-03 NOTE — Telephone Encounter (Signed)
PT  INR 2.9

## 2013-03-03 NOTE — Telephone Encounter (Signed)
See coumadin note. 

## 2013-03-12 ENCOUNTER — Ambulatory Visit (INDEPENDENT_AMBULATORY_CARE_PROVIDER_SITE_OTHER): Payer: Medicare Other | Admitting: *Deleted

## 2013-03-12 DIAGNOSIS — I4891 Unspecified atrial fibrillation: Secondary | ICD-10-CM

## 2013-03-12 DIAGNOSIS — Z7901 Long term (current) use of anticoagulants: Secondary | ICD-10-CM

## 2013-03-12 LAB — POCT INR: INR: 2.6

## 2013-03-30 ENCOUNTER — Encounter: Payer: Self-pay | Admitting: Internal Medicine

## 2013-03-30 ENCOUNTER — Ambulatory Visit (INDEPENDENT_AMBULATORY_CARE_PROVIDER_SITE_OTHER): Payer: Medicare Other | Admitting: Internal Medicine

## 2013-03-30 VITALS — BP 110/62 | HR 79 | Ht 69.0 in | Wt 148.0 lb

## 2013-03-30 DIAGNOSIS — Z95810 Presence of automatic (implantable) cardiac defibrillator: Secondary | ICD-10-CM

## 2013-03-30 DIAGNOSIS — I10 Essential (primary) hypertension: Secondary | ICD-10-CM

## 2013-03-30 DIAGNOSIS — I4891 Unspecified atrial fibrillation: Secondary | ICD-10-CM

## 2013-03-30 DIAGNOSIS — I5042 Chronic combined systolic (congestive) and diastolic (congestive) heart failure: Secondary | ICD-10-CM

## 2013-03-30 DIAGNOSIS — I472 Ventricular tachycardia: Secondary | ICD-10-CM

## 2013-03-30 DIAGNOSIS — I509 Heart failure, unspecified: Secondary | ICD-10-CM

## 2013-03-30 LAB — ICD DEVICE OBSERVATION
AL AMPLITUDE: 3.4 mv
AL THRESHOLD: 1 V
BATTERY VOLTAGE: 2.5718 V
DEVICE MODEL ICD: 454179
MODE SWITCH EPISODES: 0
PACEART VT: 83
RV LEAD THRESHOLD: 1.25 V
TOT-0010: 55
TZAT-0001FASTVT: 1
TZAT-0004FASTVT: 8
TZAT-0018SLOWVT: NEGATIVE
TZAT-0019FASTVT: 7.5 V
TZAT-0019SLOWVT: 7.5 V
TZAT-0020SLOWVT: 1 ms
TZON-0003SLOWVT: 425.5 ms
TZON-0005SLOWVT: 6
TZON-0010FASTVT: 80 ms
TZON-0010SLOWVT: 80 ms
TZST-0001FASTVT: 3
TZST-0001FASTVT: 4
TZST-0003FASTVT: 25 J
TZST-0003FASTVT: 36 J
TZST-0003FASTVT: 36 J
VF: 85

## 2013-03-30 MED ORDER — METOPROLOL SUCCINATE ER 25 MG PO TB24: 25.0000 mg | ORAL_TABLET | Freq: Every day | ORAL | Status: DC

## 2013-03-30 MED ORDER — FUROSEMIDE 40 MG PO TABS: 40.0000 mg | ORAL_TABLET | Freq: Every day | ORAL | Status: DC

## 2013-03-30 NOTE — Patient Instructions (Addendum)
   Decrease Lasix to 40mg  daily  Decrease Toprol XL to 25mg  daily - new sent to pharm Continue all other medications.   2 gm sodium diet - see info sheet provided  Follow up in  3 months - see above for appointment

## 2013-03-30 NOTE — Progress Notes (Signed)
PCP:  Louie Boston, MD Primary Cardiologist:  Dr Bobbye Morton  The patient presents today for routine electrophysiology followup.  Since his recent VT ablation, the patient reports doing very well.   He reports fatigue frequently with decreased exercise tolerance.  He continues to have occasional postural dizziness.  He did have one episode of syncoep 7/14 which corresponds to an ICD shock which he received for VT.  This is his only episode of VT since his past visit.  Today, he denies symptoms of palpitations, chest pain, shortness of breath, orthopnea, PND, lower extremity edema, or neurologic sequela.  The patient feels that he is tolerating medications without difficulties and is otherwise without complaint today.   Past Medical History  Diagnosis Date  . Hypertension   . Coronary artery disease     a. CABG 03/2006. b. NSTEMI 03/2012 following VT likely demand ischemia - grafts patent at cath.  . Diabetes mellitus   . Other specified forms of chronic ischemic heart disease   . Syncope and collapse   . Automatic implantable cardiac defibrillator in situ     QRS duration 120 ms no typical left bundle branch block.  . Atrial fibrillation     Taking sotalol, intolerant to amiodarone  . Valvular heart disease     Mild to moderate mitral valve insufficiency and mild aortic insufficiency  . Chronic airway obstruction, not elsewhere classified   . Old myocardial infarction   . Other and unspecified hyperlipidemia   . Congestive heart failure, unspecified     a. Ischemic cardiomyopathy ejection fraction 30-35% previously. b. Down to 15% by cath 03/2012.  [name] Cerebrovascular disease, unspecified   . Other mechanical complication of other internal orthopedic device, implant, and graft   . Carotid artery disease     s/p LCEA 2007.  [name] Head and neck cancer     Receiving radiation therapy.  . Orthostatic hypotension   . Ventricular tachycardia     a. S/p ICD implantation. b. Recurrent in  03/2012 with medication adjustment.;  c. s/p RFCA 11/2012 (Dr. Fawn Kirk)    Past Surgical History  Procedure Laterality Date  . Repeat otif left knee with figure-of-eight tension band.    . Implantation of a dual-chamber implantable  06/18/2007  . Left carotid endarterectomy    . Coronary artery bypass graft  04/04/2006  . Vt ablation  11/06/12    Ischemic VT ablation by Dr Johney Frame    Current Outpatient Prescriptions  Medication Sig Dispense Refill  . albuterol (PROVENTIL HFA;VENTOLIN HFA) 108 (90 BASE) MCG/ACT inhaler Inhale 2 puffs into the lungs every 6 (six) hours as needed for wheezing or shortness of breath.      . budesonide (PULMICORT) 0.5 MG/2ML nebulizer solution Take 0.5 mg by nebulization 2 (two) times daily.      . digoxin (LANOXIN) 0.125 MG tablet Take 1 tablet (0.125 mg total) by mouth daily.  30 tablet  2  . docusate sodium (COLACE) 100 MG capsule Take 100 mg by mouth 2 (two) times daily as needed for constipation.      . fluticasone (FLONASE) 50 MCG/ACT nasal spray Place 2 sprays into the nose daily.      . formoterol (PERFOROMIST) 20 MCG/2ML nebulizer solution Take 20 mcg by nebulization 2 (two) times daily.      . furosemide (LASIX) 40 MG tablet Take 60 mg by mouth daily.      [name] loratadine (CLARITIN) 10 MG tablet Take 10 mg by mouth daily. For allergies      .  losartan (COZAAR) 25 MG tablet Take 0.5 tablets (12.5 mg total) by mouth daily.  30 tablet  0  . megestrol (MEGACE) 40 MG/ML suspension Take by mouth as needed. Take 2 teaspoonfuls (10ml) by mouth daily      . metoprolol succinate (TOPROL-XL) 50 MG 24 hr tablet Take 1 tablet (50 mg total) by mouth daily.  30 tablet  6  . nitroGLYCERIN (NITROSTAT) 0.4 MG SL tablet Place 1 tablet (0.4 mg total) under the tongue every 5 (five) minutes as needed for chest pain.  25 each  3  . omeprazole (PRILOSEC) 20 MG capsule Take 20 mg by mouth daily.      . potassium chloride (K-DUR) 10 MEQ tablet Take 10 mEq by mouth daily.       .  predniSONE (DELTASONE) 5 MG tablet Take 5 mg by mouth daily.       . simvastatin (ZOCOR) 40 MG tablet Take 40 mg by mouth at bedtime.        . sotalol (BETAPACE) 120 MG tablet Take 1 tablet (120 mg total) by mouth 2 (two) times daily.  60 tablet  6  . spironolactone (ALDACTONE) 25 MG tablet Take 25 mg by mouth daily.      [name] warfarin (COUMADIN) 5 MG tablet Take 0.5-1 tablets (2.5-5 mg total) by mouth daily. Took 5 mg (1 tablet) on Mondays, Wednesdays, and Fridays On other days, took 2.5 mg (1/2 tablet)  60 tablet  0   No current facility-administered medications for this visit.    No Known Allergies  History   Social History  . Marital Status: Married    Spouse Name: N/A    Number of Children: N/A  . Years of Education: N/A   Occupational History  . RETIRED    Social History Main Topics  . Smoking status: Former Smoker -- 2.00 packs/day for 60 years    Types: Cigarettes    Quit date: 08/06/1990  . Smokeless tobacco: Never Used  . Alcohol Use: No  . Drug Use: No  . Sexual Activity: Yes   Other Topics Concern  . Not on file   Social History Narrative   Pt does not get regular exercise.    Physical Exam: Filed Vitals:   03/30/13 1338  BP: 110/62  Pulse: 79  Height: 5\' 9"  (1.753 m)  Weight: 148 lb (67.132 kg)    GEN- The patient is well appearing, alert and oriented x 3 today.   Head- normocephalic, atraumatic Eyes-  Sclera clear, conjunctiva pink Ears- hearing intact Oropharynx- clear Neck- supple, no JVP Lungs- Clear to ausculation bilaterally, normal work of breathing Heart- Regular rate and rhythm, no murmurs, rubs or gallops, PMI not laterally displaced GI- soft, NT, ND, + BS Extremities- no clubbing, cyanosis, or edema  ICD interrogation today is reviewed  Assessment and Plan:  1. VT Doing well since his VT ablation A single VT episode 03/05/13 (CL 280 msec) terminated with 25J x 1. Today I have increased the VT detection zone from 110 to 140 bpm. No  driving Continue sotalol to 120mg  BID Decrease toprol to 25mg  daily  2. afib Stable Continue coumadin  3. Fatigue Decrease toprol to 25mg  daily Rate response is turned on to promote better heart rates with activity (91% A paced) No other changes  4. Chronic systolic dysfunction He is euvolemic on exam today Decrease lasix to 40mg  daily due to postural dizziness Daily weights and 2 gram sodium restriction is advised  Follow-up with Dr  Koneswaran as scheduled later this week I will see in 3 months for further evaluation of VT.

## 2013-04-03 ENCOUNTER — Encounter: Payer: Self-pay | Admitting: Cardiovascular Disease

## 2013-04-03 ENCOUNTER — Ambulatory Visit (INDEPENDENT_AMBULATORY_CARE_PROVIDER_SITE_OTHER): Payer: Medicare Other | Admitting: *Deleted

## 2013-04-03 ENCOUNTER — Ambulatory Visit (INDEPENDENT_AMBULATORY_CARE_PROVIDER_SITE_OTHER): Payer: Medicare Other | Admitting: Cardiovascular Disease

## 2013-04-03 VITALS — BP 142/75 | HR 81 | Ht 69.0 in | Wt 144.0 lb

## 2013-04-03 DIAGNOSIS — I2510 Atherosclerotic heart disease of native coronary artery without angina pectoris: Secondary | ICD-10-CM

## 2013-04-03 DIAGNOSIS — R531 Weakness: Secondary | ICD-10-CM

## 2013-04-03 DIAGNOSIS — Z95810 Presence of automatic (implantable) cardiac defibrillator: Secondary | ICD-10-CM

## 2013-04-03 DIAGNOSIS — R5381 Other malaise: Secondary | ICD-10-CM

## 2013-04-03 DIAGNOSIS — R5383 Other fatigue: Secondary | ICD-10-CM

## 2013-04-03 DIAGNOSIS — I519 Heart disease, unspecified: Secondary | ICD-10-CM

## 2013-04-03 DIAGNOSIS — I4891 Unspecified atrial fibrillation: Secondary | ICD-10-CM

## 2013-04-03 DIAGNOSIS — Z7901 Long term (current) use of anticoagulants: Secondary | ICD-10-CM

## 2013-04-03 DIAGNOSIS — I5042 Chronic combined systolic (congestive) and diastolic (congestive) heart failure: Secondary | ICD-10-CM

## 2013-04-03 DIAGNOSIS — I38 Endocarditis, valve unspecified: Secondary | ICD-10-CM

## 2013-04-03 DIAGNOSIS — I10 Essential (primary) hypertension: Secondary | ICD-10-CM

## 2013-04-03 DIAGNOSIS — I472 Ventricular tachycardia: Secondary | ICD-10-CM

## 2013-04-03 DIAGNOSIS — I509 Heart failure, unspecified: Secondary | ICD-10-CM

## 2013-04-03 LAB — POCT INR: INR: 2.2

## 2013-04-03 NOTE — Progress Notes (Signed)
Patient ID: BUELL PARCEL, male   DOB: [birth year], 79 y.o.   MRN: [id number]   SUBJECTIVE: Mr. Kneale had a VT ablation and is followed by Dr. Johney Frame for this. He has multiple medical problems which include an ischemic cardiomyopathy, chronic systolic heart failure, atrial fibrillation, valvular heart disease (moderate MR), HTN, COPD, diabetes, and PVD (s/p CEA). His most recent echo on 01/01/13 showed an EF of 30-35% with multiple wall motion abnormalities.  He recently had his Toprol XL reduced to 25 mg daily and his Lasix reduced to 40 mg daily by Dr. Johney Frame. He had been feeling well up until today, but has felt generalized weakness today. He denies any worsening SOB from his baseline, and has lost 4 lbs this week alone. He's apparently been losing weight for some time and Dr. Margo Common, his PCP, has been investigating this.  He denies syncope and chest pain.   He weighs himself daily as part of his health plan. When he does accumulate fluid, he says it's primarily located in his abdomen.  No Known Allergies  Current Outpatient Prescriptions  Medication Sig Dispense Refill  . albuterol (PROVENTIL HFA;VENTOLIN HFA) 108 (90 BASE) MCG/ACT inhaler Inhale 2 puffs into the lungs every 6 (six) hours as needed for wheezing or shortness of breath.      . budesonide (PULMICORT) 0.5 MG/2ML nebulizer solution Take 0.5 mg by nebulization 2 (two) times daily.      . digoxin (LANOXIN) 0.125 MG tablet Take 1 tablet (0.125 mg total) by mouth daily.  30 tablet  2  . docusate sodium (COLACE) 100 MG capsule Take 100 mg by mouth 2 (two) times daily as needed for constipation.      . fluticasone (FLONASE) 50 MCG/ACT nasal spray Place 2 sprays into the nose daily.      . formoterol (PERFOROMIST) 20 MCG/2ML nebulizer solution Take 20 mcg by nebulization 2 (two) times daily.      . furosemide (LASIX) 40 MG tablet Take 1 tablet (40 mg total) by mouth daily.      [name] loratadine (CLARITIN) 10 MG tablet Take 10 mg by mouth  daily. For allergies      . losartan (COZAAR) 25 MG tablet Take 0.5 tablets (12.5 mg total) by mouth daily.  30 tablet  0  . megestrol (MEGACE) 40 MG/ML suspension Take by mouth as needed. Take 2 teaspoonfuls (10ml) by mouth daily      . metoprolol succinate (TOPROL-XL) 25 MG 24 hr tablet Take 1 tablet (25 mg total) by mouth daily.  30 tablet  6  . nitroGLYCERIN (NITROSTAT) 0.4 MG SL tablet Place 1 tablet (0.4 mg total) under the tongue every 5 (five) minutes as needed for chest pain.  25 each  3  . omeprazole (PRILOSEC) 20 MG capsule Take 20 mg by mouth daily.      . potassium chloride (K-DUR) 10 MEQ tablet Take 10 mEq by mouth daily.       . predniSONE (DELTASONE) 5 MG tablet Take 5 mg by mouth daily.       . simvastatin (ZOCOR) 40 MG tablet Take 40 mg by mouth at bedtime.        . sotalol (BETAPACE) 120 MG tablet Take 1 tablet (120 mg total) by mouth 2 (two) times daily.  60 tablet  6  . spironolactone (ALDACTONE) 25 MG tablet Take 25 mg by mouth daily.      [name] warfarin (COUMADIN) 5 MG tablet Take 0.5-1 tablets (2.5-5 mg total) by  mouth daily. Took 5 mg (1 tablet) on Mondays, Wednesdays, and Fridays On other days, took 2.5 mg (1/2 tablet)  60 tablet  0   No current facility-administered medications for this visit.    Past Medical History  Diagnosis Date  . Hypertension   . Coronary artery disease     a. CABG 03/2006. b. NSTEMI 03/2012 following VT likely demand ischemia - grafts patent at cath.  . Diabetes mellitus   . Other specified forms of chronic ischemic heart disease   . Syncope and collapse   . Automatic implantable cardiac defibrillator in situ     QRS duration 120 ms no typical left bundle branch block.  . Atrial fibrillation     Taking sotalol, intolerant to amiodarone  . Valvular heart disease     Mild to moderate mitral valve insufficiency and mild aortic insufficiency  . Chronic airway obstruction, not elsewhere classified   . Old myocardial infarction   . Other and  unspecified hyperlipidemia   . Congestive heart failure, unspecified     a. Ischemic cardiomyopathy ejection fraction 30-35% previously. b. Down to 15% by cath 03/2012.  [name] Cerebrovascular disease, unspecified   . Other mechanical complication of other internal orthopedic device, implant, and graft   . Carotid artery disease     s/p LCEA 2007.  [name] Head and neck cancer     Receiving radiation therapy.  . Orthostatic hypotension   . Ventricular tachycardia     a. S/p ICD implantation. b. Recurrent in 03/2012 with medication adjustment.;  c. s/p RFCA 11/2012 (Dr. Fawn Kirk)     Past Surgical History  Procedure Laterality Date  . Repeat otif left knee with figure-of-eight tension band.    . Implantation of a dual-chamber implantable  06/18/2007  . Left carotid endarterectomy    . Coronary artery bypass graft  04/04/2006  . Vt ablation  11/06/12    Ischemic VT ablation by Dr Johney Frame    History   Social History  . Marital Status: Married    Spouse Name: N/A    Number of Children: N/A  . Years of Education: N/A   Occupational History  . RETIRED    Social History Main Topics  . Smoking status: Former Smoker -- 2.00 packs/day for 60 years    Types: Cigarettes    Quit date: 08/06/1990  . Smokeless tobacco: Never Used  . Alcohol Use: No  . Drug Use: No  . Sexual Activity: Yes   Other Topics Concern  . Not on file   Social History Narrative   Pt does not get regular exercise.    @FAMX @  Filed Vitals:   04/03/13 1428  BP: 142/75  Pulse: 81  Height: 5\' 9"  (1.753 m)  Weight: 144 lb (65.318 kg)    PHYSICAL EXAM General: NAD Neck: No JVD, no thyromegaly or thyroid nodule.  Lungs: Clear to auscultation bilaterally with normal respiratory effort. CV: Nondisplaced PMI.  Heart regular S1/S2, no S3/S4, no murmur.  No peripheral edema.  No carotid bruit.  Normal pedal pulses.  Abdomen: Soft, nontender, no hepatosplenomegaly, no distention.  Neurologic: Alert and oriented x 3.  Psych:  Normal affect. Extremities: No clubbing or cyanosis.   ECG: reviewed and available in electronic records.      ASSESSMENT AND PLAN: 1. Ischemic cardiomyopathy s/p ICD: stable on current medical therapy. Toprol XL recently decreased to 25 mg daily. 2. Chronic systolic heart failure: euvolemic. No changes in diuretic regimen, as Lasix was recently decreased to  40 mg daily. Will check a BMP and CBC. 3. Atrial fibrillation: stable on Sotaolol and Warfarin. 4. Moderate mitral regurgitation: no indication for echocardiogram at this time. Stable. 5. VT s/p ablation: followed by Dr. Johney Frame. 6. Fatigue: will check a BMP and CBC. Toprol XL recently decreased, and HR currently 81 bpm. Also on Sotalol.   Prentice Docker, M.D., F.A.C.C.

## 2013-04-03 NOTE — Patient Instructions (Signed)
   Labs for BMET, CBC Office will contact with results via phone or letter.   Continue all current medications. Follow up in 3 months

## 2013-04-09 ENCOUNTER — Encounter: Payer: Self-pay | Admitting: *Deleted

## 2013-05-01 ENCOUNTER — Ambulatory Visit (INDEPENDENT_AMBULATORY_CARE_PROVIDER_SITE_OTHER): Payer: Medicare Other | Admitting: *Deleted

## 2013-05-01 DIAGNOSIS — Z7901 Long term (current) use of anticoagulants: Secondary | ICD-10-CM

## 2013-05-01 DIAGNOSIS — I4891 Unspecified atrial fibrillation: Secondary | ICD-10-CM

## 2013-05-01 LAB — POCT INR: INR: 2.3

## 2013-06-05 ENCOUNTER — Ambulatory Visit (INDEPENDENT_AMBULATORY_CARE_PROVIDER_SITE_OTHER): Payer: Medicare Other | Admitting: *Deleted

## 2013-06-05 DIAGNOSIS — Z7901 Long term (current) use of anticoagulants: Secondary | ICD-10-CM

## 2013-06-05 DIAGNOSIS — I4891 Unspecified atrial fibrillation: Secondary | ICD-10-CM

## 2013-06-12 ENCOUNTER — Inpatient Hospital Stay (HOSPITAL_COMMUNITY)
Admission: EM | Admit: 2013-06-12 | Discharge: 2013-06-15 | DRG: 308 | Disposition: A | Discharge status: Home / Self Care | Payer: Medicare Other | Attending: Cardiology | Admitting: Cardiology

## 2013-06-12 ENCOUNTER — Other Ambulatory Visit: Payer: Self-pay

## 2013-06-12 ENCOUNTER — Encounter (HOSPITAL_COMMUNITY): Payer: Self-pay | Admitting: Emergency Medicine

## 2013-06-12 ENCOUNTER — Emergency Department (HOSPITAL_COMMUNITY): Payer: Medicare Other

## 2013-06-12 DIAGNOSIS — I472 Ventricular tachycardia, unspecified: Principal | ICD-10-CM | POA: Diagnosis present

## 2013-06-12 DIAGNOSIS — I2589 Other forms of chronic ischemic heart disease: Secondary | ICD-10-CM | POA: Diagnosis present

## 2013-06-12 DIAGNOSIS — I2510 Atherosclerotic heart disease of native coronary artery without angina pectoris: Secondary | ICD-10-CM | POA: Diagnosis present

## 2013-06-12 DIAGNOSIS — I509 Heart failure, unspecified: Secondary | ICD-10-CM | POA: Diagnosis present

## 2013-06-12 DIAGNOSIS — I10 Essential (primary) hypertension: Secondary | ICD-10-CM | POA: Diagnosis present

## 2013-06-12 DIAGNOSIS — Z95810 Presence of automatic (implantable) cardiac defibrillator: Secondary | ICD-10-CM

## 2013-06-12 DIAGNOSIS — E43 Unspecified severe protein-calorie malnutrition: Secondary | ICD-10-CM | POA: Diagnosis present

## 2013-06-12 DIAGNOSIS — I080 Rheumatic disorders of both mitral and aortic valves: Secondary | ICD-10-CM | POA: Diagnosis present

## 2013-06-12 DIAGNOSIS — IMO0002 Reserved for concepts with insufficient information to code with codable children: Secondary | ICD-10-CM

## 2013-06-12 DIAGNOSIS — Z87891 Personal history of nicotine dependence: Secondary | ICD-10-CM

## 2013-06-12 DIAGNOSIS — I4891 Unspecified atrial fibrillation: Secondary | ICD-10-CM | POA: Diagnosis present

## 2013-06-12 DIAGNOSIS — D649 Anemia, unspecified: Secondary | ICD-10-CM | POA: Diagnosis present

## 2013-06-12 DIAGNOSIS — I4729 Other ventricular tachycardia: Principal | ICD-10-CM | POA: Diagnosis present

## 2013-06-12 DIAGNOSIS — J4489 Other specified chronic obstructive pulmonary disease: Secondary | ICD-10-CM | POA: Diagnosis present

## 2013-06-12 DIAGNOSIS — E878 Other disorders of electrolyte and fluid balance, not elsewhere classified: Secondary | ICD-10-CM | POA: Diagnosis present

## 2013-06-12 DIAGNOSIS — Z951 Presence of aortocoronary bypass graft: Secondary | ICD-10-CM

## 2013-06-12 DIAGNOSIS — Z79899 Other long term (current) drug therapy: Secondary | ICD-10-CM

## 2013-06-12 DIAGNOSIS — I252 Old myocardial infarction: Secondary | ICD-10-CM

## 2013-06-12 DIAGNOSIS — E785 Hyperlipidemia, unspecified: Secondary | ICD-10-CM | POA: Diagnosis present

## 2013-06-12 DIAGNOSIS — I679 Cerebrovascular disease, unspecified: Secondary | ICD-10-CM | POA: Diagnosis present

## 2013-06-12 DIAGNOSIS — I519 Heart disease, unspecified: Secondary | ICD-10-CM

## 2013-06-12 DIAGNOSIS — Z7901 Long term (current) use of anticoagulants: Secondary | ICD-10-CM

## 2013-06-12 DIAGNOSIS — J449 Chronic obstructive pulmonary disease, unspecified: Secondary | ICD-10-CM | POA: Diagnosis present

## 2013-06-12 DIAGNOSIS — E119 Type 2 diabetes mellitus without complications: Secondary | ICD-10-CM | POA: Diagnosis present

## 2013-06-12 LAB — COMPREHENSIVE METABOLIC PANEL
ALT: 8 U/L (ref 0–53)
AST: 13 U/L (ref 0–37)
Albumin: 3.3 g/dL — ABNORMAL LOW (ref 3.5–5.2)
CO2: 24 mEq/L (ref 19–32)
Calcium: 9 mg/dL (ref 8.4–10.5)
Creatinine, Ser: 0.79 mg/dL (ref 0.50–1.35)
GFR calc non Af Amer: 82 mL/min — ABNORMAL LOW (ref >90)
Glucose, Bld: 122 mg/dL — ABNORMAL HIGH (ref 70–99)
Sodium: 136 mEq/L (ref 135–145)
Total Protein: 6.3 g/dL (ref 6.0–8.3)

## 2013-06-12 LAB — CBC WITH DIFFERENTIAL/PLATELET
Basophils Absolute: 0.1 10*3/uL (ref 0.0–0.1)
Basophils Relative: 1 % (ref 0–1)
Eosinophils Absolute: 0.1 10*3/uL (ref 0.0–0.7)
Eosinophils Relative: 2 % (ref 0–5)
MCH: 23.8 pg — ABNORMAL LOW (ref 26.0–34.0)
MCHC: 30.8 g/dL (ref 30.0–36.0)
MCV: 77.1 fL — ABNORMAL LOW (ref 78.0–100.0)
Neutrophils Relative %: 74 % (ref 43–77)
Platelets: 262 10*3/uL (ref 150–400)
RBC: 3.62 MIL/uL — ABNORMAL LOW (ref 4.22–5.81)
RDW: 16.8 % — ABNORMAL HIGH (ref 11.5–15.5)

## 2013-06-12 LAB — PROTIME-INR
INR: 1.9 — ABNORMAL HIGH (ref 0.00–1.49)
Prothrombin Time: 21.2 seconds — ABNORMAL HIGH (ref 11.6–15.2)

## 2013-06-12 MED ORDER — POTASSIUM CHLORIDE CRYS ER 20 MEQ PO TBCR
30.0000 meq | EXTENDED_RELEASE_TABLET | Freq: Once | ORAL | Status: AC
  Administered 2013-06-13: 30 meq via ORAL
  Filled 2013-06-12: qty 1.5

## 2013-06-12 MED ORDER — POTASSIUM CHLORIDE 10 MEQ/100ML IV SOLN
10.0000 meq | Freq: Once | INTRAVENOUS | Status: AC
  Administered 2013-06-13: 10 meq via INTRAVENOUS
  Filled 2013-06-12: qty 100

## 2013-06-12 MED ORDER — POTASSIUM CHLORIDE CRYS ER 20 MEQ PO TBCR: 10.0000 meq | EXTENDED_RELEASE_TABLET | Freq: Two times a day (BID) | ORAL | Status: DC

## 2013-06-12 MED ORDER — POTASSIUM CHLORIDE 10 MEQ/100ML IV SOLN: 10.0000 meq | INTRAVENOUS | Status: DC

## 2013-06-12 MED ORDER — MAGNESIUM SULFATE 40 MG/ML IJ SOLN
2.0000 g | Freq: Once | INTRAMUSCULAR | Status: AC
  Administered 2013-06-12: 2 g via INTRAVENOUS
  Filled 2013-06-12: qty 50

## 2013-06-12 NOTE — ED Provider Notes (Signed)
CSN: [id number]     Arrival date & time 06/12/13  2018 History   First MD Initiated Contact with Patient 06/12/13 2047     Chief Complaint  Patient presents with  . AICD Problem   (Consider location/radiation/quality/duration/timing/severity/associated sxs/prior Treatment) HPI Pt states defibrillator fired twice between 6 PM - 6:30 PM while he was sitting watching television. Onset was sudden.  The pain is currently none. Pt reports no pain prior to firing. Modifying factors: none.  Associated symptoms: no emesis, no SOB.  Recent medical care: none. Pt with [facility] defibrillator.    Past Medical History  Diagnosis Date  . Hypertension   . Coronary artery disease     a. CABG 03/2006. b. NSTEMI 03/2012 following VT likely demand ischemia - grafts patent at cath.  . Diabetes mellitus   . Other specified forms of chronic ischemic heart disease   . Syncope and collapse   . Automatic implantable cardiac defibrillator in situ     QRS duration 120 ms no typical left bundle branch block.  . Atrial fibrillation     Taking sotalol, intolerant to amiodarone  . Valvular heart disease     Mild to moderate mitral valve insufficiency and mild aortic insufficiency  . Chronic airway obstruction, not elsewhere classified   . Old myocardial infarction   . Other and unspecified hyperlipidemia   . Congestive heart failure, unspecified     a. Ischemic cardiomyopathy ejection fraction 30-35% previously. b. Down to 15% by cath 03/2012.  [name] Cerebrovascular disease, unspecified   . Other mechanical complication of other internal orthopedic device, implant, and graft   . Carotid artery disease     s/p LCEA 2007.  [name] Head and neck cancer     Receiving radiation therapy.  . Orthostatic hypotension   . Ventricular tachycardia     a. S/p ICD implantation. b. Recurrent in 03/2012 with medication adjustment.;  c. s/p RFCA 11/2012 (Dr. Fawn Kirk)    Past Surgical History  Procedure Laterality Date  . Repeat otif left knee  with figure-of-eight tension band.    . Implantation of a dual-chamber implantable  06/18/2007  . Left carotid endarterectomy    . Coronary artery bypass graft  04/04/2006  . Vt ablation  11/06/12    Ischemic VT ablation by Dr Johney Frame   No family history on file. History  Substance Use Topics  . Smoking status: Former Smoker -- 2.00 packs/day for 60 years    Types: Cigarettes    Quit date: 08/06/1990  . Smokeless tobacco: Never Used  . Alcohol Use: No    Review of Systems Constitutional: Negative for fever.  Eyes: Negative for vision loss.  ENT: Negative for difficulty swallowing.  Cardiovascular: Negative for chest pain. Respiratory: Negative for respiratory distress.  Gastrointestinal:  Negative for vomiting.  Genitourinary: Negative for inability to void.  Musculoskeletal: Negative for gait problem.  Integumentary: Negative for rash.  Neurological: Negative for new focal weakness.     Allergies  Review of patient's allergies indicates no known allergies.  Home Medications   Current Outpatient Rx  Name  Route  Sig  Dispense  Refill  . acetaminophen (TYLENOL) 500 MG tablet   Oral   Take 1,000 mg by mouth 2 (two) times daily as needed (pain).         [name] albuterol (PROVENTIL HFA;VENTOLIN HFA) 108 (90 BASE) MCG/ACT inhaler   Inhalation   Inhale 2 puffs into the lungs every 6 (six) hours as needed for wheezing or  shortness of breath.         . budesonide (PULMICORT) 0.5 MG/2ML nebulizer solution   Nebulization   Take 0.5 mg by nebulization 2 (two) times daily.         . digoxin (LANOXIN) 0.125 MG tablet   Oral   Take 1 tablet (0.125 mg total) by mouth daily.   30 tablet   2   . formoterol (PERFOROMIST) 20 MCG/2ML nebulizer solution   Nebulization   Take 20 mcg by nebulization 2 (two) times daily.         . furosemide (LASIX) 40 MG tablet   Oral   Take 1 tablet (40 mg total) by mouth daily.           Decreased on 03/30/2013.   [name] loratadine (CLARITIN)  10 MG tablet   Oral   Take 10 mg by mouth daily. For allergies         . losartan (COZAAR) 25 MG tablet   Oral   Take 0.5 tablets (12.5 mg total) by mouth daily.   30 tablet   0   . megestrol (MEGACE) 40 MG/ML suspension   Oral   Take 400 mg by mouth daily. 10 mls (2 teaspoonsful)         . metoprolol succinate (TOPROL-XL) 25 MG 24 hr tablet   Oral   Take 1 tablet (25 mg total) by mouth daily.   30 tablet   6     Dose decreased 03/30/2013   . montelukast (SINGULAIR) 10 MG tablet   Oral   Take 10 mg by mouth at bedtime.          . nitroGLYCERIN (NITROSTAT) 0.4 MG SL tablet   Sublingual   Place 1 tablet (0.4 mg total) under the tongue every 5 (five) minutes as needed for chest pain.   25 each   3   . omeprazole (PRILOSEC) 20 MG capsule   Oral   Take 20 mg by mouth daily.         . potassium chloride (K-DUR) 10 MEQ tablet   Oral   Take 10 mEq by mouth daily.          . predniSONE (DELTASONE) 5 MG tablet   Oral   Take 2.5 mg by mouth daily.          . simvastatin (ZOCOR) 40 MG tablet   Oral   Take 40 mg by mouth at bedtime.          . sotalol (BETAPACE) 120 MG tablet   Oral   Take 1 tablet (120 mg total) by mouth 2 (two) times daily.   60 tablet   6     Dose decreased 12/25/2012   . spironolactone (ALDACTONE) 25 MG tablet   Oral   Take 25 mg by mouth daily.         [name] warfarin (COUMADIN) 5 MG tablet   Oral   Take 2.5 mg by mouth at bedtime.          BP 111/49  Pulse 78  Temp(Src) 97.8 F (36.6 C) (Oral)  Resp 14  SpO2 100% Physical Exam Nursing note and vitals reviewed.  Constitutional: Pt is alert and appears stated age. Eyes: No injection, no scleral icterus. HENT: Atraumatic, airway open without erythema or exudate.  Respiratory: No respiratory distress. Equal breathing bilaterally. Cardiovascular: Normal rate. Extremities warm and well perfused.  Abdomen: Soft, non-tender. MSK: Extremities are atraumatic without  deformity. Skin: No rash, no wounds.  Neuro: No motor nor sensory deficit.     ED Course  Procedures (including critical care time) Labs Review Labs Reviewed  CBC WITH DIFFERENTIAL - Abnormal; Notable for the following:    RBC 3.62 (*)    Hemoglobin 8.6 (*)    HCT 27.9 (*)    MCV 77.1 (*)    MCH 23.8 (*)    RDW 16.8 (*)    All other components within normal limits  PROTIME-INR - Abnormal; Notable for the following:    Prothrombin Time 21.2 (*)    INR 1.90 (*)    All other components within normal limits  COMPREHENSIVE METABOLIC PANEL - Abnormal; Notable for the following:    Glucose, Bld 122 (*)    Albumin 3.3 (*)    GFR calc non Af Amer 82 (*)    All other components within normal limits  MAGNESIUM  TROPONIN I   Imaging Review Dg Chest 2 View  06/12/2013   CLINICAL DATA:  Problem of cardiac pacer  EXAM: CHEST  2 VIEW  COMPARISON:  02/09/2013  FINDINGS: Heart size within normal limits. Status post CABG. Dual lead cardiac pacer unchanged when compared to prior study. No infiltrate or effusion. Hyperinflation consistent with COPD is stable.  IMPRESSION: No acute finding   Electronically Signed   By: Esperanza Heir M.D.   On: 06/12/2013 21:13    EKG Interpretation   None       MDM   1. ICD (implantable cardioverter-defibrillator) in place    79 y.o. male w/ PMHx of CAD, V tach with ICD in place presents after ICD fired. Looks well here, no complaint. No chest pain prior to episode. EKG rate controlled a fib without signs of ischemia. CXR without consolidation or effusion. Labs remarkable for low magnesium and potassium. Replacement ordered. Device interrogated. I talked with device rep over the phone. Pt did receive shock after episode of VT with HR 190.  Cardiology consulted. Will come see patient in ED to help with disposition decision. Pt and wife updated at bedside. Pt doing well on re-eval.       I independently viewed, interpreted, and used in my medical  decision making all ordered lab and imaging tests. Medical Decision Making discussed with ED attending Darlys Gales, MD      Charm Barges, MD 06/13/13 [phone]

## 2013-06-12 NOTE — ED Notes (Signed)
ACID interrogated by Alma Downs RN  (charge nurse).

## 2013-06-12 NOTE — ED Notes (Signed)
Results of pacemaker interrogation given to Dr. Gregary Cromer.

## 2013-06-12 NOTE — ED Provider Notes (Addendum)
Date: 06/12/2013  Rate:79  Rhythm:  afib with PVCs  QRS Axis: normal  Intervals: normal  ST/T Wave abnormalities: nonspecific ST changes  Conduction Disutrbances:nonspecific intraventricular conduction delay  Narrative Interpretation:   Old EKG Reviewed: unchanged  79 yo wm patient presents with cc of ICD firing while at home.  Otherwise w/o complaint.    Plan for cardiology evaluation.  ICD interrogation pending.  Will replete Mg and K. VSS in ER.    12:15 AM VSS.  Pt t/o to Dr. Hyacinth Meeker pending cardiology c/s.     Darlys Gales, MD 06/12/13 1610  Darlys Gales, MD 06/13/13 [phone]

## 2013-06-12 NOTE — ED Notes (Signed)
Per ems--Pt from home. Reports defibrillator fired x2. Denies cp, sob. Defibrillator is [place] [facility].

## 2013-06-13 DIAGNOSIS — Z95810 Presence of automatic (implantable) cardiac defibrillator: Secondary | ICD-10-CM

## 2013-06-13 DIAGNOSIS — I472 Ventricular tachycardia: Secondary | ICD-10-CM

## 2013-06-13 DIAGNOSIS — E43 Unspecified severe protein-calorie malnutrition: Secondary | ICD-10-CM | POA: Diagnosis present

## 2013-06-13 LAB — BASIC METABOLIC PANEL
BUN: 9 mg/dL (ref 6–23)
BUN: 7 mg/dL (ref 6–23)
CO2: 22 mEq/L (ref 19–32)
CO2: 23 mEq/L (ref 19–32)
Chloride: 95 mEq/L — ABNORMAL LOW (ref 96–112)
Creatinine, Ser: 0.73 mg/dL (ref 0.50–1.35)
GFR calc non Af Amer: 82 mL/min — ABNORMAL LOW (ref >90)
Glucose, Bld: 102 mg/dL — ABNORMAL HIGH (ref 70–99)
Potassium: 3.4 mEq/L — ABNORMAL LOW (ref 3.5–5.1)
Potassium: 3.8 mEq/L (ref 3.5–5.1)
Sodium: 135 mEq/L (ref 135–145)

## 2013-06-13 LAB — CBC
HCT: 31.5 % — ABNORMAL LOW (ref 39.0–52.0)
Hemoglobin: 9.2 g/dL — ABNORMAL LOW (ref 13.0–17.0)
MCH: 22.7 pg — ABNORMAL LOW (ref 26.0–34.0)
MCHC: 29.2 g/dL — ABNORMAL LOW (ref 30.0–36.0)
Platelets: 337 10*3/uL (ref 150–400)
RBC: 4.05 MIL/uL — ABNORMAL LOW (ref 4.22–5.81)
WBC: 10.4 10*3/uL (ref 4.0–10.5)

## 2013-06-13 LAB — DIGOXIN LEVEL: Digoxin Level: 0.8 ng/mL (ref 0.8–2.0)

## 2013-06-13 LAB — PROTIME-INR: INR: 1.8 — ABNORMAL HIGH (ref 0.00–1.49)

## 2013-06-13 MED ORDER — DIGOXIN 125 MCG PO TABS
0.1250 mg | ORAL_TABLET | Freq: Every day | ORAL | Status: DC
  Administered 2013-06-13 – 2013-06-15 (×3): 0.125 mg via ORAL
  Filled 2013-06-13 (×3): qty 1

## 2013-06-13 MED ORDER — SPIRONOLACTONE 25 MG PO TABS
25.0000 mg | ORAL_TABLET | Freq: Every day | ORAL | Status: DC
  Administered 2013-06-13 – 2013-06-15 (×3): 25 mg via ORAL
  Filled 2013-06-13 (×3): qty 1

## 2013-06-13 MED ORDER — SODIUM CHLORIDE 0.9 % IV SOLN: 250.0000 mL | INTRAVENOUS | Status: DC | PRN

## 2013-06-13 MED ORDER — POTASSIUM CHLORIDE 10 MEQ/100ML IV SOLN
10.0000 meq | Freq: Once | INTRAVENOUS | Status: AC
  Administered 2013-06-13: 10 meq via INTRAVENOUS
  Filled 2013-06-13: qty 100

## 2013-06-13 MED ORDER — BUDESONIDE 0.5 MG/2ML IN SUSP
0.5000 mg | Freq: Two times a day (BID) | RESPIRATORY_TRACT | Status: DC
  Administered 2013-06-13 – 2013-06-15 (×5): 0.5 mg via RESPIRATORY_TRACT
  Filled 2013-06-13 (×7): qty 2

## 2013-06-13 MED ORDER — POTASSIUM CHLORIDE CRYS ER 20 MEQ PO TBCR
20.0000 meq | EXTENDED_RELEASE_TABLET | Freq: Once | ORAL | Status: AC
  Administered 2013-06-13: 20 meq via ORAL
  Filled 2013-06-13: qty 1

## 2013-06-13 MED ORDER — WARFARIN SODIUM 4 MG PO TABS
4.0000 mg | ORAL_TABLET | Freq: Once | ORAL | Status: AC
  Administered 2013-06-13: 4 mg via ORAL
  Filled 2013-06-13: qty 1

## 2013-06-13 MED ORDER — SIMVASTATIN 40 MG PO TABS
40.0000 mg | ORAL_TABLET | Freq: Every day | ORAL | Status: DC
  Administered 2013-06-13 – 2013-06-14 (×2): 40 mg via ORAL
  Filled 2013-06-13 (×3): qty 1

## 2013-06-13 MED ORDER — ONDANSETRON HCL 4 MG/2ML IJ SOLN: 4.0000 mg | Freq: Four times a day (QID) | INTRAMUSCULAR | Status: DC | PRN

## 2013-06-13 MED ORDER — WARFARIN - PHARMACIST DOSING INPATIENT: Freq: Every day | Status: DC

## 2013-06-13 MED ORDER — LOSARTAN POTASSIUM 25 MG PO TABS
12.5000 mg | ORAL_TABLET | Freq: Every day | ORAL | Status: DC
  Administered 2013-06-13 – 2013-06-15 (×3): 12.5 mg via ORAL
  Filled 2013-06-13 (×3): qty 0.5

## 2013-06-13 MED ORDER — POTASSIUM CHLORIDE ER 10 MEQ PO TBCR
10.0000 meq | EXTENDED_RELEASE_TABLET | Freq: Every day | ORAL | Status: DC
  Administered 2013-06-13 – 2013-06-15 (×3): 10 meq via ORAL
  Filled 2013-06-13 (×3): qty 1

## 2013-06-13 MED ORDER — WARFARIN - PHYSICIAN DOSING INPATIENT: Freq: Every day | Status: DC

## 2013-06-13 MED ORDER — WARFARIN SODIUM 2.5 MG PO TABS
2.5000 mg | ORAL_TABLET | Freq: Every day | ORAL | Status: DC
  Filled 2013-06-13: qty 1

## 2013-06-13 MED ORDER — SOTALOL HCL 120 MG PO TABS
120.0000 mg | ORAL_TABLET | Freq: Two times a day (BID) | ORAL | Status: DC
  Administered 2013-06-13: 120 mg via ORAL
  Filled 2013-06-13 (×2): qty 1

## 2013-06-13 MED ORDER — ARFORMOTEROL TARTRATE 15 MCG/2ML IN NEBU
15.0000 ug | INHALATION_SOLUTION | Freq: Two times a day (BID) | RESPIRATORY_TRACT | Status: DC
  Administered 2013-06-13 – 2013-06-15 (×5): 15 ug via RESPIRATORY_TRACT
  Filled 2013-06-13 (×8): qty 2

## 2013-06-13 MED ORDER — MONTELUKAST SODIUM 10 MG PO TABS
10.0000 mg | ORAL_TABLET | Freq: Every day | ORAL | Status: DC
  Administered 2013-06-13 – 2013-06-14 (×2): 10 mg via ORAL
  Filled 2013-06-13 (×3): qty 1

## 2013-06-13 MED ORDER — METOPROLOL SUCCINATE ER 25 MG PO TB24
25.0000 mg | ORAL_TABLET | Freq: Every day | ORAL | Status: DC
  Administered 2013-06-13 – 2013-06-15 (×3): 25 mg via ORAL
  Filled 2013-06-13 (×3): qty 1

## 2013-06-13 MED ORDER — ALBUTEROL SULFATE HFA 108 (90 BASE) MCG/ACT IN AERS: 2.0000 | INHALATION_SPRAY | Freq: Four times a day (QID) | RESPIRATORY_TRACT | Status: DC | PRN

## 2013-06-13 MED ORDER — BOOST / RESOURCE BREEZE PO LIQD
1.0000 | Freq: Three times a day (TID) | ORAL | Status: DC
  Administered 2013-06-13 (×2): 1 via ORAL

## 2013-06-13 MED ORDER — PREDNISONE 2.5 MG PO TABS
2.5000 mg | ORAL_TABLET | Freq: Every day | ORAL | Status: DC
Start: 2013-06-13 — End: 2013-06-15
  Administered 2013-06-13 – 2013-06-15 (×3): 2.5 mg via ORAL
  Filled 2013-06-13 (×3): qty 1

## 2013-06-13 MED ORDER — ACETAMINOPHEN 325 MG PO TABS
650.0000 mg | ORAL_TABLET | ORAL | Status: DC | PRN
  Administered 2013-06-14: 650 mg via ORAL
  Filled 2013-06-13: qty 2

## 2013-06-13 MED ORDER — PANTOPRAZOLE SODIUM 40 MG PO TBEC
40.0000 mg | DELAYED_RELEASE_TABLET | Freq: Every day | ORAL | Status: DC
  Administered 2013-06-13 – 2013-06-15 (×3): 40 mg via ORAL
  Filled 2013-06-13 (×3): qty 1

## 2013-06-13 MED ORDER — SODIUM CHLORIDE 0.9 % IJ SOLN
3.0000 mL | Freq: Two times a day (BID) | INTRAMUSCULAR | Status: DC
  Administered 2013-06-13 – 2013-06-15 (×4): 3 mL via INTRAVENOUS

## 2013-06-13 MED ORDER — NITROGLYCERIN 0.4 MG SL SUBL
0.4000 mg | SUBLINGUAL_TABLET | SUBLINGUAL | Status: DC | PRN
Start: 2013-06-13 — End: 2013-06-15

## 2013-06-13 MED ORDER — MEGESTROL ACETATE 40 MG/ML PO SUSP
400.0000 mg | Freq: Every day | ORAL | Status: DC
Start: 2013-06-13 — End: 2013-06-15
  Administered 2013-06-13 – 2013-06-15 (×3): 400 mg via ORAL
  Filled 2013-06-13 (×3): qty 10

## 2013-06-13 MED ORDER — ASPIRIN EC 81 MG PO TBEC
81.0000 mg | DELAYED_RELEASE_TABLET | Freq: Every day | ORAL | Status: DC
  Administered 2013-06-14 – 2013-06-15 (×2): 81 mg via ORAL
  Filled 2013-06-13 (×2): qty 1

## 2013-06-13 MED ORDER — SODIUM CHLORIDE 0.9 % IJ SOLN
3.0000 mL | INTRAMUSCULAR | Status: DC | PRN
  Administered 2013-06-15: 3 mL via INTRAVENOUS

## 2013-06-13 MED ORDER — SOTALOL HCL 80 MG PO TABS
160.0000 mg | ORAL_TABLET | Freq: Two times a day (BID) | ORAL | Status: DC
  Administered 2013-06-13 – 2013-06-15 (×4): 160 mg via ORAL
  Filled 2013-06-13 (×5): qty 2

## 2013-06-13 NOTE — Progress Notes (Signed)
    Primary cardiologist: Dr. Prentice Docker and Dr. Hillis Range  Subjective:    Comfortable this morning, no specific complaints.  Objective:   Temp:  [97.8 F (36.6 C)-98.3 F (36.8 C)] 97.9 F (36.6 C) (11/08 0405) Pulse Rate:  [73-84] 80 (11/08 0405) Resp:  [14-18] 16 (11/08 0405) BP: (104-129)/(48-73) 112/69 mmHg (11/08 0405) SpO2:  [96 %-100 %] 96 % (11/08 0405) Weight:  [145 lb 11.6 oz (66.1 kg)] 145 lb 11.6 oz (66.1 kg) (11/08 0405) Last BM Date: 06/13/13  Filed Weights   06/13/13 0405  Weight: 145 lb 11.6 oz (66.1 kg)    Telemetry: Atrial fibrillation.  Exam:  General: No distress.  Lungs: Clear, decreased breath sounds.  Cardiac: Irregularly irregular.  Extremities: No pitting.   Lab Results:  Basic Metabolic Panel:  Recent Labs Lab 06/12/13 2122 06/13/13 0505  NA 136 135  K 3.6 3.4*  CL 100 99  CO2 24 22  GLUCOSE 122* 102*  BUN 9 9  CREATININE 0.79 0.78  CALCIUM 9.0 9.0  MG 1.6  --     Liver Function Tests:  Recent Labs Lab 06/12/13 2122  AST 13  ALT 8  ALKPHOS 64  BILITOT 0.3  PROT 6.3  ALBUMIN 3.3*    CBC:  Recent Labs Lab 06/12/13 2122 06/13/13 0505  WBC 7.6 10.4  HGB 8.6* 9.2*  HCT 27.9* 31.5*  MCV 77.1* 77.8*  PLT 262 337    Cardiac Enzymes:  Recent Labs Lab 06/12/13 2123 06/13/13 0505  TROPONINI <0.30 <0.30    Coagulation:  Recent Labs Lab 06/12/13 2122 06/13/13 0505  INR 1.90* 1.80*     Medications:   Scheduled Medications: . arformoterol  15 mcg Nebulization Q12H  . [START ON 06/14/2013] aspirin EC  81 mg Oral Daily  . budesonide  0.5 mg Nebulization BID  . digoxin  0.125 mg Oral Daily  . losartan  12.5 mg Oral Daily  . megestrol  400 mg Oral Daily  . metoprolol succinate  25 mg Oral Daily  . montelukast  10 mg Oral QHS  . pantoprazole  40 mg Oral Daily  . potassium chloride  10 mEq Oral Daily  . predniSONE  2.5 mg Oral Daily  . simvastatin  40 mg Oral QHS  . sodium chloride  3 mL  Intravenous Q12H  . sotalol  120 mg Oral Q12H  . spironolactone  25 mg Oral Daily  . warfarin  2.5 mg Oral QHS  . Warfarin - Physician Dosing Inpatient   Does not apply q1800      PRN Medications:  sodium chloride, acetaminophen, albuterol, nitroGLYCERIN, ondansetron (ZOFRAN) IV, sodium chloride   Assessment:   1. Status post ICD shock for VT based on [facility] check in ER.  2. Ischemic cardiomyopathy, LVEF 30-35%.  3. History of recurrent VT status post ICD and VT ablation (April). He is on Sotalol and beta blocker.  4. Atrial fibrillation, on Coumadin, rate controlled at this time.  5. Mitral regurgitation, at least moderate. No active heart failure symptoms.   Plan/Discussion:    Spoke with Nehemiah Settle on the EP team, familiar with the patient. She will do a full device interrogation today. Based on results, we can determine whether patient can be discharged for close followup with EP as an outpatient next week, versus needing to stay in house for further medication adjustments and EP consultation.   Jonelle Sidle, M.D., F.A.C.C.

## 2013-06-13 NOTE — Progress Notes (Signed)
INITIAL NUTRITION ASSESSMENT  DOCUMENTATION CODES Per approved criteria  -Severe malnutrition in the context of chronic illness   INTERVENTION:  1. Resource Breeze po TID, each supplement provides 250 kcal and 9 grams of protein.  2. Encourage protein intake, supplements at home.  NUTRITION DIAGNOSIS: Malnutrition related to chronic illness as evidenced by severe fat and muscle wasting.   Goal: Pt to meet >/= 90% of their estimated nutrition needs   Monitor:  PO intake, supplement acceptance, weight trend, labs   Reason for Assessment: Pt identified as at nutrition risk on the Malnutrition Screen Tool  79 y.o. male  Admitting Dx: <principal problem not specified>  ASSESSMENT: Pt admitted after his ICD fired.  Pt reports poor appetite x 2 years and has been on Megace for about the same time frame and feels it has helped. Pt only ate a few bites at lunch, did not like the food and not hungry. Pt does not like ensure but is willing to try Breeze. Potassium is low and is being repleted.  Pt has had 10% weight loss in 9 months.  Nutrition Focused Physical Exam:  Subcutaneous Fat:  Orbital Region: WNL Upper Arm Region: mild-moderate wasting  Thoracic and Lumbar Region: severe wasting  Muscle:  Temple Region: severe wasting Clavicle Bone Region: severe wasting Clavicle and Acromion Bone Region: severe wasting Scapular Bone Region: severe wasting Dorsal Hand: mild-moderate wasting Patellar Region: mild-moderate wasting Anterior Thigh Region: mild-moderate wasting Posterior Calf Region: mild-moderate wasting  Edema: not present   Height: Ht Readings from Last 1 Encounters:  06/13/13 5\' 9"  (1.753 m)    Weight: Wt Readings from Last 1 Encounters:  06/13/13 145 lb 11.6 oz (66.1 kg)    Ideal Body Weight: 72.7 kg   % Ideal Body Weight: 91%  Wt Readings from Last 10 Encounters:  06/13/13 145 lb 11.6 oz (66.1 kg)  04/03/13 144 lb (65.318 kg)  03/30/13 148 lb  (67.132 kg)  01/28/13 144 lb (65.318 kg)  12/25/12 149 lb 12.8 oz (67.949 kg)  12/24/12 151 lb 12.8 oz (68.856 kg)  11/08/12 149 lb 11.1 oz (67.9 kg)  11/08/12 149 lb 11.1 oz (67.9 kg)  10/22/12 154 lb (69.854 kg)  09/15/12 162 lb (73.483 kg)    Usual Body Weight: 162 lb   % Usual Body Weight: 90%  BMI:  Body mass index is 21.51 kg/(m^2).  Estimated Nutritional Needs: Kcal: 1800-2000 Protein: 80-90 grams Fluid: > 1.8 L/day  Skin: no issues noted  Diet Order: Cardiac  EDUCATION NEEDS: -Education needs addressed  No intake or output data in the 24 hours ending 06/13/13 0907  Last BM: 11/8   Labs:   Recent Labs Lab 06/12/13 2122 06/13/13 0505  NA 136 135  K 3.6 3.4*  CL 100 99  CO2 24 22  BUN 9 9  CREATININE 0.79 0.78  CALCIUM 9.0 9.0  MG 1.6  --   GLUCOSE 122* 102*    CBG (last 3)  No results found for this basename: GLUCAP,  in the last 72 hours  Scheduled Meds: . arformoterol  15 mcg Nebulization Q12H  . [START ON 06/14/2013] aspirin EC  81 mg Oral Daily  . budesonide  0.5 mg Nebulization BID  . digoxin  0.125 mg Oral Daily  . losartan  12.5 mg Oral Daily  . megestrol  400 mg Oral Daily  . metoprolol succinate  25 mg Oral Daily  . montelukast  10 mg Oral QHS  . pantoprazole  40 mg  Oral Daily  . potassium chloride  10 mEq Oral Daily  . predniSONE  2.5 mg Oral Daily  . simvastatin  40 mg Oral QHS  . sodium chloride  3 mL Intravenous Q12H  . sotalol  120 mg Oral Q12H  . spironolactone  25 mg Oral Daily  . warfarin  2.5 mg Oral QHS  . Warfarin - Physician Dosing Inpatient   Does not apply q1800    Continuous Infusions:   Past Medical History  Diagnosis Date  . Hypertension   . Coronary artery disease     a. CABG 03/2006. b. NSTEMI 03/2012 following VT likely demand ischemia - grafts patent at cath.  . Diabetes mellitus   . Other specified forms of chronic ischemic heart disease   . Syncope and collapse   . Automatic implantable cardiac  defibrillator in situ     QRS duration 120 ms no typical left bundle branch block.  . Atrial fibrillation     Taking sotalol, intolerant to amiodarone  . Valvular heart disease     Mild to moderate mitral valve insufficiency and mild aortic insufficiency  . Chronic airway obstruction, not elsewhere classified   . Old myocardial infarction   . Other and unspecified hyperlipidemia   . Congestive heart failure, unspecified     a. Ischemic cardiomyopathy ejection fraction 30-35% previously. b. Down to 15% by cath 03/2012.  [name] Cerebrovascular disease, unspecified   . Other mechanical complication of other internal orthopedic device, implant, and graft   . Carotid artery disease     s/p LCEA 2007.  [name] Head and neck cancer     Receiving radiation therapy.  . Orthostatic hypotension   . Ventricular tachycardia     a. S/p ICD implantation. b. Recurrent in 03/2012 with medication adjustment.;  c. s/p RFCA 11/2012 (Dr. Fawn Kirk)     Past Surgical History  Procedure Laterality Date  . Repeat otif left knee with figure-of-eight tension band.    . Implantation of a dual-chamber implantable  06/18/2007  . Left carotid endarterectomy    . Coronary artery bypass graft  04/04/2006  . Vt ablation  11/06/12    Ischemic VT ablation by Dr Ferne Coe RD, LDN, CNSC [phone] Pager [phone] After Hours Pager

## 2013-06-13 NOTE — H&P (Signed)
History and Physical  Patient ID: [name] MRN: [id number], SOB: Aug 29, 1928 79 y.o. Date of Encounter: 06/13/2013, 1:39 AM  Primary Physician: Louie Boston, MD Primary Cardiologist: Lavonia Dana  Chief Complaint: ICD firing  HPI: 79 y.o. male w/ PMHx significant for CAD s/p CABG, DM2, afib, VT s/p ICD, s/p ablation who presented to [facility] on 06/13/2013 with complaints of his ICD firing x 1. Was watching TV at the time. Feeling well recently except for rather severe diarrhea on Wednesday night that ultimately resolved on its own. Right before getting the "big shock" he felt a small shock but no preceding chest pain or palpitations. Occurred prior to his nebulizer treatment. Denies heart failure symptoms. No LE edema, no PND or orthopnea. No new meds except montelukast. Noted that at last visit, beta blocker reduced due to fatigue symptoms and ICD adjusted to increase VT1 zone.  Device interrogated which confirmed ICD firing for at least one appropriate ICD firing for VT (a-v dissociation). Appears ATP attempted as well.  EKG reveals afib with controlled rate, PVCs. QTc of 450 . CXR was without acute cardiopulmonary abnormalities. Labs are significant for K of 3.6, Mg of 1.6. Digoxin pending.  Currently feels well, no complaints.  Past Medical History  Diagnosis Date  . Hypertension   . Coronary artery disease     a. CABG 03/2006. b. NSTEMI 03/2012 following VT likely demand ischemia - grafts patent at cath.  . Diabetes mellitus   . Other specified forms of chronic ischemic heart disease   . Syncope and collapse   . Automatic implantable cardiac defibrillator in situ     QRS duration 120 ms no typical left bundle branch block.  . Atrial fibrillation     Taking sotalol, intolerant to amiodarone  . Valvular heart disease     Mild to moderate mitral valve insufficiency and mild aortic insufficiency  . Chronic airway obstruction, not elsewhere classified   . Old  myocardial infarction   . Other and unspecified hyperlipidemia   . Congestive heart failure, unspecified     a. Ischemic cardiomyopathy ejection fraction 30-35% previously. b. Down to 15% by cath 03/2012.  [name] Cerebrovascular disease, unspecified   . Other mechanical complication of other internal orthopedic device, implant, and graft   . Carotid artery disease     s/p LCEA 2007.  [name] Head and neck cancer     Receiving radiation therapy.  . Orthostatic hypotension   . Ventricular tachycardia     a. S/p ICD implantation. b. Recurrent in 03/2012 with medication adjustment.;  c. s/p RFCA 11/2012 (Dr. Fawn Kirk)      Surgical History:  Past Surgical History  Procedure Laterality Date  . Repeat otif left knee with figure-of-eight tension band.    . Implantation of a dual-chamber implantable  06/18/2007  . Left carotid endarterectomy    . Coronary artery bypass graft  04/04/2006  . Vt ablation  11/06/12    Ischemic VT ablation by Dr Johney Frame     Home Meds: Prior to Admission medications   Medication Sig Start Date End Date Taking? Authorizing Provider  acetaminophen (TYLENOL) 500 MG tablet Take 1,000 mg by mouth 2 (two) times daily as needed (pain).   Yes Historical Provider, MD  albuterol (PROVENTIL HFA;VENTOLIN HFA) 108 (90 BASE) MCG/ACT inhaler Inhale 2 puffs into the lungs every 6 (six) hours as needed for wheezing or shortness of breath.   Yes Historical Provider, MD  budesonide (PULMICORT) 0.5 MG/2ML nebulizer solution Take  0.5 mg by nebulization 2 (two) times daily.   Yes Historical Provider, MD  digoxin (LANOXIN) 0.125 MG tablet Take 1 tablet (0.125 mg total) by mouth daily. 01/28/13  Yes Richarda Overlie, MD  formoterol (PERFOROMIST) 20 MCG/2ML nebulizer solution Take 20 mcg by nebulization 2 (two) times daily.   Yes Historical Provider, MD  furosemide (LASIX) 40 MG tablet Take 1 tablet (40 mg total) by mouth daily. 03/30/13  Yes Hillis Range, MD  loratadine (CLARITIN) 10 MG tablet Take 10 mg by mouth  daily. For allergies   Yes Historical Provider, MD  losartan (COZAAR) 25 MG tablet Take 0.5 tablets (12.5 mg total) by mouth daily. 01/28/13  Yes Richarda Overlie, MD  megestrol (MEGACE) 40 MG/ML suspension Take 400 mg by mouth daily. 10 mls (2 teaspoonsful) 10/23/12  Yes Rollene Rotunda, MD  metoprolol succinate (TOPROL-XL) 25 MG 24 hr tablet Take 1 tablet (25 mg total) by mouth daily. 03/30/13  Yes Hillis Range, MD  montelukast (SINGULAIR) 10 MG tablet Take 10 mg by mouth at bedtime.  05/22/13  Yes Historical Provider, MD  nitroGLYCERIN (NITROSTAT) 0.4 MG SL tablet Place 1 tablet (0.4 mg total) under the tongue every 5 (five) minutes as needed for chest pain. 11/26/11  Yes June Leap, MD  omeprazole (PRILOSEC) 20 MG capsule Take 20 mg by mouth daily.   Yes Historical Provider, MD  potassium chloride (K-DUR) 10 MEQ tablet Take 10 mEq by mouth daily.  01/22/13  Yes Historical Provider, MD  predniSONE (DELTASONE) 5 MG tablet Take 2.5 mg by mouth daily.  08/28/11  Yes June Leap, MD  simvastatin (ZOCOR) 40 MG tablet Take 40 mg by mouth at bedtime.    Yes Historical Provider, MD  sotalol (BETAPACE) 120 MG tablet Take 1 tablet (120 mg total) by mouth 2 (two) times daily. 12/25/12  Yes Hillis Range, MD  spironolactone (ALDACTONE) 25 MG tablet Take 25 mg by mouth daily.   Yes Historical Provider, MD  warfarin (COUMADIN) 5 MG tablet Take 2.5 mg by mouth at bedtime.   Yes Historical Provider, MD    Allergies: No Known Allergies  History   Social History  . Marital Status: Married    Spouse Name: N/A    Number of Children: N/A  . Years of Education: N/A   Occupational History  . RETIRED    Social History Main Topics  . Smoking status: Former Smoker -- 2.00 packs/day for 60 years    Types: Cigarettes    Quit date: 08/06/1990  . Smokeless tobacco: Never Used  . Alcohol Use: No  . Drug Use: No  . Sexual Activity: Yes   Other Topics Concern  . Not on file   Social History Narrative   Pt does  not get regular exercise.     No family history on file.  Review of Systems: General: negative for chills, fever, night sweats or weight changes.  Cardiovascular: see HPI. Dermatological: negative for rash Respiratory: negative for cough or wheezing Urologic: negative for hematuria Abdominal: negative for nausea, vomiting, , bright red blood per rectum, melena, or hematemesis. +diarrhea. Neurologic: negative for visual changes, syncope, or dizziness All other systems reviewed and are otherwise negative except as noted above.  Labs:   Lab Results  Component Value Date   WBC 7.6 06/12/2013   HGB 8.6* 06/12/2013   HCT 27.9* 06/12/2013   MCV 77.1* 06/12/2013   PLT 262 06/12/2013    Recent Labs Lab 06/12/13 2122  NA 136  K 3.6  CL 100  CO2 24  BUN 9  CREATININE 0.79  CALCIUM 9.0  PROT 6.3  BILITOT 0.3  ALKPHOS 64  ALT 8  AST 13  GLUCOSE 122*    Recent Labs  06/12/13 2123  TROPONINI <0.30   Lab Results  Component Value Date   CHOL 134 03/06/2012   HDL 32* 03/06/2012   LDLCALC 67 03/06/2012   TRIG 176* 03/06/2012   No results found for this basename: DDIMER    Radiology/Studies:  Dg Chest 2 View  06/12/2013   CLINICAL DATA:  Problem of cardiac pacer  EXAM: CHEST  2 VIEW  COMPARISON:  02/09/2013  FINDINGS: Heart size within normal limits. Status post CABG. Dual lead cardiac pacer unchanged when compared to prior study. No infiltrate or effusion. Hyperinflation consistent with COPD is stable.  IMPRESSION: No acute finding   Electronically Signed   By: Esperanza Heir M.D.   On: 06/12/2013 21:13     EKG: afib with controlled rate, wandering baseline  Telemetry with freq PVCs, occasional pacing.  Physical Exam: Blood pressure 110/70, pulse 84, temperature 98.2 F (36.8 C), temperature source Oral, resp. rate 14, SpO2 100.00%. General:  in no acute distress. Frail appearing Head: Normocephalic, atraumatic, sclera non-icteric, nares are without discharge Neck: Supple.  Negative for carotid bruits. JVD not elevated. Lungs: poor airmovment, no wheezing Heart: RRR with S1 S2. No murmurs, rubs, or gallops appreciated. Abdomen: Soft, non-tender, non-distended with normoactive bowel sounds. No rebound/guarding. No obvious abdominal masses. Msk:  Strength and tone appear normal for age. Extremities: No edema. No clubbing or cyanosis. Distal pedal pulses are 2+ and equal bilaterally. Missing partial digits on right hand. Neuro: Alert and oriented X 3. Moves all extremities spontaneously. Psych:  Responds to questions appropriately with a normal affect.   Problem List. 1. Vtach s/p successful ICD therapy 2. Ischemic CMP with reduced systolic function, appears euvolemic by exam 3. Paroxysmal afib 4. Mild hypokalemia and hypomagnesia in the setting of recent diarrhea 5.  Adnemia  ASSESSMENT AND PLAN:  79 y.o. male w/ PMHx significant for CAD s/p CABG, DM2, afib, VT s/p ICD, s/p ablation who presented to [facility] on 06/13/2013 with complaints of his ICD firing for appropriate Vtach,  Workup for cause of Vtach unrevealing thus far. Contributing factors include electrolyte abnormalities, recent reduction in beta blocker. No evidence of ischemia as cause. Afebrile. Digoxin level pending.  Admit to observation to replace lytes and assess for additional reversible causes of his arrhythmia. Continue home medications. EP input will be helpful to see if additional medication changes (sotalolol, beta blocker) needed.   Currently in afib, intermittent based upon device interrogation. Continue warfarin. Anemia- near baseline.  Prophylaxis: Taking PO On coumadin  Full code  Signed, Brieana Shimmin C. MD 06/13/2013, 1:39 AM

## 2013-06-13 NOTE — ED Provider Notes (Signed)
I saw and evaluated the patient, reviewed the resident's note and I agree with the findings and plan.  EKG Interpretation   None       AICD fired after VT episode.  Cardiology c/s pending.  Repletion of Mag and K.    12:55 AM Pt t/o to Dr. Hyacinth Meeker pending cards evaluation.   Darlys Gales, MD 06/13/13 [id number]

## 2013-06-13 NOTE — ED Notes (Signed)
Report given to 2 west unit nurse , denies chest pain or chests discomfort at time of transport , respirations unlabored , IV site intact.

## 2013-06-13 NOTE — Progress Notes (Addendum)
Full interrogation of ICD shows 4 new episodes since last interrogation on 03/30/2013. On 05/31/2013 - monomorphic VT at 285 msec, ATP x1 unsuccessful, accelerates to VT-2, successfully terminated 25j shock x1 On 06/11/2013 - monomorphic VT at 345 msec and 315 msec, both succesfully terminated with ATP On 06/12/2013 (admission) - monomorphic VT, ATP x1 unsuccessful, accelerates to VT-2, successfully terminated 25j shock x1 All appropriate ICD therapies.  Discussed with Dr. Johney Frame via phone. ECG shows QTc ~490 msec. Will increase sotalol dose to 160 mg twice daily. Monitor QTc and BMET and Mg daily. Will also up-titrate metoprolol dose as BP allows. If no recurrent VT, plan for DC home on Monday.    Hillis Range MD

## 2013-06-13 NOTE — Progress Notes (Signed)
ANTICOAGULATION CONSULT NOTE - Initial Consult  Pharmacy Consult for Coumadin Indication: atrial fibrillation  No Known Allergies  Patient Measurements: Height: 5\' 9"  (175.3 cm) Weight: 145 lb 11.6 oz (66.1 kg) IBW/kg (Calculated) : 70.7  Vital Signs: Temp: 98.2 F (36.8 C) (11/08 1404) Temp src: Oral (11/08 1404) BP: 99/50 mmHg (11/08 1404) Pulse Rate: 78 (11/08 1404)  Labs:  Recent Labs  06/12/13 2122 06/12/13 2123 06/13/13 0505  HGB 8.6*  --  9.2*  HCT 27.9*  --  31.5*  PLT 262  --  337  LABPROT 21.2*  --  20.4*  INR 1.90*  --  1.80*  CREATININE 0.79  --  0.78  TROPONINI  --  <0.30 <0.30    Estimated Creatinine Clearance: 67.7 ml/min (by C-G formula based on Cr of 0.78).   Medical History: Past Medical History  Diagnosis Date  . Hypertension   . Coronary artery disease     a. CABG 03/2006. b. NSTEMI 03/2012 following VT likely demand ischemia - grafts patent at cath.  . Diabetes mellitus   . Other specified forms of chronic ischemic heart disease   . Syncope and collapse   . Automatic implantable cardiac defibrillator in situ     QRS duration 120 ms no typical left bundle branch block.  . Atrial fibrillation     Taking sotalol, intolerant to amiodarone  . Valvular heart disease     Mild to moderate mitral valve insufficiency and mild aortic insufficiency  . Chronic airway obstruction, not elsewhere classified   . Old myocardial infarction   . Other and unspecified hyperlipidemia   . Congestive heart failure, unspecified     a. Ischemic cardiomyopathy ejection fraction 30-35% previously. b. Down to 15% by cath 03/2012.  [name] Cerebrovascular disease, unspecified   . Other mechanical complication of other internal orthopedic device, implant, and graft   . Carotid artery disease     s/p LCEA 2007.  [name] Head and neck cancer     Receiving radiation therapy.  . Orthostatic hypotension   . Ventricular tachycardia     a. S/p ICD implantation. b. Recurrent in 03/2012  with medication adjustment.;  c. s/p RFCA 11/2012 (Dr. Fawn Kirk)     Medications:  Prescriptions prior to admission  Medication Sig Dispense Refill  . acetaminophen (TYLENOL) 500 MG tablet Take 1,000 mg by mouth 2 (two) times daily as needed (pain).      [name] albuterol (PROVENTIL HFA;VENTOLIN HFA) 108 (90 BASE) MCG/ACT inhaler Inhale 2 puffs into the lungs every 6 (six) hours as needed for wheezing or shortness of breath.      . budesonide (PULMICORT) 0.5 MG/2ML nebulizer solution Take 0.5 mg by nebulization 2 (two) times daily.      . digoxin (LANOXIN) 0.125 MG tablet Take 1 tablet (0.125 mg total) by mouth daily.  30 tablet  2  . formoterol (PERFOROMIST) 20 MCG/2ML nebulizer solution Take 20 mcg by nebulization 2 (two) times daily.      . furosemide (LASIX) 40 MG tablet Take 1 tablet (40 mg total) by mouth daily.      [name] loratadine (CLARITIN) 10 MG tablet Take 10 mg by mouth daily. For allergies      . losartan (COZAAR) 25 MG tablet Take 0.5 tablets (12.5 mg total) by mouth daily.  30 tablet  0  . megestrol (MEGACE) 40 MG/ML suspension Take 400 mg by mouth daily. 10 mls (2 teaspoonsful)      . metoprolol succinate (TOPROL-XL) 25 MG 24  hr tablet Take 1 tablet (25 mg total) by mouth daily.  30 tablet  6  . montelukast (SINGULAIR) 10 MG tablet Take 10 mg by mouth at bedtime.       . nitroGLYCERIN (NITROSTAT) 0.4 MG SL tablet Place 1 tablet (0.4 mg total) under the tongue every 5 (five) minutes as needed for chest pain.  25 each  3  . omeprazole (PRILOSEC) 20 MG capsule Take 20 mg by mouth daily.      . potassium chloride (K-DUR) 10 MEQ tablet Take 10 mEq by mouth daily.       . predniSONE (DELTASONE) 5 MG tablet Take 2.5 mg by mouth daily.       . simvastatin (ZOCOR) 40 MG tablet Take 40 mg by mouth at bedtime.       . sotalol (BETAPACE) 120 MG tablet Take 1 tablet (120 mg total) by mouth 2 (two) times daily.  60 tablet  6  . spironolactone (ALDACTONE) 25 MG tablet Take 25 mg by mouth daily.      [name]  warfarin (COUMADIN) 5 MG tablet Take 2.5 mg by mouth at bedtime.        Assessment: 79 y.o. male presented due to [facility]. Jude's defibrillator fired x 2. Pt on coumadin PTA for afib (home dose 2.5mg  daily). INR subtherapeutic (down to 1.8 today) - appears dose missed yesterday night. Hgb low but stable at 9.2, plt ok. No bleeding noted.  Goal of Therapy:  INR 2-3 Monitor platelets by anticoagulation protocol: Yes   Plan:  1) Coumadin 4mg  po tonight 2) Daily INR  Christoper Fabian, PharmD, BCPS Clinical pharmacist, pager [phone] 06/13/2013,5:43 PM

## 2013-06-14 DIAGNOSIS — I472 Ventricular tachycardia, unspecified: Principal | ICD-10-CM

## 2013-06-14 DIAGNOSIS — I4891 Unspecified atrial fibrillation: Secondary | ICD-10-CM

## 2013-06-14 LAB — BASIC METABOLIC PANEL
BUN: 6 mg/dL (ref 6–23)
CO2: 23 mEq/L (ref 19–32)
Chloride: 105 mEq/L (ref 96–112)
GFR calc Af Amer: >90 mL/min (ref >90)
Potassium: 4.1 mEq/L (ref 3.5–5.1)
Sodium: 139 mEq/L (ref 135–145)

## 2013-06-14 LAB — MAGNESIUM: Magnesium: 2.2 mg/dL (ref 1.5–2.5)

## 2013-06-14 LAB — PROTIME-INR
INR: 1.75 — ABNORMAL HIGH (ref 0.00–1.49)
Prothrombin Time: 19.9 seconds — ABNORMAL HIGH (ref 11.6–15.2)

## 2013-06-14 MED ORDER — WARFARIN SODIUM 4 MG PO TABS
4.0000 mg | ORAL_TABLET | Freq: Once | ORAL | Status: AC
  Administered 2013-06-14: 4 mg via ORAL
  Filled 2013-06-14: qty 1

## 2013-06-14 NOTE — Progress Notes (Signed)
     Primary cardiologist: Dr. Prentice Docker and Dr. Hillis Range  Subjective:    Slept well. No chest pain, palpitations, or device shocks.  Objective:   Temp:  [97.6 F (36.4 C)-98.2 F (36.8 C)] 97.7 F (36.5 C) (11/09 0615) Pulse Rate:  [64-79] 78 (11/09 0615) Resp:  [16-18] 18 (11/09 0615) BP: (99-123)/(48-62) 105/48 mmHg (11/09 0615) SpO2:  [97 %-99 %] 98 % (11/09 0615) Weight:  [143 lb 4.8 oz (65 kg)] 143 lb 4.8 oz (65 kg) (11/09 0615) Last BM Date: 06/13/13  Filed Weights   06/13/13 0405 06/14/13 0615  Weight: 145 lb 11.6 oz (66.1 kg) 143 lb 4.8 oz (65 kg)    Telemetry: Paced rhythm, PVCs.  Exam:  General: No distress.  Lungs: Clear, decreased breath sounds.  Cardiac: Irregularly irregular.  Extremities: No pitting.   Lab Results:  Basic Metabolic Panel:  Recent Labs Lab 06/12/13 2122 06/13/13 0505 06/13/13 1650 06/14/13 0550  NA 136 135 130* 139  K 3.6 3.4* 3.8 4.1  CL 100 99 95* 105  CO2 24 22 23 23   GLUCOSE 122* 102* 105* 99  BUN 9 9 7 6   CREATININE 0.79 0.78 0.73 0.78  CALCIUM 9.0 9.0 9.2 9.3  MG 1.6  --   --  2.2    Liver Function Tests:  Recent Labs Lab 06/12/13 2122  AST 13  ALT 8  ALKPHOS 64  BILITOT 0.3  PROT 6.3  ALBUMIN 3.3*    CBC:  Recent Labs Lab 06/12/13 2122 06/13/13 0505  WBC 7.6 10.4  HGB 8.6* 9.2*  HCT 27.9* 31.5*  MCV 77.1* 77.8*  PLT 262 337    Cardiac Enzymes:  Recent Labs Lab 06/12/13 2123 06/13/13 0505  TROPONINI <0.30 <0.30    Coagulation:  Recent Labs Lab 06/12/13 2122 06/13/13 0505 06/14/13 0550  INR 1.90* 1.80* 1.75*     Medications:   Scheduled Medications: . arformoterol  15 mcg Nebulization Q12H  . aspirin EC  81 mg Oral Daily  . budesonide  0.5 mg Nebulization BID  . digoxin  0.125 mg Oral Daily  . feeding supplement (RESOURCE BREEZE)  1 Container Oral TID BM  . losartan  12.5 mg Oral Daily  . megestrol  400 mg Oral Daily  . metoprolol succinate  25 mg Oral  Daily  . montelukast  10 mg Oral QHS  . pantoprazole  40 mg Oral Daily  . potassium chloride  10 mEq Oral Daily  . predniSONE  2.5 mg Oral Daily  . simvastatin  40 mg Oral QHS  . sodium chloride  3 mL Intravenous Q12H  . sotalol  160 mg Oral Q12H  . spironolactone  25 mg Oral Daily  . Warfarin - Pharmacist Dosing Inpatient   Does not apply q1800     PRN Medications: sodium chloride, acetaminophen, albuterol, nitroGLYCERIN, ondansetron (ZOFRAN) IV, sodium chloride   Assessment:   1. Status post ICD shock for VT. See full interrogation from yesterday. Sotalol increased after review with Dr. Johney Frame. QTc stable. Potassium and magnesium normal range.  2. Ischemic cardiomyopathy, LVEF 30-35%.  3. History of recurrent VT status post ICD and VT ablation (April). He is on Sotalol and beta blocker.  4. Atrial fibrillation, on Coumadin, rate controlled at this time.  5. Mitral regurgitation, at least moderate. No active heart failure symptoms.   Plan/Discussion:    Continue current regimen. EP to see AM. BMET, ECG and magnesium AM.   Jonelle Sidle, M.D., F.A.C.C.

## 2013-06-14 NOTE — Progress Notes (Signed)
ANTICOAGULATION CONSULT NOTE - Follow-up Consult  Pharmacy Consult for Coumadin Indication: atrial fibrillation  No Known Allergies  Patient Measurements: Height: 5\' 9"  (175.3 cm) Weight: 143 lb 4.8 oz (65 kg) IBW/kg (Calculated) : 70.7  Vital Signs: Temp: 97.8 F (36.6 C) (11/09 1335) Temp src: Oral (11/09 1335) BP: 103/48 mmHg (11/09 1335) Pulse Rate: 79 (11/09 1335)  Labs:  Recent Labs  06/12/13 2122 06/12/13 2123 06/13/13 0505 06/13/13 1650 06/14/13 0550  HGB 8.6*  --  9.2*  --   --   HCT 27.9*  --  31.5*  --   --   PLT 262  --  337  --   --   LABPROT 21.2*  --  20.4*  --  19.9*  INR 1.90*  --  1.80*  --  1.75*  CREATININE 0.79  --  0.78 0.73 0.78  TROPONINI  --  <0.30 <0.30  --   --     Estimated Creatinine Clearance: 66.6 ml/min (by C-G formula based on Cr of 0.78).  Assessment: 79 y.o. male on coumadin PTA for afib (home dose 2.5mg  daily). INR subtherapeutic and trending down to 1.75 today even with increased dose yesterday. Appears dose missed 11/7. No bleeding noted.  Goal of Therapy:  INR 2-3 Monitor platelets by anticoagulation protocol: Yes   Plan:  1) Coumadin 4mg  po again tonight 2) Daily INR  Christoper Fabian, PharmD, BCPS Clinical pharmacist, pager [phone] 06/14/2013,3:20 PM

## 2013-06-14 NOTE — Progress Notes (Signed)
Monitor was showing QTC ranging from 490-511; EKG obtained: 463; will continue to monitor.  BARNETT, Geroge Baseman

## 2013-06-15 DIAGNOSIS — I2510 Atherosclerotic heart disease of native coronary artery without angina pectoris: Secondary | ICD-10-CM

## 2013-06-15 DIAGNOSIS — Z7901 Long term (current) use of anticoagulants: Secondary | ICD-10-CM

## 2013-06-15 DIAGNOSIS — Z951 Presence of aortocoronary bypass graft: Secondary | ICD-10-CM

## 2013-06-15 DIAGNOSIS — I519 Heart disease, unspecified: Secondary | ICD-10-CM

## 2013-06-15 LAB — PROTIME-INR
INR: 2.14 — ABNORMAL HIGH (ref 0.00–1.49)
Prothrombin Time: 23.2 seconds — ABNORMAL HIGH (ref 11.6–15.2)

## 2013-06-15 LAB — MAGNESIUM: Magnesium: 1.8 mg/dL (ref 1.5–2.5)

## 2013-06-15 LAB — BASIC METABOLIC PANEL
BUN: 7 mg/dL (ref 6–23)
CO2: 24 mEq/L (ref 19–32)
Chloride: 101 mEq/L (ref 96–112)
Creatinine, Ser: 0.7 mg/dL (ref 0.50–1.35)
GFR calc Af Amer: >90 mL/min (ref >90)
Sodium: 135 mEq/L (ref 135–145)

## 2013-06-15 MED ORDER — WARFARIN SODIUM 5 MG PO TABS: 2.5000 mg | ORAL_TABLET | Freq: Every day | ORAL | Status: DC

## 2013-06-15 MED ORDER — WARFARIN SODIUM 2.5 MG PO TABS
2.5000 mg | ORAL_TABLET | Freq: Once | ORAL | Status: DC
  Filled 2013-06-15: qty 1

## 2013-06-15 MED ORDER — SOTALOL HCL 160 MG PO TABS: 160.0000 mg | ORAL_TABLET | Freq: Two times a day (BID) | ORAL | Status: DC

## 2013-06-15 MED ORDER — POTASSIUM CHLORIDE ER 10 MEQ PO TBCR: 10.0000 meq | EXTENDED_RELEASE_TABLET | Freq: Every day | ORAL | Status: DC

## 2013-06-15 NOTE — Progress Notes (Signed)
Discharge instructions along with med list and scripts provided. IV d/c'd with catheter intact.  Patient transported via wheelchair to lobby for d/c home. Belongings with patient.[name]

## 2013-06-15 NOTE — Discharge Summary (Signed)
CARDIOLOGY DISCHARGE SUMMARY   Patient ID: [name] MRN: [id number] DOB/AGE: 1928-09-10 79 y.o.  Admit date: 06/12/2013 Discharge date: 06/15/2013  Primary Discharge Diagnosis:     Sustained VT (ventricular tachycardia) Secondary Discharge Diagnosis:    DM   HYPERTENSION   ICD-St.Jude   Coronary artery disease   Protein-calorie malnutrition, severe   Chronic anticoagulation - coumadin    Atrial fibrillation  Procedures:  Two-view chest x-ray, ICD interrogation  Hospital Course: [name] is a 79 y.o. male with a history of CAD. He had recently had diarrhea but was otherwise in his usual state of health on 06/12/2013 when his ICD fired. He came to the emergency room where his device was interrogated and he was admitted for further evaluation and treatment.  The device had fired appropriately for ventricular tachycardia after ATP had failed. His underlying rhythm was atrial fibrillation with a controlled ventricular rate. His digoxin level was therapeutic. His weight was at baseline and his respiratory status was stable.  His device was formally interrogated and Dr. Johney Frame reviewed all data by phone.  His conclusions:  Full interrogation of ICD shows 4 new episodes since last interrogation on 03/30/2013.  On 05/31/2013 - monomorphic VT at 285 msec, ATP x1 unsuccessful, accelerates to VT-2, successfully terminated 25j shock x1  On 06/11/2013 - monomorphic VT at 345 msec and 315 msec, both succesfully terminated with ATP  On 06/12/2013 (admission) - monomorphic VT, ATP x1 unsuccessful, accelerates to VT-2, successfully terminated 25j shock x1  All appropriate ICD therapies.  ECG shows QTc ~490 msec. Will increase sotalol dose to 160 mg twice daily. Monitor QTc and BMET and Mg daily. Will also up-titrate metoprolol dose as BP allows. If no recurrent VT, plan for DC home on Monday.   His sotalol was increased and he tolerated this well. Consideration was given to  increasing his metoprolol but he had not tolerated this in the past and will remain on his home dose. His labs were followed and his potassium required some supplementation. His home dose will be increased slightly. His magnesium level was within normal limits, ranging from the 1.8-2.2. His QTC was followed and did not show any significant increase. His Coumadin was slightly subtherapeutic on admission and was followed by pharmacy. By discharge she was therapeutic and is to follow up with the Coumadin clinic as an outpatient.  On 06/15/2013, [name] was seen by Dr. Johney Frame. He was having no more episodes of VT. He was tolerating the increased sotalol dosage well. Dr. Johney Frame evaluated [name] and reviewed all data. [name] is considered stable for discharge, to follow up in [place] as scheduled.   Labs:  Lab Results  Component Value Date   WBC 10.4 06/13/2013   HGB 9.2* 06/13/2013   HCT 31.5* 06/13/2013   MCV 77.8* 06/13/2013   PLT 337 06/13/2013    Recent Labs Lab 06/12/13 2122  06/15/13 1009  NA 136  < > 135  K 3.6  < > 3.6  CL 100  < > 101  CO2 24  < > 24  BUN 9  < > 7  CREATININE 0.79  < > 0.70  CALCIUM 9.0  < > 9.0  PROT 6.3  --   --   BILITOT 0.3  --   --   ALKPHOS 64  --   --   ALT 8  --   --   AST 13  --   --  GLUCOSE 122*  < > 101*  < > = values in this interval not displayed.  Recent Labs  06/12/13 2123 06/13/13 0505  TROPONINI <0.30 <0.30   Lab Results  Component Value Date   INR 2.14* 06/15/2013   INR 1.75* 06/14/2013   INR 1.80* 06/13/2013   INR 1.90* 06/12/2013   Lab Results  Component Value Date   DIGOXIN 0.8 06/13/2013   Magnesium  Date Value Range Status  06/15/2013 1.8  1.5 - 2.5 mg/dL Final     Radiology: Dg Chest 2 View 06/12/2013   CLINICAL DATA:  Problem of cardiac pacer  EXAM: CHEST  2 VIEW  COMPARISON:  02/09/2013  FINDINGS: Heart size within normal limits. Status post CABG. Dual lead cardiac pacer unchanged when compared to prior study. No  infiltrate or effusion. Hyperinflation consistent with COPD is stable.  IMPRESSION: No acute finding   Electronically Signed   By: Esperanza Heir M.D.   On: 06/12/2013 21:13   EKG: 06/15/2013 Atrial pacing, ventricular sensing Vent. rate 79 BPM PR interval 294 ms QRS duration 128 ms QT/QTc 404/463 ms P-R-T axes * 67 -87   FOLLOW UP PLANS AND APPOINTMENTS No Known Allergies   Medication List         acetaminophen 500 MG tablet  Commonly known as:  TYLENOL  Take 1,000 mg by mouth 2 (two) times daily as needed (pain).     albuterol 108 (90 BASE) MCG/ACT inhaler  Commonly known as:  PROVENTIL HFA;VENTOLIN HFA  Inhale 2 puffs into the lungs every 6 (six) hours as needed for wheezing or shortness of breath.     budesonide 0.5 MG/2ML nebulizer solution  Commonly known as:  PULMICORT  Take 0.5 mg by nebulization 2 (two) times daily.     digoxin 0.125 MG tablet  Commonly known as:  LANOXIN  Take 1 tablet (0.125 mg total) by mouth daily.     formoterol 20 MCG/2ML nebulizer solution  Commonly known as:  PERFOROMIST  Take 20 mcg by nebulization 2 (two) times daily.     furosemide 40 MG tablet  Commonly known as:  LASIX  Take 1 tablet (40 mg total) by mouth daily.     loratadine 10 MG tablet  Commonly known as:  CLARITIN  Take 10 mg by mouth daily. For allergies     losartan 25 MG tablet  Commonly known as:  COZAAR  Take 0.5 tablets (12.5 mg total) by mouth daily.     megestrol 40 MG/ML suspension  Commonly known as:  MEGACE  Take 400 mg by mouth daily. 10 mls (2 teaspoonsful)     metoprolol succinate 25 MG 24 hr tablet  Commonly known as:  TOPROL-XL  Take 1 tablet (25 mg total) by mouth daily.     montelukast 10 MG tablet  Commonly known as:  SINGULAIR  Take 10 mg by mouth at bedtime.     nitroGLYCERIN 0.4 MG SL tablet  Commonly known as:  NITROSTAT  Place 1 tablet (0.4 mg total) under the tongue every 5 (five) minutes as needed for chest pain.     omeprazole 20  MG capsule  Commonly known as:  PRILOSEC  Take 20 mg by mouth daily.     potassium chloride 10 MEQ tablet  Commonly known as:  K-DUR  Take 1 tablet (10 mEq total) by mouth daily. Take 2 tablets, total 20 mEq on Monday, Wednesday and Friday.     predniSONE 5 MG tablet  Commonly known as:  DELTASONE  Take 2.5 mg by mouth daily.     simvastatin 40 MG tablet  Commonly known as:  ZOCOR  Take 40 mg by mouth at bedtime.     sotalol 160 MG tablet  Commonly known as:  BETAPACE  Take 1 tablet (160 mg total) by mouth 2 (two) times daily.     spironolactone 25 MG tablet  Commonly known as:  ALDACTONE  Take 25 mg by mouth daily.     warfarin 5 MG tablet  Commonly known as:  COUMADIN  Take 0.5 tablets (2.5 mg total) by mouth at bedtime. On Wednesdays, take a whole tablet (5 mg).        Discharge Orders   Future Appointments Provider Department Dept Phone   07/07/2013 10:50 AM [facility] [phone]   07/07/2013 11:00 AM Laqueta Linden, MD [facility] [phone]   07/09/2013 11:00 AM Hillis Range, MD [facility] [phone]   Future Orders Complete By Expires   Diet - low sodium heart healthy  As directed    Increase activity slowly  As directed      Follow-up Information   Follow up with [facility] On 07/07/2013. (Coumadin check at 10:50 AM)       Follow up with Laqueta Linden, MD On 07/07/2013. (At 11 AM)    Specialty:  Cardiology   Contact information:   [address] [place] [address] [phone]       Follow up with Hillis Range, MD On 07/09/2013. (See in the [place] office at 11 AM)    Specialty:  Cardiology   Contact information:   [address] [place] [address] [phone]       BRING ALL MEDICATIONS WITH YOU TO FOLLOW UP APPOINTMENTS  Time spent with patient to include physician time: 36 min Signed: Theodore Demark, PA-C 06/15/2013, 11:39 AM Co-Sign MD  Hillis Range MD

## 2013-06-15 NOTE — Progress Notes (Signed)
ANTICOAGULATION CONSULT NOTE - Follow Up Consult  Pharmacy Consult for Coumadin Indication: atrial fibrillation  No Known Allergies  Patient Measurements: Height: 5\' 9"  (175.3 cm) Weight: 143 lb 4.8 oz (65 kg) IBW/kg (Calculated) : 70.7  Vital Signs: Temp: 98.4 F (36.9 C) (11/10 0441) Temp src: Oral (11/10 0441) BP: 95/59 mmHg (11/10 0441) Pulse Rate: 79 (11/10 0441)  Labs:  Recent Labs  06/12/13 2122 06/12/13 2123 06/13/13 0505 06/13/13 1650 06/14/13 0550 06/15/13 0718  HGB 8.6*  --  9.2*  --   --   --   HCT 27.9*  --  31.5*  --   --   --   PLT 262  --  337  --   --   --   LABPROT 21.2*  --  20.4*  --  19.9* 23.2*  INR 1.90*  --  1.80*  --  1.75* 2.14*  CREATININE 0.79  --  0.78 0.73 0.78  --   TROPONINI  --  <0.30 <0.30  --   --   --     Estimated Creatinine Clearance: 66.6 ml/min (by C-G formula based on Cr of 0.78).  Assessment: 83yom continues on coumadin for afib. INR is now therapeutic at 2.14 after two higher doses of 4mg  given. Will resume home dose today. No bleeding reported.  Goal of Therapy:  INR 2-3 Monitor platelets by anticoagulation protocol: Yes   Plan:  1) Coumadin 2.5mg  x 1 2) INR in AM if patient still here  Fredrik Rigger 06/15/2013,8:51 AM

## 2013-06-15 NOTE — Progress Notes (Signed)
Primary cardiologist: Dr. Prentice Docker   Subjective:    Events are reviewed.  He is admitted for VT with CL 300 msec.  He has done very well since his ablation in April.  With increase in sotalol on Saturday, his rhythm has become quiescent.  Today he denies chest pain, palpitations, or device shocks.  His SOB is at its baseline.  Objective:    Physical Exam: Filed Vitals:   06/14/13 1335 06/14/13 1949 06/14/13 2106 06/15/13 0441  BP: 103/48 109/62  95/59  Pulse: 79 77  79  Temp: 97.8 F (36.6 C) 98.5 F (36.9 C)  98.4 F (36.9 C)  TempSrc: Oral Oral  Oral  Resp: 18 19  20   Height:      Weight:      SpO2: 99% 97% 97% 97%    GEN- The patient is elderly appearing, alert and oriented x 3 today.   Head- normocephalic, atraumatic Eyes-  Sclera clear, conjunctiva pink Ears- hearing intact Oropharynx- clear Neck- supple  Lungs- Clear to ausculation bilaterally, normal work of breathing Chest- ICD pocket is well healed Heart- Regular rate and rhythm  GI- soft, NT, ND, + BS Extremities- no clubbing, cyanosis, or edema MS-age appropriate muscle atrophy  ICD interrogation is reviewed ekgs are reviewed Epic chart is reviewed  Basic Metabolic Panel:  Recent Labs Lab 06/12/13 2122 06/13/13 0505 06/13/13 1650 06/14/13 0550  NA 136 135 130* 139  K 3.6 3.4* 3.8 4.1  CL 100 99 95* 105  CO2 24 22 23 23   GLUCOSE 122* 102* 105* 99  BUN 9 9 7 6   CREATININE 0.79 0.78 0.73 0.78  CALCIUM 9.0 9.0 9.2 9.3  MG 1.6  --   --  2.2    Liver Function Tests:  Recent Labs Lab 06/12/13 2122  AST 13  ALT 8  ALKPHOS 64  BILITOT 0.3  PROT 6.3  ALBUMIN 3.3*    CBC:  Recent Labs Lab 06/12/13 2122 06/13/13 0505  WBC 7.6 10.4  HGB 8.6* 9.2*  HCT 27.9* 31.5*  MCV 77.1* 77.8*  PLT 262 337    Cardiac Enzymes:  Recent Labs Lab 06/12/13 2123 06/13/13 0505  TROPONINI <0.30 <0.30    Coagulation:  Recent Labs Lab 06/12/13 2122 06/13/13 0505 06/14/13 0550   INR 1.90* 1.80* 1.75*     Medications:   Scheduled Medications: . arformoterol  15 mcg Nebulization Q12H  . aspirin EC  81 mg Oral Daily  . budesonide  0.5 mg Nebulization BID  . digoxin  0.125 mg Oral Daily  . feeding supplement (RESOURCE BREEZE)  1 Container Oral TID BM  . losartan  12.5 mg Oral Daily  . megestrol  400 mg Oral Daily  . metoprolol succinate  25 mg Oral Daily  . montelukast  10 mg Oral QHS  . pantoprazole  40 mg Oral Daily  . potassium chloride  10 mEq Oral Daily  . predniSONE  2.5 mg Oral Daily  . simvastatin  40 mg Oral QHS  . sodium chloride  3 mL Intravenous Q12H  . sotalol  160 mg Oral Q12H  . spironolactone  25 mg Oral Daily  . Warfarin - Pharmacist Dosing Inpatient   Does not apply q1800     PRN Medications: sodium chloride, acetaminophen, albuterol, nitroGLYCERIN, ondansetron (ZOFRAN) IV, sodium chloride   Assessment:   1. VT. Improved with sotalol 160mg  BID.  Qtc is stable No driving x 6 months (pt is aware) Continue other medicines including  metoprolol.  Further up titration of metoprolol is limited by hypotension.  2. Ischemic cardiomyopathy, LVEF 30-35%.  3. Atrial fibrillation, on Coumadin, rate controlled at this time. Will need close INR follow-up as he is subtherapeutic today As he has not had a prior stroke and is in sinus rhythm, I do not feel that bridging is warranted presently  4. Mitral regurgitation, at least moderate. No active heart failure symptoms.   Plan/Discussion:    Continue current regimen.  Awaiting results of labs from today.   If labs are OK, discharge to home. Outpatient bmet, Mg and EKG in 1 week Keep follow-up with Dr Purvis Sheffield and me as currently scheduled.

## 2013-06-21 ENCOUNTER — Inpatient Hospital Stay (HOSPITAL_COMMUNITY)
Admission: EM | Admit: 2013-06-21 | Discharge: 2013-06-26 | DRG: 250 | Disposition: A | Discharge status: Home Health | Payer: Medicare Other | Attending: Internal Medicine | Admitting: Internal Medicine

## 2013-06-21 ENCOUNTER — Emergency Department (HOSPITAL_COMMUNITY): Payer: Medicare Other

## 2013-06-21 ENCOUNTER — Encounter (HOSPITAL_COMMUNITY): Payer: Self-pay | Admitting: Emergency Medicine

## 2013-06-21 DIAGNOSIS — J9601 Acute respiratory failure with hypoxia: Secondary | ICD-10-CM

## 2013-06-21 DIAGNOSIS — I5042 Chronic combined systolic (congestive) and diastolic (congestive) heart failure: Secondary | ICD-10-CM

## 2013-06-21 DIAGNOSIS — I5022 Chronic systolic (congestive) heart failure: Secondary | ICD-10-CM | POA: Diagnosis present

## 2013-06-21 DIAGNOSIS — E119 Type 2 diabetes mellitus without complications: Secondary | ICD-10-CM

## 2013-06-21 DIAGNOSIS — Z8589 Personal history of malignant neoplasm of other organs and systems: Secondary | ICD-10-CM

## 2013-06-21 DIAGNOSIS — E785 Hyperlipidemia, unspecified: Secondary | ICD-10-CM

## 2013-06-21 DIAGNOSIS — IMO0002 Reserved for concepts with insufficient information to code with codable children: Secondary | ICD-10-CM

## 2013-06-21 DIAGNOSIS — I2510 Atherosclerotic heart disease of native coronary artery without angina pectoris: Secondary | ICD-10-CM

## 2013-06-21 DIAGNOSIS — I059 Rheumatic mitral valve disease, unspecified: Secondary | ICD-10-CM | POA: Diagnosis present

## 2013-06-21 DIAGNOSIS — R5381 Other malaise: Secondary | ICD-10-CM | POA: Diagnosis present

## 2013-06-21 DIAGNOSIS — I359 Nonrheumatic aortic valve disorder, unspecified: Secondary | ICD-10-CM | POA: Diagnosis present

## 2013-06-21 DIAGNOSIS — E43 Unspecified severe protein-calorie malnutrition: Secondary | ICD-10-CM

## 2013-06-21 DIAGNOSIS — I38 Endocarditis, valve unspecified: Secondary | ICD-10-CM

## 2013-06-21 DIAGNOSIS — I2589 Other forms of chronic ischemic heart disease: Secondary | ICD-10-CM

## 2013-06-21 DIAGNOSIS — E876 Hypokalemia: Secondary | ICD-10-CM | POA: Diagnosis not present

## 2013-06-21 DIAGNOSIS — D649 Anemia, unspecified: Secondary | ICD-10-CM

## 2013-06-21 DIAGNOSIS — R0989 Other specified symptoms and signs involving the circulatory and respiratory systems: Secondary | ICD-10-CM

## 2013-06-21 DIAGNOSIS — I4891 Unspecified atrial fibrillation: Secondary | ICD-10-CM

## 2013-06-21 DIAGNOSIS — I472 Ventricular tachycardia, unspecified: Principal | ICD-10-CM

## 2013-06-21 DIAGNOSIS — I509 Heart failure, unspecified: Secondary | ICD-10-CM | POA: Diagnosis present

## 2013-06-21 DIAGNOSIS — I10 Essential (primary) hypertension: Secondary | ICD-10-CM

## 2013-06-21 DIAGNOSIS — I951 Orthostatic hypotension: Secondary | ICD-10-CM

## 2013-06-21 DIAGNOSIS — R079 Chest pain, unspecified: Secondary | ICD-10-CM

## 2013-06-21 DIAGNOSIS — Z95810 Presence of automatic (implantable) cardiac defibrillator: Secondary | ICD-10-CM

## 2013-06-21 DIAGNOSIS — I252 Old myocardial infarction: Secondary | ICD-10-CM

## 2013-06-21 DIAGNOSIS — I779 Disorder of arteries and arterioles, unspecified: Secondary | ICD-10-CM

## 2013-06-21 DIAGNOSIS — D509 Iron deficiency anemia, unspecified: Secondary | ICD-10-CM | POA: Diagnosis present

## 2013-06-21 DIAGNOSIS — J449 Chronic obstructive pulmonary disease, unspecified: Secondary | ICD-10-CM | POA: Diagnosis present

## 2013-06-21 DIAGNOSIS — Z79899 Other long term (current) drug therapy: Secondary | ICD-10-CM

## 2013-06-21 DIAGNOSIS — R042 Hemoptysis: Secondary | ICD-10-CM

## 2013-06-21 DIAGNOSIS — K5900 Constipation, unspecified: Secondary | ICD-10-CM

## 2013-06-21 DIAGNOSIS — I4729 Other ventricular tachycardia: Principal | ICD-10-CM | POA: Diagnosis present

## 2013-06-21 DIAGNOSIS — Z951 Presence of aortocoronary bypass graft: Secondary | ICD-10-CM

## 2013-06-21 DIAGNOSIS — Z4502 Encounter for adjustment and management of automatic implantable cardiac defibrillator: Secondary | ICD-10-CM

## 2013-06-21 DIAGNOSIS — J189 Pneumonia, unspecified organism: Secondary | ICD-10-CM

## 2013-06-21 DIAGNOSIS — Z7901 Long term (current) use of anticoagulants: Secondary | ICD-10-CM

## 2013-06-21 DIAGNOSIS — Z87891 Personal history of nicotine dependence: Secondary | ICD-10-CM

## 2013-06-21 DIAGNOSIS — Z8249 Family history of ischemic heart disease and other diseases of the circulatory system: Secondary | ICD-10-CM

## 2013-06-21 DIAGNOSIS — J4489 Other specified chronic obstructive pulmonary disease: Secondary | ICD-10-CM | POA: Diagnosis present

## 2013-06-21 DIAGNOSIS — I519 Heart disease, unspecified: Secondary | ICD-10-CM

## 2013-06-21 DIAGNOSIS — I214 Non-ST elevation (NSTEMI) myocardial infarction: Secondary | ICD-10-CM

## 2013-06-21 DIAGNOSIS — E41 Nutritional marasmus: Secondary | ICD-10-CM | POA: Diagnosis present

## 2013-06-21 LAB — CBC WITH DIFFERENTIAL/PLATELET
Basophils Absolute: 0.1 10*3/uL (ref 0.0–0.1)
Basophils Relative: 1 % (ref 0–1)
HCT: 30.2 % — ABNORMAL LOW (ref 39.0–52.0)
Hemoglobin: 9.1 g/dL — ABNORMAL LOW (ref 13.0–17.0)
Lymphocytes Relative: 7 % — ABNORMAL LOW (ref 12–46)
MCHC: 30.1 g/dL (ref 30.0–36.0)
Monocytes Absolute: 0.8 10*3/uL (ref 0.1–1.0)
Monocytes Relative: 9 % (ref 3–12)
Neutro Abs: 7.8 10*3/uL — ABNORMAL HIGH (ref 1.7–7.7)
Neutrophils Relative %: 83 % — ABNORMAL HIGH (ref 43–77)
RDW: 16.6 % — ABNORMAL HIGH (ref 11.5–15.5)
WBC: 9.4 10*3/uL (ref 4.0–10.5)

## 2013-06-21 LAB — MAGNESIUM: Magnesium: 1.6 mg/dL (ref 1.5–2.5)

## 2013-06-21 LAB — BASIC METABOLIC PANEL
CO2: 23 mEq/L (ref 19–32)
Chloride: 98 mEq/L (ref 96–112)
GFR calc Af Amer: >90 mL/min (ref >90)
GFR calc non Af Amer: 81 mL/min — ABNORMAL LOW (ref >90)
Potassium: 3.5 mEq/L (ref 3.5–5.1)

## 2013-06-21 LAB — PROTIME-INR: Prothrombin Time: 22.8 seconds — ABNORMAL HIGH (ref 11.6–15.2)

## 2013-06-21 LAB — POCT I-STAT TROPONIN I: Troponin i, poc: 0.08 ng/mL (ref 0.00–0.08)

## 2013-06-21 MED ORDER — SPIRONOLACTONE 25 MG PO TABS
25.0000 mg | ORAL_TABLET | Freq: Every day | ORAL | Status: DC
  Administered 2013-06-22: 10:00:00 25 mg via ORAL
  Filled 2013-06-21 (×3): qty 1

## 2013-06-21 MED ORDER — BUDESONIDE 0.5 MG/2ML IN SUSP
0.5000 mg | Freq: Two times a day (BID) | RESPIRATORY_TRACT | Status: DC
Start: 2013-06-21 — End: 2013-06-26
  Administered 2013-06-21 – 2013-06-26 (×9): 0.5 mg via RESPIRATORY_TRACT
  Filled 2013-06-21 (×13): qty 2

## 2013-06-21 MED ORDER — NITROGLYCERIN 0.4 MG SL SUBL: 0.4000 mg | SUBLINGUAL_TABLET | SUBLINGUAL | Status: DC | PRN

## 2013-06-21 MED ORDER — MEXILETINE HCL 200 MG PO CAPS
200.0000 mg | ORAL_CAPSULE | Freq: Three times a day (TID) | ORAL | Status: DC
  Administered 2013-06-21 – 2013-06-22 (×3): 200 mg via ORAL
  Filled 2013-06-21 (×6): qty 1

## 2013-06-21 MED ORDER — WARFARIN SODIUM 2.5 MG PO TABS
2.5000 mg | ORAL_TABLET | ORAL | Status: DC
  Administered 2013-06-21: 2.5 mg via ORAL
  Filled 2013-06-21 (×2): qty 1

## 2013-06-21 MED ORDER — ARFORMOTEROL TARTRATE 15 MCG/2ML IN NEBU
15.0000 ug | INHALATION_SOLUTION | Freq: Two times a day (BID) | RESPIRATORY_TRACT | Status: DC
  Administered 2013-06-21 – 2013-06-26 (×9): 15 ug via RESPIRATORY_TRACT
  Filled 2013-06-21 (×14): qty 2

## 2013-06-21 MED ORDER — WARFARIN SODIUM 5 MG PO TABS: 5.0000 mg | ORAL_TABLET | ORAL | Status: DC

## 2013-06-21 MED ORDER — LORATADINE 10 MG PO TABS
10.0000 mg | ORAL_TABLET | Freq: Every day | ORAL | Status: DC
  Administered 2013-06-22 – 2013-06-26 (×4): 10 mg via ORAL
  Filled 2013-06-21 (×6): qty 1

## 2013-06-21 MED ORDER — POTASSIUM CHLORIDE ER 10 MEQ PO TBCR
10.0000 meq | EXTENDED_RELEASE_TABLET | Freq: Every day | ORAL | Status: DC
  Administered 2013-06-21 – 2013-06-26 (×5): 10 meq via ORAL
  Filled 2013-06-21 (×6): qty 1

## 2013-06-21 MED ORDER — DIGOXIN 125 MCG PO TABS
0.1250 mg | ORAL_TABLET | Freq: Every day | ORAL | Status: DC
  Administered 2013-06-22 – 2013-06-26 (×4): 0.125 mg via ORAL
  Filled 2013-06-21 (×5): qty 1

## 2013-06-21 MED ORDER — SOTALOL HCL 80 MG PO TABS
160.0000 mg | ORAL_TABLET | Freq: Two times a day (BID) | ORAL | Status: DC
  Administered 2013-06-21 – 2013-06-26 (×8): 160 mg via ORAL
  Filled 2013-06-21 (×12): qty 2

## 2013-06-21 MED ORDER — MONTELUKAST SODIUM 10 MG PO TABS
10.0000 mg | ORAL_TABLET | Freq: Every day | ORAL | Status: DC
  Administered 2013-06-21 – 2013-06-25 (×5): 10 mg via ORAL
  Filled 2013-06-21 (×8): qty 1

## 2013-06-21 MED ORDER — ALBUTEROL SULFATE HFA 108 (90 BASE) MCG/ACT IN AERS
2.0000 | INHALATION_SPRAY | Freq: Four times a day (QID) | RESPIRATORY_TRACT | Status: DC | PRN
  Filled 2013-06-21: qty 6.7

## 2013-06-21 MED ORDER — SODIUM CHLORIDE 0.9 % IJ SOLN
3.0000 mL | Freq: Two times a day (BID) | INTRAMUSCULAR | Status: DC
  Administered 2013-06-21 – 2013-06-25 (×7): 3 mL via INTRAVENOUS

## 2013-06-21 MED ORDER — WARFARIN SODIUM 2.5 MG PO TABS: 2.5000 mg | ORAL_TABLET | Freq: Every day | ORAL | Status: DC

## 2013-06-21 MED ORDER — SIMVASTATIN 40 MG PO TABS
40.0000 mg | ORAL_TABLET | Freq: Every day | ORAL | Status: DC
  Administered 2013-06-21 – 2013-06-25 (×5): 40 mg via ORAL
  Filled 2013-06-21 (×7): qty 1

## 2013-06-21 MED ORDER — METOPROLOL SUCCINATE ER 25 MG PO TB24
25.0000 mg | ORAL_TABLET | Freq: Every day | ORAL | Status: DC
  Administered 2013-06-22 – 2013-06-26 (×4): 25 mg via ORAL
  Filled 2013-06-21 (×5): qty 1

## 2013-06-21 MED ORDER — WARFARIN - PHYSICIAN DOSING INPATIENT: Freq: Every day | Status: DC

## 2013-06-21 MED ORDER — PREDNISONE 2.5 MG PO TABS
2.5000 mg | ORAL_TABLET | Freq: Every day | ORAL | Status: DC
  Administered 2013-06-22 – 2013-06-26 (×4): 2.5 mg via ORAL
  Filled 2013-06-21 (×5): qty 1

## 2013-06-21 MED ORDER — LOSARTAN POTASSIUM 25 MG PO TABS
12.5000 mg | ORAL_TABLET | Freq: Every day | ORAL | Status: DC
  Administered 2013-06-22: 12.5 mg via ORAL
  Administered 2013-06-24: 11:00:00 via ORAL
  Administered 2013-06-25 – 2013-06-26 (×2): 12.5 mg via ORAL
  Filled 2013-06-21 (×5): qty 0.5

## 2013-06-21 MED ORDER — PANTOPRAZOLE SODIUM 40 MG PO TBEC
40.0000 mg | DELAYED_RELEASE_TABLET | Freq: Every day | ORAL | Status: DC
  Administered 2013-06-22 – 2013-06-26 (×4): 40 mg via ORAL
  Filled 2013-06-21 (×4): qty 1

## 2013-06-21 MED ORDER — FUROSEMIDE 40 MG PO TABS
40.0000 mg | ORAL_TABLET | Freq: Every day | ORAL | Status: DC
  Administered 2013-06-22 – 2013-06-26 (×4): 40 mg via ORAL
  Filled 2013-06-21 (×6): qty 1

## 2013-06-21 MED ORDER — ACETAMINOPHEN 500 MG PO TABS: 1000.0000 mg | ORAL_TABLET | Freq: Two times a day (BID) | ORAL | Status: DC | PRN

## 2013-06-21 MED ORDER — MEGESTROL ACETATE 40 MG/ML PO SUSP
400.0000 mg | Freq: Every day | ORAL | Status: DC
  Administered 2013-06-22 – 2013-06-26 (×4): 400 mg via ORAL
  Filled 2013-06-21 (×6): qty 10

## 2013-06-21 NOTE — Progress Notes (Signed)
PHARMACIST - PHYSICIAN COMMUNICATION DR:  Bensimhon CONCERNING: Pharmacy Care Issues Regarding Warfarin Labs  RECOMMENDATION (Action Taken): A baseline and daily protime for three days has been ordered to meet the [facility] National Patient safety goal and comply with the current [facility] policy.   The Pharmacy will defer all warfarin dose order changes and follow up of lab results to the prescriber unless an additional order to initiate a "pharmacy Coumadin consult" is placed.  DESCRIPTION:  While hospitalized, to be in compliance with The [employer] National Patient Safety Goals, all patients on warfarin must have a baseline and/or current protime prior to the administration of warfarin. Pharmacy has received your order for warfarin without these required laboratory assessments.  Link Snuffer, PharmD, BCPS Clinical Pharmacist [phone] 06/21/2013, 4:15 PM

## 2013-06-21 NOTE — ED Notes (Signed)
Pt returned from xray

## 2013-06-21 NOTE — ED Notes (Signed)
Phlebotomy at the bedside  

## 2013-06-21 NOTE — ED Notes (Addendum)
Report delayed to the floor due to Tai, RN requesting to have Rapid Response to evaluate patient for appropriateness to the floor.

## 2013-06-21 NOTE — ED Notes (Signed)
Pt presents via EMS after having 4 episodes (2 yesterday and 2 todays) of pacemaker defibrillation.  Pt denies chest pains.  EMS BP 120/67 to 98/60, HR 82 to 87 and O2 of 99% to 96%.  EMS rhythm of LLB with PVC and Front Bundle Branch Block.  Pt is on Coumadin at home.

## 2013-06-21 NOTE — H&P (Addendum)
History and Physical  Patient ID: TRITON HEIDRICH MRN: [id number], SOB: 08/05/1929 79 y.o. Date of Encounter: 06/21/2013, 2:03 PM  Primary Physician: Louie Boston, MD Primary Cardiologist: Lavonia Dana  Chief Complaint: ICD firing  HPI: Mr. Rosenzweig is an 79 y.o. male w/ PMHx significant for CAD s/p CABG, iCM (EF 30-35% with moderate to severe MR on echo 5/14), DM2, paroxysmal afib, VT s/p ICD, s/p ablation who was recently discharged from [facility] on 11/10 after being admitted for recurrent VT with multiple ICD discharges.    His device was formally interrogated and Dr. Johney Frame and confirmed VT which was unsuccessfully treated with ATP and resulted in shocks.  His sotalol was increased and he tolerated this well. Consideration was given to increasing his metoprolol but he had not tolerated this in the past so this was not changed.   Since going home says he has had 4 ICD discharges at rest with near syncope. Denies preceding angina. Has chronic dyspnea without change. No orthopnea, PND or LE edema.  Device interrogated by [facility]. Jude rep at bedside. Confirms 2 episodes of VT treated with shocks - 1 yesterday and 1 today.  Past Medical History  Diagnosis Date  . Hypertension   . Coronary artery disease     a. CABG 03/2006. b. NSTEMI 03/2012 following VT likely demand ischemia - grafts patent at cath.  . Diabetes mellitus   . Other specified forms of chronic ischemic heart disease   . Syncope and collapse   . Automatic implantable cardiac defibrillator in situ     QRS duration 120 ms no typical left bundle branch block.  . Atrial fibrillation     Taking sotalol, intolerant to amiodarone  . Valvular heart disease     Mild to moderate mitral valve insufficiency and mild aortic insufficiency  . Chronic airway obstruction, not elsewhere classified   . Old myocardial infarction   . Other and unspecified hyperlipidemia   . Congestive heart failure, unspecified     a. Ischemic cardiomyopathy  ejection fraction 30-35% previously. b. Down to 15% by cath 03/2012.  [name] Cerebrovascular disease, unspecified   . Other mechanical complication of other internal orthopedic device, implant, and graft   . Carotid artery disease     s/p LCEA 2007.  [name] Head and neck cancer     Receiving radiation therapy.  . Orthostatic hypotension   . Ventricular tachycardia     a. S/p ICD implantation. b. Recurrent in 03/2012 with medication adjustment.;  c. s/p RFCA 11/2012 (Dr. Fawn Kirk)      Surgical History:  Past Surgical History  Procedure Laterality Date  . Repeat otif left knee with figure-of-eight tension band.    . Implantation of a dual-chamber implantable  06/18/2007  . Left carotid endarterectomy    . Coronary artery bypass graft  04/04/2006  . Vt ablation  11/06/12    Ischemic VT ablation by Dr Johney Frame     Home Meds: Prior to Admission medications   Medication Sig Start Date End Date Taking? Authorizing Provider  acetaminophen (TYLENOL) 500 MG tablet Take 1,000 mg by mouth 2 (two) times daily as needed (pain).   Yes Historical Provider, MD  albuterol (PROVENTIL HFA;VENTOLIN HFA) 108 (90 BASE) MCG/ACT inhaler Inhale 2 puffs into the lungs every 6 (six) hours as needed for wheezing or shortness of breath.   Yes Historical Provider, MD  budesonide (PULMICORT) 0.5 MG/2ML nebulizer solution Take 0.5 mg by nebulization 2 (two) times daily.   Yes Historical Provider, MD  digoxin (LANOXIN) 0.125 MG tablet Take 1 tablet (0.125 mg total) by mouth daily. 01/28/13  Yes Richarda Overlie, MD  formoterol (PERFOROMIST) 20 MCG/2ML nebulizer solution Take 20 mcg by nebulization 2 (two) times daily.   Yes Historical Provider, MD  furosemide (LASIX) 40 MG tablet Take 1 tablet (40 mg total) by mouth daily. 03/30/13  Yes Hillis Range, MD  loratadine (CLARITIN) 10 MG tablet Take 10 mg by mouth daily. For allergies   Yes Historical Provider, MD  losartan (COZAAR) 25 MG tablet Take 0.5 tablets (12.5 mg total) by mouth daily. 01/28/13   Yes Richarda Overlie, MD  megestrol (MEGACE) 40 MG/ML suspension Take 400 mg by mouth daily. 10 mls (2 teaspoonsful) 10/23/12  Yes Rollene Rotunda, MD  metoprolol succinate (TOPROL-XL) 25 MG 24 hr tablet Take 1 tablet (25 mg total) by mouth daily. 03/30/13  Yes Hillis Range, MD  montelukast (SINGULAIR) 10 MG tablet Take 10 mg by mouth at bedtime.  05/22/13  Yes Historical Provider, MD  nitroGLYCERIN (NITROSTAT) 0.4 MG SL tablet Place 1 tablet (0.4 mg total) under the tongue every 5 (five) minutes as needed for chest pain. 11/26/11  Yes June Leap, MD  omeprazole (PRILOSEC) 20 MG capsule Take 20 mg by mouth daily.   Yes Historical Provider, MD  potassium chloride (K-DUR) 10 MEQ tablet Take 10 mEq by mouth daily.  01/22/13  Yes Historical Provider, MD  predniSONE (DELTASONE) 5 MG tablet Take 2.5 mg by mouth daily.  08/28/11  Yes June Leap, MD  simvastatin (ZOCOR) 40 MG tablet Take 40 mg by mouth at bedtime.    Yes Historical Provider, MD  sotalol (BETAPACE) 120 MG tablet Take 1 tablet (120 mg total) by mouth 2 (two) times daily. 12/25/12  Yes Hillis Range, MD  spironolactone (ALDACTONE) 25 MG tablet Take 25 mg by mouth daily.   Yes Historical Provider, MD  warfarin (COUMADIN) 5 MG tablet Take 2.5 mg by mouth at bedtime.   Yes Historical Provider, MD    Allergies: No Known Allergies  History   Social History  . Marital Status: Married    Spouse Name: N/A    Number of Children: N/A  . Years of Education: N/A   Occupational History  . RETIRED    Social History Main Topics  . Smoking status: Former Smoker -- 2.00 packs/day for 60 years    Types: Cigarettes    Quit date: 08/06/1990  . Smokeless tobacco: Never Used  . Alcohol Use: No  . Drug Use: No  . Sexual Activity: Yes   Other Topics Concern  . Not on file   Social History Narrative   Pt does not get regular exercise.     Family History:  Patient reports significant family history of heart disease and hypertension. No h/o  familial cardiomyopathy.   Review of Systems: General: negative for chills, fever, night sweats or weight changes.  Cardiovascular: see HPI. Dermatological: negative for rash Respiratory: negative for cough or wheezing Urologic: negative for hematuria Abdominal: negative for nausea, vomiting, , bright red blood per rectum, melena, or hematemesis. Neurologic: negative for visual changes, +pre syncope,  All other systems reviewed and are otherwise negative except as noted above.  Labs:   Lab Results  Component Value Date   WBC 9.4 06/21/2013   HGB 9.1* 06/21/2013   HCT 30.2* 06/21/2013   MCV 77.4* 06/21/2013   PLT 344 06/21/2013     Recent Labs Lab 06/21/13 1222  NA 135  K  3.5  CL 98  CO2 23  BUN 8  CREATININE 0.81  CALCIUM 9.5  GLUCOSE 111*   No results found for this basename: CKTOTAL, CKMB, TROPONINI,  in the last 72 hours Lab Results  Component Value Date   CHOL 134 03/06/2012   HDL 32* 03/06/2012   LDLCALC 67 03/06/2012   TRIG 176* 03/06/2012   No results found for this basename: DDIMER    Radiology/Studies:  Dg Chest 2 View  06/12/2013   CLINICAL DATA:  Problem of cardiac pacer  EXAM: CHEST  2 VIEW  COMPARISON:  02/09/2013  FINDINGS: Heart size within normal limits. Status post CABG. Dual lead cardiac pacer unchanged when compared to prior study. No infiltrate or effusion. Hyperinflation consistent with COPD is stable.  IMPRESSION: No acute finding   Electronically Signed   By: Esperanza Heir M.D.   On: 06/12/2013 21:13     EKG: Intermittent A pacing. LBBB   Physical Exam: Blood pressure 102/52, pulse 76, resp. rate 14, height 5\' 9"  (1.753 m), weight 64.864 kg (143 lb), SpO2 99.00%. General:  in no acute distress. Frail appearing Head: Normocephalic, atraumatic, sclera non-icteric, nares are without discharge Neck: Supple. Negative for carotid bruits. JVD not elevated. Lungs: poor airmovment, no wheezing Heart: RRR with S1 S2. No murmurs, rubs, or gallops  appreciated. Abdomen: Soft, non-tender, non-distended with normoactive bowel sounds. No rebound/guarding. No obvious abdominal masses. Msk:  Strength and tone appear normal for age. Extremities: No edema. No clubbing or cyanosis. Distal pedal pulses are 2+ and equal bilaterally. Missing partial digits on right hand. Neuro: Alert and oriented X 3. Moves all extremities spontaneously. Psych:  Responds to questions appropriately with a normal affect.   ASSESSMENT:  1. Recurrent VT with ICD discharges 2. Ischemic CM EF 30-35% 3. CAD s/p CABG - last cath 2013 with grafts patent 4. Paroxysmal afib 5. Hypokalemia  6. Microcytic anemia - check iron stores and stool guaiac  ASSESSMENT AND PLAN:   Case d/w Dr. Johney Frame by telephone. Will bring in for observation. Start mexilitene 200 q8. Supp K+. Mag level pending. Continue coumadin for AF.   Migdalia Dk MD 06/21/2013, 2:02 PM

## 2013-06-21 NOTE — ED Notes (Signed)
ICD interrogation completed. Report given to this RN from Dr. Silverio Lay to show cardiology on arrival.

## 2013-06-21 NOTE — ED Provider Notes (Signed)
CSN: [id number]     Arrival date & time 06/21/13  1207 History   First MD Initiated Contact with Patient 06/21/13 1207     Chief Complaint  Patient presents with  . AICD Problem   (Consider location/radiation/quality/duration/timing/severity/associated sxs/prior Treatment) The history is provided by the patient.  [name] is a 79 y.o. male hx of HTN, CAD, DM, Vtach with defibrillator here with defibrillator firing. He was recently admitted for ventricular tachycardia causing his defibrillator to fire. He was discharged several days ago and since then were shocked several times. He was shocked twice yesterday as well as twice today. It started with some shortness of breath and chest discomfort then he was shocked. He's been taking his medicines as prescribed and has been followed by Dr. Johney Frame.    Past Medical History  Diagnosis Date  . Hypertension   . Coronary artery disease     a. CABG 03/2006. b. NSTEMI 03/2012 following VT likely demand ischemia - grafts patent at cath.  . Diabetes mellitus   . Other specified forms of chronic ischemic heart disease   . Syncope and collapse   . Automatic implantable cardiac defibrillator in situ     QRS duration 120 ms no typical left bundle branch block.  . Atrial fibrillation     Taking sotalol, intolerant to amiodarone  . Valvular heart disease     Mild to moderate mitral valve insufficiency and mild aortic insufficiency  . Chronic airway obstruction, not elsewhere classified   . Old myocardial infarction   . Other and unspecified hyperlipidemia   . Congestive heart failure, unspecified     a. Ischemic cardiomyopathy ejection fraction 30-35% previously. b. Down to 15% by cath 03/2012.  [name] Cerebrovascular disease, unspecified   . Other mechanical complication of other internal orthopedic device, implant, and graft   . Carotid artery disease     s/p LCEA 2007.  [name] Head and neck cancer     Receiving radiation therapy.  . Orthostatic  hypotension   . Ventricular tachycardia     a. S/p ICD implantation. b. Recurrent in 03/2012 with medication adjustment.;  c. s/p RFCA 11/2012 (Dr. Fawn Kirk)    Past Surgical History  Procedure Laterality Date  . Repeat otif left knee with figure-of-eight tension band.    . Implantation of a dual-chamber implantable  06/18/2007  . Left carotid endarterectomy    . Coronary artery bypass graft  04/04/2006  . Vt ablation  11/06/12    Ischemic VT ablation by Dr Johney Frame   History reviewed. No pertinent family history. History  Substance Use Topics  . Smoking status: Former Smoker -- 2.00 packs/day for 60 years    Types: Cigarettes    Quit date: 08/06/1990  . Smokeless tobacco: Never Used  . Alcohol Use: No    Review of Systems  Respiratory: Positive for shortness of breath.   Cardiovascular: Positive for chest pain.  All other systems reviewed and are negative.    Allergies  Review of patient's allergies indicates no known allergies.  Home Medications   Current Outpatient Rx  Name  Route  Sig  Dispense  Refill  . acetaminophen (TYLENOL) 500 MG tablet   Oral   Take 1,000 mg by mouth 2 (two) times daily as needed (pain).         [name] albuterol (PROVENTIL HFA;VENTOLIN HFA) 108 (90 BASE) MCG/ACT inhaler   Inhalation   Inhale 2 puffs into the lungs every 6 (six) hours as needed for wheezing  or shortness of breath.         . budesonide (PULMICORT) 0.5 MG/2ML nebulizer solution   Nebulization   Take 0.5 mg by nebulization 2 (two) times daily.         . digoxin (LANOXIN) 0.125 MG tablet   Oral   Take 1 tablet (0.125 mg total) by mouth daily.   30 tablet   2   . formoterol (PERFOROMIST) 20 MCG/2ML nebulizer solution   Nebulization   Take 20 mcg by nebulization 2 (two) times daily.         . furosemide (LASIX) 40 MG tablet   Oral   Take 1 tablet (40 mg total) by mouth daily.           Decreased on 03/30/2013.   [name] loratadine (CLARITIN) 10 MG tablet   Oral   Take 10 mg by  mouth daily. For allergies         . losartan (COZAAR) 25 MG tablet   Oral   Take 0.5 tablets (12.5 mg total) by mouth daily.   30 tablet   0   . megestrol (MEGACE) 40 MG/ML suspension   Oral   Take 400 mg by mouth daily. 10 mls (2 teaspoonsful)         . metoprolol succinate (TOPROL-XL) 25 MG 24 hr tablet   Oral   Take 1 tablet (25 mg total) by mouth daily.   30 tablet   6     Dose decreased 03/30/2013   . montelukast (SINGULAIR) 10 MG tablet   Oral   Take 10 mg by mouth at bedtime.          . nitroGLYCERIN (NITROSTAT) 0.4 MG SL tablet   Sublingual   Place 1 tablet (0.4 mg total) under the tongue every 5 (five) minutes as needed for chest pain.   25 each   3   . omeprazole (PRILOSEC) 20 MG capsule   Oral   Take 20 mg by mouth daily.         . potassium chloride (K-DUR) 10 MEQ tablet   Oral   Take 1 tablet (10 mEq total) by mouth daily. Take 2 tablets, total 20 mEq on Monday, Wednesday and Friday.   45 tablet   11   . predniSONE (DELTASONE) 5 MG tablet   Oral   Take 2.5 mg by mouth daily.          . simvastatin (ZOCOR) 40 MG tablet   Oral   Take 40 mg by mouth at bedtime.          . sotalol (BETAPACE) 160 MG tablet   Oral   Take 1 tablet (160 mg total) by mouth 2 (two) times daily.   60 tablet   6     Dose decreased 12/25/2012   . spironolactone (ALDACTONE) 25 MG tablet   Oral   Take 25 mg by mouth daily.         [name] warfarin (COUMADIN) 5 MG tablet   Oral   Take 0.5 tablets (2.5 mg total) by mouth at bedtime. On Wednesdays, take a whole tablet (5 mg).   40 tablet   11    BP 104/47  Pulse 74  Resp 18  Ht 5\' 9"  (1.753 m)  Wt 143 lb (64.864 kg)  BMI 21.11 kg/m2  SpO2 96% Physical Exam  Nursing note and vitals reviewed. Constitutional: He is oriented to person, place, and time.  Chronically ill, slightly uncomfortable   HENT:  Head: Normocephalic.  Mouth/Throat: Oropharynx is clear and moist.  Eyes: Conjunctivae are normal. Pupils  are equal, round, and reactive to light.  Neck: Normal range of motion. Neck supple.  Cardiovascular: Normal rate, regular rhythm and normal heart sounds.   Pulmonary/Chest: Effort normal and breath sounds normal. No respiratory distress. He has no wheezes. He has no rales.  Abdominal: Soft. Bowel sounds are normal. He exhibits no distension. There is no tenderness. There is no rebound.  Musculoskeletal: Normal range of motion.  Neurological: He is alert and oriented to person, place, and time.  Skin: Skin is warm and dry.  Psychiatric: He has a normal mood and affect. His behavior is normal. Judgment and thought content normal.    ED Course  Procedures (including critical care time) Labs Review Labs Reviewed  CBC WITH DIFFERENTIAL - Abnormal; Notable for the following:    RBC 3.90 (*)    Hemoglobin 9.1 (*)    HCT 30.2 (*)    MCV 77.4 (*)    MCH 23.3 (*)    RDW 16.6 (*)    Neutrophils Relative % 83 (*)    Neutro Abs 7.8 (*)    Lymphocytes Relative 7 (*)    All other components within normal limits  BASIC METABOLIC PANEL - Abnormal; Notable for the following:    Glucose, Bld 111 (*)    GFR calc non Af Amer 81 (*)    All other components within normal limits  PROTIME-INR - Abnormal; Notable for the following:    Prothrombin Time 22.8 (*)    INR 2.09 (*)    All other components within normal limits  MAGNESIUM  POCT I-STAT TROPONIN I   Imaging Review Dg Chest 2 View  06/21/2013   CLINICAL DATA:  Defibrillator malfunction. CABG. Hypertension. Chest pain.  EXAM: CHEST  2 VIEW  COMPARISON:  06/12/2013  FINDINGS: Mild hyperinflation. Prior median sternotomy. Osteopenia. Mild lower thoracic vertebral body height loss. This is unchanged.  Pacer/AICD device. Leads at right atrium and right ventricle. No lead discontinuity. Midline trachea. Biapical pleural thickening. Mild cardiomegaly with atherosclerosis in the transverse aorta. No pleural effusion or pneumothorax. Interstitial  thickening. Suspected at least 1 right upper lobe calcified granuloma.  IMPRESSION: 1. No evidence of pacer discontinuity. 2. COPD/chronic bronchitis and cardiomegaly. No acute superimposed process.   Electronically Signed   By: Jeronimo Greaves M.D.   On: 06/21/2013 13:02    EKG Interpretation     Ventricular Rate:  77 PR Interval:  66 QRS Duration: 146 QT Interval:  451 QTC Calculation: 510 R Axis:   38 Text Interpretation:  Atrial-paced complexes Left bundle branch block No significant change since last tracing            MDM   1. V-tach   2. Defibrillator discharge    [name] is a 79 y.o. male here with chest pain, defibrillator firing. I am concerned that he had V tach again. Will interrogate defibrillator. Will call cards for consult.   12:30 PM I called Dr. Gala Romney to evaluate patient. Pacemaker interrogated and showed several runs of V tach and patient was shocked once yesterday and once today. Cards will start mexiletine and will admit.     Richardean Canal, MD 06/21/13 [phone]

## 2013-06-22 DIAGNOSIS — Z95810 Presence of automatic (implantable) cardiac defibrillator: Secondary | ICD-10-CM

## 2013-06-22 DIAGNOSIS — Z0389 Encounter for observation for other suspected diseases and conditions ruled out: Secondary | ICD-10-CM

## 2013-06-22 DIAGNOSIS — I519 Heart disease, unspecified: Secondary | ICD-10-CM

## 2013-06-22 DIAGNOSIS — I2510 Atherosclerotic heart disease of native coronary artery without angina pectoris: Secondary | ICD-10-CM

## 2013-06-22 LAB — PROTIME-INR
INR: 2.22 — ABNORMAL HIGH (ref 0.00–1.49)
Prothrombin Time: 23.9 seconds — ABNORMAL HIGH (ref 11.6–15.2)

## 2013-06-22 LAB — BASIC METABOLIC PANEL
BUN: 9 mg/dL (ref 6–23)
CO2: 25 mEq/L (ref 19–32)
Chloride: 95 mEq/L — ABNORMAL LOW (ref 96–112)
Creatinine, Ser: 0.81 mg/dL (ref 0.50–1.35)
Glucose, Bld: 87 mg/dL (ref 70–99)

## 2013-06-22 LAB — IRON AND TIBC
Saturation Ratios: 4 % — ABNORMAL LOW (ref 20–55)
UIBC: 337 ug/dL (ref 125–400)

## 2013-06-22 MED ORDER — ALPRAZOLAM 0.25 MG PO TABS: 0.5000 mg | ORAL_TABLET | Freq: Once | ORAL | Status: DC

## 2013-06-22 MED ORDER — SODIUM CHLORIDE 0.9 % IV SOLN
INTRAVENOUS | Status: DC
  Administered 2013-06-23: 05:00:00 via INTRAVENOUS

## 2013-06-22 NOTE — Progress Notes (Signed)
UR completed.  Patient changed to inpatient status r/t ablation scheduled for 06/23/13.

## 2013-06-22 NOTE — Progress Notes (Signed)
INITIAL NUTRITION ASSESSMENT  DOCUMENTATION CODES Per approved criteria  -Severe malnutrition in the context of chronic illness   INTERVENTION: Pt refusing all supplements at this time. Encouraged high-protein foods. RD to continue to follow nutrition care plan.  NUTRITION DIAGNOSIS: Inadequate oral intake r/t poor appetite AEB pt report.  Goal: Pt to meet >/= 90% of their estimated nutrition needs   Monitor:  PO intake, supplement acceptance, weight trend, labs   Reason for Assessment:  Malnutrition Screen Tool  79 y.o. male  Admitting Dx: Paroxysmal ventricular tachycardia  ASSESSMENT: Pt admitted after his ICD fired. Pt was admitted about a week and a half ago for the same thing. Was seen by RD during that visit and reported poor appetite x 2 years, has been on Megace for about the same time frame and feels it has helped. Pt has lost an additional 5 lb since his last admission.  Pt has had 14% weight loss in 9 months. Potassium is currently low, pt ordered for KCl. Pt states that he doesn't like Ensure supplements. Was ordered for Resource Breeze during previous hospitalization but didn't like it. Ate 100% of breakfast and lunch per his report.  Nutrition Focused Physical Exam:  Subcutaneous Fat:  Orbital Region: WNL Upper Arm Region: mild-moderate wasting  Thoracic and Lumbar Region: severe wasting  Muscle:  Temple Region: severe wasting Clavicle Bone Region: severe wasting Clavicle and Acromion Bone Region: severe wasting Scapular Bone Region: severe wasting Dorsal Hand: mild-moderate wasting Patellar Region: mild-moderate wasting Anterior Thigh Region: mild-moderate wasting Posterior Calf Region: mild-moderate wasting  Edema: not present  Pt meets criteria for severe MALNUTRITION in the context of chronic illness as evidenced by severe fat and muscle mass loss.  Height: Ht Readings from Last 1 Encounters:  06/21/13 5\' 9"  (1.753 m)    Weight: Wt Readings  from Last 1 Encounters:  06/22/13 140 lb 8 oz (63.73 kg)    Ideal Body Weight: 72.7 kg   % Ideal Body Weight: 88%  Wt Readings from Last 10 Encounters:  06/22/13 140 lb 8 oz (63.73 kg)  06/14/13 143 lb 4.8 oz (65 kg)  04/03/13 144 lb (65.318 kg)  03/30/13 148 lb (67.132 kg)  01/28/13 144 lb (65.318 kg)  12/25/12 149 lb 12.8 oz (67.949 kg)  12/24/12 151 lb 12.8 oz (68.856 kg)  11/08/12 149 lb 11.1 oz (67.9 kg)  11/08/12 149 lb 11.1 oz (67.9 kg)  10/22/12 154 lb (69.854 kg)    Usual Body Weight: 162 lb   % Usual Body Weight: 86%  BMI:  Body mass index is 20.74 kg/(m^2). Normal  Estimated Nutritional Needs: Kcal: 1800-2000 Protein: 80-90 grams Fluid: > 1.8 L/day  Skin: no issues noted  Diet Order: Cardiac  EDUCATION NEEDS: -Education needs addressed   Intake/Output Summary (Last 24 hours) at 06/22/13 1401 Last data filed at 06/22/13 1310  Gross per 24 hour  Intake   1560 ml  Output    600 ml  Net    960 ml    Last BM: 11/17  Labs:   Recent Labs Lab 06/21/13 1222 06/21/13 1705 06/22/13 0607  NA 135  --  132*  K 3.5  --  3.4*  CL 98  --  95*  CO2 23  --  25  BUN 8  --  9  CREATININE 0.81  --  0.81  CALCIUM 9.5  --  9.2  MG  --  1.6 1.6  GLUCOSE 111*  --  87  CBG (last 3)  No results found for this basename: GLUCAP,  in the last 72 hours  Scheduled Meds: . arformoterol  15 mcg Nebulization Q12H  . budesonide  0.5 mg Nebulization BID  . digoxin  0.125 mg Oral Daily  . furosemide  40 mg Oral Daily  . loratadine  10 mg Oral Daily  . losartan  12.5 mg Oral Daily  . megestrol  400 mg Oral Daily  . metoprolol succinate  25 mg Oral Daily  . montelukast  10 mg Oral QHS  . pantoprazole  40 mg Oral Daily  . potassium chloride  10 mEq Oral Daily  . predniSONE  2.5 mg Oral Daily  . simvastatin  40 mg Oral QHS  . sodium chloride  3 mL Intravenous Q12H  . sotalol  160 mg Oral BID  . spironolactone  25 mg Oral Daily    Continuous Infusions:    Past Medical History  Diagnosis Date  . Hypertension   . Coronary artery disease     a. CABG 03/2006. b. NSTEMI 03/2012 following VT likely demand ischemia - grafts patent at cath.  . Diabetes mellitus   . Other specified forms of chronic ischemic heart disease   . Syncope and collapse   . Automatic implantable cardiac defibrillator in situ     QRS duration 120 ms no typical left bundle branch block.  . Atrial fibrillation     Taking sotalol, intolerant to amiodarone  . Valvular heart disease     Mild to moderate mitral valve insufficiency and mild aortic insufficiency  . Chronic airway obstruction, not elsewhere classified   . Old myocardial infarction   . Other and unspecified hyperlipidemia   . Congestive heart failure, unspecified     a. Ischemic cardiomyopathy ejection fraction 30-35% previously. b. Down to 15% by cath 03/2012.  [name] Cerebrovascular disease, unspecified   . Other mechanical complication of other internal orthopedic device, implant, and graft   . Carotid artery disease     s/p LCEA 2007.  [name] Head and neck cancer     Receiving radiation therapy.  . Orthostatic hypotension   . Ventricular tachycardia     a. S/p ICD implantation. b. Recurrent in 03/2012 with medication adjustment.;  c. s/p RFCA 11/2012 (Dr. Fawn Kirk)     Past Surgical History  Procedure Laterality Date  . Repeat otif left knee with figure-of-eight tension band.    . Implantation of a dual-chamber implantable  06/18/2007  . Left carotid endarterectomy    . Coronary artery bypass graft  04/04/2006  . Vt ablation  11/06/12    Ischemic VT ablation by Dr Johney Frame    Jarold Motto MS, RD, LDN Pager: [phone] After-hours pager: [phone]

## 2013-06-22 NOTE — Consult Note (Signed)
ELECTROPHYSIOLOGY CONSULT NOTE    Patient ID: BRANCH PACITTI MRN: [id number], DOB/AGE: May 28, 1929 79 y.o.  Admit date: 06/21/2013 Date of Consult: 06-22-2013  Primary Physician: Louie Boston, MD Primary Cardiologist: Prentice Docker, MD  Reason for Consultation: VT  HPI:  [name] is a 79 year old male with a past medical history significant for CAD s/p CABG, ICM (EF 30-35% with moderate to severe MR on echo 5/14), DM2, paroxysmal afib, VT s/p ICD (dual chamber STJ), s/p VT ablation 11-2010 who was recently discharged from [facility] on 11/10 after being admitted for recurrent VT with multiple ICD discharges.  At that time, his sotalol dose was increased and he was discharged to home.  He was in his usual state of health until yesterday when he received 4 ICD shocks and came to the ER for further evaluation.  Device interrogation demonstrates VT-1 episode 11-16 at 11:10AM, ventricular cycle length 330-360 msec, 9 failed ATP with conversion to SR with 25J shock; VT-2 episode 11-16 at 10:34AM, ventricular cycle length ~32msec, 1 failed ATP with 25J shock; VT-1 episode 11-15 at 5:59PM, ventricular cycle length ~349msec, 5 failed ATP with 25J shock; VT-1 episode 11-15 at 5:37PM, ventricular cycle length ~361msec, 5 failed ATP, final ATP terminated VT. There are two distinct VT morphologies.  All episodes that have onset recorded begin with a VP.   He denies chest pain, worsening shortness of breath, nausea, vomiting, diarrhea.  He states he has been compliant with his medications.    Lab work is notable for therapeutic INR, K 3.5, Hgb 9.1 Mg 1.6.  Last echo 01-01-13 demonstrated EF 30-35%, akinesis and scarring of inferlateral, inferior, and apical myocardium; akinesis of distal anteroseptal myocardium; mild AR; moderate MR; LA 54.  Last cath 03-2012 demonstrated ischemic CM with severe global hypokinesis; patent IMA to the LAD; patent SVG to the OM; patent SVG to PDA with some mild insertion narrowing.     EP has been asked to evaluate for treatment options.  ROS is negative except as outlined above.    Past Medical History  Diagnosis Date  . Hypertension   . Coronary artery disease     a. CABG 03/2006. b. NSTEMI 03/2012 following VT likely demand ischemia - grafts patent at cath.  . Diabetes mellitus   . Other specified forms of chronic ischemic heart disease   . Syncope and collapse   . Automatic implantable cardiac defibrillator in situ     QRS duration 120 ms no typical left bundle branch block.  . Atrial fibrillation     Taking sotalol, intolerant to amiodarone  . Valvular heart disease     Mild to moderate mitral valve insufficiency and mild aortic insufficiency  . Chronic airway obstruction, not elsewhere classified   . Old myocardial infarction   . Other and unspecified hyperlipidemia   . Congestive heart failure, unspecified     a. Ischemic cardiomyopathy ejection fraction 30-35% previously. b. Down to 15% by cath 03/2012.  [name] Cerebrovascular disease, unspecified   . Other mechanical complication of other internal orthopedic device, implant, and graft   . Carotid artery disease     s/p LCEA 2007.  [name] Head and neck cancer     Receiving radiation therapy.  . Orthostatic hypotension   . Ventricular tachycardia     a. S/p ICD implantation. b. Recurrent in 03/2012 with medication adjustment.;  c. s/p RFCA 11/2012 (Dr. Fawn Kirk)      Surgical History:  Past Surgical History  Procedure Laterality Date  .  Repeat otif left knee with figure-of-eight tension band.    . Implantation of a dual-chamber implantable  06/18/2007  . Left carotid endarterectomy    . Coronary artery bypass graft  04/04/2006  . Vt ablation  11/06/12    Ischemic VT ablation by Dr Johney Frame     Prescriptions prior to admission  Medication Sig Dispense Refill  . acetaminophen (TYLENOL) 500 MG tablet Take 1,000 mg by mouth 2 (two) times daily as needed (pain).      [name] albuterol (PROVENTIL HFA;VENTOLIN HFA) 108 (90  BASE) MCG/ACT inhaler Inhale 2 puffs into the lungs every 6 (six) hours as needed for wheezing or shortness of breath.      . budesonide (PULMICORT) 0.5 MG/2ML nebulizer solution Take 0.5 mg by nebulization 2 (two) times daily.      . digoxin (LANOXIN) 0.125 MG tablet Take 1 tablet (0.125 mg total) by mouth daily.  30 tablet  2  . formoterol (PERFOROMIST) 20 MCG/2ML nebulizer solution Take 20 mcg by nebulization 2 (two) times daily.      . furosemide (LASIX) 40 MG tablet Take 1 tablet (40 mg total) by mouth daily.      [name] loratadine (CLARITIN) 10 MG tablet Take 10 mg by mouth daily. For allergies      . losartan (COZAAR) 25 MG tablet Take 0.5 tablets (12.5 mg total) by mouth daily.  30 tablet  0  . megestrol (MEGACE) 40 MG/ML suspension Take 400 mg by mouth daily. 10 mls (2 teaspoonsful)      . metoprolol succinate (TOPROL-XL) 25 MG 24 hr tablet Take 1 tablet (25 mg total) by mouth daily.  30 tablet  6  . montelukast (SINGULAIR) 10 MG tablet Take 10 mg by mouth at bedtime.       . nitroGLYCERIN (NITROSTAT) 0.4 MG SL tablet Place 1 tablet (0.4 mg total) under the tongue every 5 (five) minutes as needed for chest pain.  25 each  3  . omeprazole (PRILOSEC) 20 MG capsule Take 20 mg by mouth daily.      . potassium chloride (K-DUR) 10 MEQ tablet Take 1 tablet (10 mEq total) by mouth daily. Take 2 tablets, total 20 mEq on Monday, Wednesday and Friday.  45 tablet  11  . predniSONE (DELTASONE) 5 MG tablet Take 2.5 mg by mouth daily.       . simvastatin (ZOCOR) 40 MG tablet Take 40 mg by mouth at bedtime.       . sotalol (BETAPACE) 160 MG tablet Take 1 tablet (160 mg total) by mouth 2 (two) times daily.  60 tablet  6  . spironolactone (ALDACTONE) 25 MG tablet Take 25 mg by mouth daily.      [name] warfarin (COUMADIN) 5 MG tablet Take 0.5 tablets (2.5 mg total) by mouth at bedtime. On Wednesdays, take a whole tablet (5 mg).  40 tablet  11    Inpatient Medications:  . arformoterol  15 mcg Nebulization Q12H  .  budesonide  0.5 mg Nebulization BID  . digoxin  0.125 mg Oral Daily  . furosemide  40 mg Oral Daily  . loratadine  10 mg Oral Daily  . losartan  12.5 mg Oral Daily  . megestrol  400 mg Oral Daily  . metoprolol succinate  25 mg Oral Daily  . mexiletine  200 mg Oral Q8H  . montelukast  10 mg Oral QHS  . pantoprazole  40 mg Oral Daily  . potassium chloride  10 mEq Oral Daily  .  predniSONE  2.5 mg Oral Daily  . simvastatin  40 mg Oral QHS  . sodium chloride  3 mL Intravenous Q12H  . sotalol  160 mg Oral BID  . spironolactone  25 mg Oral Daily  . warfarin  2.5 mg Oral Custom  . [START ON 06/24/2013] warfarin  5 mg Oral Q Wed-1800  . Warfarin - Physician Dosing Inpatient   Does not apply q1800    Allergies: No Known Allergies  History   Social History  . Marital Status: Married    Spouse Name: N/A    Number of Children: N/A  . Years of Education: N/A   Occupational History  . RETIRED    Social History Main Topics  . Smoking status: Former Smoker -- 2.00 packs/day for 60 years    Types: Cigarettes    Quit date: 08/06/1990  . Smokeless tobacco: Never Used  . Alcohol Use: No  . Drug Use: No  . Sexual Activity: Yes   Other Topics Concern  . Not on file   Social History Narrative   Pt does not get regular exercise.     History reviewed. No pertinent family history.   Physical Exam: Filed Vitals:   06/21/13 2111 06/22/13 0100 06/22/13 0434 06/22/13 0626  BP:  103/55 112/57 108/52  Pulse:  76 74 76  Temp:  97.6 F (36.4 C) 97.8 F (36.6 C)   TempSrc:  Oral Oral   Resp:  18 18   Height:      Weight:   140 lb 8 oz (63.73 kg)   SpO2: 99%  100%     GEN- The patient is well appearing, alert and oriented x 3 today.   Head- normocephalic, atraumatic Eyes-  Sclera clear, conjunctiva pink Ears- hearing intact Oropharynx- clear Neck- supple,   Lungs- Clear to ausculation bilaterally, normal work of breathing Chest- ICD pocket is well healed Heart- Regular rate and  rhythm, no murmurs, rubs or gallops, PMI not laterally displaced GI- soft, NT, ND, + BS Extremities- no clubbing, cyanosis, or edema MS- diffuse muscle atrophy Skin- no rash or lesion Psych- euthymic mood, full affect Neuro- strength and sensation are intact  ICD interrogation- reviewed in detail today,  See paper chart  Labs:   Lab Results  Component Value Date   WBC 9.4 06/21/2013   HGB 9.1* 06/21/2013   HCT 30.2* 06/21/2013   MCV 77.4* 06/21/2013   PLT 344 06/21/2013    Recent Labs Lab 06/21/13 1222  NA 135  K 3.5  CL 98  CO2 23  BUN 8  CREATININE 0.81  CALCIUM 9.5  GLUCOSE 111*     Radiology/Studies: Dg Chest 2 View 06/21/2013   CLINICAL DATA:  Defibrillator malfunction. CABG. Hypertension. Chest pain.  EXAM: CHEST  2 VIEW  COMPARISON:  06/12/2013  FINDINGS: Mild hyperinflation. Prior median sternotomy. Osteopenia. Mild lower thoracic vertebral body height loss. This is unchanged.  Pacer/AICD device. Leads at right atrium and right ventricle. No lead discontinuity. Midline trachea. Biapical pleural thickening. Mild cardiomegaly with atherosclerosis in the transverse aorta. No pleural effusion or pneumothorax. Interstitial thickening. Suspected at least 1 right upper lobe calcified granuloma.  IMPRESSION: 1. No evidence of pacer discontinuity. 2. COPD/chronic bronchitis and cardiomegaly. No acute superimposed process.   Electronically Signed   By: Jeronimo Greaves M.D.   On: 06/21/2013 13:02   EKG: atrial pacing with intrinsic ventricular conduction, AP-VS interval , QTc  TELEMETRY: AP/VS, no further VT  Assessment and Plan:  1.  VT The patient has had recurrent VT despite medical therapy with sotalol.  He has had dizziness and blurred vision today with mexiletine.  I think that the best approach at this point is repeat catheter ablation. Therapeutic strategies for ventricular tachycardia including medicine and ablation were discussed in detail with the patient  today. Risk, benefits, and alternatives to EP study and radiofrequency ablation were also discussed in detail today. These risks include but are not limited to stroke, bleeding, vascular damage, tamponade, perforation, damage to the heart and other structures, AV block, worsening renal function, and death. The patient understands these risk and wishes to proceed.  We will therefore proceed with catheter ablation at the next available time. We will hold coumadin in the interim.  2. afib Hold coumadin for VT ablation Continue other medicines  3. Ischemic CM Stable volume  4. CAD No ischemic symptoms   NPO after midnight for ablation

## 2013-06-22 NOTE — Progress Notes (Signed)
Chart review complete.  Patient is not eligible for [facility] Care Management services because his/her PCP is not a [facility] primary care provider or is not [facility] affiliated.  For any additional questions or new referrals please contact Tim Henderson BSN RN [facility] Liaison at [phone] °

## 2013-06-23 ENCOUNTER — Encounter (HOSPITAL_COMMUNITY): Payer: Self-pay | Admitting: Certified Registered Nurse Anesthetist

## 2013-06-23 ENCOUNTER — Inpatient Hospital Stay (HOSPITAL_COMMUNITY): Payer: Medicare Other | Admitting: Anesthesiology

## 2013-06-23 ENCOUNTER — Encounter (HOSPITAL_COMMUNITY): Payer: Medicare Other | Admitting: Anesthesiology

## 2013-06-23 ENCOUNTER — Encounter (HOSPITAL_COMMUNITY)
Admission: EM | Disposition: A | Discharge status: Home Health | Payer: Self-pay | Source: Home / Self Care | Attending: Internal Medicine

## 2013-06-23 DIAGNOSIS — I472 Ventricular tachycardia: Secondary | ICD-10-CM

## 2013-06-23 HISTORY — PX: V-TACH ABLATION: SHX5498

## 2013-06-23 LAB — CBC
HCT: 26.7 % — ABNORMAL LOW (ref 39.0–52.0)
Hemoglobin: 8.2 g/dL — ABNORMAL LOW (ref 13.0–17.0)
Hemoglobin: 8.1 g/dL — ABNORMAL LOW (ref 13.0–17.0)
MCH: 23.4 pg — ABNORMAL LOW (ref 26.0–34.0)
MCH: 23.1 pg — ABNORMAL LOW (ref 26.0–34.0)
MCHC: 30.6 g/dL (ref 30.0–36.0)
MCHC: 30.3 g/dL (ref 30.0–36.0)
Platelets: 312 10*3/uL (ref 150–400)
RBC: 3.5 MIL/uL — ABNORMAL LOW (ref 4.22–5.81)

## 2013-06-23 LAB — GLUCOSE, CAPILLARY
Glucose-Capillary: 170 mg/dL — ABNORMAL HIGH (ref 70–99)
Glucose-Capillary: 101 mg/dL — ABNORMAL HIGH (ref 70–99)

## 2013-06-23 LAB — POCT ACTIVATED CLOTTING TIME
Activated Clotting Time: 273 seconds
Activated Clotting Time: 298 seconds
Activated Clotting Time: 273 seconds

## 2013-06-23 LAB — MRSA PCR SCREENING: MRSA by PCR: NEGATIVE

## 2013-06-23 LAB — PROTIME-INR: Prothrombin Time: 22.4 seconds — ABNORMAL HIGH (ref 11.6–15.2)

## 2013-06-23 SURGERY — V-TACH ABLATION
Anesthesia: Monitor Anesthesia Care

## 2013-06-23 MED ORDER — SODIUM CHLORIDE 0.9 % IV SOLN: 250.0000 mL | INTRAVENOUS | Status: DC | PRN

## 2013-06-23 MED ORDER — PROPOFOL INFUSION 10 MG/ML OPTIME
INTRAVENOUS | Status: DC | PRN
  Administered 2013-06-23: 25 ug/kg/min via INTRAVENOUS

## 2013-06-23 MED ORDER — ONDANSETRON HCL 4 MG/2ML IJ SOLN: 4.0000 mg | Freq: Once | INTRAMUSCULAR | Status: AC | PRN

## 2013-06-23 MED ORDER — FUROSEMIDE 10 MG/ML IJ SOLN
INTRAMUSCULAR | Status: AC
  Filled 2013-06-23: qty 8

## 2013-06-23 MED ORDER — OXYCODONE HCL 5 MG PO TABS: 5.0000 mg | ORAL_TABLET | Freq: Once | ORAL | Status: AC | PRN

## 2013-06-23 MED ORDER — SODIUM CHLORIDE 0.9 % IV SOLN
INTRAVENOUS | Status: DC | PRN
  Administered 2013-06-23: 08:00:00 via INTRAVENOUS

## 2013-06-23 MED ORDER — PROTAMINE SULFATE 10 MG/ML IV SOLN
INTRAVENOUS | Status: DC | PRN
  Administered 2013-06-23: 40 mg via INTRAVENOUS

## 2013-06-23 MED ORDER — MIDAZOLAM HCL 5 MG/5ML IJ SOLN
INTRAMUSCULAR | Status: DC | PRN
  Administered 2013-06-23: 1 mg via INTRAVENOUS

## 2013-06-23 MED ORDER — MEPERIDINE HCL 25 MG/ML IJ SOLN: 6.2500 mg | INTRAMUSCULAR | Status: DC | PRN

## 2013-06-23 MED ORDER — HYDROMORPHONE HCL PF 1 MG/ML IJ SOLN: 0.2500 mg | INTRAMUSCULAR | Status: DC | PRN

## 2013-06-23 MED ORDER — SODIUM CHLORIDE 0.9 % IJ SOLN: 3.0000 mL | INTRAMUSCULAR | Status: DC | PRN

## 2013-06-23 MED ORDER — SODIUM CHLORIDE 0.9 % IJ SOLN
3.0000 mL | Freq: Two times a day (BID) | INTRAMUSCULAR | Status: DC
  Administered 2013-06-23 – 2013-06-25 (×5): 3 mL via INTRAVENOUS

## 2013-06-23 MED ORDER — OXYCODONE HCL 5 MG/5ML PO SOLN: 5.0000 mg | Freq: Once | ORAL | Status: AC | PRN

## 2013-06-23 MED ORDER — PHENYLEPHRINE HCL 10 MG/ML IJ SOLN
INTRAMUSCULAR | Status: DC | PRN
  Administered 2013-06-23 (×4): 80 ug via INTRAVENOUS

## 2013-06-23 MED ORDER — ONDANSETRON HCL 4 MG/2ML IJ SOLN: 4.0000 mg | Freq: Four times a day (QID) | INTRAMUSCULAR | Status: DC | PRN

## 2013-06-23 MED ORDER — HEPARIN SODIUM (PORCINE) 1000 UNIT/ML IJ SOLN
INTRAMUSCULAR | Status: DC | PRN
  Administered 2013-06-23: 2000 [IU] via INTRAVENOUS
  Administered 2013-06-23: 3000 [IU] via INTRAVENOUS
  Administered 2013-06-23 (×2): 2000 [IU] via INTRAVENOUS
  Administered 2013-06-23: 7000 [IU] via INTRAVENOUS

## 2013-06-23 MED ORDER — FUROSEMIDE 10 MG/ML IJ SOLN: 60.0000 mg | Freq: Once | INTRAMUSCULAR | Status: DC

## 2013-06-23 MED ORDER — ACETAMINOPHEN 325 MG PO TABS: 650.0000 mg | ORAL_TABLET | ORAL | Status: DC | PRN

## 2013-06-23 MED ORDER — FENTANYL CITRATE 0.05 MG/ML IJ SOLN
INTRAMUSCULAR | Status: DC | PRN
  Administered 2013-06-23 (×3): 25 ug via INTRAVENOUS

## 2013-06-23 MED ORDER — BUPIVACAINE HCL (PF) 0.25 % IJ SOLN
INTRAMUSCULAR | Status: AC
  Filled 2013-06-23: qty 30

## 2013-06-23 MED ORDER — HEPARIN SODIUM (PORCINE) 1000 UNIT/ML IJ SOLN
INTRAMUSCULAR | Status: AC
  Filled 2013-06-23: qty 1

## 2013-06-23 MED ORDER — HYDROCODONE-ACETAMINOPHEN 5-325 MG PO TABS: 1.0000 | ORAL_TABLET | ORAL | Status: DC | PRN

## 2013-06-23 NOTE — Anesthesia Postprocedure Evaluation (Signed)
Anesthesia Post Note  Patient: [name]  Procedure(s) Performed: Procedure(s) (LRB): V-TACH ABLATION (N/A)  Anesthesia type: general  Patient location: PACU  Post pain: Pain level controlled  Post assessment: Patient's Cardiovascular Status Stable  Last Vitals:  Filed Vitals:   06/23/13 0558  BP: 109/49  Pulse: 76  Temp: 36.6 C  Resp: 19    Post vital signs: Reviewed and stable  Level of consciousness: sedated  Complications: No apparent anesthesia complications

## 2013-06-23 NOTE — Progress Notes (Signed)
 SUBJECTIVE: The patient is doing well today.  At this time, he denies chest pain, shortness of breath, or any new concerns. Pending VT ablation today.  States "dizziness" has improved.   CURRENT MEDICATIONS: . ALPRAZolam  0.5 mg Oral Once  . arformoterol  15 mcg Nebulization Q12H  . budesonide  0.5 mg Nebulization BID  . digoxin  0.125 mg Oral Daily  . furosemide  40 mg Oral Daily  . loratadine  10 mg Oral Daily  . losartan  12.5 mg Oral Daily  . megestrol  400 mg Oral Daily  . metoprolol succinate  25 mg Oral Daily  . montelukast  10 mg Oral QHS  . pantoprazole  40 mg Oral Daily  . potassium chloride  10 mEq Oral Daily  . predniSONE  2.5 mg Oral Daily  . simvastatin  40 mg Oral QHS  . sodium chloride  3 mL Intravenous Q12H  . sotalol  160 mg Oral BID  . spironolactone  25 mg Oral Daily   . sodium chloride 10 mL/hr at 06/23/13 0527    OBJECTIVE: Physical Exam: Filed Vitals:   06/22/13 1440 06/22/13 2000 06/22/13 2037 06/22/13 2215  BP: 119/56 110/53    Pulse: 77 76    Temp: 97.4 F (36.3 C) 97.3 F (36.3 C)    TempSrc: Oral Oral    Resp: 18 18    Height:      Weight:      SpO2: 100% 99% 96% 97%    Intake/Output Summary (Last 24 hours) at 06/23/13 0558 Last data filed at 06/22/13 2300  Gross per 24 hour  Intake   1280 ml  Output   1825 ml  Net   -545 ml    Telemetry reveals atrial pacing with intrinsic ventricular conduction, no VT  GEN- The patient is well appearing, alert and oriented x 3 today.   Head- normocephalic, atraumatic Eyes-  Sclera clear, conjunctiva pink Ears- hearing intact Oropharynx- clear Neck- supple  Lungs- Clear to ausculation bilaterally, normal work of breathing Heart- Regular rate and rhythm,   GI- soft, NT, ND, + BS Extremities- no clubbing, cyanosis, or edema Skin- no rash or lesion Psych- euthymic mood, full affect Neuro- strength and sensation are intact  LABS: Basic Metabolic Panel:  Recent Labs  06/21/13 1222  06/21/13 1705 06/22/13 0607  NA 135  --  132*  K 3.5  --  3.4*  CL 98  --  95*  CO2 23  --  25  GLUCOSE 111*  --  87  BUN 8  --  9  CREATININE 0.81  --  0.81  CALCIUM 9.5  --  9.2  MG  --  1.6 1.6   CBC:  Recent Labs  06/21/13 1222  WBC 9.4  NEUTROABS 7.8*  HGB 9.1*  HCT 30.2*  MCV 77.4*  PLT 344   Anemia Panel:  Recent Labs  06/21/13 1705 06/22/13 0607  FERRITIN  --  18*  TIBC 352  --   IRON 15*  --     RADIOLOGY: Dg Chest 2 View 06/21/2013   CLINICAL DATA:  Defibrillator malfunction. CABG. Hypertension. Chest pain.  EXAM: CHEST  2 VIEW  COMPARISON:  06/12/2013  FINDINGS: Mild hyperinflation. Prior median sternotomy. Osteopenia. Mild lower thoracic vertebral body height loss. This is unchanged.  Pacer/AICD device. Leads at right atrium and right ventricle. No lead discontinuity. Midline trachea. Biapical pleural thickening. Mild cardiomegaly with atherosclerosis in the transverse aorta. No pleural effusion or pneumothorax.   Interstitial thickening. Suspected at least 1 right upper lobe calcified granuloma.  IMPRESSION: 1. No evidence of pacer discontinuity. 2. COPD/chronic bronchitis and cardiomegaly. No acute superimposed process.   Electronically Signed   By: Kyle  Talbot M.D.   On: 06/21/2013 13:02   ASSESSMENT AND PLAN:  Principal Problem:   VENTRICULAR TACHYCARDIA Active Problems:   ISCHEMIC CARDIOMYOPATHY   Atrial fibrillation  1. VT The patient has had recurrent VT despite medical therapy.  We will proceed with VT ablation today.  Risk, benefits, and alternatives to EP study and radiofrequency ablation were also discussed in detail today. These risks include but are not limited to stroke, bleeding, vascular damage, tamponade, perforation, damage to the heart and other structures, AV block requiring pacemaker, worsening renal function, and death. The patient understands these risk and wishes to proceed.  We will therefore proceed with catheter ablation  today.  2. afib Coumadin is on hold for now Continue sotalol  3. CAD/ischemic CM Stable No change required today    

## 2013-06-23 NOTE — Anesthesia Preprocedure Evaluation (Addendum)
Anesthesia Evaluation  Patient identified by MRN, date of birth, ID band Patient awake    Reviewed: Allergy & Precautions, H&P , NPO status , Patient's Chart, lab work & pertinent test results, reviewed documented beta blocker date and time   Airway Mallampati: I TM Distance: >3 FB Neck ROM: Full    Dental  (+) Teeth Intact   Pulmonary pneumonia -, COPDformer smoker,  breath sounds clear to auscultation        Cardiovascular hypertension, Pt. on medications + CAD, + Past MI, + Peripheral Vascular Disease and +CHF + dysrhythmias Ventricular Tachycardia + Cardiac Defibrillator Rhythm:Irregular     Neuro/Psych    GI/Hepatic   Endo/Other  diabetes  Renal/GU      Musculoskeletal   Abdominal   Peds  Hematology   Anesthesia Other Findings   Reproductive/Obstetrics                         Anesthesia Physical Anesthesia Plan  ASA: III  Anesthesia Plan: MAC   Post-op Pain Management:    Induction: Intravenous  Airway Management Planned: Mask  Additional Equipment:   Intra-op Plan:   Post-operative Plan:   Informed Consent: I have reviewed the patients History and Physical, chart, labs and discussed the procedure including the risks, benefits and alternatives for the proposed anesthesia with the patient or authorized representative who has indicated his/her understanding and acceptance.   Dental advisory given  Plan Discussed with: CRNA and Surgeon  Anesthesia Plan Comments:        Anesthesia Quick Evaluation

## 2013-06-23 NOTE — Progress Notes (Signed)
Pt placed in single hospital gown, jewelry removed. Pt has been NPO since MN.Will continue to monitor. Baron Hamper, RN

## 2013-06-23 NOTE — Transfer of Care (Signed)
Immediate Anesthesia Transfer of Care Note  Patient: [name]  Procedure(s) Performed: Procedure(s): V-TACH ABLATION (N/A)  Patient Location: PACU  Anesthesia Type:MAC  Level of Consciousness: awake and alert   Airway & Oxygen Therapy: Patient Spontanous Breathing  Post-op Assessment: Report given to PACU RN  Post vital signs: Reviewed and stable  Complications: No apparent anesthesia complications

## 2013-06-23 NOTE — Progress Notes (Signed)
Pt's BP dropping back down into the 70's. Pt asymptomatic in bed at this time. Dr. Johney Frame notified. No new orders obtained. Will continue to monitor.

## 2013-06-23 NOTE — Preoperative (Signed)
Beta Blockers   Reason not to administer Beta Blockers:Not Applicable 

## 2013-06-23 NOTE — H&P (View-Only) (Signed)
SUBJECTIVE: The patient is doing well today.  At this time, he denies chest pain, shortness of breath, or any new concerns. Pending VT ablation today.  States "dizziness" has improved.   CURRENT MEDICATIONS: . ALPRAZolam  0.5 mg Oral Once  . arformoterol  15 mcg Nebulization Q12H  . budesonide  0.5 mg Nebulization BID  . digoxin  0.125 mg Oral Daily  . furosemide  40 mg Oral Daily  . loratadine  10 mg Oral Daily  . losartan  12.5 mg Oral Daily  . megestrol  400 mg Oral Daily  . metoprolol succinate  25 mg Oral Daily  . montelukast  10 mg Oral QHS  . pantoprazole  40 mg Oral Daily  . potassium chloride  10 mEq Oral Daily  . predniSONE  2.5 mg Oral Daily  . simvastatin  40 mg Oral QHS  . sodium chloride  3 mL Intravenous Q12H  . sotalol  160 mg Oral BID  . spironolactone  25 mg Oral Daily   . sodium chloride 10 mL/hr at 06/23/13 0527    OBJECTIVE: Physical Exam: Filed Vitals:   06/22/13 1440 06/22/13 2000 06/22/13 2037 06/22/13 2215  BP: 119/56 110/53    Pulse: 77 76    Temp: 97.4 F (36.3 C) 97.3 F (36.3 C)    TempSrc: Oral Oral    Resp: 18 18    Height:      Weight:      SpO2: 100% 99% 96% 97%    Intake/Output Summary (Last 24 hours) at 06/23/13 0558 Last data filed at 06/22/13 2300  Gross per 24 hour  Intake   1280 ml  Output   1825 ml  Net   -545 ml    Telemetry reveals atrial pacing with intrinsic ventricular conduction, no VT  GEN- The patient is well appearing, alert and oriented x 3 today.   Head- normocephalic, atraumatic Eyes-  Sclera clear, conjunctiva pink Ears- hearing intact Oropharynx- clear Neck- supple  Lungs- Clear to ausculation bilaterally, normal work of breathing Heart- Regular rate and rhythm,   GI- soft, NT, ND, + BS Extremities- no clubbing, cyanosis, or edema Skin- no rash or lesion Psych- euthymic mood, full affect Neuro- strength and sensation are intact  LABS: Basic Metabolic Panel:  Recent Labs  40/98/11 1222  06/21/13 1705 06/22/13 0607  NA 135  --  132*  K 3.5  --  3.4*  CL 98  --  95*  CO2 23  --  25  GLUCOSE 111*  --  87  BUN 8  --  9  CREATININE 0.81  --  0.81  CALCIUM 9.5  --  9.2  MG  --  1.6 1.6   CBC:  Recent Labs  06/21/13 1222  WBC 9.4  NEUTROABS 7.8*  HGB 9.1*  HCT 30.2*  MCV 77.4*  PLT 344   Anemia Panel:  Recent Labs  06/21/13 1705 06/22/13 0607  FERRITIN  --  18*  TIBC 352  --   IRON 15*  --     RADIOLOGY: Dg Chest 2 View 06/21/2013   CLINICAL DATA:  Defibrillator malfunction. CABG. Hypertension. Chest pain.  EXAM: CHEST  2 VIEW  COMPARISON:  06/12/2013  FINDINGS: Mild hyperinflation. Prior median sternotomy. Osteopenia. Mild lower thoracic vertebral body height loss. This is unchanged.  Pacer/AICD device. Leads at right atrium and right ventricle. No lead discontinuity. Midline trachea. Biapical pleural thickening. Mild cardiomegaly with atherosclerosis in the transverse aorta. No pleural effusion or pneumothorax.  Interstitial thickening. Suspected at least 1 right upper lobe calcified granuloma.  IMPRESSION: 1. No evidence of pacer discontinuity. 2. COPD/chronic bronchitis and cardiomegaly. No acute superimposed process.   Electronically Signed   By: Jeronimo Greaves M.D.   On: 06/21/2013 13:02   ASSESSMENT AND PLAN:  Principal Problem:   VENTRICULAR TACHYCARDIA Active Problems:   ISCHEMIC CARDIOMYOPATHY   Atrial fibrillation  1. VT The patient has had recurrent VT despite medical therapy.  We will proceed with VT ablation today.  Risk, benefits, and alternatives to EP study and radiofrequency ablation were also discussed in detail today. These risks include but are not limited to stroke, bleeding, vascular damage, tamponade, perforation, damage to the heart and other structures, AV block requiring pacemaker, worsening renal function, and death. The patient understands these risk and wishes to proceed.  We will therefore proceed with catheter ablation  today.  2. afib Coumadin is on hold for now Continue sotalol  3. CAD/ischemic CM Stable No change required today

## 2013-06-23 NOTE — Brief Op Note (Signed)
S/p VT ablation See dictation for full details

## 2013-06-23 NOTE — Progress Notes (Signed)
Notified Dr. Johney Frame regarding low BPs (See Flowsheet).  Patient asymptomatic.  Orders given and initiated.  Will continue to monitor BPs and call MD back.

## 2013-06-23 NOTE — Progress Notes (Signed)
Dr. Johney Frame notified of Hgb 8.2 and Hct 26.8, and BPs 89/34-40's.  New orders obtained. Will repeat CBC at midnight and call results to Dr. Johney Frame as requested. Pt is resting in bed at this time, no s/sx voiced. Right groin level 1 (small soft bruise and old drainage).

## 2013-06-23 NOTE — Interval H&P Note (Signed)
History and Physical Interval Note:  06/23/2013 7:27 AM  [name]  has presented today for surgery, with the diagnosis of vt  The various methods of treatment have been discussed with the patient and family. After consideration of risks, benefits and other options for treatment, the patient has consented to  Procedure(s): V-TACH ABLATION (N/A) as a surgical intervention .  The patient's history has been reviewed, patient examined, no change in status, stable for surgery.  I have reviewed the patient's chart and labs.  Questions were answered to the patient's satisfaction.     Hillis Range

## 2013-06-24 DIAGNOSIS — D649 Anemia, unspecified: Secondary | ICD-10-CM

## 2013-06-24 DIAGNOSIS — Z7901 Long term (current) use of anticoagulants: Secondary | ICD-10-CM

## 2013-06-24 LAB — CBC
HCT: 25.8 % — ABNORMAL LOW (ref 39.0–52.0)
Hemoglobin: 7.9 g/dL — ABNORMAL LOW (ref 13.0–17.0)
MCH: 23.4 pg — ABNORMAL LOW (ref 26.0–34.0)
MCHC: 30.6 g/dL (ref 30.0–36.0)
MCV: 76.3 fL — ABNORMAL LOW (ref 78.0–100.0)
RDW: 16.6 % — ABNORMAL HIGH (ref 11.5–15.5)
WBC: 8.2 10*3/uL (ref 4.0–10.5)

## 2013-06-24 LAB — BASIC METABOLIC PANEL
BUN: 13 mg/dL (ref 6–23)
Calcium: 8.8 mg/dL (ref 8.4–10.5)
Chloride: 98 mEq/L (ref 96–112)
GFR calc Af Amer: 87 mL/min — ABNORMAL LOW (ref >90)
GFR calc non Af Amer: 75 mL/min — ABNORMAL LOW (ref >90)
Glucose, Bld: 86 mg/dL (ref 70–99)
Sodium: 136 mEq/L (ref 135–145)

## 2013-06-24 LAB — PROTIME-INR: INR: 2 — ABNORMAL HIGH (ref 0.00–1.49)

## 2013-06-24 LAB — GLUCOSE, CAPILLARY
Glucose-Capillary: 80 mg/dL (ref 70–99)
Glucose-Capillary: 102 mg/dL — ABNORMAL HIGH (ref 70–99)
Glucose-Capillary: 110 mg/dL — ABNORMAL HIGH (ref 70–99)

## 2013-06-24 LAB — TYPE AND SCREEN
ABO/RH(D): O NEG
Antibody Screen: NEGATIVE

## 2013-06-24 MED ORDER — SPIRONOLACTONE 25 MG PO TABS
25.0000 mg | ORAL_TABLET | Freq: Every day | ORAL | Status: DC
  Administered 2013-06-24 – 2013-06-26 (×3): 25 mg via ORAL
  Filled 2013-06-24 (×3): qty 1

## 2013-06-24 MED ORDER — FERROUS SULFATE 325 (65 FE) MG PO TABS
325.0000 mg | ORAL_TABLET | Freq: Two times a day (BID) | ORAL | Status: DC
  Administered 2013-06-24 – 2013-06-26 (×5): 325 mg via ORAL
  Filled 2013-06-24 (×7): qty 1

## 2013-06-24 MED ORDER — DOCUSATE SODIUM 100 MG PO CAPS
100.0000 mg | ORAL_CAPSULE | Freq: Every day | ORAL | Status: DC
  Administered 2013-06-24 – 2013-06-26 (×3): 100 mg via ORAL
  Filled 2013-06-24 (×3): qty 1

## 2013-06-24 MED ORDER — POTASSIUM CHLORIDE CRYS ER 20 MEQ PO TBCR
40.0000 meq | EXTENDED_RELEASE_TABLET | Freq: Once | ORAL | Status: AC
  Administered 2013-06-24: 40 meq via ORAL
  Filled 2013-06-24: qty 2

## 2013-06-24 NOTE — Evaluation (Addendum)
Physical Therapy Evaluation Patient Details Name: [name] MRN: [id number] DOB: 1929/02/17 Today's Date: 06/24/2013 Time: 4098-1191 PT Time Calculation (min): 21 min  PT Assessment / Plan / Recommendation History of Present Illness  Pt admit with afib with ICD.  VT ablation 06/23/13.  Hgb 7.9.  Hypotensive.  Clinical Impression  Pt admitted with above. Pt currently with functional limitations due to the deficits listed below (see PT Problem List). Will benefit from Rehab prior to d/c home.   Pt will benefit from skilled PT to increase their independence and safety with mobility to allow discharge to the venue listed below.     PT Assessment  Patient needs continued PT services    Follow Up Recommendations  CIR;Supervision/Assistance - 24 hour                Equipment Recommendations  None recommended by PT    Recommendations for Other Services Rehab consult   Frequency Min 3X/week    Precautions / Restrictions Precautions Precautions: Fall Restrictions Weight Bearing Restrictions: No   Pertinent Vitals/Pain Desat to 76% on RA with ambulation.  Took 2 min for O2 on RA to return to 94%.  HR stable in 80's with ambulation.  No pain      Mobility  Bed Mobility Bed Mobility: Not assessed Transfers Transfers: Sit to Stand;Stand to Sit Sit to Stand: With upper extremity assist;With armrests;From chair/3-in-1;4: Min assist Stand to Sit: 3: Mod assist;With upper extremity assist;To chair/3-in-1;With armrests Details for Transfer Assistance: cues for hand placement.  Needed greater assist as pt fatigued.   Ambulation/Gait Ambulation/Gait Assistance: 1: +2 Total assist Ambulation/Gait: Patient Percentage: 70% Ambulation Distance (Feet): 175 Feet Assistive device: 2 person hand held assist;1 person hand held assist Ambulation/Gait Assistance Details: Initially pt needing only min assist to steady as he ambulated.  As pt fatigued however, pt with increasing flexed posture  as well as weakness in LEs and need for incr UE support.  By the time pt returned to room he was flexed greatly forward needing total A +2 pt =70% with bil knee and hip flexion as well.  O2 sats dropped as well with ambulation.  Pt totally fatigued at end of walk needing extensive assist to  get back to chair. Pt cautioned about paying attention to when he needs rest breaks.  According to wife, this has been a problem that has worsened over last few months.   Gait Pattern: Step-through pattern;Decreased stride length;Trunk flexed Gait velocity: decreased Stairs: No Wheelchair Mobility Wheelchair Mobility: No         PT Diagnosis: Generalized weakness  PT Problem List: Decreased activity tolerance;Decreased balance;Decreased mobility;Decreased knowledge of use of DME;Decreased safety awareness PT Treatment Interventions: DME instruction;Gait training;Functional mobility training;Therapeutic activities;Therapeutic exercise;Balance training;Patient/family education     PT Goals(Current goals can be found in the care plan section) Acute Rehab PT Goals Patient Stated Goal: to go  home PT Goal Formulation: With patient Time For Goal Achievement: 07/01/13 Potential to Achieve Goals: Good  Visit Information  Last PT Received On: 06/24/13 Assistance Needed: +2 History of Present Illness: Pt admit with afib with ICD.  VT ablation 06/23/13.  Hgb 7.9.  Hypotensive.       Prior Functioning  Home Living Family/patient expects to be discharged to:: Private residence Living Arrangements: Spouse/significant other;Children Available Help at Discharge: Family;Available 24 hours/day Type of Home: House Home Access: Stairs to enter [employer] of Steps: 1 Home Layout: One level Home Equipment: Cane - single point;Bedside commode;Shower  seat;Walker - 2 wheels Prior Function Level of Independence: Independent with assistive device(s);Needs assistance (used cane outdoors) Gait / Transfers  Assistance Needed: supervision indoors and cane outdoors  ADL's / Homemaking Assistance Needed: needed asssit with bathing and dressing recently. Communication Communication: No difficulties    Cognition  Cognition Arousal/Alertness: Awake/alert Behavior During Therapy: WFL for tasks assessed/performed Overall Cognitive Status: Within Functional Limits for tasks assessed    Extremity/Trunk Assessment Upper Extremity Assessment Upper Extremity Assessment: Defer to OT evaluation Lower Extremity Assessment Lower Extremity Assessment: Generalized weakness Cervical / Trunk Assessment Cervical / Trunk Assessment: Kyphotic   Balance Balance Balance Assessed: Yes Static Standing Balance Static Standing - Balance Support: Bilateral upper extremity supported;During functional activity Static Standing - Level of Assistance: 4: Min assist Static Standing - Comment/# of Minutes: 1 High Level Balance High Level Balance Activites: Turns;Direction changes High Level Balance Comments: Total A +2 (pt =60%) due to fatigue  End of Session PT - End of Session Equipment Utilized During Treatment: Gait belt Activity Tolerance: Patient limited by fatigue Patient left: in chair;with call bell/phone within reach;with nursing/sitter in room;with family/visitor present Nurse Communication: Mobility status       INGOLD,Viveca Beckstrom 06/24/2013, 3:37 PM [facility] [phone] (pager)

## 2013-06-24 NOTE — Progress Notes (Signed)
SUBJECTIVE: The patient is doing well today.  At this time, he denies chest pain, shortness of breath, or any new concerns. S/p VT ablation 06-23-2013.  BP soft overnight.  Pt asymptomatic currently. Some atrial fibrillation last night, currently A paced with intrinsic ventricular conduction.  No sustained VT. Short runs NSVT.  Hgb 7.9 this morning.  Hemmocult stool cards not yet sent.  Some pain in his R leg last night.  CURRENT MEDCIATIONS: . ALPRAZolam  0.5 mg Oral Once  . arformoterol  15 mcg Nebulization Q12H  . budesonide  0.5 mg Nebulization BID  . digoxin  0.125 mg Oral Daily  . furosemide  40 mg Oral Daily  . loratadine  10 mg Oral Daily  . losartan  12.5 mg Oral Daily  . megestrol  400 mg Oral Daily  . metoprolol succinate  25 mg Oral Daily  . montelukast  10 mg Oral QHS  . pantoprazole  40 mg Oral Daily  . potassium chloride  10 mEq Oral Daily  . predniSONE  2.5 mg Oral Daily  . simvastatin  40 mg Oral QHS  . sodium chloride  3 mL Intravenous Q12H  . sodium chloride  3 mL Intravenous Q12H  . sotalol  160 mg Oral BID  . spironolactone  25 mg Oral Daily      OBJECTIVE: Physical Exam: Filed Vitals:   06/24/13 0200 06/24/13 0300 06/24/13 0400 06/24/13 0500  BP: 87/54 88/29 101/42 108/60  Pulse: 99 86 84 80  Temp:      TempSrc:      Resp: 23 21 20 17   Height:      Weight:    138 lb 14.2 oz (63 kg)  SpO2: 94% 95% 96% 95%    Intake/Output Summary (Last 24 hours) at 06/24/13 4540 Last data filed at 06/24/13 0500  Gross per 24 hour  Intake    800 ml  Output   1800 ml  Net  -1000 ml    Telemetry reveals atrial pacing with intrinsic ventricular conduction, some afib overnight, NSVT  GEN- The patient is chronically ill appearing, alert and oriented x 3 today.   Head- normocephalic, atraumatic Eyes-  Sclera clear, conjunctiva pink Ears- hearing intact Oropharynx- clear Neck- supple  Lungs- Clear to ausculation bilaterally, normal work of breathing Heart-  Regular rate and rhythm, no murmurs, rbs or gallops, PMI not laterally displacedu GI- soft, NT, ND, + BS Extremities- no clubbing, cyanosis, or edema, patellar/DP/PT pulses are in tact, good cap refill, no embolic phenomenon, small R groin ecchymosis, no bruit, no significant hematoma Skin- no rash or lesion Psych- euthymic mood, full affect Neuro- strength and sensation are intact  LABS: Basic Metabolic Panel:  Recent Labs  98/11/91 1705 06/22/13 0607 06/24/13 0532  NA  --  132* 136  K  --  3.4* 3.3*  CL  --  95* 98  CO2  --  25 24  GLUCOSE  --  87 86  BUN  --  9 13  CREATININE  --  0.81 0.97  CALCIUM  --  9.2 8.8  MG 1.6 1.6  --   CBC:  Recent Labs  06/21/13 1222  06/23/13 2310 06/24/13 0532  WBC 9.4  < > 9.0 8.2  NEUTROABS 7.8*  --   --   --   HGB 9.1*  < > 8.1* 7.9*  HCT 30.2*  < > 26.7* 25.8*  MCV 77.4*  < > 76.3* 76.3*  PLT 344  < > 330  310  < > = values in this interval not displayed. Anemia Panel:  Recent Labs  06/21/13 1705 06/22/13 0607  FERRITIN  --  18*  TIBC 352  --   IRON 15*  --     RADIOLOGY: Dg Chest 2 View 06/21/2013   CLINICAL DATA:  Defibrillator malfunction. CABG. Hypertension. Chest pain.  EXAM: CHEST  2 VIEW  COMPARISON:  06/12/2013  FINDINGS: Mild hyperinflation. Prior median sternotomy. Osteopenia. Mild lower thoracic vertebral body height loss. This is unchanged.  Pacer/AICD device. Leads at right atrium and right ventricle. No lead discontinuity. Midline trachea. Biapical pleural thickening. Mild cardiomegaly with atherosclerosis in the transverse aorta. No pleural effusion or pneumothorax. Interstitial thickening. Suspected at least 1 right upper lobe calcified granuloma.  IMPRESSION: 1. No evidence of pacer discontinuity. 2. COPD/chronic bronchitis and cardiomegaly. No acute superimposed process.   Electronically Signed   By: Jeronimo Greaves M.D.   On: 06/21/2013 13:02   ASSESSMENT AND PLAN:   1. VT Doing reasonably well s/p VT ablation  yesterday Continue sotalol ICD adjusted today to promote ATP therapy (see paper chart)  2. Hypotension overnight Only small drop in hemaglobin,  I doubt this is responsible He has a h/o chronic steroid use.  Will consider stress dose steroids if BP continues to be soft Keep in TCU today  3. Anemia Iron deficient  No active bleeding Will start PO iron today  4. Ischemic CM/ chronic systolic dysfunction No symptoms of ischemia Continue current medicines as BP allows  5. afib Coumadin is on hold given recent ablation Hopefully, if hemaglobin remains stable we can restart coumadin soon Continue sotalol  Keep in TCU today Up to chair/ ambulate as able

## 2013-06-24 NOTE — Progress Notes (Signed)
Rehab Admissions Coordinator Note:  Patient was screened by Trish Mage for appropriateness for an Inpatient Acute Rehab Consult. Noted PT recommending CIR.   At this time, we are recommending Inpatient Rehab consult.  Trish Mage 06/24/2013, 4:32 PM  I can be reached at [phone].

## 2013-06-24 NOTE — Op Note (Signed)
[name], [name]                ACCOUNT NO.:  [id number]  MEDICAL RECORD NO.:  [id number]  LOCATION:  2H21C                        FACILITY:  [facility]  PHYSICIAN:  Hillis Range, MD       DATE OF BIRTH:  [birth year]  DATE OF PROCEDURE: DATE OF DISCHARGE:                              OPERATIVE REPORT   PREPROCEDURE DIAGNOSIS:  Ischemic ventricular tachycardia.  POSTPROCEDURE DIAGNOSIS:  Ischemic ventricular tachycardia.  PROCEDURES: 1. Dual-chamber implantable cardioverter-defibrillator interrogation     and reprograming. 2. Comprehensive EP study. 3. Coronary sinus pacing and recording. 4. Three-dimensional mapping of ventricular tachycardia. 5. Radiofrequency ablation of ventricular tachycardia. 6. Arterial blood pressure monitoring. 7. Left ventricular pacing and recording. 8. External defibrillation.  INTRODUCTION:  [name] is a very pleasant 79 year old gentleman with a history of symptomatic recurrent ischemic ventricular tachycardia.  He underwent a VT ablation by me in April of this year with good success. He did well initially.  Unfortunately, he has recently developed recurrent symptomatic ventricular tachycardia.  He has previously not tolerated amiodarone.  He has recently failed medical therapy with sotalol, mexiletine, and metoprolol.  He therefore presents today for EP study and radiofrequency ablation.  He has been observed clinically to have ventricular tachycardia with a cycle length of 350 milliseconds. There was a second ventricular tachycardia with a cycle length of 300 milliseconds.  DESCRIPTION OF PROCEDURE:  Informed written consent was obtained and the patient was brought to the Electrophysiology Lab in the fasting state. He was adequately sedated with intravenous medications as outlined in the Anesthesia report.  The patient's defibrillator was interrogated and tachycardia therapies were programed off.  The patient's right groin was prepped and  draped in the usual sterile fashion by the EP lab staff. Using a percutaneous Seldinger technique, one 6, one 7, and one 8 [place] hemostasis sheaths were placed into the right common femoral vein.  An 8- [place] hemostasis sheath was placed in the right common femoral artery. Due to difficulties advancing a guidewire through the right iliac artery, a 9-French Arrow stainless 24-cm sheath was advanced.  Arterial blood pressure monitoring was performed throughout the procedure today. A 7-French Biosense Webster decapolar catheter was introduced through the right common femoral vein and advanced into the coronary sinus for recording and pacing from this location.  A 6-French quadripolar Josephson catheter was introduced through the right common femoral vein and advanced into the right ventricle for recording and pacing.  This catheter was then pulled back to the His bundle location.  The patient presented to the Electrophysiology Lab in an atrial paced rhythm.  The AH interval measured 190 milliseconds with an HV interval of 50 milliseconds.  The QRS duration measured 140 milliseconds with a QT interval 419 milliseconds.  Ventricular pacing was performed which revealed midline concentric decremental VA conduction with a VA Wenckebach cycle length retrograde of 560 milliseconds.  Atrial pacing was performed which revealed decremental AV conduction with an AV Wenckebach cycle length of 500 milliseconds.  A decapolar catheter was introduced into the right common femoral artery and advanced into the left ventricle using a retrograde aortic approach.  Using this decapolar catheter, a voltage map of the  left atrium was constructed.  This demonstrated a large inferior left ventricular scar.  This was rather similar to the scar observed during his ablation in April.  The decapolar catheter was removed.  In its place, a [employer] ablation catheter was introduced.  Heparin  was administered in order to achieve adequate anticoagulation for the procedure today.  Ventricular extra stimulus testing was performed which revealed inducible ventricular tachycardia with 500/260 milliseconds. This was a hemodynamically unstable and sustained ventricular tachycardia with a cycle length of 320 milliseconds.  This was of a left bundle-branch, left inferior axis morphology.  Because of a systolic blood pressure of 50 transiently, the tachycardia was terminated with ventricular pacing at a cycle length of 280 milliseconds.  This tachycardia was again induced with 500/260/250/300 milliseconds.  Again, the patient was unstable with a tachycardia.  Could not pace terminate the tachycardia at this time and therefore the patient was successfully cardioverted with cardioversion electrodes in the anterior-posterior configuration and a single 360-joule biphasic shock delivered.  He remained in atrial paced rhythm thereafter.  I then decided to perform pace mapping from the left ventricle.  The left ventricular outflow tract as well as the right ventricle were also evaluated with pace mapping.  In these chambers, there were no adequate pace map matches. Extensive pacing was therefore performed from the left ventricle. Finally, along the basal but intraseptal border zone of the left ventricular scar, 11/12 pace map match was observed.  It was therefore felt that the tachycardia arose from the basal portion of the inferoseptal scar.  Extensive ablation was performed in this region. Additional substrate modification was performed along the border zone of the entire inferior scar.  I spent the most time along the basal and septal regions of the scar, however, I did ablate along the lateral border zone and at the apex.  With ablation at the apex, the patient developed a second ventricular tachycardia.  This was a right bundle- branch superior axis with Q-waves throughout the precordium,  suggesting that the VT arose from the apical region at which I was performing ablation.  The patient again became hemodynamically unstable.  The tachycardia spontaneously terminated and the patient's hemodynamics improved.  I therefore performed additional substrate modification along the apical region of the scar border zone.  Following ablation, the AH interval measured 179 milliseconds with an HV interval of 53 milliseconds.  The PR interval measured 306 milliseconds with a QRS duration 145 milliseconds and a QT interval 456 milliseconds.  As the patient had been under anesthesia for prolonged period of time, it was felt most prudent to stop the procedure at this point.  I therefore did not perform any additional attempts at arrhythmia induction.  The procedure was considered completed.  All catheters were removed and the sheaths were aspirated and flushed.  The patient's defibrillator was interrogated and the atrial and ventricular capture sense and lead impedance numbers were unchanged when compared to prior to the procedure.  Tachycardia therapies were programed back on at their previous settings.  These findings were present on the patient's paper chart for review.  The patient was transferred to the recovery area for sheath removal per protocol.  CONCLUSIONS: 1. Atrial paced rhythm upon presentation. 2. Inducible ventricular tachycardia today, however, the patient was     hemodynamically unstable during ventricular tachycardia, and     substrate ablation was performed today. 3. The patient had an extensive inferior left ventricular scar.  I  performed extensive substrate modification along the basal, septal,     lateral, and apical borders of the scar.  Pace mapping suggested     that the inducible tachycardia     today likely arose from the basal portion of the inferoseptal scar.     Additional ablation was performed in this location. 4. No early apparent  complications.     Hillis Range, MD     JA/MEDQ  D:  06/23/2013  T:  06/24/2013  Job:  [id number]

## 2013-06-24 NOTE — Progress Notes (Signed)
CARDIAC REHAB PHASE I   PRE:  Rate/Rhythm: paced 82  BP:  Supine: 99/46  Sitting: 81/45  Standing: 71/28   SaO2: 97%RA  MODE:  Ambulation: 0 ft  1115-1135 Pt lying in bed. Took orthostatics as documented above. No dizziness with standing but BP low. To recliner with call bell. PT to see later and can reassess.         Luetta Nutting, RN BSN  06/24/2013 11:32 AM   d

## 2013-06-25 DIAGNOSIS — R5381 Other malaise: Secondary | ICD-10-CM

## 2013-06-25 DIAGNOSIS — J9600 Acute respiratory failure, unspecified whether with hypoxia or hypercapnia: Secondary | ICD-10-CM

## 2013-06-25 DIAGNOSIS — I472 Ventricular tachycardia: Secondary | ICD-10-CM

## 2013-06-25 LAB — BASIC METABOLIC PANEL WITH GFR
BUN: 12 mg/dL (ref 6–23)
CO2: 21 meq/L (ref 19–32)
Calcium: 9 mg/dL (ref 8.4–10.5)
Chloride: 97 meq/L (ref 96–112)
Creatinine, Ser: 0.88 mg/dL (ref 0.50–1.35)
GFR calc Af Amer: >90 mL/min (ref >90)
GFR calc non Af Amer: 78 mL/min — ABNORMAL LOW (ref >90)
Glucose, Bld: 84 mg/dL (ref 70–99)
Potassium: 4.2 meq/L (ref 3.5–5.1)
Sodium: 132 meq/L — ABNORMAL LOW (ref 135–145)

## 2013-06-25 LAB — CBC
HCT: 24.7 % — ABNORMAL LOW (ref 39.0–52.0)
Hemoglobin: 7.5 g/dL — ABNORMAL LOW (ref 13.0–17.0)
MCH: 23.1 pg — ABNORMAL LOW (ref 26.0–34.0)
MCHC: 30.4 g/dL (ref 30.0–36.0)
MCV: 76 fL — ABNORMAL LOW (ref 78.0–100.0)
Platelets: 301 K/uL (ref 150–400)
RBC: 3.25 MIL/uL — ABNORMAL LOW (ref 4.22–5.81)
RDW: 16.4 % — ABNORMAL HIGH (ref 11.5–15.5)
WBC: 7.4 K/uL (ref 4.0–10.5)

## 2013-06-25 LAB — PROTIME-INR: INR: 1.79 — ABNORMAL HIGH (ref 0.00–1.49)

## 2013-06-25 LAB — MAGNESIUM: Magnesium: 1.7 mg/dL (ref 1.5–2.5)

## 2013-06-25 MED ORDER — MAGNESIUM SULFATE 40 MG/ML IJ SOLN
2.0000 g | Freq: Once | INTRAMUSCULAR | Status: AC
  Administered 2013-06-25: 2 g via INTRAVENOUS
  Filled 2013-06-25: qty 50

## 2013-06-25 NOTE — Progress Notes (Signed)
CARDIAC REHAB PHASE I   PRE:  Rate/Rhythm: 79 pacing    BP: sitting 116/50, standing 68/48, after 1 min 107/55    SaO2: 99 RA  MODE:  Ambulation: 270 ft   POST:  Rate/Rhythm: 87 pacing    BP: sitting 112/67     SaO2: 97 RA  Pt much improved today. Able to stand and walk with minimal assist. Reminders to stand tall and keep feet inside RW. Pt would like rollator for home. Pt BP seemingly upon standing (68/48) without sx. Took again and was normal. Return to recliner. Will f/u. 1110-1130   Elissa Lovett [address] CES, ACSM 06/25/2013 11:28 AM

## 2013-06-25 NOTE — Progress Notes (Signed)
NUTRITION FOLLOW UP  Intervention:   1.  General healthful diet; encouraged intake of food and beverages to improve nutrition. Reinforced high protein foods.  Re-educated patient on nutrition needs.   Nutrition Dx:   Inadequate oral intake r/t poor appetite AEB pt report.   Goal:  Pt to meet >/= 90% of their estimated nutrition needs, Met.   Monitor:  PO intake, supplement acceptance, weight trend, labs    Assessment:   Pt admitted after his ICD fired. Pt was admitted about a week and a half ago for the same thing. Was seen by RD during that visit and reported poor appetite x 2 years, has been on Megace for about the same time frame and feels it has helped. Pt has lost an additional 5 lb since his last admission.  Pt continues to eat well at 75% of meals consistently. Feels his appetite has improved and he has seen improvement in intake.  Pt and wife report comfort with knowing nutrition needs.  Reinforced high protein, high calorie foods at home for wt management and muscle preservation.  Pt acknowledges needs and understanding.  Height: Ht Readings from Last 1 Encounters:  06/21/13 5\' 9"  (1.753 m)    Weight Status:   Wt Readings from Last 1 Encounters:  06/25/13 139 lb 12.4 oz (63.4 kg)    Re-estimated needs:  Kcal: 1800-2000 Protein: 80-90g Fluid: >1.8 L/day  Skin: no issues noted  Diet Order: Carb Control   Intake/Output Summary (Last 24 hours) at 06/25/13 1428 Last data filed at 06/25/13 1030  Gross per 24 hour  Intake    600 ml  Output   1100 ml  Net   -500 ml    Last BM: 11/17  Labs:   Recent Labs Lab 06/21/13 1705 06/22/13 0607 06/24/13 0532 06/25/13 0500 06/25/13 1210  NA  --  132* 136 132*  --   K  --  3.4* 3.3* 4.2  --   CL  --  95* 98 97  --   CO2  --  25 24 21   --   BUN  --  9 13 12   --   CREATININE  --  0.81 0.97 0.88  --   CALCIUM  --  9.2 8.8 9.0  --   MG 1.6 1.6  --   --  1.7  GLUCOSE  --  87 86 84  --     CBG (last 3)   Recent  Labs  06/24/13 0739 06/24/13 1207 06/24/13 1724  GLUCAP 80 102* 110*    Scheduled Meds: . ALPRAZolam  0.5 mg Oral Once  . arformoterol  15 mcg Nebulization Q12H  . budesonide  0.5 mg Nebulization BID  . digoxin  0.125 mg Oral Daily  . docusate sodium  100 mg Oral Daily  . ferrous sulfate  325 mg Oral BID WC  . furosemide  40 mg Oral Daily  . loratadine  10 mg Oral Daily  . losartan  12.5 mg Oral Daily  . magnesium sulfate 1 - 4 g bolus IVPB  2 g Intravenous Once  . megestrol  400 mg Oral Daily  . metoprolol succinate  25 mg Oral Daily  . montelukast  10 mg Oral QHS  . pantoprazole  40 mg Oral Daily  . potassium chloride  10 mEq Oral Daily  . predniSONE  2.5 mg Oral Daily  . simvastatin  40 mg Oral QHS  . sodium chloride  3 mL Intravenous Q12H  . sodium chloride  3 mL Intravenous Q12H  . sotalol  160 mg Oral BID  . spironolactone  25 mg Oral Daily    Continuous Infusions:   Loyce Dys, MS RD LDN Clinical Inpatient Dietitian Pager: [phone] Weekend/After hours pager: [phone]

## 2013-06-25 NOTE — Consult Note (Signed)
Physical Medicine and Rehabilitation Consult  Reason for Consult: Decondtioning Referring Physician: Dr. Johney Frame.   HPI: [name] is a 79 y.o. male  w/ PMHx significant for CAD s/p CABG, iCM (EF 30-35% with moderate to severe MR on echo 5/14), DM2, paroxysmal afib, VT s/p ICD, s/p ablation who was recently discharged from [facility] on 11/10 after being admitted for recurrent VT with multiple ICD discharges. Device interrogated and medication changes made. He was readmitted on 06/21/13 with four ICD discharges and near syncope. Was started on mexilitene and underwent VT ablation with ICD interrogation and reprogramming on 06/23/13 by Dr. Johney Frame. Patient noted to be deconditioned with acute on chronic anemia as well as fatigue with activity. MD, PT recommending CIR.   Patient ambulated with physical therapy 350 feet today. He stated he needed to use a walker and got a little short of breath. He has one level home.  Review of Systems  Constitutional: Negative.   HENT: Negative.   Eyes: Negative.   Respiratory: Positive for shortness of breath.   Cardiovascular: Negative.   Gastrointestinal: Positive for constipation.  Genitourinary: Negative.   Musculoskeletal: Negative.   Skin: Negative.   Neurological: Negative.   Endo/Heme/Allergies: Negative.   Psychiatric/Behavioral: Negative.     Past Medical History  Diagnosis Date  . Hypertension   . Coronary artery disease     a. CABG 03/2006. b. NSTEMI 03/2012 following VT likely demand ischemia - grafts patent at cath.  . Diabetes mellitus   . Other specified forms of chronic ischemic heart disease   . Syncope and collapse   . Automatic implantable cardiac defibrillator in situ     QRS duration 120 ms no typical left bundle branch block.  . Atrial fibrillation     Taking sotalol, intolerant to amiodarone  . Valvular heart disease     Mild to moderate mitral valve insufficiency and mild aortic insufficiency  . Chronic airway obstruction, not  elsewhere classified   . Old myocardial infarction   . Other and unspecified hyperlipidemia   . Congestive heart failure, unspecified     a. Ischemic cardiomyopathy ejection fraction 30-35% previously. b. Down to 15% by cath 03/2012.  [name] Cerebrovascular disease, unspecified   . Other mechanical complication of other internal orthopedic device, implant, and graft   . Carotid artery disease     s/p LCEA 2007.  [name] Head and neck cancer     Receiving radiation therapy.  . Orthostatic hypotension   . Ventricular tachycardia     a. S/p ICD implantation. b. Recurrent in 03/2012 with medication adjustment.;  c. s/p RFCA 11/2012 (Dr. Fawn Kirk)     Past Surgical History  Procedure Laterality Date  . Repeat otif left knee with figure-of-eight tension band.    . Implantation of a dual-chamber implantable  06/18/2007  . Left carotid endarterectomy    . Coronary artery bypass graft  04/04/2006  . Vt ablation  11/06/12    Ischemic VT ablation by Dr Johney Frame    History reviewed. No pertinent family history.   Social History:  reports that he quit smoking about 22 years ago. His smoking use included Cigarettes. He has a 120 pack-year smoking history. He has never used smokeless tobacco. He reports that he does not drink alcohol or use illicit drugs.   Allergies: No Known Allergies  Medications Prior to Admission  Medication Sig Dispense Refill  . acetaminophen (TYLENOL) 500 MG tablet Take 1,000 mg by mouth 2 (two) times daily as needed (pain).      [name]  albuterol (PROVENTIL HFA;VENTOLIN HFA) 108 (90 BASE) MCG/ACT inhaler Inhale 2 puffs into the lungs every 6 (six) hours as needed for wheezing or shortness of breath.      . budesonide (PULMICORT) 0.5 MG/2ML nebulizer solution Take 0.5 mg by nebulization 2 (two) times daily.      . digoxin (LANOXIN) 0.125 MG tablet Take 1 tablet (0.125 mg total) by mouth daily.  30 tablet  2  . formoterol (PERFOROMIST) 20 MCG/2ML nebulizer solution Take 20 mcg by nebulization 2  (two) times daily.      . furosemide (LASIX) 40 MG tablet Take 1 tablet (40 mg total) by mouth daily.      [name] loratadine (CLARITIN) 10 MG tablet Take 10 mg by mouth daily. For allergies      . losartan (COZAAR) 25 MG tablet Take 0.5 tablets (12.5 mg total) by mouth daily.  30 tablet  0  . megestrol (MEGACE) 40 MG/ML suspension Take 400 mg by mouth daily. 10 mls (2 teaspoonsful)      . metoprolol succinate (TOPROL-XL) 25 MG 24 hr tablet Take 1 tablet (25 mg total) by mouth daily.  30 tablet  6  . montelukast (SINGULAIR) 10 MG tablet Take 10 mg by mouth at bedtime.       . nitroGLYCERIN (NITROSTAT) 0.4 MG SL tablet Place 1 tablet (0.4 mg total) under the tongue every 5 (five) minutes as needed for chest pain.  25 each  3  . omeprazole (PRILOSEC) 20 MG capsule Take 20 mg by mouth daily.      . potassium chloride (K-DUR) 10 MEQ tablet Take 1 tablet (10 mEq total) by mouth daily. Take 2 tablets, total 20 mEq on Monday, Wednesday and Friday.  45 tablet  11  . predniSONE (DELTASONE) 5 MG tablet Take 2.5 mg by mouth daily.       . simvastatin (ZOCOR) 40 MG tablet Take 40 mg by mouth at bedtime.       . sotalol (BETAPACE) 160 MG tablet Take 1 tablet (160 mg total) by mouth 2 (two) times daily.  60 tablet  6  . spironolactone (ALDACTONE) 25 MG tablet Take 25 mg by mouth daily.      [name] warfarin (COUMADIN) 5 MG tablet Take 0.5 tablets (2.5 mg total) by mouth at bedtime. On Wednesdays, take a whole tablet (5 mg).  40 tablet  11    Home: Home Living Family/patient expects to be discharged to:: Private residence Living Arrangements: Spouse/significant other;Children Available Help at Discharge: Family;Available 24 hours/day Type of Home: House Home Access: Stairs to enter [employer] of Steps: 1 Home Layout: One level Home Equipment: Cane - single point;Bedside commode;Shower seat;Walker - 2 wheels  Functional History:   Functional Status:  Mobility: Bed Mobility Bed Mobility: Not  assessed Transfers Transfers: Sit to Stand;Stand to Sit Sit to Stand: With upper extremity assist;With armrests;From chair/3-in-1;4: Min assist Stand to Sit: 3: Mod assist;With upper extremity assist;To chair/3-in-1;With armrests Ambulation/Gait Ambulation/Gait Assistance: 1: +2 Total assist Ambulation/Gait: Patient Percentage: 70% Ambulation Distance (Feet): 175 Feet Assistive device: 2 person hand held assist;1 person hand held assist Ambulation/Gait Assistance Details: Initially pt needing only min assist to steady as he ambulated.  As pt fatigued however, pt with increasing flexed posture as well as weakness in LEs and need for incr UE support.  By the time pt returned to room he was flexed greatly forward needing total A +2 pt =70% with bil knee and hip flexion as well.  O2 sats  dropped as well with ambulation.  Pt totally fatigued at end of walk needing extensive assist to  get back to chair.  Gait Pattern: Step-through pattern;Decreased stride length;Trunk flexed Gait velocity: decreased Stairs: No Wheelchair Mobility Wheelchair Mobility: No  ADL:    Cognition: Cognition Overall Cognitive Status: Within Functional Limits for tasks assessed Cognition Arousal/Alertness: Awake/alert Behavior During Therapy: WFL for tasks assessed/performed Overall Cognitive Status: Within Functional Limits for tasks assessed  Blood pressure 113/51, pulse 83, temperature 98.2 F (36.8 C), temperature source Oral, resp. rate 22, height 5\' 9"  (1.753 m), weight 63.4 kg (139 lb 12.4 oz), SpO2 98.00%. Physical Exam  Nursing note and vitals reviewed. Constitutional: He is oriented to person, place, and time. He appears well-developed and well-nourished.  HENT:  Head: Normocephalic and atraumatic.  Eyes: Conjunctivae and EOM are normal. Pupils are equal, round, and reactive to light.  Neck: Normal range of motion. No JVD present.  Cardiovascular: Normal rate, regular rhythm and normal heart sounds.    No murmur heard. Respiratory: Effort normal and breath sounds normal. No respiratory distress. He has no wheezes. He has no rales.  GI: Soft. Bowel sounds are normal. He exhibits no distension. There is no tenderness. There is no rebound and no guarding.  Musculoskeletal: Normal range of motion.  Well-healed amputations right second through fifth PIP  Neurological: He is alert and oriented to person, place, and time. He exhibits normal muscle tone. Coordination normal.  Psychiatric: He has a normal mood and affect. His behavior is normal. Judgment and thought content normal.    Results for orders placed during the hospital encounter of 06/21/13 (from the past 24 hour(s))  GLUCOSE, CAPILLARY     Status: Abnormal   Collection Time    06/24/13 12:07 PM      Result Value Range   Glucose-Capillary 102 (*) 70 - 99 mg/dL  GLUCOSE, CAPILLARY     Status: Abnormal   Collection Time    06/24/13  5:24 PM      Result Value Range   Glucose-Capillary 110 (*) 70 - 99 mg/dL  BASIC METABOLIC PANEL     Status: Abnormal   Collection Time    06/25/13  5:00 AM      Result Value Range   Sodium 132 (*) 135 - 145 mEq/L   Potassium 4.2  3.5 - 5.1 mEq/L   Chloride 97  96 - 112 mEq/L   CO2 21  19 - 32 mEq/L   Glucose, Bld 84  70 - 99 mg/dL   BUN 12  6 - 23 mg/dL   Creatinine, Ser 1.61  0.50 - 1.35 mg/dL   Calcium 9.0  8.4 - 09.6 mg/dL   GFR calc non Af Amer 78 (*) >90 mL/min   GFR calc Af Amer >90  >90 mL/min  CBC     Status: Abnormal   Collection Time    06/25/13  5:00 AM      Result Value Range   WBC 7.4  4.0 - 10.5 K/uL   RBC 3.25 (*) 4.22 - 5.81 MIL/uL   Hemoglobin 7.5 (*) 13.0 - 17.0 g/dL   HCT 04.5 (*) 40.9 - 81.1 %   MCV 76.0 (*) 78.0 - 100.0 fL   MCH 23.1 (*) 26.0 - 34.0 pg   MCHC 30.4  30.0 - 36.0 g/dL   RDW 91.4 (*) 78.2 - 95.6 %   Platelets 301  150 - 400 K/uL  PROTIME-INR     Status: Abnormal   Collection  Time    06/25/13  5:00 AM      Result Value Range   Prothrombin Time 20.3  (*) 11.6 - 15.2 seconds   INR 1.79 (*) 0.00 - 1.49   No results found.  Assessment/Plan: Diagnosis: Deconditioning after recurrent bouts of ventricular tachycardia. 1. Does the need for close, 24 hr/day medical supervision in concert with the patient's rehab needs make it unreasonable for this patient to be served in a less intensive setting? No 2. Co-Morbidities requiring supervision/potential complications: Atrial fibrillation, diabetes, coronary artery disease, valvular heart disease 3. Due to Not applicable, does the patient require 24 hr/day rehab nursing? No 4. Does the patient require coordinated care of a physician, rehab nurse,  not applicable to address physical and functional deficits in the context of the above medical diagnosis(es)? No Addressing deficits in the following areas: Not applicable 5. Can the patient actively participate in an intensive therapy program of at least 3 hrs of therapy per day at least 5 days per week? Yes 6. The potential for patient to make measurable gains while on inpatient rehab is Not applicable 7. Anticipated functional outcomes upon discharge from inpatient rehab are not applicable with PT,  Not applicable not applicable with OT, not applicable with SLP. 8. Estimated rehab length of stay to reach the above functional goals is: Not applicable 9. Does the patient have adequate social supports to accommodate these discharge functional goals? Yes 10. Anticipated D/C setting: Home 11. Anticipated post D/C treatments: HH therapy 12. Overall Rehab/Functional Prognosis: good  RECOMMENDATIONS: This patient's condition is appropriate for continued rehabilitative care in the following setting: [facility] Patient has agreed to participate in recommended program. Yes Note that insurance prior authorization may be required for reimbursement for recommended care.  Comment: Wife feels comfortable taking care of patient at his current functional  level    06/25/2013

## 2013-06-25 NOTE — Progress Notes (Signed)
SATURATION QUALIFICATIONS: (This note is used to comply with regulatory documentation for home oxygen)  Patient Saturations on Room Air at Rest = 96%  Patient Saturations on Room Air while Ambulating = 86%  Patient Saturations on 2 Liters of oxygen while Ambulating = 94%  Please briefly explain why patient needs home oxygen:Pt continues to desat with activity.  May need home O2 to keep sats >90% with activity. Thanks. [facility] [phone] (pager)

## 2013-06-25 NOTE — Progress Notes (Signed)
Pt given dose of IV magnesium as ordered,it has completed and transport available now to transfer pt over to [address] [place]. Pt traveling via W/C on monitor with belongings, escorted by unit staff. Wife with pt.

## 2013-06-25 NOTE — Progress Notes (Signed)
Report called to [facility] on unit 2[facility], pt will transfer to 2W15.

## 2013-06-25 NOTE — Care Management Note (Addendum)
    Page 1 of 2   06/26/2013     12:22:20 PM   CARE MANAGEMENT NOTE 06/26/2013  Patient:  [name], [name]   Account Number:  [id number]  Date Initiated:  06/25/2013  Documentation initiated by:  [name]  Subjective/Objective Assessment:   adm w v [name]     Action/Plan:   lives w wife, pcp dr [name]   Anticipated DC Date:  06/26/2013   Anticipated DC Plan:  HOME W HOME HEALTH SERVICES      DC Planning Services  CM consult      Choice offered to / List presented to:     DME arranged  OXYGEN  OTHER - SEE COMMENT      DME agency  [facility]     HH arranged  HH-1 RN  HH-2 PT      [facility] agency  [facility]   Status of service:   Medicare Important Message given?   (If response is "NO", the following Medicare IM given date fields will be blank) Date Medicare IM given:   Date Additional Medicare IM given:    Discharge Disposition:  HOME W HOME HEALTH SERVICES  Per UR Regulation:  Reviewed for med. necessity/level of care/duration of stay  If discussed at Long Length of Stay Meetings, dates discussed:    Comments:  06-26-13 12:20pm [name], RNBSN - [phone] Set up for [name] RN, PT and rollator and oxygen with [facility]. Plan for discharge today.  11/20 1132a [name] dowell rn,bsn spoke w pt and wife. he prefers to go home. phy ther rec cir and ref obtained. if he goes home instead of rehab he wants to use [facility]. did not need o2 w card rehab today. pt would like rolling walker w seat (rolator). amb 257ft w card rehab. will alert [facility] may be able to go home instead of rehab. await final decisions.

## 2013-06-25 NOTE — Progress Notes (Signed)
SUBJECTIVE: The patient is doing well today.  At this time, he denies chest pain, shortness of breath, or any new concerns. S/p VT ablation 06-23-2013.  BP is stable.  He had one episode of fast VT yesterday for which he received a single ICD shock.  No arrhythmias since.  Pt asymptomatic currently.  Leg pain is better.  CURRENT MEDCIATIONS: . ALPRAZolam  0.5 mg Oral Once  . arformoterol  15 mcg Nebulization Q12H  . budesonide  0.5 mg Nebulization BID  . digoxin  0.125 mg Oral Daily  . docusate sodium  100 mg Oral Daily  . ferrous sulfate  325 mg Oral BID WC  . furosemide  40 mg Oral Daily  . loratadine  10 mg Oral Daily  . losartan  12.5 mg Oral Daily  . megestrol  400 mg Oral Daily  . metoprolol succinate  25 mg Oral Daily  . montelukast  10 mg Oral QHS  . pantoprazole  40 mg Oral Daily  . potassium chloride  10 mEq Oral Daily  . predniSONE  2.5 mg Oral Daily  . simvastatin  40 mg Oral QHS  . sodium chloride  3 mL Intravenous Q12H  . sodium chloride  3 mL Intravenous Q12H  . sotalol  160 mg Oral BID  . spironolactone  25 mg Oral Daily      OBJECTIVE: Physical Exam: Filed Vitals:   06/24/13 2039 06/24/13 2300 06/24/13 2325 06/25/13 0500  BP:   98/45   Pulse:      Temp:  98.2 F (36.8 C)    TempSrc:  Oral    Resp:   17   Height:      Weight:    139 lb 12.4 oz (63.4 kg)  SpO2: 98%       Intake/Output Summary (Last 24 hours) at 06/25/13 0810 Last data filed at 06/24/13 1700  Gross per 24 hour  Intake    600 ml  Output    700 ml  Net   -100 ml    Telemetry reveals atrial pacing with intrinsic ventricular conduction, VT with ICD shock x 1 yesterday am  GEN- The patient is chronically ill appearing, alert and oriented x 3 today.   Head- normocephalic, atraumatic Eyes-  Sclera clear, conjunctiva pink Ears- hearing intact Oropharynx- clear Neck- supple  Lungs- Clear to ausculation bilaterally, normal work of breathing Heart- Regular rate and rhythm, no murmurs,  rbs or gallops, PMI not laterally displacedu GI- soft, NT, ND, + BS Extremities- no clubbing, cyanosis, or edema, patellar/DP/PT pulses are in tact, good cap refill, no embolic phenomenon, small R groin ecchymosis, small bruit, no significant hematoma Skin- no rash or lesion Psych- euthymic mood, full affect Neuro- strength and sensation are intact  LABS: Basic Metabolic Panel:  Recent Labs  04/54/09 0532 06/25/13 0500  NA 136 132*  K 3.3* 4.2  CL 98 97  CO2 24 21  GLUCOSE 86 84  BUN 13 12  CREATININE 0.97 0.88  CALCIUM 8.8 9.0  CBC:  Recent Labs  06/24/13 0532 06/25/13 0500  WBC 8.2 7.4  HGB 7.9* 7.5*  HCT 25.8* 24.7*  MCV 76.3* 76.0*  PLT 310 301   Anemia Panel: No results found for this basename: VITAMINB12, FOLATE, FERRITIN, TIBC, IRON, RETICCTPCT,  in the last 72 hours  RADIOLOGY: Dg Chest 2 View 06/21/2013   CLINICAL DATA:  Defibrillator malfunction. CABG. Hypertension. Chest pain.  EXAM: CHEST  2 VIEW  COMPARISON:  06/12/2013  FINDINGS: Mild  hyperinflation. Prior median sternotomy. Osteopenia. Mild lower thoracic vertebral body height loss. This is unchanged.  Pacer/AICD device. Leads at right atrium and right ventricle. No lead discontinuity. Midline trachea. Biapical pleural thickening. Mild cardiomegaly with atherosclerosis in the transverse aorta. No pleural effusion or pneumothorax. Interstitial thickening. Suspected at least 1 right upper lobe calcified granuloma.  IMPRESSION: 1. No evidence of pacer discontinuity. 2. COPD/chronic bronchitis and cardiomegaly. No acute superimposed process.   Electronically Signed   By: Jeronimo Greaves M.D.   On: 06/21/2013 13:02   ASSESSMENT AND PLAN:   1. VT Doing reasonably well s/p VT ablation yesterday Continue sotalol  2. Hypotension overnight He has a h/o chronic steroid use.  Will consider stress dose steroids if BP continues to be soft  3. Anemia Iron deficient  No active bleeding Now on PO iron  4. Ischemic CM/  chronic systolic dysfunction No symptoms of ischemia Continue current medicines as BP allows  5. afib Coumadin is on hold given recent ablation Hopefully, if hemaglobin remains stable we can restart coumadin soon Continue sotalol  PT to assess again today and help with disposition decision as well as assess need for O2 Up to chair/ ambulate as able  Possibly home in next 24-48 hours

## 2013-06-25 NOTE — Progress Notes (Addendum)
Physical Therapy Treatment Patient Details Name: [name] MRN: [id number] DOB: 10/31/1928 Today's Date: 06/25/2013 Time: 1478-2956 PT Time Calculation (min): 30 min  PT Assessment / Plan / Recommendation  History of Present Illness Pt admit with afib with ICD.  VT ablation 06/23/13.  Hgb 7.9.  Hypotensive.   PT Comments   Pt admitted with above. Pt currently with functional limitations due to continued balance and endurance deficits. O2Sats still dropped today on RA  but not as much as yesterday.  Pt with a little better endurance today as well.  If pt can get a bed on REhab, still feel he would benefit from it.   Pt will benefit from skilled PT to increase their independence and safety with mobility to allow discharge to the venue listed below.   Follow Up Recommendations  CIR;Supervision/Assistance - 24 hour     Does the patient have the potential to tolerate intense rehabilitation   yes          Equipment Recommendations  Will benefit from rollator for home    Recommendations for Other Services Rehab consult  Frequency Min 3X/week   Progress towards PT Goals Progress towards PT goals: Progressing toward goals  Plan Current plan remains appropriate    Precautions / Restrictions Precautions Precautions: Fall Restrictions Weight Bearing Restrictions: No   Pertinent Vitals/Pain Orthostatic BPs Initial HR was 80 bpm Supine 110/35, 92 bpm  Sitting 105/52, 97 bpm  Standing 105/40, 92 bpm  Standing after 3 min 113/51, 82 bpm  BP 123/53 with HR 87 bpm after walk.   Pt sats on RA on arrival were 96%; Sats as pt ambulated were 93% and > most of walk until the last 25 feet the patient desat to 86% on RA.  Recovered to 94% within one min of rest in chair.  Pt c/o no pain.    Mobility  Bed Mobility Bed Mobility: Rolling Left;Left Sidelying to Sit;Sitting - Scoot to Edge of Bed Rolling Left: 4: Min assist Left Sidelying to Sit: 4: Min assist Sitting - Scoot to Edge of Bed:  4: Min assist Details for Bed Mobility Assistance: Needed incr time and cues to get to EOB.  Transfers Transfers: Sit to Stand;Stand to Sit Sit to Stand: With upper extremity assist;With armrests;From chair/3-in-1;4: Min assist Stand to Sit: 4: Min assist;With upper extremity assist;With armrests;To chair/3-in-1 Details for Transfer Assistance: cues for hand placement.   Ambulation/Gait Ambulation/Gait Assistance: 4: Min assist Ambulation Distance (Feet): 350 Feet Assistive device: Rolling walker Ambulation/Gait Assistance Details: Pt ambulated much better today with RW.  Pt reports just feeling better overall.  Less DOE today as well.  Sats were good on RA ntil last 25 feet of ambulation at 86% with 1 min of recovery to incr to >90%.  Orthostatic vitals much better today as well.  Cues needed to stay close to RW as well.   Gait Pattern: Step-through pattern;Decreased stride length;Trunk flexed Gait velocity: decreased Stairs: No Wheelchair Mobility Wheelchair Mobility: No         PT Goals (current goals can now be found in the care plan section)    Visit Information  Last PT Received On: 06/25/13 Assistance Needed: +2 History of Present Illness: Pt admit with afib with ICD.  VT ablation 06/23/13.  Hgb 7.9.  Hypotensive.    Subjective Data  Subjective: "I feel a lot better today."   Cognition  Cognition Arousal/Alertness: Awake/alert Behavior During Therapy: WFL for tasks assessed/performed Overall Cognitive Status: Within Functional Limits  for tasks assessed    Balance  Static Standing Balance Static Standing - Balance Support: Bilateral upper extremity supported;During functional activity Static Standing - Level of Assistance: 5: Stand by assistance Static Standing - Comment/# of Minutes: 2 High Level Balance High Level Balance Activites: Turns;Direction changes High Level Balance Comments: Needed only min assist for challenges today.    End of Session PT - End of  Session Equipment Utilized During Treatment: Gait belt Activity Tolerance: Patient limited by fatigue Patient left: in chair;with call bell/phone within reach;with nursing/sitter in room;with family/visitor present Nurse Communication: Mobility status        INGOLD,[name] 06/25/2013, 11:39 AM  [name] [phone] (pager)

## 2013-06-26 ENCOUNTER — Encounter (HOSPITAL_COMMUNITY): Payer: Self-pay | Admitting: *Deleted

## 2013-06-26 LAB — CBC
HCT: 25 % — ABNORMAL LOW (ref 39.0–52.0)
MCH: 23.5 pg — ABNORMAL LOW (ref 26.0–34.0)
MCHC: 30.8 g/dL (ref 30.0–36.0)
MCV: 76.2 fL — ABNORMAL LOW (ref 78.0–100.0)
Platelets: 288 10*3/uL (ref 150–400)
RDW: 16.6 % — ABNORMAL HIGH (ref 11.5–15.5)

## 2013-06-26 LAB — PROTIME-INR
INR: 1.48 (ref 0.00–1.49)
Prothrombin Time: 17.5 seconds — ABNORMAL HIGH (ref 11.6–15.2)

## 2013-06-26 MED ORDER — WARFARIN SODIUM 5 MG PO TABS: 2.5000 mg | ORAL_TABLET | Freq: Every day | ORAL | Status: DC

## 2013-06-26 MED ORDER — DSS 100 MG PO CAPS: 100.0000 mg | ORAL_CAPSULE | Freq: Every day | ORAL | Status: DC

## 2013-06-26 MED ORDER — FERROUS SULFATE 325 (65 FE) MG PO TABS: 325.0000 mg | ORAL_TABLET | Freq: Two times a day (BID) | ORAL | Status: DC

## 2013-06-26 NOTE — Progress Notes (Signed)
Physical Therapy Treatment Patient Details Name: [name] MRN: [id number] DOB: 11-16-1928 Today's Date: 06/26/2013 Time: 4098-1191 PT Time Calculation (min): 30 min  PT Assessment / Plan / Recommendation  History of Present Illness Pt admit with afib with ICD.  VT ablation 06/23/13.  Hgb 7.9.  Hypotensive.   PT Comments   Pt admitted with above. Pt currently with functional limitations due to continued endurance deficits.  Pt plans to d/c home with wife today.  MD aware of need for O2, rollator and HHPT f/u.  Spoke with case manager regarding ordering home equipment and [facility] therapy.  Pt will benefit from skilled PT to increase their independence and safety with mobility to allow discharge to the venue listed below.   Follow Up Recommendations  Home health PT;Supervision/Assistance - 24 hour                 Equipment Recommendations  Other (comment) (Rollator -4 wheeled walker with seat,home O2) Also recommend pulse oximeter which pt's wife will pick up from [employer].      Frequency Min 3X/week   Progress towards PT Goals Progress towards PT goals: Progressing toward goals  Plan Current plan remains appropriate    Precautions / Restrictions Precautions Precautions: Fall Restrictions Weight Bearing Restrictions: No   Pertinent Vitals/Pain O2 sats dropped to 86% again with activity on RA.  No pain    Mobility  Bed Mobility Bed Mobility: Rolling Right;Right Sidelying to Sit Rolling Right: 7: Independent Right Sidelying to Sit: 7: Independent Sitting - Scoot to Edge of Bed: 7: Independent Transfers Transfers: Sit to Stand;Stand to Sit Sit to Stand: 5: Supervision;With upper extremity assist;From bed Stand to Sit: 5: Supervision;With upper extremity assist;To bed Details for Transfer Assistance: cues for hand placement.   Ambulation/Gait Ambulation/Gait Assistance: 5: Supervision Ambulation Distance (Feet): 350 Feet Assistive device: Rolling walker Ambulation/Gait  Assistance Details: Pt continues to progress with RW.  Wife able to assist pt today with RW with no problems.  Sats continue to drop after pt has ambulated about 150 feet.  Recovers to >90% when resting for up to 30 seconds.  Recommend that pt get a rollator as this will help pt be able to have rest breaks.    Also told pt and wife they could buy a pulse ox from [place] for 10 dollars.  They plan to go and get one.   Gait Pattern: Step-through pattern;Decreased stride length;Trunk flexed Gait velocity: decreased Stairs: No Wheelchair Mobility Wheelchair Mobility: No     PT Goals (current goals can now be found in the care plan section)    Visit Information  Last PT Received On: 06/26/13 Assistance Needed: +1 History of Present Illness: Pt admit with afib with ICD.  VT ablation 06/23/13.  Hgb 7.9.  Hypotensive.    Subjective Data  Subjective: "I am ready to go home."   Cognition  Cognition Arousal/Alertness: Awake/alert Behavior During Therapy: WFL for tasks assessed/performed Overall Cognitive Status: Within Functional Limits for tasks assessed    Balance  Static Standing Balance Static Standing - Balance Support: Bilateral upper extremity supported;During functional activity Static Standing - Level of Assistance: 5: Stand by assistance Static Standing - Comment/# of Minutes: 3 High Level Balance High Level Balance Activites: Turns;Direction changes High Level Balance Comments: Needed no assist for challenges.  End of Session PT - End of Session Equipment Utilized During Treatment: Gait belt;Oxygen Activity Tolerance: Patient tolerated treatment well Patient left: in bed;with call bell/phone within reach;with family/visitor present Nurse Communication:  Mobility status       INGOLD,Kinta Martis 06/26/2013, 9:28 AM  [name] [phone] (pager)

## 2013-06-26 NOTE — Progress Notes (Signed)
SATURATION QUALIFICATIONS: (This note is used to comply with regulatory documentation for home oxygen)  Patient Saturations on Room Air at Rest = 95%  Patient Saturations on Room Air while Ambulating = 86%  Patient Saturations on 2 Liters of oxygen while Ambulating = 94%  Please briefly explain why patient needs home oxygen:Pt desats when not using O2 with activity.  Will need home O2 to keep sats >90%.  Thanks. [facility] [phone] (pager)

## 2013-06-26 NOTE — Progress Notes (Signed)
Rehab admissions - Evaluated for possible admission.  Please see rehab consult recommending HH therapies.  Agree with home with [facility] therapies when medically ready for discharge.  Call me for questions.  #[phone]

## 2013-06-26 NOTE — Progress Notes (Signed)
06/26/13 Nursing Patient given AVS, medication list and discharge instructions, all questions were answered. Patient has rolator and O2 in the room and [facility], has been set up. Will discharge home with wife as ordered. Pinky Ravan, Randall An  rN

## 2013-06-26 NOTE — Discharge Summary (Signed)
ELECTROPHYSIOLOGY DISCHARGE SUMMARY    Patient ID: [name],  MRN: [id number], DOB/AGE: 08/28/1928 79 y.o.  Admit date: 06/21/2013 Discharge date: 06/26/2013  Primary Care Physician: Wyvonnia Lora, MD Primary Cardiologist: Hillis Range, MD  Primary Discharge Diagnosis:  1. VT  Doing reasonably well s/p VT ablation this admission Continue sotalol and toprol  2. Anemia  Iron deficient  No active bleeding  Now on PO iron  He needs to see Dr Margo Common at next available to evaluate anemia further.  Given weight loss and iron deficiency, outpatient colonoscopy should be considered once more clinically stable  3. Ischemic CM with chronic systolic HF No symptoms of ischemia  Continue current medicines as BP allows  4. Atrial fibrillation  Restart coumadin 2.5mg  daily  Follow-up with our anticoagulation clinic in 1 week  Continue sotalol  5. Acute on chronic SOB  Will need O2 at home with ambulation as per PT note  6. Deconditioning  He declines CIR  Will plan to proceed with HHPT and discharge today.  Secondary Discharge Diagnoses:  1. CAD s/p CABG 2. Moderate to severe MR 3. DM 4. HTN 5. Orthostatic hypotension 6. Head and neck CA  Procedures This Admission:   1. Dual-chamber implantable cardioverter-defibrillator interrogation and reprograming.  2. Comprehensive EP study.  3. Coronary sinus pacing and recording.  4. Three-dimensional mapping of ventricular tachycardia.  5. Radiofrequency ablation of ventricular tachycardia.  6. Arterial blood pressure monitoring.  7. Left ventricular pacing and recording.  8. External defibrillation. CONCLUSIONS:  1. Atrial paced rhythm upon presentation.  2. Inducible ventricular tachycardia today, however, the patient was  hemodynamically unstable during ventricular tachycardia, and  substrate ablation was performed today.  3. The patient had an extensive inferior left ventricular scar. I  performed extensive substrate  modification along the basal, septal,  lateral, and apical borders of the scar. Pace mapping suggested  that the inducible tachycardia  today likely arose from the basal portion of the inferoseptal scar.  Additional ablation was performed in this location.  4. No early apparent complications.  History and Hospital Course:  [name] is a pleasant 79 year old gentleman with symptomatic, recurrent ischemic ventricular tachycardia. He underwent a VT ablation by Dr. Johney Frame in April of this year with good success. He did well initially. Unfortunately, he recently developed recurrent symptomatic ventricular tachycardia and was admitted 06/21/2013 with ICD shocks. He was seen in consultation by Dr. Johney Frame on 06/22/2013. He has previously not tolerated amiodarone. He has recently failed medical therapy with sotalol, mexiletine and metoprolol. He has been observed clinically to have ventricular tachycardia with a cycle length of 350 milliseconds. There was a second ventricular tachycardia with a cycle length of 300 milliseconds. Therefore, Dr. Johney Frame recommended repeat VT ablation. He underwent repeat VT ablation on 06/23/2013. [name] tolerated this procedure well without any immediate complication. He remained for close observation following his procedure. He had a few runs of NSVT but no sustained VT. He was continued on sotalol. PT evaluation was performed and [name] declined CIR. Home health PT was recommended due to deconditioning. He remains hemodynamically stable and afebrile. His groin site is intact without significant bleeding or hematoma. He has been given discharge instructions including wound care and activity restrictions. He will have early follow-up with Dr. Johney Frame on 07/09/2013. He has been seen, examined and deemed stable for discharge today by Dr. Hillis Range.    Discharge Vitals: Blood pressure 102/62, pulse 77, temperature 97.4 F (36.3 C), temperature source  Oral, resp. rate 20,  height 5\' 9"  (1.753 m), weight 139 lb (63.05 kg), SpO2 96.00%.   Labs: Lab Results  Component Value Date   WBC 7.5 06/26/2013   HGB 7.7* 06/26/2013   HCT 25.0* 06/26/2013   MCV 76.2* 06/26/2013   PLT 288 06/26/2013     Recent Labs Lab 06/25/13 0500  NA 132*  K 4.2  CL 97  CO2 21  BUN 12  CREATININE 0.88  CALCIUM 9.0  GLUCOSE 84   Recent Labs  06/26/13 0535  INR 1.48    Disposition:  The patient is being discharged in stable condition.  Follow-up:     Follow-up Information   Follow up with Laqueta Linden, MD On 07/07/2013. (At 11:00 AM)    Specialty:  Cardiology   Contact information:   [address] [place] [address] [phone]       Follow up with [facility] On 07/09/2013. (At 11:00 AM to see Dr. Johney Frame)    Specialty:  Cardiology   Contact information:   [address] [place] [address] [phone]     Discharge Medications:    Medication List         acetaminophen 500 MG tablet  Commonly known as:  TYLENOL  Take 1,000 mg by mouth 2 (two) times daily as needed (pain).     albuterol 108 (90 BASE) MCG/ACT inhaler  Commonly known as:  PROVENTIL HFA;VENTOLIN HFA  Inhale 2 puffs into the lungs every 6 (six) hours as needed for wheezing or shortness of breath.     budesonide 0.5 MG/2ML nebulizer solution  Commonly known as:  PULMICORT  Take 0.5 mg by nebulization 2 (two) times daily.     digoxin 0.125 MG tablet  Commonly known as:  LANOXIN  Take 1 tablet (0.125 mg total) by mouth daily.     DSS 100 MG Caps  Take 100 mg by mouth daily.     ferrous sulfate 325 (65 FE) MG tablet  Take 1 tablet (325 mg total) by mouth 2 (two) times daily with a meal.     formoterol 20 MCG/2ML nebulizer solution  Commonly known as:  PERFOROMIST  Take 20 mcg by nebulization 2 (two) times daily.     furosemide 40 MG tablet  Commonly known as:  LASIX  Take 1 tablet (40 mg total) by mouth daily.      loratadine 10 MG tablet  Commonly known as:  CLARITIN  Take 10 mg by mouth daily. For allergies     losartan 25 MG tablet  Commonly known as:  COZAAR  Take 0.5 tablets (12.5 mg total) by mouth daily.     megestrol 40 MG/ML suspension  Commonly known as:  MEGACE  Take 400 mg by mouth daily. 10 mls (2 teaspoonsful)     metoprolol succinate 25 MG 24 hr tablet  Commonly known as:  TOPROL-XL  Take 1 tablet (25 mg total) by mouth daily.     montelukast 10 MG tablet  Commonly known as:  SINGULAIR  Take 10 mg by mouth at bedtime.     nitroGLYCERIN 0.4 MG SL tablet  Commonly known as:  NITROSTAT  Place 1 tablet (0.4 mg total) under the tongue every 5 (five) minutes as needed for chest pain.     omeprazole 20 MG capsule  Commonly known as:  PRILOSEC  Take 20 mg by mouth daily.     potassium chloride  10 MEQ tablet  Commonly known as:  K-DUR  Take 1 tablet (10 mEq total) by mouth daily. Take 2 tablets, total 20 mEq on Monday, Wednesday and Friday.     predniSONE 5 MG tablet  Commonly known as:  DELTASONE  Take 2.5 mg by mouth daily.     simvastatin 40 MG tablet  Commonly known as:  ZOCOR  Take 40 mg by mouth at bedtime.     sotalol 160 MG tablet  Commonly known as:  BETAPACE  Take 1 tablet (160 mg total) by mouth 2 (two) times daily.     spironolactone 25 MG tablet  Commonly known as:  ALDACTONE  Take 25 mg by mouth daily.     warfarin 5 MG tablet  Commonly known as:  COUMADIN  Take 0.5 tablets (2.5 mg total) by mouth at bedtime.       Duration of Discharge Encounter: Greater than 30 minutes including physician time.  Limmie Patricia, PA-C 06/26/2013, 10:32 AM   Hillis Range MD

## 2013-06-26 NOTE — Progress Notes (Signed)
SUBJECTIVE: The patient is doing well today.  At this time, he denies chest pain, shortness of breath, or any new concerns. S/p VT ablation 06-23-2013.  BP is stable. No further VT since 06-24-13. Hgb stable this morning at 7.7.   Pt ambulated with PT yesterday who recommended CIR - patient would prefer to go home with HHPT and walker.  PT also recommended O2 with ambulation.  They have used [facility] in the past.   CURRENT MEDCIATIONS: . ALPRAZolam  0.5 mg Oral Once  . arformoterol  15 mcg Nebulization Q12H  . budesonide  0.5 mg Nebulization BID  . digoxin  0.125 mg Oral Daily  . docusate sodium  100 mg Oral Daily  . ferrous sulfate  325 mg Oral BID WC  . furosemide  40 mg Oral Daily  . loratadine  10 mg Oral Daily  . losartan  12.5 mg Oral Daily  . megestrol  400 mg Oral Daily  . metoprolol succinate  25 mg Oral Daily  . montelukast  10 mg Oral QHS  . pantoprazole  40 mg Oral Daily  . potassium chloride  10 mEq Oral Daily  . predniSONE  2.5 mg Oral Daily  . simvastatin  40 mg Oral QHS  . sodium chloride  3 mL Intravenous Q12H  . sodium chloride  3 mL Intravenous Q12H  . sotalol  160 mg Oral BID  . spironolactone  25 mg Oral Daily      OBJECTIVE: Physical Exam: Filed Vitals:   06/25/13 1924 06/25/13 2043 06/25/13 2125 06/26/13 0441  BP:  96/50 104/52 112/66  Pulse:  79 78 76  Temp:  97.5 F (36.4 C)  97.4 F (36.3 C)  TempSrc:  Oral  Oral  Resp:  20  20  Height:      Weight:    139 lb (63.05 kg)  SpO2: 91% 97%  96%    Intake/Output Summary (Last 24 hours) at 06/26/13 0615 Last data filed at 06/26/13 0443  Gross per 24 hour  Intake    650 ml  Output   2175 ml  Net  -1525 ml    Telemetry reveals atrial pacing with intrinsic ventricular conduction, no further VT  GEN- The patient is chronically ill appearing, alert and oriented x 3 today.   Head- normocephalic, atraumatic Eyes-  Sclera clear, conjunctiva pink Ears- hearing intact Oropharynx-  clear Neck- supple  Lungs- Clear to ausculation bilaterally, normal work of breathing Heart- Regular rate and rhythm, no murmurs, rbs or gallops, PMI not laterally displacedu GI- soft, NT, ND, + BS Extremities- no clubbing, cyanosis, or edema, patellar/DP/PT pulses are in tact, good cap refill, no embolic phenomenon, small R groin ecchymosis, small bruit, no significant hematoma Skin- no rash or lesion Psych- euthymic mood, full affect Neuro- strength and sensation are intact  LABS: Basic Metabolic Panel:  Recent Labs  16/10/96 0532 06/25/13 0500 06/25/13 1210  NA 136 132*  --   K 3.3* 4.2  --   CL 98 97  --   CO2 24 21  --   GLUCOSE 86 84  --   BUN 13 12  --   CREATININE 0.97 0.88  --   CALCIUM 8.8 9.0  --   MG  --   --  1.7  CBC:  Recent Labs  06/25/13 0500 06/26/13 0535  WBC 7.4 7.5  HGB 7.5* 7.7*  HCT 24.7* 25.0*  MCV 76.0* 76.2*  PLT 301 288    RADIOLOGY: Dg  Chest 2 View 06/21/2013   CLINICAL DATA:  Defibrillator malfunction. CABG. Hypertension. Chest pain.  EXAM: CHEST  2 VIEW  COMPARISON:  06/12/2013  FINDINGS: Mild hyperinflation. Prior median sternotomy. Osteopenia. Mild lower thoracic vertebral body height loss. This is unchanged.  Pacer/AICD device. Leads at right atrium and right ventricle. No lead discontinuity. Midline trachea. Biapical pleural thickening. Mild cardiomegaly with atherosclerosis in the transverse aorta. No pleural effusion or pneumothorax. Interstitial thickening. Suspected at least 1 right upper lobe calcified granuloma.  IMPRESSION: 1. No evidence of pacer discontinuity. 2. COPD/chronic bronchitis and cardiomegaly. No acute superimposed process.   Electronically Signed   By: Jeronimo Greaves M.D.   On: 06/21/2013 13:02   ASSESSMENT AND PLAN:   1. VT Doing reasonably well s/p VT ablation yesterday Continue sotalol and toprol  2. Anemia Iron deficient  No active bleeding Now on PO iron He needs to see Dr Margo Common at next available to  evaluate anemia further. Given weight loss and iron deficiency, outpatient colonoscopy should be considered once more clinically stable  4. Ischemic CM/ chronic systolic dysfunction No symptoms of ischemia Continue current medicines as BP allows  5. afib Restart coumadin 2.5mg  daily Follow-up with our anticoagulation clinic in 1 week Continue sotalol  6. Acute on chronic SOB Will need O2 at home with ambulation as per PT note  7. Deconditioning He declines CIR Will plan to proceed with HHPT and discharge today.  Keep scheduled follow-up with me 07/09/13.

## 2013-06-30 ENCOUNTER — Ambulatory Visit (INDEPENDENT_AMBULATORY_CARE_PROVIDER_SITE_OTHER): Payer: Medicare Other | Admitting: *Deleted

## 2013-06-30 DIAGNOSIS — I4891 Unspecified atrial fibrillation: Secondary | ICD-10-CM

## 2013-06-30 DIAGNOSIS — Z7901 Long term (current) use of anticoagulants: Secondary | ICD-10-CM

## 2013-06-30 LAB — POCT INR: INR: 1.3

## 2013-07-03 ENCOUNTER — Encounter: Payer: Self-pay | Admitting: Cardiology

## 2013-07-03 ENCOUNTER — Inpatient Hospital Stay (HOSPITAL_COMMUNITY): Payer: Medicare Other

## 2013-07-03 ENCOUNTER — Encounter (HOSPITAL_COMMUNITY): Payer: Self-pay | Admitting: Physician Assistant

## 2013-07-03 ENCOUNTER — Inpatient Hospital Stay (HOSPITAL_COMMUNITY)
Admission: AD | Admit: 2013-07-03 | Discharge: 2013-07-12 | DRG: 871 | Disposition: A | Discharge status: Home Health | Payer: Medicare Other | Source: Other Acute Inpatient Hospital | Attending: Cardiology | Admitting: Cardiology

## 2013-07-03 DIAGNOSIS — Z7901 Long term (current) use of anticoagulants: Secondary | ICD-10-CM

## 2013-07-03 DIAGNOSIS — I519 Heart disease, unspecified: Secondary | ICD-10-CM

## 2013-07-03 DIAGNOSIS — Z9981 Dependence on supplemental oxygen: Secondary | ICD-10-CM

## 2013-07-03 DIAGNOSIS — I472 Ventricular tachycardia, unspecified: Secondary | ICD-10-CM

## 2013-07-03 DIAGNOSIS — I359 Nonrheumatic aortic valve disorder, unspecified: Secondary | ICD-10-CM | POA: Diagnosis present

## 2013-07-03 DIAGNOSIS — E43 Unspecified severe protein-calorie malnutrition: Secondary | ICD-10-CM | POA: Diagnosis present

## 2013-07-03 DIAGNOSIS — I779 Disorder of arteries and arterioles, unspecified: Secondary | ICD-10-CM | POA: Diagnosis present

## 2013-07-03 DIAGNOSIS — J189 Pneumonia, unspecified organism: Secondary | ICD-10-CM

## 2013-07-03 DIAGNOSIS — I2510 Atherosclerotic heart disease of native coronary artery without angina pectoris: Secondary | ICD-10-CM

## 2013-07-03 DIAGNOSIS — E872 Acidosis, unspecified: Secondary | ICD-10-CM | POA: Diagnosis present

## 2013-07-03 DIAGNOSIS — E785 Hyperlipidemia, unspecified: Secondary | ICD-10-CM | POA: Diagnosis present

## 2013-07-03 DIAGNOSIS — E2740 Unspecified adrenocortical insufficiency: Secondary | ICD-10-CM

## 2013-07-03 DIAGNOSIS — Z8249 Family history of ischemic heart disease and other diseases of the circulatory system: Secondary | ICD-10-CM

## 2013-07-03 DIAGNOSIS — D509 Iron deficiency anemia, unspecified: Secondary | ICD-10-CM | POA: Diagnosis present

## 2013-07-03 DIAGNOSIS — I5023 Acute on chronic systolic (congestive) heart failure: Secondary | ICD-10-CM

## 2013-07-03 DIAGNOSIS — E119 Type 2 diabetes mellitus without complications: Secondary | ICD-10-CM | POA: Diagnosis present

## 2013-07-03 DIAGNOSIS — I10 Essential (primary) hypertension: Secondary | ICD-10-CM | POA: Diagnosis present

## 2013-07-03 DIAGNOSIS — J9601 Acute respiratory failure with hypoxia: Secondary | ICD-10-CM

## 2013-07-03 DIAGNOSIS — J9620 Acute and chronic respiratory failure, unspecified whether with hypoxia or hypercapnia: Secondary | ICD-10-CM

## 2013-07-03 DIAGNOSIS — E2749 Other adrenocortical insufficiency: Secondary | ICD-10-CM | POA: Diagnosis present

## 2013-07-03 DIAGNOSIS — I509 Heart failure, unspecified: Secondary | ICD-10-CM | POA: Diagnosis present

## 2013-07-03 DIAGNOSIS — I059 Rheumatic mitral valve disease, unspecified: Secondary | ICD-10-CM | POA: Diagnosis present

## 2013-07-03 DIAGNOSIS — I4891 Unspecified atrial fibrillation: Secondary | ICD-10-CM

## 2013-07-03 DIAGNOSIS — R634 Abnormal weight loss: Secondary | ICD-10-CM

## 2013-07-03 DIAGNOSIS — J438 Other emphysema: Secondary | ICD-10-CM | POA: Diagnosis present

## 2013-07-03 DIAGNOSIS — Z95810 Presence of automatic (implantable) cardiac defibrillator: Secondary | ICD-10-CM

## 2013-07-03 DIAGNOSIS — D649 Anemia, unspecified: Secondary | ICD-10-CM

## 2013-07-03 DIAGNOSIS — A419 Sepsis, unspecified organism: Principal | ICD-10-CM

## 2013-07-03 DIAGNOSIS — I4729 Other ventricular tachycardia: Secondary | ICD-10-CM | POA: Diagnosis present

## 2013-07-03 DIAGNOSIS — Z951 Presence of aortocoronary bypass graft: Secondary | ICD-10-CM

## 2013-07-03 DIAGNOSIS — I214 Non-ST elevation (NSTEMI) myocardial infarction: Secondary | ICD-10-CM | POA: Diagnosis present

## 2013-07-03 DIAGNOSIS — R570 Cardiogenic shock: Secondary | ICD-10-CM

## 2013-07-03 DIAGNOSIS — I2589 Other forms of chronic ischemic heart disease: Secondary | ICD-10-CM

## 2013-07-03 HISTORY — DX: Chronic systolic (congestive) heart failure: I50.22

## 2013-07-03 HISTORY — DX: Abnormal weight loss: R63.4

## 2013-07-03 HISTORY — DX: Pneumonia, unspecified organism: J18.9

## 2013-07-03 HISTORY — DX: Ischemic cardiomyopathy: I25.5

## 2013-07-03 HISTORY — DX: Anemia, unspecified: D64.9

## 2013-07-03 MED ORDER — ONDANSETRON HCL 4 MG/2ML IJ SOLN: 4.0000 mg | Freq: Four times a day (QID) | INTRAMUSCULAR | Status: DC | PRN

## 2013-07-03 MED ORDER — PANTOPRAZOLE SODIUM 40 MG PO TBEC
40.0000 mg | DELAYED_RELEASE_TABLET | Freq: Every day | ORAL | Status: DC
  Administered 2013-07-03 – 2013-07-12 (×10): 40 mg via ORAL
  Filled 2013-07-03 (×8): qty 1

## 2013-07-03 MED ORDER — ASPIRIN 325 MG PO TABS
325.0000 mg | ORAL_TABLET | ORAL | Status: AC
  Administered 2013-07-03: 325 mg via ORAL
  Filled 2013-07-03: qty 1

## 2013-07-03 MED ORDER — MEGESTROL ACETATE 40 MG/ML PO SUSP
400.0000 mg | Freq: Every day | ORAL | Status: DC
  Administered 2013-07-04 – 2013-07-12 (×9): 400 mg via ORAL
  Filled 2013-07-03 (×10): qty 10

## 2013-07-03 MED ORDER — WARFARIN - PHYSICIAN DOSING INPATIENT: Freq: Every day | Status: DC

## 2013-07-03 MED ORDER — PIPERACILLIN-TAZOBACTAM 3.375 G IVPB
3.3750 g | Freq: Three times a day (TID) | INTRAVENOUS | Status: DC
  Administered 2013-07-04 – 2013-07-06 (×8): 3.375 g via INTRAVENOUS
  Filled 2013-07-03 (×10): qty 50

## 2013-07-03 MED ORDER — MONTELUKAST SODIUM 10 MG PO TABS
10.0000 mg | ORAL_TABLET | Freq: Every day | ORAL | Status: DC
  Administered 2013-07-03 – 2013-07-11 (×9): 10 mg via ORAL
  Filled 2013-07-03 (×11): qty 1

## 2013-07-03 MED ORDER — HYDROCORTISONE SOD SUCCINATE 100 MG IJ SOLR
50.0000 mg | Freq: Three times a day (TID) | INTRAMUSCULAR | Status: DC
  Administered 2013-07-03 – 2013-07-04 (×2): 50 mg via INTRAVENOUS
  Filled 2013-07-03 (×6): qty 1

## 2013-07-03 MED ORDER — ASPIRIN 81 MG PO CHEW
81.0000 mg | CHEWABLE_TABLET | Freq: Every day | ORAL | Status: DC
  Administered 2013-07-04 – 2013-07-12 (×9): 81 mg via ORAL
  Filled 2013-07-03 (×7): qty 1

## 2013-07-03 MED ORDER — WARFARIN SODIUM 2.5 MG PO TABS
2.5000 mg | ORAL_TABLET | Freq: Every day | ORAL | Status: DC
  Filled 2013-07-03: qty 1

## 2013-07-03 MED ORDER — VANCOMYCIN HCL 500 MG IV SOLR
500.0000 mg | Freq: Two times a day (BID) | INTRAVENOUS | Status: DC
  Administered 2013-07-04 – 2013-07-05 (×5): 500 mg via INTRAVENOUS
  Filled 2013-07-03 (×6): qty 500

## 2013-07-03 MED ORDER — ALBUTEROL SULFATE HFA 108 (90 BASE) MCG/ACT IN AERS
2.0000 | INHALATION_SPRAY | Freq: Four times a day (QID) | RESPIRATORY_TRACT | Status: DC | PRN
  Administered 2013-07-05 – 2013-07-07 (×3): 2 via RESPIRATORY_TRACT
  Filled 2013-07-03: qty 6.7

## 2013-07-03 MED ORDER — SODIUM CHLORIDE 0.9 % IJ SOLN
3.0000 mL | INTRAMUSCULAR | Status: DC | PRN
  Administered 2013-07-04 – 2013-07-07 (×3): 3 mL via INTRAVENOUS
  Administered 2013-07-09: 10 mL via INTRAVENOUS

## 2013-07-03 MED ORDER — SODIUM CHLORIDE 0.9 % IV SOLN
INTRAVENOUS | Status: DC
  Administered 2013-07-03: 23:00:00 via INTRAVENOUS

## 2013-07-03 MED ORDER — ARFORMOTEROL TARTRATE 15 MCG/2ML IN NEBU
15.0000 ug | INHALATION_SOLUTION | Freq: Two times a day (BID) | RESPIRATORY_TRACT | Status: DC
  Administered 2013-07-04 – 2013-07-12 (×17): 15 ug via RESPIRATORY_TRACT
  Filled 2013-07-03 (×23): qty 2

## 2013-07-03 MED ORDER — DOPAMINE-DEXTROSE 3.2-5 MG/ML-% IV SOLN
2.0000 ug/kg/min | INTRAVENOUS | Status: DC
  Administered 2013-07-03: 5 ug/kg/min via INTRAVENOUS
  Administered 2013-07-04: 6 ug/kg/min via INTRAVENOUS
  Filled 2013-07-03: qty 250

## 2013-07-03 MED ORDER — SPIRONOLACTONE 25 MG PO TABS
25.0000 mg | ORAL_TABLET | Freq: Every day | ORAL | Status: DC
  Filled 2013-07-03: qty 1

## 2013-07-03 MED ORDER — DOCUSATE SODIUM 100 MG PO CAPS
100.0000 mg | ORAL_CAPSULE | Freq: Every day | ORAL | Status: DC
  Administered 2013-07-04 – 2013-07-12 (×7): 100 mg via ORAL
  Filled 2013-07-03 (×10): qty 1

## 2013-07-03 MED ORDER — SODIUM CHLORIDE 0.9 % IJ SOLN
3.0000 mL | Freq: Two times a day (BID) | INTRAMUSCULAR | Status: DC
  Administered 2013-07-04 – 2013-07-10 (×11): 3 mL via INTRAVENOUS

## 2013-07-03 MED ORDER — SODIUM CHLORIDE 0.9 % IV SOLN: 250.0000 mL | INTRAVENOUS | Status: DC | PRN

## 2013-07-03 MED ORDER — WARFARIN SODIUM 2.5 MG PO TABS
2.5000 mg | ORAL_TABLET | Freq: Every day | ORAL | Status: DC
  Administered 2013-07-04 – 2013-07-11 (×8): 2.5 mg via ORAL
  Filled 2013-07-03 (×9): qty 1

## 2013-07-03 MED ORDER — SIMVASTATIN 40 MG PO TABS
40.0000 mg | ORAL_TABLET | Freq: Every day | ORAL | Status: DC
  Administered 2013-07-03 – 2013-07-11 (×9): 40 mg via ORAL
  Filled 2013-07-03 (×10): qty 1

## 2013-07-03 MED ORDER — HEPARIN SODIUM (PORCINE) 5000 UNIT/ML IJ SOLN
5000.0000 [IU] | Freq: Three times a day (TID) | INTRAMUSCULAR | Status: DC
  Filled 2013-07-03 (×2): qty 1

## 2013-07-03 MED ORDER — BUDESONIDE 0.5 MG/2ML IN SUSP
0.5000 mg | Freq: Two times a day (BID) | RESPIRATORY_TRACT | Status: DC
  Administered 2013-07-04 – 2013-07-12 (×16): 0.5 mg via RESPIRATORY_TRACT
  Filled 2013-07-03 (×21): qty 2

## 2013-07-03 MED ORDER — DIGOXIN 125 MCG PO TABS
0.1250 mg | ORAL_TABLET | Freq: Every day | ORAL | Status: DC
  Filled 2013-07-03: qty 1

## 2013-07-03 MED ORDER — FERROUS SULFATE 325 (65 FE) MG PO TABS
325.0000 mg | ORAL_TABLET | Freq: Two times a day (BID) | ORAL | Status: DC
  Administered 2013-07-04 – 2013-07-12 (×17): 325 mg via ORAL
  Filled 2013-07-03 (×20): qty 1

## 2013-07-03 MED ORDER — POTASSIUM CHLORIDE ER 10 MEQ PO TBCR
10.0000 meq | EXTENDED_RELEASE_TABLET | Freq: Every day | ORAL | Status: DC
  Administered 2013-07-03 – 2013-07-12 (×10): 10 meq via ORAL
  Filled 2013-07-03 (×10): qty 1

## 2013-07-03 MED ORDER — ACETAMINOPHEN 500 MG PO TABS: 1000.0000 mg | ORAL_TABLET | Freq: Two times a day (BID) | ORAL | Status: DC | PRN

## 2013-07-03 MED ORDER — DIGOXIN 125 MCG PO TABS
0.1250 mg | ORAL_TABLET | Freq: Every day | ORAL | Status: DC
  Administered 2013-07-04 – 2013-07-12 (×9): 0.125 mg via ORAL
  Filled 2013-07-03 (×9): qty 1

## 2013-07-03 MED ORDER — SODIUM CHLORIDE 0.9 % IV SOLN
INTRAVENOUS | Status: AC
  Administered 2013-07-03: 23:00:00 via INTRAVENOUS

## 2013-07-03 MED ORDER — LORATADINE 10 MG PO TABS
10.0000 mg | ORAL_TABLET | Freq: Every day | ORAL | Status: DC
  Administered 2013-07-03 – 2013-07-12 (×10): 10 mg via ORAL
  Filled 2013-07-03 (×11): qty 1

## 2013-07-03 MED ORDER — MEGESTROL ACETATE 40 MG/ML PO SUSP
400.0000 mg | Freq: Every day | ORAL | Status: DC
  Filled 2013-07-03: qty 10

## 2013-07-03 MED ORDER — ACETAMINOPHEN 325 MG PO TABS: 650.0000 mg | ORAL_TABLET | ORAL | Status: DC | PRN

## 2013-07-03 NOTE — Progress Notes (Signed)
PHARMACY CONSULT NOTE - Initial Consult  Pharmacy Consult for Heparin per Rx (warfarin per MD) Indication: chest pain/ACS, Afib  Pharmacy Consult for Vancomycin/Zosyn Indication: HCAP  Allergies  Allergen Reactions  . Amiodarone     Intolerant   Patient Measurements: Height: 5\' 9"  (175.3 cm) Weight: 142 lb 6.7 oz (64.6 kg) IBW/kg (Calculated) : 70.7  Vital Signs: Temp: 96.4 F (35.8 C) (11/28 2155) Temp src: Oral (11/28 2155) BP: 135/46 mmHg (11/28 2300) Pulse Rate: 82 (11/28 2300)  Estimated Creatinine Clearance: 60.2 ml/min (by C-G formula based on Cr of 0.88).  Medications:  Warfarin PTA (INR is 1.96)  Assessment: 79 y/o M with recent DC from [facility], now a transfer from [place] with respiratory problems. WBC 21.9, Other labs as above. See cardiology note for heparin/warfarin.   Goal of Therapy:  Heparin level 0.3-0.7 units/ml Monitor platelets by anticoagulation protocol: Yes   Plan:  -Warfarin per MD (per MD notes, no plan for intervention currently, so will continue warfarin and bridge with heparin for afib, INR is 1.96, so likely will not need but 1-2 days of heparin depending on INR this AM) -Heparin at 900 units/hr, NO BOLUS given INR 1.96 -8 hour HL at 0900 -Daily CBC/HL -Monitor for bleeding -DC heparin after INR is >2 if no plans for cath, if plans for cath, would DC warfarin   -Vancomycin 500 mg IV q12h -Zosyn 3.375G IV q8h to be infused over 4 hours -Trend WBC, temp, renal function  -F/U cultures, imaging  Thank you for allowing me to take part in this patient's care,  Abran Duke, PharmD Clinical Pharmacist Phone: [phone] Pager: [phone] 07/03/2013 11:20 PM

## 2013-07-03 NOTE — H&P (Addendum)
Patient ID: [name] MRN: [id number], DOB/AGE: 10-Feb-1929   Admit date: 07/03/2013   Primary Physician: Louie Boston, MD Primary Cardiologist: Artist Beach Primary Electrophysiologist: Allred  Pt. Profile:  [name] is an 79 y.o. male w/ PMHx significant for CAD s/p CABG, iCM (EF 30-35% with moderate to severe MR on echo 5/14), DM2, paroxysmal afib, VT s/p ICD, s/p ablation x2 (last 06/23/13 with Dr. Johney Frame) who was recently discharged from [facility] on 11/21 who is admitted as a transfer from [facility] for respiratory failure.   Problem List  Past Medical History  Diagnosis Date  . Hypertension   . Coronary artery disease     a. CABG 03/2006. b. NSTEMI 03/2012 following VT likely demand ischemia - grafts patent at cath.  . Diabetes mellitus   . Ischemic cardiomyopathy     a. With chronic systolic CHF.  [name] Syncope and collapse   . Atrial fibrillation     a. Taking sotalol - intolerant to amiodarone. b. On Coumadin.  . Valvular heart disease     Mild to moderate mitral valve insufficiency and mild aortic insufficiency  . Chronic airway obstruction, not elsewhere classified   . Old myocardial infarction   . Other and unspecified hyperlipidemia   . Congestive heart failure, unspecified     a. Ischemic cardiomyopathy ejection fraction 30-35% previously. b. Down to 15% by cath 03/2012.  [name] Cerebrovascular disease, unspecified   . Other mechanical complication of other internal orthopedic device, implant, and graft   . Carotid artery disease     s/p LCEA 2007.  [name] Head and neck cancer     Receiving radiation therapy.  . Orthostatic hypotension   . Ventricular tachycardia     a. S/p ICD implantation. b. Recurrent in 03/2012 with medication adjustment. c. s/p RFCA 11/2012 (Dr. Fawn Kirk) ; d. Recent adm 06/2013 for VT/ICD shock after failing med rx with sotalol, mexiletine and metoprolol - repeat ablation 06-23-2013 by Dr Johney Frame.  . Anemia     a. 06/2013 notes: "Given weight loss and  iron deficiency, outpatient colonoscopy should be considered once more clinically stable."  . Hypoxia     a. Qualified for home O2 with ambulation during 06/2013 adm.  . Weight loss     Past Surgical History  Procedure Laterality Date  . Repeat otif left knee with figure-of-eight tension band.    . Implantation of a dual-chamber implantable  06/18/2007  . Left carotid endarterectomy    . Coronary artery bypass graft  04/04/2006  . Vt ablation  11/06/12; 06-23-2013    Ischemic VT ablation by Dr Johney Frame; repeat 06-23-2013 by Dr Johney Frame     Allergies  Allergies  Allergen Reactions  . Amiodarone     Intolerant    HPI  [name] is an 79 y.o. male w/ PMHx significant for CAD s/p CABG, iCM (EF 30-35% with moderate to severe MR on echo 5/14), DM2, paroxysmal afib, VT s/p ICD, s/p ablation x2 (last 06/23/13 with Dr. Johney Frame) who was recently discharged from [facility] on 11/21 who is admitted as a transfer from [facility] for pulmonary edema.   [name] was admitted from 11/16-11/21/14 for recurrent VT. He underwent VT ablation with Dr. Johney Frame on 06/23/13. VT was hemodynamically unstable and he underwent pace mapping with extensive substrate modification of an inferior scar along the basal, septal, lateral, and apical borders of the scar. He was discharged on increased dose of sotalol (120mg  BID increased to 160mg  BID), anticoagulation (warfarin), and  HF (spiro/metop/losartan/dig/lasix 40mg ).  PT recommended home oxygen and rehab. He refused rehab. He states he was doing well at home until this AM at 4AM when he developed a dry "hacking" cough and "chills." Over the day developed progressive fatigue, dyspnea, and eventually pleuritic CP.  No temp taken. No abd pain, dysuria, or back pain. Due to progressive sx EMS was called and patient was transported to [place].   [place] ED Course: Arrived in resp distress, BiPAP placed CXR: Pulmonary edema, unable to exclude superimposed PNA Labs: WBC 12.7,   93% PMN, hct 32.8, ABG on 10L = 7.31/41/71, Cr 1,  BNP 915, TnI 0.10 (ULN = 0.03), CKMB 7.7, LFTs nl, lactate 4.1, INR 1.8 Meds given: LVQ, azithro, CTX, 40mg  IV lasix, 1L NS, 4mg  zofran, albuterol/ipratropium, 125mg  solumedrol Was on BiPAP for some time, switched to 50% venturi mask UOP after lasix = 125cc over 3 hrs Blood cx drawn Normal saline given for hypotension, switched to DPA on transfer.  On arrival [name] was on of DPA with systolic BP of 111. Reports he is feeling better.   Of note, [name] has been compliant with his meds and low sodium diet. Has used 2L [place] continuously at home; no changes in this. He reports losing 3-4# since discharge. No LE edema, PND, orthopnea. No ICD shock.    Home Medications  Prior to Admission medications   Medication Sig Start Date End Date Taking? Authorizing Provider  acetaminophen (TYLENOL) 500 MG tablet Take 1,000 mg by mouth 2 (two) times daily as needed (pain).    Historical Provider, MD  albuterol (PROVENTIL HFA;VENTOLIN HFA) 108 (90 BASE) MCG/ACT inhaler Inhale 2 puffs into the lungs every 6 (six) hours as needed for wheezing or shortness of breath.    Historical Provider, MD  budesonide (PULMICORT) 0.5 MG/2ML nebulizer solution Take 0.5 mg by nebulization 2 (two) times daily.    Historical Provider, MD  digoxin (LANOXIN) 0.125 MG tablet Take 1 tablet (0.125 mg total) by mouth daily. 01/28/13   Richarda Overlie, MD  docusate sodium 100 MG CAPS Take 100 mg by mouth daily. 06/26/13   Brooke O Edmisten, PA-C  ferrous sulfate 325 (65 FE) MG tablet Take 1 tablet (325 mg total) by mouth 2 (two) times daily with a meal. 06/26/13   Brooke O Edmisten, PA-C  formoterol (PERFOROMIST) 20 MCG/2ML nebulizer solution Take 20 mcg by nebulization 2 (two) times daily.    Historical Provider, MD  furosemide (LASIX) 40 MG tablet Take 1 tablet (40 mg total) by mouth daily. 03/30/13   Hillis Range, MD  loratadine (CLARITIN) 10 MG tablet Take 10 mg by mouth daily.  For allergies    Historical Provider, MD  losartan (COZAAR) 25 MG tablet Take 0.5 tablets (12.5 mg total) by mouth daily. 01/28/13   Richarda Overlie, MD  megestrol (MEGACE) 40 MG/ML suspension Take 400 mg by mouth daily. 10 mls (2 teaspoonsful) 10/23/12   Rollene Rotunda, MD  metoprolol succinate (TOPROL-XL) 25 MG 24 hr tablet Take 1 tablet (25 mg total) by mouth daily. 03/30/13   Hillis Range, MD  montelukast (SINGULAIR) 10 MG tablet Take 10 mg by mouth at bedtime.  05/22/13   Historical Provider, MD  nitroGLYCERIN (NITROSTAT) 0.4 MG SL tablet Place 1 tablet (0.4 mg total) under the tongue every 5 (five) minutes as needed for chest pain. 11/26/11   June Leap, MD  omeprazole (PRILOSEC) 20 MG capsule Take 20 mg by mouth daily.    Historical  Provider, MD  potassium chloride (K-DUR) 10 MEQ tablet Take 1 tablet (10 mEq total) by mouth daily. Take 2 tablets, total 20 mEq on Monday, Wednesday and Friday. 06/15/13   Rhonda G Barrett, PA-C  predniSONE (DELTASONE) 5 MG tablet Take 2.5 mg by mouth daily.  08/28/11   June Leap, MD  simvastatin (ZOCOR) 40 MG tablet Take 40 mg by mouth at bedtime.     Historical Provider, MD  sotalol (BETAPACE) 160 MG tablet Take 1 tablet (160 mg total) by mouth 2 (two) times daily. 06/15/13   Rhonda G Barrett, PA-C  spironolactone (ALDACTONE) 25 MG tablet Take 25 mg by mouth daily.    Historical Provider, MD  warfarin (COUMADIN) 5 MG tablet Take 0.5 tablets (2.5 mg total) by mouth at bedtime. 06/26/13   Minda Meo, PA-C    Family History  Family History  Problem Relation Age of Onset  . CAD      Social History  History   Social History  . Marital Status: Married    Spouse Name: N/A    Number of Children: N/A  . Years of Education: N/A   Occupational History  . RETIRED    Social History Main Topics  . Smoking status: Former Smoker -- 2.00 packs/day for 60 years    Types: Cigarettes    Quit date: 08/06/1990  . Smokeless tobacco: Never Used  . Alcohol  Use: No  . Drug Use: No  . Sexual Activity: Yes   Other Topics Concern  . Not on file   Social History Narrative   Pt does not get regular exercise.     Review of Systems General:  (+) chills, fever, night sweats or weight changes.  Cardiovascular:  (+) pleuritic CP. No edema, orthopnea, palpitations, paroxysmal nocturnal dyspnea, ICD shocks Dermatological: Occasional ecchymoses.  Respiratory: See HPI Urologic: No hematuria, dysuria Abdominal:   No abd pain. (+) N/V while at OSH ED, none currently.  Neurologic:  No visual changes, wkns, changes in mental status. All other systems reviewed and are otherwise negative except as noted above.  Physical Exam  Blood pressure 135/46, pulse 82, temperature 96.4 F (35.8 C), temperature source Oral, resp. rate 11, height 5\' 9"  (1.753 m), weight 64.6 kg (142 lb 6.7 oz), SpO2 100.00%.  General: Pleasant, elderly NAD Psych: Normal affect. Neuro: Alert and oriented. Moves all extremities spontaneously. HEENT: Normal, dry tongue  Neck: Supple without bruits. Neck veins flat. Lungs:  Resp regular, mildly labored. Speaking in complete sentences. Decreased BS at base. No rales.  Heart: RRR no s3, s4, II/VI systolic murmur with radiation to axilla.  Abdomen: Soft, non-tender, non-distended, BS + x 4.  Extremities: No clubbing, cyanosis or edema. DP/PT/Radials 1+ and equal bilaterally. R groin with a small 1cm hematoma with ? Soft bruit. L groin looks good. Femoral pulses 1+ bilaterally. Extremities warm. Skin: Skin tenting present.  Labs  Troponin (Point of Care Test) No results found for this basename: TROPIPOC,  in the last 72 hours No results found for this basename: CKTOTAL, CKMB, TROPONINI,  in the last 72 hours Lab Results  Component Value Date   WBC 7.5 06/26/2013   HGB 7.7* 06/26/2013   HCT 25.0* 06/26/2013   MCV 76.2* 06/26/2013   PLT 288 06/26/2013   No results found for this basename: NA, K, CL, CO2, BUN, CREATININE, CALCIUM,  LABALBU, PROT, BILITOT, ALKPHOS, ALT, AST, GLUCOSE,  in the last 168 hours Lab Results  Component Value Date  CHOL 134 03/06/2012   HDL 32* 03/06/2012   LDLCALC 67 03/06/2012   TRIG 176* 03/06/2012   No results found for this basename: DDIMER     Radiology/Studies  Dg Chest 2 View  06/21/2013   CLINICAL DATA:  Defibrillator malfunction. CABG. Hypertension. Chest pain.  EXAM: CHEST  2 VIEW  COMPARISON:  06/12/2013  FINDINGS: Mild hyperinflation. Prior median sternotomy. Osteopenia. Mild lower thoracic vertebral body height loss. This is unchanged.  Pacer/AICD device. Leads at right atrium and right ventricle. No lead discontinuity. Midline trachea. Biapical pleural thickening. Mild cardiomegaly with atherosclerosis in the transverse aorta. No pleural effusion or pneumothorax. Interstitial thickening. Suspected at least 1 right upper lobe calcified granuloma.  IMPRESSION: 1. No evidence of pacer discontinuity. 2. COPD/chronic bronchitis and cardiomegaly. No acute superimposed process.   Electronically Signed   By: Jeronimo Greaves M.D.   On: 06/21/2013 13:02   Dg Chest 2 View  06/12/2013   CLINICAL DATA:  Problem of cardiac pacer  EXAM: CHEST  2 VIEW  COMPARISON:  02/09/2013  FINDINGS: Heart size within normal limits. Status post CABG. Dual lead cardiac pacer unchanged when compared to prior study. No infiltrate or effusion. Hyperinflation consistent with COPD is stable.  IMPRESSION: No acute finding   Electronically Signed   By: Esperanza Heir M.D.   On: 06/12/2013 21:13    ECG NSR, IVCD   ASSESSMENT  1. Septic shock in setting of HCAP and adrenal insufficiency 2. Lactic acidosis in the setting of septic shock 3. Chronic systolic HF with perhaps a small amount of pulm edema in setting of critical illness but do NOT suspect filling pressures are elevated above baseline. Physical exam and BNP support hypovolemia. BNP at [facility] was elevated at 915, but a prior in outpatient setting was 1329 (12/24/12)    4. NSTEMI, favor type II MI in setting of sepsis  PLAN 1. Shock in setting of HCAP and adrenal inusufficiency - ABX: vanv/zosyn for HCAP - Support BP with DPA for now, goal MAP: 65 - Gentle IVF bolus to wean dopa - Stress dose steroids: 50mg  q8H given he is on long term steroids - Monitor UOP (goal > 0.5cc/hr) and check for lactate clearance - Hold antihypertensives agents - f/u blood cx which were drawn at [facility]  2. Chronic Systolic HF  - Hold ARB, metoprolol, spironolactone in setting of septic shock - Hold diuretics - Will be gentle with IV fluid repletion  3. VT s/p ablation x 2 - Will need to hold both metoprolol and sotalol given shock - K>4, Mg>2 - Aim to limit DPA which is pro-arrhythmic  3. NSTEMI, favor type II - Will start UFH and ASA - Would defer formal ischemic eval unless he develops a large infarct or some other compelling evidence for Type I event  3. AF - continue warfarin - UFH bridge as above   Signed, Glori Luis, MD 07/03/2013, 8:29 PM  Critical care time = 60 minutes

## 2013-07-04 ENCOUNTER — Encounter (HOSPITAL_COMMUNITY): Payer: Self-pay | Admitting: Pulmonary Disease

## 2013-07-04 ENCOUNTER — Inpatient Hospital Stay (HOSPITAL_COMMUNITY): Payer: Medicare Other

## 2013-07-04 DIAGNOSIS — I519 Heart disease, unspecified: Secondary | ICD-10-CM

## 2013-07-04 DIAGNOSIS — J189 Pneumonia, unspecified organism: Secondary | ICD-10-CM

## 2013-07-04 DIAGNOSIS — A419 Sepsis, unspecified organism: Secondary | ICD-10-CM

## 2013-07-04 DIAGNOSIS — I5023 Acute on chronic systolic (congestive) heart failure: Secondary | ICD-10-CM

## 2013-07-04 LAB — CBC WITH DIFFERENTIAL/PLATELET
Basophils Relative: 0 % (ref 0–1)
Eosinophils Absolute: 0 10*3/uL (ref 0.0–0.7)
Eosinophils Relative: 0 % (ref 0–5)
HCT: 31.9 % — ABNORMAL LOW (ref 39.0–52.0)
Hemoglobin: 9.6 g/dL — ABNORMAL LOW (ref 13.0–17.0)
Lymphocytes Relative: 2 % — ABNORMAL LOW (ref 12–46)
Lymphs Abs: 0.5 10*3/uL — ABNORMAL LOW (ref 0.7–4.0)
MCH: 23.7 pg — ABNORMAL LOW (ref 26.0–34.0)
MCHC: 30.1 g/dL (ref 30.0–36.0)
MCV: 78.8 fL (ref 78.0–100.0)
Monocytes Absolute: 0.8 10*3/uL (ref 0.1–1.0)
Monocytes Relative: 4 % (ref 3–12)
RBC: 4.05 MIL/uL — ABNORMAL LOW (ref 4.22–5.81)

## 2013-07-04 LAB — LACTIC ACID, PLASMA
Lactic Acid, Venous: 3.4 mmol/L — ABNORMAL HIGH (ref 0.5–2.2)
Lactic Acid, Venous: 2.1 mmol/L (ref 0.5–2.2)

## 2013-07-04 LAB — COMPREHENSIVE METABOLIC PANEL
AST: 14 U/L (ref 0–37)
Albumin: 3 g/dL — ABNORMAL LOW (ref 3.5–5.2)
Albumin: 3 g/dL — ABNORMAL LOW (ref 3.5–5.2)
Alkaline Phosphatase: 45 U/L (ref 39–117)
Alkaline Phosphatase: 44 U/L (ref 39–117)
BUN: 17 mg/dL (ref 6–23)
BUN: 16 mg/dL (ref 6–23)
CO2: 23 mEq/L (ref 19–32)
CO2: 21 mEq/L (ref 19–32)
Chloride: 99 mEq/L (ref 96–112)
Creatinine, Ser: 1.16 mg/dL (ref 0.50–1.35)
GFR calc Af Amer: 66 mL/min — ABNORMAL LOW (ref >90)
Glucose, Bld: 125 mg/dL — ABNORMAL HIGH (ref 70–99)
Potassium: 4.6 mEq/L (ref 3.5–5.1)
Potassium: 4.2 mEq/L (ref 3.5–5.1)
Total Bilirubin: 0.5 mg/dL (ref 0.3–1.2)
Total Protein: 6.4 g/dL (ref 6.0–8.3)

## 2013-07-04 LAB — PRO B NATRIURETIC PEPTIDE: Pro B Natriuretic peptide (BNP): 9510 pg/mL — ABNORMAL HIGH (ref 0–450)

## 2013-07-04 LAB — DIGOXIN LEVEL: Digoxin Level: 1 ng/mL (ref 0.8–2.0)

## 2013-07-04 LAB — CBC
HCT: 28.6 % — ABNORMAL LOW (ref 39.0–52.0)
Platelets: 307 10*3/uL (ref 150–400)
RBC: 3.65 MIL/uL — ABNORMAL LOW (ref 4.22–5.81)
RDW: 18.4 % — ABNORMAL HIGH (ref 11.5–15.5)
WBC: 25.4 10*3/uL — ABNORMAL HIGH (ref 4.0–10.5)

## 2013-07-04 LAB — MAGNESIUM
Magnesium: 1.7 mg/dL (ref 1.5–2.5)
Magnesium: 1.7 mg/dL (ref 1.5–2.5)

## 2013-07-04 LAB — PROTIME-INR
INR: 1.96 — ABNORMAL HIGH (ref 0.00–1.49)
INR: 2.05 — ABNORMAL HIGH (ref 0.00–1.49)
Prothrombin Time: 21.7 seconds — ABNORMAL HIGH (ref 11.6–15.2)
Prothrombin Time: 22.5 seconds — ABNORMAL HIGH (ref 11.6–15.2)

## 2013-07-04 LAB — GLUCOSE, CAPILLARY
Glucose-Capillary: 153 mg/dL — ABNORMAL HIGH (ref 70–99)
Glucose-Capillary: 143 mg/dL — ABNORMAL HIGH (ref 70–99)

## 2013-07-04 LAB — PROCALCITONIN: Procalcitonin: 9.01 ng/mL

## 2013-07-04 LAB — TROPONIN I
Troponin I: <0.3 ng/mL (ref <0.30)
Troponin I: <0.3 ng/mL (ref <0.30)

## 2013-07-04 MED ORDER — SOTALOL HCL 80 MG PO TABS
160.0000 mg | ORAL_TABLET | Freq: Two times a day (BID) | ORAL | Status: DC
  Administered 2013-07-04 – 2013-07-12 (×17): 160 mg via ORAL
  Filled 2013-07-04 (×4): qty 2
  Filled 2013-07-04: qty 1
  Filled 2013-07-04 (×14): qty 2

## 2013-07-04 MED ORDER — SODIUM CHLORIDE 0.9 % IV SOLN: INTRAVENOUS | Status: AC

## 2013-07-04 MED ORDER — WARFARIN - PHYSICIAN DOSING INPATIENT
Freq: Every day | Status: DC
  Administered 2013-07-07: 18:00:00

## 2013-07-04 MED ORDER — MAGNESIUM SULFATE 40 MG/ML IJ SOLN
2.0000 g | Freq: Once | INTRAMUSCULAR | Status: AC
  Administered 2013-07-04: 2 g via INTRAVENOUS
  Filled 2013-07-04: qty 50

## 2013-07-04 MED ORDER — INSULIN ASPART 100 UNIT/ML ~~LOC~~ SOLN
0.0000 [IU] | Freq: Three times a day (TID) | SUBCUTANEOUS | Status: DC
  Administered 2013-07-04: 2 [IU] via SUBCUTANEOUS
  Administered 2013-07-04: 3 [IU] via SUBCUTANEOUS
  Administered 2013-07-05 – 2013-07-09 (×8): 2 [IU] via SUBCUTANEOUS
  Administered 2013-07-10: 17:00:00 3 [IU] via SUBCUTANEOUS
  Administered 2013-07-10 – 2013-07-12 (×2): 2 [IU] via SUBCUTANEOUS

## 2013-07-04 MED ORDER — INSULIN ASPART 100 UNIT/ML ~~LOC~~ SOLN: 0.0000 [IU] | Freq: Every day | SUBCUTANEOUS | Status: DC

## 2013-07-04 MED ORDER — HEPARIN (PORCINE) IN NACL 100-0.45 UNIT/ML-% IJ SOLN
900.0000 [IU]/h | INTRAMUSCULAR | Status: DC
  Administered 2013-07-04: 900 [IU]/h via INTRAVENOUS
  Filled 2013-07-04: qty 250

## 2013-07-04 MED ORDER — HYDROCORTISONE SOD SUCCINATE 100 MG IJ SOLR
50.0000 mg | Freq: Four times a day (QID) | INTRAMUSCULAR | Status: DC
  Administered 2013-07-04 – 2013-07-05 (×3): 50 mg via INTRAVENOUS
  Administered 2013-07-05: 05:00:00 via INTRAVENOUS
  Administered 2013-07-05 – 2013-07-06 (×3): 50 mg via INTRAVENOUS
  Filled 2013-07-04 (×11): qty 1

## 2013-07-04 NOTE — Progress Notes (Signed)
Utilization review completed. Niurka Benecke, RN, BSN. 

## 2013-07-04 NOTE — Evaluation (Addendum)
Clinical/Bedside Swallow Evaluation Patient Details  Name: [name] MRN: [id number] Date of Birth: April 16, 1929  Today's Date: 07/04/2013 Time: 1410-1440 SLP Time Calculation (min): 30 min  Past Medical History:  Past Medical History  Diagnosis Date  . Hypertension   . Coronary artery disease     a. CABG 03/2006. b. NSTEMI 03/2012 following VT likely demand ischemia - grafts patent at cath.  . Diabetes mellitus   . Ischemic cardiomyopathy     a. With chronic systolic CHF.  [name] Syncope and collapse   . Atrial fibrillation     a. Taking sotalol - intolerant to amiodarone. b. On Coumadin.  . Valvular heart disease     Mild to moderate mitral valve insufficiency and mild aortic insufficiency  . Chronic airway obstruction, not elsewhere classified   . Old myocardial infarction   . Other and unspecified hyperlipidemia   . Congestive heart failure, unspecified     a. Ischemic cardiomyopathy ejection fraction 30-35% previously. b. Down to 15% by cath 03/2012.  [name] Cerebrovascular disease, unspecified   . Other mechanical complication of other internal orthopedic device, implant, and graft   . Carotid artery disease     s/p LCEA 2007.  [name] Head and neck cancer     S/P radiation therapy. "cancer free"  . Orthostatic hypotension   . Ventricular tachycardia     a. S/p ICD implantation. b. Recurrent in 03/2012 with medication adjustment. c. s/p RFCA 11/2012 (Dr. Fawn Kirk) ; d. Recent adm 06/2013 for VT/ICD shock after failing med rx with sotalol, mexiletine and metoprolol - repeat ablation 06-23-2013 by Dr Johney Frame.  . Anemia     a. 06/2013 notes: "Given weight loss and iron deficiency, outpatient colonoscopy should be considered once more clinically stable."  . Hypoxia     a. Qualified for home O2 with ambulation during 06/2013 adm.  . Weight loss    Past Surgical History:  Past Surgical History  Procedure Laterality Date  . Repeat otif left knee with figure-of-eight tension band.    .  Implantation of a dual-chamber implantable  06/18/2007  . Left carotid endarterectomy    . Coronary artery bypass graft  04/04/2006  . Vt ablation  11/06/12; 06-23-2013    Ischemic VT ablation by Dr Johney Frame; repeat 06-23-2013 by Dr Johney Frame   HPI:  [name] is an 79 y.o. male w/ PMHx significant for CAD s/p CABG, iCM (EF 30-35% with moderate to severe MR on echo 5/14), DM2, paroxysmal afib, VT s/p ICD, s/p ablation x2 (last 06/23/13 with Dr. Johney Frame) who was recently discharged from [facility] on 11/21 who is admitted as a transfer from [facility] for respiratory failure. 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 Pt reported that he had a swallow study performed upon completion of XRT with no aspiration at that time. He shared that his difficulty swallowing started after his previous admission (d/c 06/26/13) for cardiac procedure, and that he is coughing whenever he tries to consume textures more solid than purees.   Assessment / Plan / Recommendation Clinical Impression  Pt presents with overt s/s of aspiration during bedside swallow evaluation, particularly with soft solid textures. Pt  exhibtied a baseline cough/throat clear, which was intermittently noted throughout trials. Vocal quality remained clear throughout trials prior to gentle throat clear; given pt report of reflux and thin liquids "coming back up," question reflux cough. However, graham cracker trial resulted in  immediate, overt coughing that resulted in oral expectoration of what appeared to be graham cracker and bloody sputum; RN made aware. Pt reports that he has been coughing on solid textures since his recent procedure earlier this month, and now presents with questionable PNA. Recommend to downgrade to Dys 1 textures with thin liquids until objective testing can be completed.    Aspiration Risk  Moderate    Diet Recommendation Dysphagia  1 (Puree);Thin liquid   Liquid Administration via: Cup;Straw;No straw Medication Administration: Whole meds with liquid Supervision: Patient able to self feed;Intermittent supervision to cue for compensatory strategies Compensations: Slow rate;Small sips/bites;Follow solids with liquid Postural Changes and/or Swallow Maneuvers: Seated upright 90 degrees;Upright 30-60 min after meal    Other  Recommendations Recommended Consults: MBS Oral Care Recommendations: Oral care BID   Follow Up Recommendations  Other (comment) (TBD pending objective testing)    Frequency and Duration        Pertinent Vitals/Pain N/A    SLP Swallow Goals TBD pending objective testing   Swallow Study Prior Functional Status       General Date of Onset:  (06/26/13) HPI: [name] is an 79 y.o. male w/ PMHx significant for CAD s/p CABG, iCM (EF 30-35% with moderate to severe MR on echo 5/14), DM2, paroxysmal afib, VT s/p ICD, s/p ablation x2 (last 06/23/13 with Dr. Johney Frame) who was recently discharged from [facility] on 11/21 who is admitted as a transfer from [facility] for respiratory failure. 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 Pt reported that he had a swallow study performed upon completion of XRT with no aspiration at that time. He shared that his difficulty swallowing started after his previous admission (d/c 06/26/13) for cardiac procedure, and that he is coughing whenever he tries to consume textures more solid than purees. Type of Study: Bedside swallow evaluation Previous Swallow Assessment: none in chart; see HPI for pt report Diet Prior to this Study: Regular;Thin liquids Temperature Spikes Noted: No Respiratory Status: Nasal cannula (2L) History of Recent Intubation: No Behavior/Cognition: Alert;Cooperative;Pleasant mood Oral Cavity - Dentition: Missing dentition Self-Feeding Abilities: Able  to feed self Patient Positioning: Upright in bed Baseline Vocal Quality: Other (comment) (rough quality, intermittent glottal fry; reports intermittent hoarseness since XRT, however this was not noted today) Volitional Cough: Strong Volitional Swallow: Able to elicit    Oral/Motor/Sensory Function Overall Oral Motor/Sensory Function: Appears within functional limits for tasks assessed   Ice Chips Ice chips: Not tested   Thin Liquid Thin Liquid: Impaired Presentation: Cup;Self Fed;Straw Pharyngeal  Phase Impairments: Throat Clearing - Delayed    Nectar Thick Nectar Thick Liquid: Not tested   Honey Thick Honey Thick Liquid: Not tested   Puree Puree: Impaired Presentation: Self Fed;Spoon Pharyngeal Phase Impairments: Throat Clearing - Delayed   Solid   GO    Solid: Impaired Presentation: Self Fed Pharyngeal Phase Impairments: Cough - Immediate;Other (comments) (pt orally expectorated a combination of what appaered to be graham cracker and bloody sputum)       Maxcine Ham, M.A. CCC-SLP [phone]  Maxcine Ham 07/04/2013,2:53 PM

## 2013-07-04 NOTE — Progress Notes (Addendum)
Patient ID: [name], male   DOB: 08/20/1928, 79 y.o.   MRN: [id number]   SUBJECTIVE: Breathing better today.  He remains on dopamine at 8 mcg/kg/min.  No chest pain.   [name] arformoterol  15 mcg Nebulization Q12H  . aspirin  81 mg Oral Daily  . budesonide  0.5 mg Nebulization BID  . digoxin  0.125 mg Oral Daily  . docusate sodium  100 mg Oral Daily  . ferrous sulfate  325 mg Oral BID WC  . hydrocortisone sodium succinate  50 mg Intravenous Q8H  . insulin aspart  0-15 Units Subcutaneous TID WC  . insulin aspart  0-5 Units Subcutaneous QHS  . loratadine  10 mg Oral Daily  . megestrol  400 mg Oral Daily  . montelukast  10 mg Oral QHS  . pantoprazole  40 mg Oral Daily  . piperacillin-tazobactam (ZOSYN)  IV  3.375 g Intravenous Q8H  . potassium chloride  10 mEq Oral Daily  . simvastatin  40 mg Oral QHS  . sodium chloride  3 mL Intravenous Q12H  . sotalol  160 mg Oral Q12H  . vancomycin  500 mg Intravenous Q12H  . warfarin  2.5 mg Oral QPC supper  . Warfarin - Physician Dosing Inpatient   Does not apply q1800  dopamine gtt @ 8    Filed Vitals:   07/04/13 0446 07/04/13 0500 07/04/13 0600 07/04/13 0700  BP:  121/48 117/42   Pulse:  83 78   Temp:    97.7 F (36.5 C)  TempSrc:    Oral  Resp:  22 17   Height:      Weight: 143 lb 8.3 oz (65.1 kg)     SpO2:  100% 100%     Intake/Output Summary (Last 24 hours) at 07/04/13 0741 Last data filed at 07/04/13 0600  Gross per 24 hour  Intake 945.88 ml  Output    975 ml  Net -29.12 ml    LABS: Basic Metabolic Panel:  Recent Labs  40/98/11 2314 07/04/13 0450  NA 136 135  K 4.6 4.2  CL 99 99  CO2 23 21  GLUCOSE 125* 151*  BUN 17 16  CREATININE 1.16 0.99  CALCIUM 9.2 8.9  MG 1.7 1.7   Liver Function Tests:  Recent Labs  07/03/13 2314 07/04/13 0450  AST 14 14  ALT 9 8  ALKPHOS 45 44  BILITOT 0.5 0.5  PROT 6.4 6.3  ALBUMIN 3.0* 3.0*   No results found for this basename: LIPASE, AMYLASE,  in the last 72  hours CBC:  Recent Labs  07/03/13 2314 07/04/13 0450  WBC 21.9* 25.4*  NEUTROABS 20.6*  --   HGB 9.6* 8.9*  HCT 31.9* 28.6*  MCV 78.8 78.4  PLT 290 307   Cardiac Enzymes:  Recent Labs  07/03/13 2314 07/04/13 0421  TROPONINI <0.30 <0.30   BNP: No components found with this basename: POCBNP,  D-Dimer: No results found for this basename: DDIMER,  in the last 72 hours Hemoglobin A1C: No results found for this basename: HGBA1C,  in the last 72 hours Fasting Lipid Panel: No results found for this basename: CHOL, HDL, LDLCALC, TRIG, CHOLHDL, LDLDIRECT,  in the last 72 hours Thyroid Function Tests: No results found for this basename: TSH, T4TOTAL, FREET3, T3FREE, THYROIDAB,  in the last 72 hours Anemia Panel: No results found for this basename: VITAMINB12, FOLATE, FERRITIN, TIBC, IRON, RETICCTPCT,  in the last 72 hours  RADIOLOGY: Dg Chest 2 View  06/21/2013  CLINICAL DATA:  Defibrillator malfunction. CABG. Hypertension. Chest pain.  EXAM: CHEST  2 VIEW  COMPARISON:  06/12/2013  FINDINGS: Mild hyperinflation. Prior median sternotomy. Osteopenia. Mild lower thoracic vertebral body height loss. This is unchanged.  Pacer/AICD device. Leads at right atrium and right ventricle. No lead discontinuity. Midline trachea. Biapical pleural thickening. Mild cardiomegaly with atherosclerosis in the transverse aorta. No pleural effusion or pneumothorax. Interstitial thickening. Suspected at least 1 right upper lobe calcified granuloma.  IMPRESSION: 1. No evidence of pacer discontinuity. 2. COPD/chronic bronchitis and cardiomegaly. No acute superimposed process.   Electronically Signed   By: Jeronimo Greaves M.D.   On: 06/21/2013 13:02   Dg Chest 2 View  06/12/2013   CLINICAL DATA:  Problem of cardiac pacer  EXAM: CHEST  2 VIEW  COMPARISON:  02/09/2013  FINDINGS: Heart size within normal limits. Status post CABG. Dual lead cardiac pacer unchanged when compared to prior study. No infiltrate or  effusion. Hyperinflation consistent with COPD is stable.  IMPRESSION: No acute finding   Electronically Signed   By: Esperanza Heir M.D.   On: 06/12/2013 21:13   Dg Chest Port 1 View  07/03/2013   CLINICAL DATA:  79 year old male with shortness of breath.  EXAM: PORTABLE CHEST - 1 VIEW  COMPARISON:  07/03/2013 and multiple prior chest radiographs  FINDINGS: Cardiomegaly, CABG changes and left-sided pacemaker/ICD again noted.  COPD/emphysema identified.  Decreased pulmonary edema and improved bibasilar aeration noted.  There is no evidence of pneumothorax or definite pleural effusion.  No acute bony abnormalities are present.  IMPRESSION: Decreased pulmonary edema with improved bibasilar aeration.  Cardiomegaly and COPD/emphysema.   Electronically Signed   By: Laveda Abbe M.D.   On: 07/03/2013 22:37    PHYSICAL EXAM General: NAD Neck: No JVD, no thyromegaly or thyroid nodule.  Lungs: Distant breath sounds with slight basilar crackles. CV: Nondisplaced PMI.  Heart regular S1/S2, no S3/S4, no murmur.  No peripheral edema.   Abdomen: Soft, nontender, no hepatosplenomegaly, no distention.  Neurologic: Alert and oriented x 3.  Psych: Normal affect. Extremities: No clubbing or cyanosis.   TELEMETRY: Reviewed telemetry pt in NSR  ASSESSMENT AND PLAN: 79 yo with history of CAD s/p CABG, VT s/p ICD and recent VT ablation on sotalol, ischemic cardiomyopathy, paroxysmal atrial fibrillation, and severe COPD on home oxygen presented yesterday to [facility] with respiratory failure and hypotension.  He was thought to have PNA with septic shock.  1. Hypotension: Patient remains on dopamine @ 8 today.  BP is stable.  JVP does not appear significantly elevated, septic shock may certainly be the driving force here.  CHF BP-active meds on hold for now.  Lactic acidosis has corrected this morning.  - Agree with 500 cc more NS with dopamine wean.  - On stress dose hydrocortisone (on prednisone at home), will continue.   - Will check procalcitonin level. 2. ID: ? PNA.  Apparently seen on CXR at [facility], CXR here not definitive.  WBCs quite high but on steroids.  Will check PCT.  Will get CXR PA/lateral today.  Continue vancomycin/Zosyn.  Will get CCM consultation.  Cultures presumably done at [facility] prior to antibiotics.  Will need to check for results.  3. CHF: Chronic systolic CHF, EF 91-47%.  He does not appear significantly volume overloaded today. Will give one more bolus IV fluid then would hold off. Will wean dopamine today, BP is better.  Other CHF meds on hold for now.  4. CAD s/p CABG: Troponin normal  here.  No chest pain.  5. Paroxysmal atrial fibrillation: He is on coumadin, INR near-therapeutic so can stop heparin.  NSR currently.  6. VT: VT ablation on 11/18.  Will restart sotalol this morning. Needs ECG.  7. Aspiration: Coughs with eating.  Will get swallow evaluation.   Marca Ancona 07/04/2013 7:48 AM

## 2013-07-04 NOTE — Progress Notes (Signed)
Nutrition Brief Note  Patient identified on the Malnutrition Screening Tool (MST) Report for weight loss. Patient reports a lot of weight loss over the past few years, but weight has been stable for the past 6-8 months. He has been eating well at home, has a good appetite, takes Megace.   Wt Readings from Last 15 Encounters:  07/04/13 143 lb 8.3 oz (65.1 kg)  06/26/13 139 lb (63.05 kg)  06/26/13 139 lb (63.05 kg)  06/14/13 143 lb 4.8 oz (65 kg)  04/03/13 144 lb (65.318 kg)  03/30/13 148 lb (67.132 kg)  01/28/13 144 lb (65.318 kg)  12/25/12 149 lb 12.8 oz (67.949 kg)  12/24/12 151 lb 12.8 oz (68.856 kg)  11/08/12 149 lb 11.1 oz (67.9 kg)  11/08/12 149 lb 11.1 oz (67.9 kg)  10/22/12 154 lb (69.854 kg)  09/15/12 162 lb (73.483 kg)  08/08/12 164 lb (74.39 kg)  08/01/12 163 lb (73.936 kg)    Body mass index is 21.18 kg/(m^2). Patient meets criteria for normal weight based on current BMI.   Current diet order is heart healthy, patient is consuming approximately >75% of meals at this time. Labs and medications reviewed.   No nutrition interventions warranted at this time. If nutrition issues arise, please consult RD.   Joaquin Courts, RD, LDN, CNSC Pager [phone] After Hours Pager [phone]

## 2013-07-04 NOTE — Consult Note (Signed)
PULMONARY  / CRITICAL CARE MEDICINE  Name: [name] MRN: [id number] DOB: 08-Feb-1929    ADMISSION DATE:  07/03/2013 CONSULTATION DATE:  07/04/13  REFERRING MD :  Dr. Shirlee Latch PRIMARY SERVICE:  Cardiology  CHIEF COMPLAINT:  Bilateral airspace disease / ? PNA  BRIEF PATIENT DESCRIPTION: 79 y/o M with PMH of CAD s/p CABG, ICM, severe MR with recent admit to [facility] (d/c 11/21) for VT.  Transferred to [facility] from [facility] for pulmonary edema / acute resp fx.  PCCM consulted for assistance.   SIGNIFICANT EVENTS / STUDIES:  11/18 - 11/21 Admit to [facility] with recurrent VT s/p ablation 11/18  ........................................................................................................[name] 11/28 - Tx to [facility] from [facility] for acute resp fx / pulmonary edema, hypotension   LINES / TUBES:   CULTURES: BCx2 11/28 ([place])>>>  ANTIBIOTICS: Zosyn 11/28>>> Vanco 11/28>>>  HISTORY OF PRESENT ILLNESS:  79 y/o M with PMH of HTN, CAD s/p CABG (2007), ICM (EF ~35%), chronic systolic CHF, Afib on coumadin / sotalol, severe MR, head / Neck CA with recent admit to [facility] (d/c 11/21) for recurrent VT requiring ablation on 11/18.  Discharged on oxygen & recommended rehab (pt refused inpatient rehab but is working with PT at home).  Presented to [facility] on 11/28 around 2PM - he reports he woke in the am in feeling 'normal' and then participated with PT at home.  Post activity, he developed acute onset dry hacking cough with progressive SOB, chills but no fever, progressive fatigue, and pleuritic chest pain.  Reported 3-4 lb weight loss since last discharge and compliance with oxygen therapy.  Weighs himself daily.    Transferred to [facility] from [facility] for pulmonary edema / acute resp fx.  Pt remains on 8 mcg of dopamine.  Has been treated with lasix but developed subsequent hypotension and required NS administration.  PCCM consulted for assistance.    PAST MEDICAL HISTORY :  Past Medical History   Diagnosis Date  . Hypertension   . Coronary artery disease     a. CABG 03/2006. b. NSTEMI 03/2012 following VT likely demand ischemia - grafts patent at cath.  . Diabetes mellitus   . Ischemic cardiomyopathy     a. With chronic systolic CHF.  [name] Syncope and collapse   . Atrial fibrillation     a. Taking sotalol - intolerant to amiodarone. b. On Coumadin.  . Valvular heart disease     Mild to moderate mitral valve insufficiency and mild aortic insufficiency  . Chronic airway obstruction, not elsewhere classified   . Old myocardial infarction   . Other and unspecified hyperlipidemia   . Congestive heart failure, unspecified     a. Ischemic cardiomyopathy ejection fraction 30-35% previously. b. Down to 15% by cath 03/2012.  [name] Cerebrovascular disease, unspecified   . Other mechanical complication of other internal orthopedic device, implant, and graft   . Carotid artery disease     s/p LCEA 2007.  [name] Head and neck cancer     Receiving radiation therapy.  . Orthostatic hypotension   . Ventricular tachycardia     a. S/p ICD implantation. b. Recurrent in 03/2012 with medication adjustment. c. s/p RFCA 11/2012 (Dr. Fawn Kirk) ; d. Recent adm 06/2013 for VT/ICD shock after failing med rx with sotalol, mexiletine and metoprolol - repeat ablation 06-23-2013 by Dr Johney Frame.  . Anemia     a. 06/2013 notes: "Given weight loss and iron deficiency, outpatient colonoscopy should be considered once more clinically stable."  . Hypoxia  a. Qualified for home O2 with ambulation during 06/2013 adm.  . Weight loss    Past Surgical History  Procedure Laterality Date  . Repeat otif left knee with figure-of-eight tension band.    . Implantation of a dual-chamber implantable  06/18/2007  . Left carotid endarterectomy    . Coronary artery bypass graft  04/04/2006  . Vt ablation  11/06/12; 06-23-2013    Ischemic VT ablation by Dr Johney Frame; repeat 06-23-2013 by Dr Johney Frame   Prior to Admission medications   Medication Sig  Start Date End Date Taking? Authorizing Provider  acetaminophen (TYLENOL) 500 MG tablet Take 1,000 mg by mouth 2 (two) times daily as needed (pain).    Historical Provider, MD  albuterol (PROVENTIL HFA;VENTOLIN HFA) 108 (90 BASE) MCG/ACT inhaler Inhale 2 puffs into the lungs every 6 (six) hours as needed for wheezing or shortness of breath.    Historical Provider, MD  budesonide (PULMICORT) 0.5 MG/2ML nebulizer solution Take 0.5 mg by nebulization 2 (two) times daily.    Historical Provider, MD  digoxin (LANOXIN) 0.125 MG tablet Take 1 tablet (0.125 mg total) by mouth daily. 01/28/13   Richarda Overlie, MD  docusate sodium 100 MG CAPS Take 100 mg by mouth daily. 06/26/13   Brooke O Edmisten, PA-C  ferrous sulfate 325 (65 FE) MG tablet Take 1 tablet (325 mg total) by mouth 2 (two) times daily with a meal. 06/26/13   Brooke O Edmisten, PA-C  formoterol (PERFOROMIST) 20 MCG/2ML nebulizer solution Take 20 mcg by nebulization 2 (two) times daily.    Historical Provider, MD  furosemide (LASIX) 40 MG tablet Take 1 tablet (40 mg total) by mouth daily. 03/30/13   Hillis Range, MD  loratadine (CLARITIN) 10 MG tablet Take 10 mg by mouth daily. For allergies    Historical Provider, MD  losartan (COZAAR) 25 MG tablet Take 0.5 tablets (12.5 mg total) by mouth daily. 01/28/13   Richarda Overlie, MD  megestrol (MEGACE) 40 MG/ML suspension Take 400 mg by mouth daily. 10 mls (2 teaspoonsful) 10/23/12   Rollene Rotunda, MD  metoprolol succinate (TOPROL-XL) 25 MG 24 hr tablet Take 1 tablet (25 mg total) by mouth daily. 03/30/13   Hillis Range, MD  montelukast (SINGULAIR) 10 MG tablet Take 10 mg by mouth at bedtime.  05/22/13   Historical Provider, MD  nitroGLYCERIN (NITROSTAT) 0.4 MG SL tablet Place 1 tablet (0.4 mg total) under the tongue every 5 (five) minutes as needed for chest pain. 11/26/11   June Leap, MD  omeprazole (PRILOSEC) 20 MG capsule Take 20 mg by mouth daily.    Historical Provider, MD  potassium chloride (K-DUR)  10 MEQ tablet Take 1 tablet (10 mEq total) by mouth daily. Take 2 tablets, total 20 mEq on Monday, Wednesday and Friday. 06/15/13   Rhonda G Barrett, PA-C  predniSONE (DELTASONE) 5 MG tablet Take 2.5 mg by mouth daily.  08/28/11   June Leap, MD  simvastatin (ZOCOR) 40 MG tablet Take 40 mg by mouth at bedtime.     Historical Provider, MD  sotalol (BETAPACE) 160 MG tablet Take 1 tablet (160 mg total) by mouth 2 (two) times daily. 06/15/13   Rhonda G Barrett, PA-C  spironolactone (ALDACTONE) 25 MG tablet Take 25 mg by mouth daily.    Historical Provider, MD  warfarin (COUMADIN) 5 MG tablet Take 0.5 tablets (2.5 mg total) by mouth at bedtime. 06/26/13   Herby Abraham Edmisten, PA-C   Allergies  Allergen Reactions  .  Amiodarone     Intolerant    FAMILY HISTORY:  Family History  Problem Relation Age of Onset  . CAD     SOCIAL HISTORY:  reports that he quit smoking about 22 years ago. His smoking use included Cigarettes. He has a 120 pack-year smoking history. He has never used smokeless tobacco. He reports that he does not drink alcohol or use illicit drugs.  REVIEW OF SYSTEMS:  See HPI.  Pertinent positives listed, all systems reviewed - otherwise negative.   SUBJECTIVE: Pt denies current acute complaints  VITAL SIGNS: Temp:  [96.3 F (35.7 C)-97.7 F (36.5 C)] 97.7 F (36.5 C) (11/29 0700) Pulse Rate:  [73-83] 80 (11/29 0900) Resp:  [11-26] 20 (11/29 0900) BP: (76-145)/(37-99) 100/49 mmHg (11/29 0900) SpO2:  [98 %-100 %] 98 % (11/29 0900) FiO2 (%):  [2 %-50 %] 2 % (11/29 0746) Weight:  [138 lb 14.2 oz (63 kg)-143 lb 8.3 oz (65.1 kg)] 143 lb 8.3 oz (65.1 kg) (11/29 0446)  PHYSICAL EXAMINATION: General:  wdwn elderly male in NAD Neuro:  AAOx4, speech clear, MAE HEENT:  Mm pink/moist, minimal jvd Cardiovascular:  s1s2 rrr, no murmur appreciated Lungs:  resp's even/non-labored, lungs bilaterally clear Abdomen:  Round/soft, bsx4 active Musculoskeletal:  No acute deformities, 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  EXAM: PORTABLE CHEST - 1 VIEW  COMPARISON:  07/03/2013 and multiple prior chest radiographs  FINDINGS: Cardiomegaly, CABG changes and left-sided pacemaker/ICD again noted.  COPD/emphysema identified.  Decreased pulmonary edema and improved bibasilar aeration noted.  There is no evidence of pneumothorax or definite pleural effusion.  No acute bony abnormalities are present.  IMPRESSION: Decreased pulmonary edema with improved bibasilar aeration.  Cardiomegaly and COPD/emphysema.   Electronically Signed   By: Laveda Abbe M.D.   On: 07/03/2013 22:37    ASSESSMENT / PLAN:  Pulmonary Edema vs Infiltrate - initial cxr from [facility] with concern for infiltrate.  Quickly cleared.   COPD / Empysema - followed by Dr. Orson Aloe in [facility] O2 Dependent - 2L at baseline since d/c 11/21  79 y/o M with recent admit for recurrent VT s/p ablation.  Abrupt onset SOB after PT at home.  Question if related to exertion / rate related event.  Subjective chills but no other infectious prodrome.  Elevated WBC but patient is on steroids.  CXR rapidly cleared which does not support infection but PCT is 9.  Of note also, is that patient has a very wide pulse pressure.  Plan: -monitor CXR trend -assess PCT  -continue brovana, pulmicort, singular -empiric abx for now, low threshold to narrow or d/c -Flutter Valve -has been on baseline steroids for years and has been weaned over past 6 months to 2.5 mg daily -2 View pending to assess airspace disease -oxygen at 2L (baseline)  Hypotension  Concern for Adrenal  Insufficiency - primary Pulm has been weaning steroids over past 6 mo CAD s/p CABG HTN ICM  AFib Valvular Heart Disease Recurrent VT s/p Ablation  Plan: -change titration of dopamine to SBP of 90 not a MAP of 60 as long as asymptomatic. -change hydrocortisone to 50 q6 (stress dose) -dopamine per Cardiology -Rate control per Cardiology  -lactate cleared, likely related to hypotension  -continue abx as ordered -f/u on cultures  Therapeutic Anticoagulation  Anemia   Plan: -continue coumadin per pharmacy  -continue Iron  Canary Brim, NP-C  Pulmonary & Critical Care Pgr: [phone] or [phone]  07/04/2013, 10:25 AM  Will continue dopamine but change titration for a SBP rather than MAP as above, increase dose of steroids, continue abx since PCT is elevated and f/fu on cultures.  If we have to place TLC then will check CVP.  CC time 35 min.  Patient seen and examined, agree with above note.  I dictated the care and orders written for this patient under my direction.  Alyson Reedy, MD [phone]

## 2013-07-05 ENCOUNTER — Inpatient Hospital Stay (HOSPITAL_COMMUNITY): Payer: Medicare Other

## 2013-07-05 DIAGNOSIS — J9600 Acute respiratory failure, unspecified whether with hypoxia or hypercapnia: Secondary | ICD-10-CM

## 2013-07-05 DIAGNOSIS — I509 Heart failure, unspecified: Secondary | ICD-10-CM

## 2013-07-05 LAB — BASIC METABOLIC PANEL
CO2: 23 mEq/L (ref 19–32)
Calcium: 9.2 mg/dL (ref 8.4–10.5)
Creatinine, Ser: 0.78 mg/dL (ref 0.50–1.35)
GFR calc non Af Amer: 82 mL/min — ABNORMAL LOW (ref >90)
Glucose, Bld: 130 mg/dL — ABNORMAL HIGH (ref 70–99)
Sodium: 137 mEq/L (ref 135–145)

## 2013-07-05 LAB — PROTIME-INR: Prothrombin Time: 26.5 seconds — ABNORMAL HIGH (ref 11.6–15.2)

## 2013-07-05 LAB — GLUCOSE, CAPILLARY
Glucose-Capillary: 145 mg/dL — ABNORMAL HIGH (ref 70–99)
Glucose-Capillary: 115 mg/dL — ABNORMAL HIGH (ref 70–99)

## 2013-07-05 LAB — CARBOXYHEMOGLOBIN
Carboxyhemoglobin: 1.8 % — ABNORMAL HIGH (ref 0.5–1.5)
Methemoglobin: 0.9 % (ref 0.0–1.5)
O2 Saturation: 52.8 %
Total hemoglobin: 7.2 g/dL — ABNORMAL LOW (ref 13.5–18.0)

## 2013-07-05 LAB — CBC
MCH: 24.1 pg — ABNORMAL LOW (ref 26.0–34.0)
MCHC: 30.9 g/dL (ref 30.0–36.0)
Platelets: 294 10*3/uL (ref 150–400)
RBC: 3.19 MIL/uL — ABNORMAL LOW (ref 4.22–5.81)
WBC: 18.9 10*3/uL — ABNORMAL HIGH (ref 4.0–10.5)

## 2013-07-05 LAB — PROCALCITONIN: Procalcitonin: 6.09 ng/mL

## 2013-07-05 MED ORDER — MAGNESIUM SULFATE 50 % IJ SOLN: 1.0000 g | Freq: Once | INTRAMUSCULAR | Status: DC

## 2013-07-05 MED ORDER — MAGNESIUM SULFATE IN D5W 10-5 MG/ML-% IV SOLN
1.0000 g | Freq: Once | INTRAVENOUS | Status: AC
  Administered 2013-07-05: 1 g via INTRAVENOUS
  Filled 2013-07-05: qty 100

## 2013-07-05 MED ORDER — FUROSEMIDE 10 MG/ML IJ SOLN
40.0000 mg | Freq: Once | INTRAMUSCULAR | Status: AC
  Administered 2013-07-05: 40 mg via INTRAVENOUS
  Filled 2013-07-05: qty 4

## 2013-07-05 NOTE — Procedures (Signed)
Central Venous Catheter Insertion Procedure Note [name] [id number] 1929-07-28  Procedure: Insertion of Central Venous Catheter Indications: Assessment of intravascular volume, Drug and/or fluid administration and Frequent blood sampling  Procedure Details Consent: Risks of procedure as well as the alternatives and risks of each were explained to the (patient/caregiver).  Consent for procedure obtained. Time Out: Verified patient identification, verified procedure, site/side was marked, verified correct patient position, special equipment/implants available, medications/allergies/relevent history reviewed, required imaging and test results available.  Performed  Maximum sterile technique was used including antiseptics, cap, gloves, gown, hand hygiene, mask and sheet. Skin prep: Chlorhexidine; local anesthetic administered A antimicrobial bonded/coated triple lumen catheter was placed in the right internal jugular vein using the Seldinger technique.  Evaluation Blood flow good Complications: No apparent complications Patient did tolerate procedure well. Chest X-ray ordered to verify placement.  CXR: normal.   Procedure performed under direct supervision of Dr. Molli Knock and with ultrasound guidance for real time vessel cannulation.  [name] Brim, NP-C [place] [facility] Pgr: [phone] or [phone]  07/05/2013, 4:32 PM  I was present and supervised the entire procedure.  U/S used in placement.  [name], M.D. [facility] Pulmonary/Critical Care Medicine. Pager: [phone]. After hours pager: [phone].

## 2013-07-05 NOTE — Progress Notes (Signed)
PULMONARY  / CRITICAL CARE MEDICINE  Name: [name] MRN: [id number] DOB: 1929-07-18    ADMISSION DATE:  07/03/2013 CONSULTATION DATE:  07/04/13  REFERRING MD :  Dr. Shirlee Latch PRIMARY SERVICE:  Cardiology  CHIEF COMPLAINT:  Bilateral airspace disease / ? PNA  BRIEF PATIENT DESCRIPTION: 79 y/o M with PMH of CAD s/p CABG, ICM, severe MR with recent admit to [facility] (d/c 11/21) for VT.  Transferred to [facility] from [facility] for pulmonary edema / acute resp fx.  PCCM consulted for assistance.   SIGNIFICANT EVENTS / STUDIES:  11/18 - 11/21 Admit to [facility] with recurrent VT s/p ablation 11/18  ........................................................................................................[name] 11/28 - Tx to [facility] from [facility] for acute resp fx / pulmonary edema, hypotension   LINES / TUBES:   CULTURES: BCx2 11/28 ([place])>>>  ANTIBIOTICS: Zosyn 11/28>>> Vanco 11/28>>>   SUBJECTIVE: c/o dyspnea  VITAL SIGNS: Temp:  [97 F (36.1 C)-97.6 F (36.4 C)] 97.6 F (36.4 C) (11/30 0400) Pulse Rate:  [75-88] 75 (11/30 0700) Resp:  [13-25] 21 (11/30 0700) BP: (81-124)/(42-84) 90/54 mmHg (11/30 0700) SpO2:  [97 %-100 %] 99 % (11/30 0700) Weight:  [66.7 kg (147 lb 0.8 oz)] 66.7 kg (147 lb 0.8 oz) (11/30 0400)  PHYSICAL EXAMINATION: General:  wdwn elderly male in NAD Neuro:  AAOx4, speech clear, MAE HEENT:  Mm pink/moist, minimal jvd Cardiovascular:  s1s2 rrr, no murmur appreciated Lungs:  resp's even/non-labored, rales, scattered rhonchi Abdomen:  Round/soft, bsx4 active Musculoskeletal:  No acute deformities, 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  EXAM: CHEST  2 VIEW  COMPARISON:  07/03/2013.  FINDINGS: Prior CABG. Cardiac pacer. No pulmonary venous congestion. Partial clearing of basilar pulmonary infiltrates. Persistent Kerley B lines and tiny pleural effusions noted. These findings suggest partial clearing of congestive heart failure. Superimposed basilar pneumonia cannot be excluded, particularly in the right lower lobe. Continued close follow-up chest x-rays recommended to demonstrate clearing.  IMPRESSION: 1. Findings suggesting partial clearing of congestive heart failure and pulmonary edema. 2. Superimposed pneumonia particularly in the right lung base cannot be excluded and continued close follow-up chest x-ray suggested.   Electronically Signed   By: Maisie Fus  Register   On: 07/04/2013 15:50   Dg Chest Port 1 View  07/05/2013   CLINICAL DATA:  Airspace disease  EXAM: PORTABLE CHEST - 1 VIEW  COMPARISON:  the previous day's study  FINDINGS: Previous CABG. Left subclavian AICD stable. Some worsening of perihilar and bibasilar alveolar opacities. Suspect left pleural effusion. The right costophrenic angle is excluded. Heart size upper limits normal. Atheromatous aorta.  IMPRESSION: 1.  Interval worsening of bilateral edema/ infiltrates.   Electronically Signed   By: Oley Balm M.D.   On: 07/05/2013 07:15   Dg Chest Port 1 View  07/03/2013   CLINICAL DATA:  79 year old male with shortness of breath.  EXAM: PORTABLE CHEST - 1 VIEW  COMPARISON:  07/03/2013 and multiple prior chest radiographs  FINDINGS: Cardiomegaly,  CABG changes and left-sided pacemaker/ICD again noted.  COPD/emphysema identified.  Decreased pulmonary edema and improved bibasilar aeration noted.  There is no evidence of pneumothorax or definite pleural effusion.  No acute bony abnormalities are present.  IMPRESSION: Decreased pulmonary edema with improved bibasilar aeration.  Cardiomegaly  and COPD/emphysema.   Electronically Signed   By: Laveda Abbe M.D.   On: 07/03/2013 22:37    ASSESSMENT / PLAN:  Pulmonary Edema vs Infiltrate - recurrent pulm edema, cannot r/o LL PNA  COPD / Empysema - followed by Dr. Orson Aloe in [facility] O2 Dependent - 2L at baseline since d/c 11/21 PCT elevated. Lactic acid did clear   Plan: -monitor CXR trend -continue brovana, pulmicort, singular -empiric abx for now, low threshold to narrow or d/c -Flutter Valve -has been on baseline steroids for years and has been weaned over past 6 months to 2.5 mg daily -2 View pending to assess airspace disease -oxygen at 2L (baseline)  Hypotension  Concern for Adrenal Insufficiency - primary Pulm has been weaning steroids over past 6 mo CAD s/p CABG HTN ICM  AFib Valvular Heart Disease Recurrent VT s/p Ablation  Plan: -conttitration of dopamine to SBP of 90 not a MAP of 60 as long as asymptomatic. -change hydrocortisone to 50 q6 (stress dose) -dopamine per Cardiology -Rate control per Cardiology  -lactate cleared, likely related to hypotension  -continue abx as ordered -f/u on cultures  Therapeutic Anticoagulation  Anemia   Plan: -continue coumadin per pharmacy  -continue Iron  Am ok with picc line, if cannot place today, ccm can place cvl 07/05/2013, 8:06 AM   CC [address] Storm Frisk, MD Beeper  [phone]  Cell  [phone]  If no response or cell goes to voicemail, call beeper [phone]

## 2013-07-05 NOTE — Procedures (Signed)
Objective Swallowing Evaluation: Modified Barium Swallowing Study  Patient Details  Name: [name] MRN: [id number] Date of Birth: Aug 25, 1928  Today's Date: 07/05/2013 Time: 1000-1029 SLP Time Calculation (min): 29 min  Past Medical History:  Past Medical History  Diagnosis Date  . Hypertension   . Coronary artery disease     a. CABG 03/2006. b. NSTEMI 03/2012 following VT likely demand ischemia - grafts patent at cath.  . Diabetes mellitus   . Ischemic cardiomyopathy     a. With chronic systolic CHF.  [name] Syncope and collapse   . Atrial fibrillation     a. Taking sotalol - intolerant to amiodarone. b. On Coumadin.  . Valvular heart disease     Mild to moderate mitral valve insufficiency and mild aortic insufficiency  . Chronic airway obstruction, not elsewhere classified   . Old myocardial infarction   . Other and unspecified hyperlipidemia   . Congestive heart failure, unspecified     a. Ischemic cardiomyopathy ejection fraction 30-35% previously. b. Down to 15% by cath 03/2012.  [name] Cerebrovascular disease, unspecified   . Other mechanical complication of other internal orthopedic device, implant, and graft   . Carotid artery disease     s/p LCEA 2007.  [name] Head and neck cancer     S/P radiation therapy. "cancer free"  . Orthostatic hypotension   . Ventricular tachycardia     a. S/p ICD implantation. b. Recurrent in 03/2012 with medication adjustment. c. s/p RFCA 11/2012 (Dr. Fawn Kirk) ; d. Recent adm 06/2013 for VT/ICD shock after failing med rx with sotalol, mexiletine and metoprolol - repeat ablation 06-23-2013 by Dr Johney Frame.  . Anemia     a. 06/2013 notes: "Given weight loss and iron deficiency, outpatient colonoscopy should be considered once more clinically stable."  . Hypoxia     a. Qualified for home O2 with ambulation during 06/2013 adm.  . Weight loss    Past Surgical History:  Past Surgical History  Procedure Laterality Date  . Repeat otif left knee with figure-of-eight  tension band.    . Implantation of a dual-chamber implantable  06/18/2007  . Left carotid endarterectomy    . Coronary artery bypass graft  04/04/2006  . Vt ablation  11/06/12; 06-23-2013    Ischemic VT ablation by Dr Johney Frame; repeat 06-23-2013 by Dr Johney Frame   HPI:  [name] is an 79 y.o. male w/ PMHx significant for CAD s/p CABG, iCM (EF 30-35% with moderate to severe MR on echo 5/14), DM2, paroxysmal afib, VT s/p ICD, s/p ablation x2 (last 06/23/13 with Dr. Johney Frame) who was recently discharged from [facility] on 11/21 who is admitted as a transfer from [facility] for respiratory failure. 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 Pt reported that he had a swallow study performed upon completion of XRT with no aspiration at that time. He shared that his difficulty swallowing started after his previous admission (d/c 06/26/13) for cardiac procedure, and that he is coughing whenever he tries to consume textures more solid than purees.  Pt underwent BSE on 11/29 and was placed on puree/thin diet, MBS indicated due to h/o head and neck cancer tx with likely decreased sensorimotor abilities.       Assessment / Plan / Recommendation Clinical Impression  Dysphagia Diagnosis: Mild oral phase dysphagia;Moderate oral phase dysphagia;Severe oral phase dysphagia   Clinical impression: Mild oral and moderately severe pharyngeal sensorimotor dysphagia -  suspected due to radiation effects from throat cancer.  Pt did not aspirate with any consistency tested and minimal upper laryngeal penetration of thin liquids noted.   Oral pressure decreased and exacerbated by pt's xerostomia.  Pharyngeal swallow characterized by decreased tongue base retraction (? small epiglottic size due to radiation), decreased pharyngeal contraction, laryngeal elevation/closure resulting in stasis throughout pharynx WITHOUT pt awareness.  Entire  masticated cracker was retained in vallecular space without pt awareness!  Chin tuck posture not effective to decrease stasis.  Did not attempt breathhold (Mendohlson) manuever due to a few runs of vtach this am per RN.  Dry swallows helpful to decrease liquid stasis and liquid swallows helpful to decrease solid stasis.    Suspect acute on chronic dysphagia for which pt does not sense stasis and hopeful for decrease in symptoms with strict compensation strategy compliance.  Pt reports better tolerance of pureed diet and is ok to continue diet for now.    SLP to follow for skilled intervention, today using video and verbal feedback as well as teachback method pt was thoroughly educated to precautions with reinforcement of effective strategies.      Treatment Recommendation  Therapy as outlined in treatment plan below    Diet Recommendation Dysphagia 1 (Puree);Thin liquid   Liquid Administration via: Cup;No straw Medication Administration: Whole meds with puree (start with liquids and follow with liquids) Supervision: Patient able to self feed;Intermittent supervision to cue for compensatory strategies Compensations: Slow rate;Small sips/bites;Follow solids with liquid;Multiple dry swallows after each bite/sip Postural Changes and/or Swallow Maneuvers: Seated upright 90 degrees;Upright 30-60 min after meal    Other  Recommendations Oral Care Recommendations: Oral care BID (xerostomia noted)   Follow Up Recommendations  Home health SLP    Frequency and Duration min 2x/week  2 weeks   Pertinent Vitals/Pain Afebrile, decreased    SLP Swallow Goals     General Date of Onset:  (06/26/13) HPI: [name] is an 79 y.o. male w/ PMHx significant for CAD s/p CABG, iCM (EF 30-35% with moderate to severe MR on echo 5/14), DM2, paroxysmal afib, VT s/p ICD, s/p ablation x2 (last 06/23/13 with Dr. Johney Frame) who was recently discharged from [facility] on 11/21 who is admitted as a transfer from [facility]  for respiratory failure. 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 Pt reported that he had a swallow study performed upon completion of XRT with no aspiration at that time. He shared that his difficulty swallowing started after his previous admission (d/c 06/26/13) for cardiac procedure, and that he is coughing whenever he tries to consume textures more solid than purees.  Pt underwent BSE on 11/29 and was placed on puree/thin diet, MBS indicated due to h/o head and neck cancer tx with likely decreased sensorimotor abilities.   Type of Study: Modified Barium Swallowing Study Reason for Referral: Objectively evaluate swallowing function Previous Swallow Assessment: none in chart; see HPI for pt report Diet Prior to this Study: Dysphagia 1 (puree);Thin liquids Respiratory Status: Nasal cannula (2L) History of Recent Intubation: No Behavior/Cognition: Alert;Cooperative;Pleasant mood Oral Cavity - Dentition: Missing dentition (dentures) Oral Motor / Sensory Function: Impaired - see Bedside swallow eval (? left labial asymmetry, pt reports been that way his whole life) Self-Feeding Abilities: Able to feed self Patient Positioning: Upright in chair Baseline Vocal Quality: Other (comment) (rough quality, intermittent glottal fry; reports intermittent hoarseness since XRT, however this was  not noted today) Volitional Cough: Strong Volitional Swallow: Able to elicit Anatomy: Other (Comment) (appearance of small epiglottis, ? d/t radiation tx) Pharyngeal Secretions:  (pt xerostomic due to radiation tx)    Reason for Referral Objectively evaluate swallowing function   Oral Phase Oral Preparation/Oral Phase Oral Phase: Impaired Oral - Nectar Oral - Nectar Teaspoon: Weak lingual manipulation;Reduced posterior propulsion Oral - Nectar Cup: Weak lingual manipulation;Reduced posterior propulsion Oral -  Thin Oral - Thin Teaspoon: Weak lingual manipulation;Reduced posterior propulsion Oral - Thin Cup: Weak lingual manipulation;Reduced posterior propulsion Oral - Solids Oral - Puree: Weak lingual manipulation;Reduced posterior propulsion Oral - Regular: Weak lingual manipulation;Reduced posterior propulsion Oral - Pill: Weak lingual manipulation;Reduced posterior propulsion   Pharyngeal Phase Pharyngeal Phase Pharyngeal Phase: Impaired Pharyngeal - Nectar Pharyngeal - Nectar Teaspoon: Reduced anterior laryngeal mobility;Reduced laryngeal elevation;Reduced airway/laryngeal closure;Reduced tongue base retraction;Pharyngeal residue - valleculae;Pharyngeal residue - pyriform sinuses Pharyngeal - Nectar Cup: Reduced laryngeal elevation;Reduced anterior laryngeal mobility;Reduced epiglottic inversion;Reduced pharyngeal peristalsis;Reduced airway/laryngeal closure;Reduced tongue base retraction Pharyngeal - Thin Pharyngeal - Thin Teaspoon: Reduced pharyngeal peristalsis;Reduced epiglottic inversion;Reduced anterior laryngeal mobility;Reduced laryngeal elevation;Reduced airway/laryngeal closure;Reduced tongue base retraction;Penetration/Aspiration during swallow Penetration/Aspiration details (thin teaspoon): Material enters airway, remains ABOVE vocal cords and not ejected out Pharyngeal - Thin Cup: Reduced anterior laryngeal mobility;Reduced epiglottic inversion;Reduced pharyngeal peristalsis;Reduced laryngeal elevation;Reduced airway/laryngeal closure;Reduced tongue base retraction;Penetration/Aspiration during swallow Penetration/Aspiration details (thin cup): Material enters airway, remains ABOVE vocal cords and not ejected out Pharyngeal - Solids Pharyngeal - Puree: Reduced pharyngeal peristalsis;Reduced epiglottic inversion;Reduced anterior laryngeal mobility;Reduced laryngeal elevation;Reduced airway/laryngeal closure;Reduced tongue base retraction;Pharyngeal residue - valleculae;Pharyngeal residue  - pyriform sinuses;Lateral channel residue;Premature spillage to valleculae Pharyngeal - Regular: Premature spillage to valleculae;Reduced anterior laryngeal mobility;Reduced epiglottic inversion;Reduced pharyngeal peristalsis;Reduced laryngeal elevation;Reduced airway/laryngeal closure;Reduced tongue base retraction;Pharyngeal residue - valleculae (entire cracker bolus was retained in pharynx without pt awareness, dry swallow ineffective, liquid swallow helpful) Pharyngeal - Pill: Premature spillage to valleculae;Reduced pharyngeal peristalsis;Reduced epiglottic inversion;Reduced anterior laryngeal mobility;Reduced laryngeal elevation;Reduced airway/laryngeal closure;Reduced tongue base retraction;Pharyngeal residue - valleculae Pharyngeal Phase - Comment Pharyngeal Comment: chin tuck posture not effective, did not attempt breathhold (M[place]ohlson) manuever due to a few runs of vtach this am per RN, dry swallows helpful to decrease liquid stasis, liquids helpful to decrease solid stasis, pt does not sense stasis due to radiation tx effects  Cervical Esophageal Phase    GO    Cervical Esophageal Phase Cervical Esophageal Phase: Impaired (appearance of minimal delayed clearance with thicker consistencies, liquid swallows aided clearance,  minimal - radiologist not present to confirm)         Donavan Burnet, MS [facility] SLP [phone]

## 2013-07-05 NOTE — Progress Notes (Addendum)
Patient ID: [name], male   DOB: [birth year], 79 y.o.   MRN: [id number]   SUBJECTIVE: Coughing, some hemoptysis.  Still some dyspnea. No fever.   [name] arformoterol  15 mcg Nebulization Q12H  . aspirin  81 mg Oral Daily  . budesonide  0.5 mg Nebulization BID  . digoxin  0.125 mg Oral Daily  . docusate sodium  100 mg Oral Daily  . ferrous sulfate  325 mg Oral BID WC  . furosemide  40 mg Intravenous Once  . hydrocortisone sodium succinate  50 mg Intravenous Q6H  . insulin aspart  0-15 Units Subcutaneous TID WC  . insulin aspart  0-5 Units Subcutaneous QHS  . loratadine  10 mg Oral Daily  . megestrol  400 mg Oral Daily  . montelukast  10 mg Oral QHS  . pantoprazole  40 mg Oral Daily  . piperacillin-tazobactam (ZOSYN)  IV  3.375 g Intravenous Q8H  . potassium chloride  10 mEq Oral Daily  . simvastatin  40 mg Oral QHS  . sodium chloride  3 mL Intravenous Q12H  . sotalol  160 mg Oral Q12H  . vancomycin  500 mg Intravenous Q12H  . warfarin  2.5 mg Oral QPC supper  . Warfarin - Physician Dosing Inpatient   Does not apply q1800  dopamine gtt @ 6    Filed Vitals:   07/05/13 0500 07/05/13 0519 07/05/13 0600 07/05/13 0700  BP: 106/48  103/47 90/54  Pulse: 80  83 75  Temp:      TempSrc:      Resp: 25  22 21   Height:      Weight:      SpO2: 100% 100% 99% 99%    Intake/Output Summary (Last 24 hours) at 07/05/13 0732 Last data filed at 07/05/13 0700  Gross per 24 hour  Intake 1664.24 ml  Output   2650 ml  Net -985.76 ml    LABS: Basic Metabolic Panel:  Recent Labs  40/98/11 2314 07/04/13 0450  NA 136 135  K 4.6 4.2  CL 99 99  CO2 23 21  GLUCOSE 125* 151*  BUN 17 16  CREATININE 1.16 0.99  CALCIUM 9.2 8.9  MG 1.7 1.7   Liver Function Tests:  Recent Labs  07/03/13 2314 07/04/13 0450  AST 14 14  ALT 9 8  ALKPHOS 45 44  BILITOT 0.5 0.5  PROT 6.4 6.3  ALBUMIN 3.0* 3.0*   No results found for this basename: LIPASE, AMYLASE,  in the last 72  hours CBC:  Recent Labs  07/03/13 2314 07/04/13 0450 07/05/13 0502  WBC 21.9* 25.4* 18.9*  NEUTROABS 20.6*  --   --   HGB 9.6* 8.9* 7.7*  HCT 31.9* 28.6* 24.9*  MCV 78.8 78.4 78.1  PLT 290 307 294   Cardiac Enzymes:  Recent Labs  07/03/13 2314 07/04/13 0421 07/04/13 1334  TROPONINI <0.30 <0.30 <0.30   BNP: No components found with this basename: POCBNP,  D-Dimer: No results found for this basename: DDIMER,  in the last 72 hours Hemoglobin A1C: No results found for this basename: HGBA1C,  in the last 72 hours Fasting Lipid Panel: No results found for this basename: CHOL, HDL, LDLCALC, TRIG, CHOLHDL, LDLDIRECT,  in the last 72 hours Thyroid Function Tests: No results found for this basename: TSH, T4TOTAL, FREET3, T3FREE, THYROIDAB,  in the last 72 hours Anemia Panel: No results found for this basename: VITAMINB12, FOLATE, FERRITIN, TIBC, IRON, RETICCTPCT,  in the last 72 hours  RADIOLOGY:  Dg Chest 2 View  06/21/2013   CLINICAL DATA:  Defibrillator malfunction. CABG. Hypertension. Chest pain.  EXAM: CHEST  2 VIEW  COMPARISON:  06/12/2013  FINDINGS: Mild hyperinflation. Prior median sternotomy. Osteopenia. Mild lower thoracic vertebral body height loss. This is unchanged.  Pacer/AICD device. Leads at right atrium and right ventricle. No lead discontinuity. Midline trachea. Biapical pleural thickening. Mild cardiomegaly with atherosclerosis in the transverse aorta. No pleural effusion or pneumothorax. Interstitial thickening. Suspected at least 1 right upper lobe calcified granuloma.  IMPRESSION: 1. No evidence of pacer discontinuity. 2. COPD/chronic bronchitis and cardiomegaly. No acute superimposed process.   Electronically Signed   By: Jeronimo Greaves M.D.   On: 06/21/2013 13:02   Dg Chest 2 View  06/12/2013   CLINICAL DATA:  Problem of cardiac pacer  EXAM: CHEST  2 VIEW  COMPARISON:  02/09/2013  FINDINGS: Heart size within normal limits. Status post CABG. Dual lead cardiac  pacer unchanged when compared to prior study. No infiltrate or effusion. Hyperinflation consistent with COPD is stable.  IMPRESSION: No acute finding   Electronically Signed   By: Esperanza Heir M.D.   On: 06/12/2013 21:13   Dg Chest Port 1 View  07/03/2013   CLINICAL DATA:  79 year old male with shortness of breath.  EXAM: PORTABLE CHEST - 1 VIEW  COMPARISON:  07/03/2013 and multiple prior chest radiographs  FINDINGS: Cardiomegaly, CABG changes and left-sided pacemaker/ICD again noted.  COPD/emphysema identified.  Decreased pulmonary edema and improved bibasilar aeration noted.  There is no evidence of pneumothorax or definite pleural effusion.  No acute bony abnormalities are present.  IMPRESSION: Decreased pulmonary edema with improved bibasilar aeration.  Cardiomegaly and COPD/emphysema.   Electronically Signed   By: Laveda Abbe M.D.   On: 07/03/2013 22:37    PHYSICAL EXAM General: NAD Neck: JVP 8-9 cm, no thyromegaly or thyroid nodule.  Lungs: Distant breath sounds with slight basilar crackles. CV: Nondisplaced PMI.  Heart regular S1/S2, no S3/S4, no murmur.  No peripheral edema.   Abdomen: Soft, nontender, no hepatosplenomegaly, no distention.  Neurologic: Alert and oriented x 3.  Psych: Normal affect. Extremities: No clubbing or cyanosis.   TELEMETRY: Reviewed telemetry pt in NSR  ASSESSMENT AND PLAN: 79 yo with history of CAD s/p CABG, VT s/p ICD and recent VT ablation on sotalol, ischemic cardiomyopathy, paroxysmal atrial fibrillation, and severe COPD on home oxygen presented to [facility] with respiratory failure and hypotension.  He was thought to have PNA with septic shock. He was transferred to [facility]  1. Hypotension: Patient remains on dopamine @ 6 today.  BP is stable.  Septic shock may certainly be the driving force here but he also has known ischemic cardiomyopathy so suspect this plays a role as well.  CHF BP-active meds on hold for now.  Lactic acidosis has corrected yesterday.  JVP  appears mildly elevated today and there was some pulmonary edema on CXR.  - Can wean dopamine for SBP >/= 90  - On stress dose hydrocortisone (on prednisone at home), will continue.  - Procalcitonin level high yesterday, awaiting repeat today.  - Lasix 40 mg IV x 1, follow response.  - Will place PICC for CVP/co-ox monitoring.  2. ID: ? PNA.  CXR here showed possible RLL infiltrate on 2-view yesterday but also with pulmonary edema.  Some hemoptysis overnight.  WBCs quite high but on steroids (down this morning).  PCT high at 9 yesterday, repeat this morning.   Continue vancomycin/Zosyn.  Cultures presumably done at  [facility] prior to antibiotics.  Will need to check for results.  3. CHF: Chronic systolic CHF, EF 40-98%.  Volume up.  Toprol, losartan, and spironolactone on hold.  Some volume overload, will get dose of Lasix 40 mg IV x 1 today.  Getting PICC for CVP/co-ox. 4. CAD s/p CABG: Troponin normal here.  No chest pain.  5. Paroxysmal atrial fibrillation: He is on coumadin, INR therapeutic.  NSR currently.  6. VT: VT ablation on 11/18.  Continue sotalol.  QT ok so far on ECG.  Repeat this morning.  7. Aspiration: Coughs with eating.  Getting swallow evaluation.    Marca Ancona 07/05/2013 7:32 AM  Patient went into atrial fibrillation with RVR this afternoon.  BP has improved, so I stopped dopamine.  HR decreased to 90s-100s.  He will get a dose of sotalol later today.  If he does not convert back to NSR on his own, may need cardioversion.  Will need to go back over INRs to see if TEE would be required.   Marca Ancona 07/05/2013 3:39 PM

## 2013-07-05 NOTE — Progress Notes (Signed)
Peripherally Inserted Central Catheter/Midline Placement  The IV Nurse has discussed with the patient and/or persons authorized to consent for the patient, the purpose of this procedure and the potential benefits and risks involved with this procedure.  The benefits include less needle sticks, lab draws from the catheter and patient may be discharged home with the catheter.  Risks include, but not limited to, infection, bleeding, blood clot (thrombus formation), and puncture of an artery; nerve damage and irregular heat beat.  Alternatives to this procedure were also discussed.  PICC/Midline Placement Documentation    Attempted x3 to place PICC.  Unable to thread centrally meeting some type of obstruction.    Ethelda Chick 07/05/2013, 3:01 PM

## 2013-07-05 NOTE — Progress Notes (Signed)
Pt called out feeling SOB and HR noted to be 130's.  Afib.  Dr. Shirlee Latch paged to come see.  Decision made for central line placement and dopamine shut off. Maintaining bp 135/83.  HR 90's -100.  Admits to feeling "tight". Lungs clear. At bedside explaining central line.

## 2013-07-06 DIAGNOSIS — I4891 Unspecified atrial fibrillation: Secondary | ICD-10-CM

## 2013-07-06 DIAGNOSIS — I2510 Atherosclerotic heart disease of native coronary artery without angina pectoris: Secondary | ICD-10-CM

## 2013-07-06 DIAGNOSIS — I472 Ventricular tachycardia: Secondary | ICD-10-CM

## 2013-07-06 DIAGNOSIS — Z7901 Long term (current) use of anticoagulants: Secondary | ICD-10-CM

## 2013-07-06 DIAGNOSIS — D649 Anemia, unspecified: Secondary | ICD-10-CM

## 2013-07-06 DIAGNOSIS — Z95810 Presence of automatic (implantable) cardiac defibrillator: Secondary | ICD-10-CM

## 2013-07-06 LAB — CBC
HCT: 29.1 % — ABNORMAL LOW (ref 39.0–52.0)
Hemoglobin: 6.6 g/dL — CL (ref 13.0–17.0)
Hemoglobin: 8.8 g/dL — ABNORMAL LOW (ref 13.0–17.0)
MCH: 23.6 pg — ABNORMAL LOW (ref 26.0–34.0)
MCHC: 29.7 g/dL — ABNORMAL LOW (ref 30.0–36.0)
MCHC: 30.2 g/dL (ref 30.0–36.0)
Platelets: 264 10*3/uL (ref 150–400)
RBC: 2.8 MIL/uL — ABNORMAL LOW (ref 4.22–5.81)
RBC: 3.61 MIL/uL — ABNORMAL LOW (ref 4.22–5.81)
RDW: 18.7 % — ABNORMAL HIGH (ref 11.5–15.5)
WBC: 18.3 10*3/uL — ABNORMAL HIGH (ref 4.0–10.5)

## 2013-07-06 LAB — BASIC METABOLIC PANEL
BUN: 20 mg/dL (ref 6–23)
Calcium: 8.5 mg/dL (ref 8.4–10.5)
GFR calc Af Amer: >90 mL/min (ref >90)
GFR calc non Af Amer: 86 mL/min — ABNORMAL LOW (ref >90)
Glucose, Bld: 114 mg/dL — ABNORMAL HIGH (ref 70–99)
Potassium: 3.5 mEq/L (ref 3.5–5.1)
Sodium: 139 mEq/L (ref 135–145)

## 2013-07-06 LAB — CARBOXYHEMOGLOBIN
Carboxyhemoglobin: 2.5 % — ABNORMAL HIGH (ref 0.5–1.5)
Methemoglobin: 1 % (ref 0.0–1.5)
O2 Saturation: 70 %
Total hemoglobin: 6.6 g/dL — CL (ref 13.5–18.0)

## 2013-07-06 LAB — PROCALCITONIN: Procalcitonin: 3.58 ng/mL

## 2013-07-06 LAB — PREPARE RBC (CROSSMATCH)

## 2013-07-06 LAB — GLUCOSE, CAPILLARY
Glucose-Capillary: 101 mg/dL — ABNORMAL HIGH (ref 70–99)
Glucose-Capillary: 112 mg/dL — ABNORMAL HIGH (ref 70–99)
Glucose-Capillary: 119 mg/dL — ABNORMAL HIGH (ref 70–99)

## 2013-07-06 LAB — MAGNESIUM: Magnesium: 2.4 mg/dL (ref 1.5–2.5)

## 2013-07-06 MED ORDER — DEXTROSE 5 % IV SOLN
1.0000 g | INTRAVENOUS | Status: AC
  Administered 2013-07-06 – 2013-07-10 (×5): 1 g via INTRAVENOUS
  Filled 2013-07-06 (×5): qty 10

## 2013-07-06 MED ORDER — FUROSEMIDE 10 MG/ML IJ SOLN
40.0000 mg | Freq: Once | INTRAMUSCULAR | Status: AC
  Administered 2013-07-06: 40 mg via INTRAVENOUS
  Filled 2013-07-06: qty 4

## 2013-07-06 MED ORDER — HYDROCORTISONE SOD SUCCINATE 100 MG IJ SOLR
25.0000 mg | Freq: Four times a day (QID) | INTRAMUSCULAR | Status: DC
  Administered 2013-07-06 – 2013-07-07 (×4): 25 mg via INTRAVENOUS
  Filled 2013-07-06 (×8): qty 0.5

## 2013-07-06 MED FILL — Dopamine in Dextrose 5% Inj 3.2 MG/ML: INTRAVENOUS | Qty: 250 | Status: AC

## 2013-07-06 NOTE — Progress Notes (Addendum)
Patient ID: [name], male   DOB: [birth year], 79 y.o.   MRN: [id number]   SUBJECTIVE:   Remains SOB with cough.  Denies CP or ICD shocks.  [name] arformoterol  15 mcg Nebulization Q12H  . aspirin  81 mg Oral Daily  . budesonide  0.5 mg Nebulization BID  . digoxin  0.125 mg Oral Daily  . docusate sodium  100 mg Oral Daily  . ferrous sulfate  325 mg Oral BID WC  . hydrocortisone sodium succinate  50 mg Intravenous Q6H  . insulin aspart  0-15 Units Subcutaneous TID WC  . insulin aspart  0-5 Units Subcutaneous QHS  . loratadine  10 mg Oral Daily  . megestrol  400 mg Oral Daily  . montelukast  10 mg Oral QHS  . pantoprazole  40 mg Oral Daily  . piperacillin-tazobactam (ZOSYN)  IV  3.375 g Intravenous Q8H  . potassium chloride  10 mEq Oral Daily  . simvastatin  40 mg Oral QHS  . sodium chloride  3 mL Intravenous Q12H  . sotalol  160 mg Oral Q12H  . vancomycin  500 mg Intravenous Q12H  . warfarin  2.5 mg Oral QPC supper  . Warfarin - Physician Dosing Inpatient   Does not apply q1800  dopamine gtt @ 6    Filed Vitals:   07/05/13 2200 07/05/13 2300 07/06/13 0403 07/06/13 0700  BP: 107/41 107/40  100/46  Pulse: 76 77  81  Temp:   97.6 F (36.4 C)   TempSrc:   Oral   Resp:      Height:      Weight:   147 lb 14.9 oz (67.1 kg)   SpO2: 100% 100%  100%    Intake/Output Summary (Last 24 hours) at 07/06/13 0827 Last data filed at 07/06/13 0700  Gross per 24 hour  Intake  729.1 ml  Output   1100 ml  Net -370.9 ml    LABS: Basic Metabolic Panel:  Recent Labs  78/29/56 0502 07/06/13 0500  NA 137 139  K 4.4 3.5  CL 102 104  CO2 23 27  GLUCOSE 130* 114*  BUN 18 20  CREATININE 0.78 0.70  CALCIUM 9.2 8.5  MG 2.4 2.4   Liver Function Tests:  Recent Labs  07/03/13 2314 07/04/13 0450  AST 14 14  ALT 9 8  ALKPHOS 45 44  BILITOT 0.5 0.5  PROT 6.4 6.3  ALBUMIN 3.0* 3.0*   No results found for this basename: LIPASE, AMYLASE,  in the last 72 hours CBC:  Recent  Labs  07/03/13 2314  07/05/13 0502 07/06/13 0500  WBC 21.9*  < > 18.9* 12.8*  NEUTROABS 20.6*  --   --   --   HGB 9.6*  < > 7.7* 6.6*  HCT 31.9*  < > 24.9* 22.2*  MCV 78.8  < > 78.1 79.3  PLT 290  < > 294 264  < > = values in this interval not displayed. Cardiac Enzymes:  Recent Labs  07/03/13 2314 07/04/13 0421 07/04/13 1334  TROPONINI <0.30 <0.30 <0.30    RADIOLOGY:  Dg Chest Port 1 View  07/03/2013   CLINICAL DATA:  79 year old male with shortness of breath.  EXAM: PORTABLE CHEST - 1 VIEW  COMPARISON:  07/03/2013 and multiple prior chest radiographs  FINDINGS: Cardiomegaly, CABG changes and left-sided pacemaker/ICD again noted.  COPD/emphysema identified.  Decreased pulmonary edema and improved bibasilar aeration noted.  There is no evidence of pneumothorax or definite pleural effusion.  No acute bony abnormalities are present.  IMPRESSION: Decreased pulmonary edema with improved bibasilar aeration.  Cardiomegaly and COPD/emphysema.   Electronically Signed   By: Laveda Abbe M.D.   On: 07/03/2013 22:37    PHYSICAL EXAM General: NAD, ill appearing HEENT: OP clear Neck: JVP 8-9 cm, R CVL in place Lungs: Distant breath sounds with slight basilar crackles. CV: Nondisplaced PMI.  Heart regular S1/S2, no S3/S4, no murmur.  No peripheral edema.   Abdomen: Soft, nontender, no hepatosplenomegaly, no distention.  Neurologic: Alert and oriented x 3.  Psych: Normal affect. Extremities: No clubbing or cyanosis.   TELEMETRY: Reviewed telemetry pt in NSR today, VT with ATP therapy noted  ASSESSMENT AND PLAN: 79 yo with history of CAD s/p CABG, VT s/p ICD and recent VT ablation on sotalol, ischemic cardiomyopathy, paroxysmal atrial fibrillation, and severe COPD on home oxygen presented to [facility] with respiratory failure and hypotension.  He was thought to have PNA with septic shock. He was transferred to [facility].   1. Hypotension:  Chronically his BP has been low.  SBP around 100 mm Hg is  his baseline (from prior admit). Agree with treatment of anemia and stress dose steroids.  Would avoid dopamine/ dobutamine if able given VT  2. ID: PNA.  Continue vancomycin/Zosyn.    3. CHF: Chronic systolic CHF, EF 40-98%.  Volume up.  Toprol, losartan, and spironolactone on hold.  Some volume overload, will get dose of Lasix 40 mg IV x 1 again today.  CoOx reviewed  4. CAD s/p CABG: Troponin normal here.  No chest pain.   5. Paroxysmal atrial fibrillation: He is on coumadin, INR therapeutic.  NSR currently.  Continue sotalol  6. VT: VT ablation on 11/18.  Continue sotalol.  QT ok so far on ECG.    7. Anemia Transfusion plans for today are noted  Other: Given ICD, we need to avoid central venous access if at all possible. Please DC central line as soon as able given risks for device infection.  Hillis Range MD 07/06/2013 8:27 AM

## 2013-07-06 NOTE — Progress Notes (Signed)
[facility]  Patient Status: Active (receiving services up to time of hospitalization)  [facility] is providing the following services: RN and PT  If patient discharges after hours, please call [phone].   [name] 07/06/2013, 2:23 PM

## 2013-07-06 NOTE — Progress Notes (Signed)
PULMONARY  / CRITICAL CARE MEDICINE  Name: [name] MRN: [id number] DOB: 05/27/1929    ADMISSION DATE:  07/03/2013 CONSULTATION DATE:  07/04/13  REFERRING MD :  Dr. Shirlee Latch PRIMARY SERVICE:  Cardiology  CHIEF COMPLAINT:  Bilateral airspace disease / ? PNA  BRIEF PATIENT DESCRIPTION: 79 y/o M with PMH of CAD s/p CABG, ICM, severe MR with recent admit to [facility] (d/c 11/21) for VT.  [facility] for pulmonary edema / acute resp fx.  PCCM consulted for assistance.   SIGNIFICANT EVENTS / STUDIES:  11/18 - 11/21 Admit to [facility] with recurrent VT s/p ablation 11/18  ........................................................................................................[name] 11/28 - Tx to [facility] from [facility] for acute resp fx / pulmonary edema, hypotension 12/1 - Dopamine off  LINES / TUBES: 11/30 R IJ CVL>>  CULTURES: BCx2 11/28 ([facility])>>>  ANTIBIOTICS: Zosyn 11/28>>> Vanco 11/28>>>  SUBJECTIVE: Continues to complain of difficulty to catch breath. To have blood transfusion this am per nursing reports.  VITAL SIGNS: Temp:  [97.4 F (36.3 C)-97.6 F (36.4 C)] 97.6 F (36.4 C) (12/01 0403) Pulse Rate:  [76-110] 81 (12/01 0700) BP: (88-117)/(34-64) 100/46 mmHg (12/01 0700) SpO2:  [98 %-100 %] 100 % (12/01 0700) Weight:  [147 lb 14.9 oz (67.1 kg)] 147 lb 14.9 oz (67.1 kg) (12/01 0403)  PHYSICAL EXAMINATION: General:  wdwn elderly male in NAD Neuro:  AAOx4, speech clear, MAE HEENT:  Mm pink/moist, minimal jvd Cardiovascular:  s1s2 rrr, soft murmur heard best over left sternal border Lungs:  resp's even/non-labored on [place], slight end expiratory wheeze noted Abdomen:  Round/soft, bsx4 active, nontender Musculoskeletal:  No acute deformities, 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 Orson Aloe in [place], has been on baseline steroids for years and has been weaned over past 6 months to 2.5 mg daily O2 Dependent - 2L at baseline since d/c 11/21  Plan: -intermittent CXR for trending LLL -continue brovana, pulmicort, singular -Cont empiric abx, will need full course pna -Flutter Valve -humidified oxygen at 2L (baseline)  Hypotension - Improved Concern for Adrenal Insufficiency - primary Pulm has been weaning steroids over past 6 mo CAD s/p CABG HTN ICM  AFib Valvular Heart Disease Recurrent VT s/p Ablation  Plan: -Dopamine to map goals, with VT, will need to avoid, use neo if needed further for MAp support - decrease Hydrocortisone to 25 q6 (stress dose) if remains off pressors -Rate control per Cardiology  -lactate cleared, likely related to hypotension  -continue abx as ordered -for unit prbc  Therapeutic Anticoagulation  Anemia  Plan: -continue coumadin per pharmacy  -continue Iron -to receive 1 unit PRBCs now, repeat cbc needed  ID PNA likely -had nosocomial exposure prior -narrow off zosyn, vanc, add ceftraixone -pcxr in am   Terri Piedra [facility] Physician Assistant Student 07/06/13, 8:34 AM  Today's summary:  Patient currently off dopamine.  Hypotension resolved.  Will decrease hydrocortisone to 25 q6.  Patient continues to complain of difficulty catching breath.  Patient currently on humidified O2.  Suspect that some of this shortness of breath may be due to anemia.  Pt. To receive 1 unit PRBCs.  Will continue to monitor, but sats staying at 100%.  Will cont to treat with abx as procalcitonin trending down and wbcs  trending down.  Cont to keep gentle net negative.  Will intermittently check CXRs.       Ccm time 30 min  Mcarthur Rossetti. Tyson Alias, MD, FACP Pgr: [phone] [facility]

## 2013-07-06 NOTE — Care Management Note (Addendum)
    Page 1 of 2   07/12/2013     5:57:16 PM   CARE MANAGEMENT NOTE 07/12/2013  Patient:  [name], [name]   Account Number:  [id number]  Date Initiated:  07/06/2013  Documentation initiated by:  Junius Creamer  Subjective/Objective Assessment:   ad, w sepsis     Action/Plan:   lives w wife, pcp dr Onalee Hua taper   Anticipated DC Date:     Anticipated DC Plan:  HOME W HOME HEALTH SERVICES      DC Planning Services  CM consult      [facility] Choice  Resumption Of Svcs/PTA [place] [facility]   Choice offered to / List presented to:          [facility] arranged  HH-1 RN  HH-10 DISEASE MANAGEMENT  HH-2 PT      HH agency  [facility]   Status of service:  Completed, signed off Medicare Important Message given?   (If response is "NO", the following Medicare IM given date fields will be blank) Date Medicare IM given:   Date Additional Medicare IM given:    Discharge Disposition:  HOME W HOME HEALTH SERVICES  Per UR Regulation:  Reviewed for med. necessity/level of care/duration of stay  If discussed at Long Length of Stay Meetings, dates discussed:   07/09/2013    Comments:  07/12/13 17:55 CM called [facility] to notify of discharge.  Freddy Jaksch, BSN, [place] [phone].  07/09/13 1127 Henrietta Mayo RN MSN BSN CCM [facility] liaison to relay request to [place] office for [facility] RN to ensure that pt is keeping a daily wt log and seeing PCP as arranged.  12/1 0838 debbie dowell rn,bsn will moniter for dc needs as pt progresses. pt went home last adm w home o2 and hhrn and pt w [facility]. alerted marie at [facility] of pt's adm.

## 2013-07-07 ENCOUNTER — Ambulatory Visit: Payer: Self-pay | Admitting: Cardiovascular Disease

## 2013-07-07 ENCOUNTER — Inpatient Hospital Stay (HOSPITAL_COMMUNITY): Payer: Medicare Other

## 2013-07-07 LAB — CBC
Hemoglobin: 8.5 g/dL — ABNORMAL LOW (ref 13.0–17.0)
MCH: 24.4 pg — ABNORMAL LOW (ref 26.0–34.0)
MCHC: 30.4 g/dL (ref 30.0–36.0)
MCV: 80.5 fL (ref 78.0–100.0)
Platelets: 297 10*3/uL (ref 150–400)
RBC: 3.48 MIL/uL — ABNORMAL LOW (ref 4.22–5.81)

## 2013-07-07 LAB — BASIC METABOLIC PANEL
CO2: 29 mEq/L (ref 19–32)
Calcium: 8.7 mg/dL (ref 8.4–10.5)
GFR calc Af Amer: >90 mL/min (ref >90)
Glucose, Bld: 104 mg/dL — ABNORMAL HIGH (ref 70–99)
Potassium: 3.5 mEq/L (ref 3.5–5.1)
Sodium: 140 mEq/L (ref 135–145)

## 2013-07-07 LAB — TYPE AND SCREEN
Antibody Screen: NEGATIVE
Unit division: 0

## 2013-07-07 LAB — GLUCOSE, CAPILLARY
Glucose-Capillary: 142 mg/dL — ABNORMAL HIGH (ref 70–99)
Glucose-Capillary: 96 mg/dL (ref 70–99)

## 2013-07-07 LAB — PROTIME-INR
INR: 2.38 — ABNORMAL HIGH (ref 0.00–1.49)
Prothrombin Time: 25.2 seconds — ABNORMAL HIGH (ref 11.6–15.2)

## 2013-07-07 MED ORDER — FUROSEMIDE 10 MG/ML IJ SOLN
INTRAMUSCULAR | Status: AC
  Administered 2013-07-07: 40 mg
  Filled 2013-07-07: qty 4

## 2013-07-07 MED ORDER — METHYLPREDNISOLONE SODIUM SUCC 125 MG IJ SOLR
60.0000 mg | Freq: Two times a day (BID) | INTRAMUSCULAR | Status: DC
  Administered 2013-07-07 – 2013-07-08 (×3): 60 mg via INTRAVENOUS
  Filled 2013-07-07: qty 2
  Filled 2013-07-07 (×2): qty 0.96
  Filled 2013-07-07: qty 2

## 2013-07-07 MED ORDER — FUROSEMIDE 10 MG/ML IJ SOLN
40.0000 mg | Freq: Two times a day (BID) | INTRAMUSCULAR | Status: DC
  Administered 2013-07-07 – 2013-07-09 (×5): 40 mg via INTRAVENOUS
  Filled 2013-07-07 (×6): qty 4

## 2013-07-07 NOTE — Progress Notes (Signed)
RN says cards wants CVL out RN says no need for cvl  Plan Dc cvl  Dr. Kalman Shan, M.D., [facility].C.P Pulmonary and Critical Care Medicine Staff Physician [facility] Pager: [phone], If no answer or between  15:00h - 7:00h: call [phone]  07/07/2013 7:48 PM

## 2013-07-07 NOTE — Progress Notes (Signed)
PULMONARY  / CRITICAL CARE MEDICINE  Name: [name] MRN: [id number] DOB: [birth year]    ADMISSION DATE:  07/03/2013 CONSULTATION DATE:  07/04/13  REFERRING MD :  Dr. Shirlee Latch PRIMARY SERVICE:  Cardiology  CHIEF COMPLAINT:  Bilateral airspace disease / ? PNA  BRIEF PATIENT DESCRIPTION: 79 y/o M with PMH of CAD s/p CABG, ICM, severe MR with recent admit to [facility] (d/c 11/21) for VT.  Transferred to [facility] from [facility] for pulmonary edema / acute resp fx.  PCCM consulted for assistance.   SIGNIFICANT EVENTS / STUDIES:  11/18 - 11/21 Admit to [facility] with recurrent VT s/p ablation 11/18  ........................................................................................................[name] 11/28 - Tx to [facility] from [facility] for acute resp fx / pulmonary edema, hypotension 12/1 - Dopamine off 12/2- wheezing  LINES / TUBES: 11/30 R IJ CVL>>  CULTURES: BCx2 11/28 ([place])>>>  ANTIBIOTICS: Zosyn 11/28>>>12/1 Vanco 11/28>>>12/1 Ceftriaxone 12/1>>  SUBJECTIVE: Continues to complain of difficulty to catch breath. Cards would like to have central line removed ASAP.    VITAL SIGNS: Temp:  [97.4 F (36.3 C)-97.8 F (36.6 C)] 97.6 F (36.4 C) (12/02 0800) Pulse Rate:  [79-85] 81 (12/02 0700) BP: (95-140)/(45-102) 133/78 mmHg (12/02 0700) SpO2:  [96 %-100 %] 100 % (12/02 0739) FiO2 (%):  [28 %] 28 % (12/02 0800) Weight:  [146 lb 13.2 oz (66.6 kg)] 146 lb 13.2 oz (66.6 kg) (12/02 0300)  PHYSICAL EXAMINATION: General:  wdwn elderly male in NAD Neuro:  AAOx4, speech clear, MAE HEENT:  Mm pink/moist, minimal jvd Cardiovascular:  s1s2 rrr, soft murmur heard best over left sternal border Lungs:  resp's even/non-labored on [place], end expiratory wheeze noted Abdomen:  Round/soft, bsx4 active, nontender Musculoskeletal:  No acute deformities, 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 Orson Aloe in [place], has been on baseline steroids for years and has been weaned over past 6 months to 2.5 mg daily O2 Dependent - 2L at baseline since d/c 11/21  Plan: - would repeat pcxr again in am as we attempt further diuresis - continue brovana, pulmicort, singular - Solumedrol 80 mg IM once, add maintenance with increase bronchospasm - Cont ceftriaxone - Flutter Valve - humidified oxygen at 2L (baseline) -neg balance goals, agree lasix further  Hypotension - Improved Concern for Adrenal Insufficiency - primary Pulm has been weaning steroids over past 6 mo CAD s/p CABG HTN ICM  AFib Valvular Heart Disease Recurrent VT s/p Ablation  Plan: -Dopamine off, with VT, will need to avoid, use neo if needed further for MAP support -Hydrocortisone to off -Rate control per Cardiology  - insert peripheral IVs  - D/c central line  Therapeutic Anticoagulation  Anemia  Plan: -continue coumadin per pharmacy  -continue Iron  ID PNA likely -had nosocomial exposure prior -Cont Ceftriaxone day 4/8 (of abx), will add stop date -pcxr in am   [employer]  Physician Assistant Student 07/07/13, 8:23 AM  Today's summary:  Patient now normotensive with appropriate Hgb after transfusion yesterday.  There is no real improvement in patient's breathing, and he now has an increased end expiratory wheeze.  Will give IM solumedrol today and begin oral maintenance for wheezing as patient is already on BD regiment.  Will continue ceftriaxone for the next 4 days for total course of 8 days.   Cards has asked that we get peripheral IVs so that central line can be d/c'd as they do not want to have a central line associated blood infection.    I have fully examined this patient and agree with above findings.    And edited in full  Mcarthur Rossetti. Tyson Alias, MD, FACP Pgr: [phone] [facility]

## 2013-07-07 NOTE — Progress Notes (Addendum)
Speech Language Pathology Treatment: Dysphagia  Patient Details Name: [name] MRN: [id number] DOB: 03-25-1929 Today's Date: 07/07/2013 Time: 4098-1191 SLP Time Calculation (min): 30 min  Assessment / Plan / Recommendation Clinical Impression  Pt seen to assess tolerance of po diet, introduce swallow exercises and to reinforce swallow strategies.  SLP introduced 3 exercises ([place], effortful swallow, lingual press vs Shaker exercise), providing demonstration with pt palpating pharynx to aid in demonstration.  SLP did not have pt conduct exercises due to pt shortness of breath, difficulty breathing.   Pt reports dysphagia x1 year that has limited his ability to eat due to overt coughing with intake.  Recommend to consider long term alternative means of nutrition due to pt's weight loss from dysphagia - allowing po for enjoyment.  Long term feeding tube would possibly benefit pt due to concern for dysphagia negatively impacting nutritional status.    SLP educated pt/family that long term alternative nutrition would not prevent aspiration.  SLP suspects pt has not been following compensation strategies as he was unable to verbalize them today and his spouse reports she took the precaution sign home.  Therefore only sign pt has is posted at [facility].  SLP provided written precautions for pt and requested he follow them strictly with intake to determine if decrease coughing episodes with intake.    Pt requests for his diet to be advanced given "same food" being sent for days.  MD may desire to advance diet with acceptance of increased risk, as it may help nutrition.    SLP to follow for dysphagia tx, recommend [facility] SLP.     HPI HPI: [name] is an 79 y.o. male w/ PMHx significant for CAD s/p CABG, iCM (EF 30-35% with moderate to severe MR on echo 5/14), DM2, paroxysmal afib, VT s/p ICD, s/p ablation x2 (last 06/23/13 with Dr. Johney Frame) who was recently discharged from [facility] on 11/21 who is admitted as a  transfer from [facility] for respiratory failure. 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 Pt reported that he had a swallow study performed upon completion of XRT with no aspiration at that time. He shared that his difficulty swallowing started after his previous admission (d/c 06/26/13) for cardiac procedure, and that he is coughing whenever he tries to consume textures more solid than purees.  Pt underwent BSE on 11/29 and was placed on puree/thin diet, MBS indicated due to h/o head and neck cancer tx with likely decreased sensorimotor abilities.    Today pt seen for follow up to assess tolerance of po diet and to introduce swallow exercises to mitigate dysphagia.     Pertinent Vitals Afebrile, decreased-   SLP Plan    [facility] SLP   Recommendations Diet recommendations: Dysphagia 3 (mechanical soft);Thin liquid (per pt wishes, even with increased risk) Medication Administration: Whole meds with puree (start and follow with water) Supervision: Patient able to self feed;Full supervision/cueing for compensatory strategies Compensations: Slow rate;Small sips/bites;Follow solids with liquid;Multiple dry swallows after each bite/sip Postural Changes and/or Swallow Maneuvers: Seated upright 90 degrees;Upright 30-60 min after meal              Oral Care Recommendations: Oral care BID (xerostomia) Follow up Recommendations: Home health SLP    GO     [name] Burnet, MS [facility] SLP [phone]

## 2013-07-07 NOTE — Progress Notes (Signed)
Patient ID: [name], male   DOB: 1929-07-09, 79 y.o.   MRN: [id number]   SUBJECTIVE:   Remains SOB with cough. He is having trouble eating because of frequent cough.  He has ongoing hemoptysis. Denies CP or ICD shocks.  [name] arformoterol  15 mcg Nebulization Q12H  . aspirin  81 mg Oral Daily  . budesonide  0.5 mg Nebulization BID  . cefTRIAXone (ROCEPHIN)  IV  1 g Intravenous Q24H  . digoxin  0.125 mg Oral Daily  . docusate sodium  100 mg Oral Daily  . ferrous sulfate  325 mg Oral BID WC  . hydrocortisone sodium succinate  25 mg Intravenous Q6H  . insulin aspart  0-15 Units Subcutaneous TID WC  . insulin aspart  0-5 Units Subcutaneous QHS  . loratadine  10 mg Oral Daily  . megestrol  400 mg Oral Daily  . montelukast  10 mg Oral QHS  . pantoprazole  40 mg Oral Daily  . potassium chloride  10 mEq Oral Daily  . simvastatin  40 mg Oral QHS  . sodium chloride  3 mL Intravenous Q12H  . sotalol  160 mg Oral Q12H  . warfarin  2.5 mg Oral QPC supper  . Warfarin - Physician Dosing Inpatient   Does not apply q1800     Filed Vitals:   07/07/13 0600 07/07/13 0700 07/07/13 0739 07/07/13 0800  BP: 140/61 133/78    Pulse: 80 81    Temp:    97.6 F (36.4 C)  TempSrc:    Oral  Resp:      Height:      Weight:      SpO2: 100% 100% 100%     Intake/Output Summary (Last 24 hours) at 07/07/13 0817 Last data filed at 07/07/13 0800  Gross per 24 hour  Intake  962.5 ml  Output   1950 ml  Net -987.5 ml    LABS: Basic Metabolic Panel:  Recent Labs  11/91/47 0502 07/06/13 0500 07/07/13 0430  NA 137 139 140  K 4.4 3.5 3.5  CL 102 104 102  CO2 23 27 29   GLUCOSE 130* 114* 104*  BUN 18 20 21   CREATININE 0.78 0.70 0.70  CALCIUM 9.2 8.5 8.7  MG 2.4 2.4  --    Liver Function Tests: No results found for this basename: AST, ALT, ALKPHOS, BILITOT, PROT, ALBUMIN,  in the last 72 hours No results found for this basename: LIPASE, AMYLASE,  in the last 72 hours CBC:  Recent Labs  07/06/13 1215 07/07/13 0430  WBC 18.3* 13.7*  HGB 8.8* 8.5*  HCT 29.1* 28.0*  MCV 80.6 80.5  PLT 304 297   Cardiac Enzymes:  Recent Labs  07/04/13 1334  TROPONINI <0.30    RADIOLOGY:  CXR is reviewed  PHYSICAL EXAM General: NAD, ill appearing HEENT: OP clear Neck: JVP 8-9 cm, R CVL in place Lungs:  Very poor aeration with diffuse expiratory wheezes and expiratory phase, R>L bibasilar rales are also noted CV: Nondisplaced PMI.  RRR.  No peripheral edema.   Abdomen: Soft, nontender, no hepatosplenomegaly, no distention.  Neurologic: Alert and oriented x 3.  Psych: Normal affect. Extremities: No clubbing or cyanosis.   TELEMETRY: Reviewed telemetry pt in NSR today, nonsustained VT observed  ASSESSMENT AND PLAN: 79 yo with history of CAD s/p CABG, VT s/p ICD and recent VT ablation on sotalol, ischemic cardiomyopathy, paroxysmal atrial fibrillation, and severe COPD on home oxygen presented to [facility] with respiratory failure and hypotension.  He was thought to have PNA with septic shock. He was transferred to [facility]   1. Hypotension:  Chronically his BP has been low.  SBP around 100 mm Hg is his baseline (from prior admit).  This had improved with PRBCs.  2. ID: PNA.  Continue vancomycin/Zosyn.    3. CHF: Chronic systolic CHF, EF 16-10%.  Volume up.  Toprol, losartan, and spironolactone on hold. Remains volume overloaded.  Will continue with diuresis.  4. CAD s/p CABG: stable, no changes  5. Paroxysmal atrial fibrillation: He is on coumadin, INR therapeutic.  NSR currently.  Continue sotalol  6. VT: VT ablation on 11/18.  Continue sotalol.  QT ok so far on ECG.    7. Anemia Stable post transfusion  Other: Given ICD, we need to avoid central venous access if at all possible. Please DC central line as soon as able given risks for device infection.  Hillis Range MD 07/07/2013 8:17 AM

## 2013-07-08 ENCOUNTER — Inpatient Hospital Stay (HOSPITAL_COMMUNITY): Payer: Medicare Other

## 2013-07-08 LAB — CBC
HCT: 30 % — ABNORMAL LOW (ref 39.0–52.0)
Hemoglobin: 9.3 g/dL — ABNORMAL LOW (ref 13.0–17.0)
MCV: 79.6 fL (ref 78.0–100.0)
RBC: 3.77 MIL/uL — ABNORMAL LOW (ref 4.22–5.81)
WBC: 7 10*3/uL (ref 4.0–10.5)

## 2013-07-08 LAB — BASIC METABOLIC PANEL
CO2: 29 mEq/L (ref 19–32)
Chloride: 100 mEq/L (ref 96–112)
Creatinine, Ser: 0.71 mg/dL (ref 0.50–1.35)
Potassium: 3.6 mEq/L (ref 3.5–5.1)
Sodium: 140 mEq/L (ref 135–145)

## 2013-07-08 LAB — PROTIME-INR
INR: 2.42 — ABNORMAL HIGH (ref 0.00–1.49)
Prothrombin Time: 25.5 seconds — ABNORMAL HIGH (ref 11.6–15.2)

## 2013-07-08 LAB — GLUCOSE, CAPILLARY
Glucose-Capillary: 146 mg/dL — ABNORMAL HIGH (ref 70–99)
Glucose-Capillary: 149 mg/dL — ABNORMAL HIGH (ref 70–99)

## 2013-07-08 MED ORDER — METHYLPREDNISOLONE SODIUM SUCC 40 MG IJ SOLR
40.0000 mg | Freq: Two times a day (BID) | INTRAMUSCULAR | Status: AC
Start: 2013-07-08 — End: 2013-07-10
  Administered 2013-07-08 – 2013-07-10 (×4): 40 mg via INTRAVENOUS
  Filled 2013-07-08 (×4): qty 1

## 2013-07-08 NOTE — Progress Notes (Signed)
07/08/2013 5:03 PM Pt has had several runs of VTach  8-10 beat runs. Asymptomatic. Reported to Dr. Ladona Ridgel.Excell Seltzer, Linnell Fulling

## 2013-07-08 NOTE — Progress Notes (Signed)
PULMONARY  / CRITICAL CARE MEDICINE  Name: MARKIAN GLOCKNER MRN: [id number] DOB: [birth year]    ADMISSION DATE:  07/03/2013 CONSULTATION DATE:  07/04/13  REFERRING MD :  Dr. Shirlee Latch PRIMARY SERVICE:  Cardiology  CHIEF COMPLAINT:  Bilateral airspace disease / ? PNA  BRIEF PATIENT DESCRIPTION: 79 y/o M with PMH of CAD s/p CABG, ICM, severe MR with recent admit to [facility] (d/c 11/21) for VT.  Transferred to [facility] from [facility] for pulmonary edema / acute resp fx.  PCCM consulted for assistance.   SIGNIFICANT EVENTS / STUDIES:  11/18 - 11/21 Admit to [facility] with recurrent VT s/p ablation 11/18  ........................................................................................................[name] 11/28 - Tx to [facility] from [facility] for acute resp fx / pulmonary edema, hypotension 12/1 - Dopamine off 12/2- wheezing  LINES / TUBES: 11/30 R IJ CVL>>  CULTURES: BCx2 11/28 ([place])>>>  ANTIBIOTICS: Zosyn 11/28>>>12/1 Vanco 11/28>>>12/1 Ceftriaxone 12/1>>  SUBJECTIVE:  Breathing feels much better today.  Feels like he is choking on his food after a couple of bites and then has to cough it up and spit it out.  VITAL SIGNS: Temp:  [96.6 F (35.9 C)-98 F (36.7 C)] 98 F (36.7 C) (12/03 0800) Pulse Rate:  [74-91] 89 (12/03 0800) Resp:  [20] 20 (12/03 0800) BP: (80-146)/(41-82) 138/71 mmHg (12/03 0800) SpO2:  [97 %-100 %] 100 % (12/03 0800) Weight:  [144 lb 13.5 oz (65.7 kg)] 144 lb 13.5 oz (65.7 kg) (12/03 0434)  PHYSICAL EXAMINATION: General:  wdwn elderly male in NAD Neuro:  AAOx4, speech clear, MAE HEENT:  Mm pink/moist, minimal jvd Cardiovascular:  s1s2 rrr, soft murmur heard best over left sternal border Lungs:  resp's even/non-labored on , CTA Abdomen:  Round/soft, bsx4 active, nontender Musculoskeletal:  No acute deformities, 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 Orson Aloe in [place], has been on baseline steroids for years and has been weaned over past 6 months to 2.5 mg daily O2 Dependent - 2L at baseline since d/c 11/21  Plan: - would continue gentle diuresis with net negative balance goal. - morning pcxr   - continue brovana, pulmicort, singular - Solumedrol 40 mg x3 days IV then to pred taper - Cont ceftriaxone - Flutter Valve - PT/OT - humidified oxygen at 2L (baseline) -repeat swallow eval -improved with neg 1.3 liters, continue lasix  Hypotension - Improved Concern for Adrenal Insufficiency - primary Pulm has been weaning steroids over past 6 mo CAD s/p CABG HTN ICM  AFib Valvular Heart Disease Recurrent VT s/p Ablation  Plan: -Hydrocortisone to off while on solumedral -Rate control per Cardiology   Therapeutic Anticoagulation  Anemia  Plan: -continue coumadin per pharmacy  -continue Iron  ID PNA likely -had nosocomial exposure prior -Cont Ceftriaxone day 5/8 (of abx), will add stop date  [employer] Physician Assistant Student 07/08/13, 8:17 AM  Today's summary:  Breathing  much improved, will complete 3 day course of steroids.  Will complete full course of abx.  Recommend continuing diuresis with negative balance goal as CXR is improved.  Will repeat swallow eval today as patient feels like he is choking on diet.  Have also added PT/OT to get him up and moving.  Pt is stable and much improved.  Can sign off of patient to the care of cardiology.  Will be available for further consult if needed.      Mcarthur Rossetti.  Tyson Alias, MD, FACP Pgr: [phone] [facility]

## 2013-07-08 NOTE — Progress Notes (Signed)
Chart review complete.  Patient is not eligible for [facility] Care Management services because his/her PCP is not a [facility] primary care provider or is not [facility] affiliated.  For any additional questions or new referrals please contact Tim Henderson BSN RN [facility] Liaison at [phone] °

## 2013-07-08 NOTE — Progress Notes (Signed)
Patient ID: [name], male   DOB: Nov 28, 1928, 79 y.o.   MRN: [id number]   SUBJECTIVE:   Patient feels that shortness of breath is improved.  No chest pain or other complaints this morning.  Speech therapy saw patient yesterday with recommendations for preventing aspiration.   CURRENT MEDICATIONS: . arformoterol  15 mcg Nebulization Q12H  . aspirin  81 mg Oral Daily  . budesonide  0.5 mg Nebulization BID  . cefTRIAXone (ROCEPHIN)  IV  1 g Intravenous Q24H  . digoxin  0.125 mg Oral Daily  . docusate sodium  100 mg Oral Daily  . ferrous sulfate  325 mg Oral BID WC  . furosemide  40 mg Intravenous BID  . insulin aspart  0-15 Units Subcutaneous TID WC  . insulin aspart  0-5 Units Subcutaneous QHS  . loratadine  10 mg Oral Daily  . megestrol  400 mg Oral Daily  . methylPREDNISolone (SOLU-MEDROL) injection  60 mg Intravenous Q12H  . montelukast  10 mg Oral QHS  . pantoprazole  40 mg Oral Daily  . potassium chloride  10 mEq Oral Daily  . simvastatin  40 mg Oral QHS  . sodium chloride  3 mL Intravenous Q12H  . sotalol  160 mg Oral Q12H  . warfarin  2.5 mg Oral QPC supper  . Warfarin - Physician Dosing Inpatient   Does not apply q1800     Filed Vitals:   07/08/13 0000 07/08/13 0400 07/08/13 0434 07/08/13 0600  BP: 107/43 112/61  126/55  Pulse: 74 74  74  Temp:  96.9 F (36.1 C)    TempSrc:  Oral    Resp:      Height:      Weight:   144 lb 13.5 oz (65.7 kg)   SpO2: 100% 100%  100%    Intake/Output Summary (Last 24 hours) at 07/08/13 0740 Last data filed at 07/08/13 0400  Gross per 24 hour  Intake    650 ml  Output   2027 ml  Net  -1377 ml    LABS: Basic Metabolic Panel:  Recent Labs  40/98/11 0500 07/07/13 0430 07/08/13 0425  NA 139 140 140  K 3.5 3.5 3.6  CL 104 102 100  CO2 27 29 29   GLUCOSE 114* 104* 138*  BUN 20 21 24*  CREATININE 0.70 0.70 0.71  CALCIUM 8.5 8.7 8.7  MG 2.4  --   --    CBC:  Recent Labs  07/07/13 0430 07/08/13 0425  WBC 13.7*  7.0  HGB 8.5* 9.3*  HCT 28.0* 30.0*  MCV 80.5 79.6  PLT 297 313    PHYSICAL EXAM General: NAD, ill appearing HEENT: OP clear Neck: JVP 8-9 cm, R CVL in place Lungs: diffuse expiratory wheezes and expiratory phase, R>L bibasilar rales are also noted CV: Nondisplaced PMI.  IRRR.  No peripheral edema.   Abdomen: Soft, nontender, no hepatosplenomegaly, no distention.  Neurologic: Alert and oriented x 3.  Psych: Normal affect. Extremities: No clubbing or cyanosis.   TELEMETRY: Reviewed telemetry -  nonsustained VT observed, intermittent atrial fibrillation with NSR  ASSESSMENT AND PLAN: 79 yo with history of CAD s/p CABG, VT s/p ICD and recent VT ablation on sotalol, ischemic cardiomyopathy, paroxysmal atrial fibrillation, and severe COPD on home oxygen presented to [facility] with respiratory failure and hypotension.  He was thought to have PNA with septic shock. He was transferred to [facility].   1. Hypotension:  Chronically his BP has been low.  SBP around  100 mm Hg is his baseline (from prior admit).  This had improved with PRBCs.  2. ID: PNA.  Continue vancomycin/Zosyn.    3. CHF: Chronic systolic CHF, EF 16-10%.  Volume up.  Toprol, losartan, and spironolactone on hold. Remains volume overloaded.  Will continue with diuresis.  4. CAD s/p CABG: stable, no changes  5. Paroxysmal atrial fibrillation: He is on coumadin, INR therapeutic.  NSR currently.  Continue sotalol  6. VT: VT ablation on 11/18.  Continue sotalol.  QT ok so far on ECG.    7. Anemia Stable post transfusion Rec: overall the patient is improved. He will need advancement of his activity today. Gentle diuresis as blood pressure allows. Follow h/h.  Leonia Reeves.D.

## 2013-07-09 ENCOUNTER — Encounter: Payer: Self-pay | Admitting: Internal Medicine

## 2013-07-09 ENCOUNTER — Inpatient Hospital Stay (HOSPITAL_COMMUNITY): Payer: Medicare Other

## 2013-07-09 LAB — GLUCOSE, CAPILLARY
Glucose-Capillary: 104 mg/dL — ABNORMAL HIGH (ref 70–99)
Glucose-Capillary: 143 mg/dL — ABNORMAL HIGH (ref 70–99)
Glucose-Capillary: 146 mg/dL — ABNORMAL HIGH (ref 70–99)
Glucose-Capillary: 137 mg/dL — ABNORMAL HIGH (ref 70–99)

## 2013-07-09 LAB — BASIC METABOLIC PANEL
CO2: 29 mEq/L (ref 19–32)
Calcium: 9 mg/dL (ref 8.4–10.5)
GFR calc Af Amer: >90 mL/min (ref >90)
Glucose, Bld: 120 mg/dL — ABNORMAL HIGH (ref 70–99)
Potassium: 3.9 mEq/L (ref 3.5–5.1)
Sodium: 136 mEq/L (ref 135–145)

## 2013-07-09 LAB — CBC
Hemoglobin: 9.4 g/dL — ABNORMAL LOW (ref 13.0–17.0)
MCH: 24 pg — ABNORMAL LOW (ref 26.0–34.0)
MCV: 80.6 fL (ref 78.0–100.0)
Platelets: 340 10*3/uL (ref 150–400)
RBC: 3.92 MIL/uL — ABNORMAL LOW (ref 4.22–5.81)
WBC: 11.2 10*3/uL — ABNORMAL HIGH (ref 4.0–10.5)

## 2013-07-09 LAB — PROTIME-INR: INR: 2 — ABNORMAL HIGH (ref 0.00–1.49)

## 2013-07-09 MED ORDER — ALBUTEROL SULFATE (5 MG/ML) 0.5% IN NEBU
2.5000 mg | INHALATION_SOLUTION | RESPIRATORY_TRACT | Status: DC | PRN
  Administered 2013-07-09: 2.5 mg via RESPIRATORY_TRACT

## 2013-07-09 MED ORDER — FUROSEMIDE 10 MG/ML IJ SOLN
40.0000 mg | Freq: Every day | INTRAMUSCULAR | Status: DC
  Administered 2013-07-10: 40 mg via INTRAVENOUS
  Filled 2013-07-09: qty 4

## 2013-07-09 NOTE — Evaluation (Signed)
Physical Therapy Evaluation Patient Details Name: [name] MRN: [id number] DOB: 16-Sep-1928 Today's Date: 07/09/2013 Time: 4098-1191 PT Time Calculation (min): 33 min  PT Assessment / Plan / Recommendation History of Present Illness  Discharged after ablation 11/21 and returned 11/28 due to respiratory failure; +sepsis due to pna  Clinical Impression  Pt admitted with sepsis. Pt with recent hospitalization due to vtach requiring ablation. Pt currently with functional limitations due to the deficits listed below (see PT Problem List).  Pt will benefit from skilled PT to increase their independence and safety with mobility to allow discharge to the venue listed below.       PT Assessment  Patient needs continued PT services    Follow Up Recommendations  Home health PT;Supervision for mobility/OOB    Does the patient have the potential to tolerate intense rehabilitation      Barriers to Discharge        Equipment Recommendations  None recommended by PT    Recommendations for Other Services     Frequency Min 3X/week    Precautions / Restrictions Precautions Precautions: Fall   Pertinent Vitals/Pain HR 78-90 SaO2 94-100% on 2 L O2 Denied pain      Mobility  Bed Mobility Bed Mobility: Supine to Sit;Sitting - Scoot to Edge of Bed Supine to Sit: 4: Min guard;HOB elevated Sitting - Scoot to [place] of Bed: 5: Supervision Details for Bed Mobility Assistance: incr time and effort; supervision for safety Transfers Transfers: Sit to Stand;Stand to Sit Sit to Stand: 5: Supervision;With upper extremity assist Stand to Sit: 5: Supervision;With upper extremity assist Details for Transfer Assistance: x2; no cues for safe use of RW Ambulation/Gait Ambulation/Gait Assistance: 4: Min guard Ambulation Distance (Feet): 150 Feet Assistive device: Rolling walker Ambulation/Gait Assistance Details: pt pushing RW too far ahead, however once cued, did well keeping it closer to him; vc  for safety with turning as pt attempted to complete an 180* turn with RW lifted off floorand feet planted/pivoted until they were crossing Gait Pattern: Step-through pattern;Decreased stride length;Trunk flexed Gait velocity: decreased    Exercises Other Exercises Other Exercises: Educated on bed/chair level exercises he can perform throughout the day and handout provided. Reviewed ankle pumps, quad sets, bridging, heelslides.   PT Diagnosis: Generalized weakness  PT Problem List: Decreased strength;Decreased activity tolerance;Decreased balance;Decreased mobility;Decreased knowledge of use of DME;Decreased safety awareness PT Treatment Interventions: DME instruction;Gait training;Functional mobility training;Therapeutic activities;Therapeutic exercise;Balance training;Patient/family education     PT Goals(Current goals can be found in the care plan section) Acute Rehab PT Goals Patient Stated Goal: to go  home PT Goal Formulation: With patient Time For Goal Achievement: 07/16/13 Potential to Achieve Goals: Good  Visit Information  Last PT Received On: 07/09/13 Assistance Needed: +1 History of Present Illness: Discharged after ablation 11/21 and returned 11/28 due to respiratory failure; +sepsis due to pna       Prior Functioning  Home Living Family/patient expects to be discharged to:: Private residence Living Arrangements: Spouse/significant other;Children Available Help at Discharge: Family;Available 24 hours/day Type of Home: House Home Access: Stairs to enter [employer] of Steps: 1 Home Layout: One level Home Equipment: Walker - 4 wheels;Cane - single point;Bedside commode;Walker - 2 wheels;Shower seat;Other (comment) (home oxygen) Additional Comments: got rollator on recent d/c home Prior Function Level of Independence: Independent with assistive device(s) Gait / Transfers Assistance Needed: Rollator at all times (although admits it is difficult to use inside  his "small house") Communication Communication: No difficulties  Cognition  Cognition Arousal/Alertness: Awake/alert Behavior During Therapy: WFL for tasks assessed/performed Overall Cognitive Status: Within Functional Limits for tasks assessed    Extremity/Trunk Assessment Upper Extremity Assessment Upper Extremity Assessment: Generalized weakness Lower Extremity Assessment Lower Extremity Assessment: Generalized weakness Cervical / Trunk Assessment Cervical / Trunk Assessment: Kyphotic   Balance Balance Balance Assessed: Yes Static Sitting Balance Static Sitting - Balance Support: No upper extremity supported;Feet supported Static Sitting - Level of Assistance: 7: Independent Static Standing Balance Static Standing - Balance Support: No upper extremity supported Static Standing - Level of Assistance: 7: Independent Static Standing - Comment/# of Minutes: feet shoulder width [place] - Eyes Opened: 30 Dynamic Standing Balance Dynamic Standing - Balance Support: No upper extremity supported Dynamic Standing - Level of Assistance: 4: Min assist Dynamic Standing - Balance Activities: Reaching for objects  End of Session PT - End of Session Equipment Utilized During Treatment: Gait belt;Oxygen Activity Tolerance: Patient tolerated treatment well Patient left: in chair;with call bell/phone within reach;with family/visitor present Nurse Communication: Mobility status  GP     [name] 07/09/2013, 4:25 PM Pager [phone]

## 2013-07-09 NOTE — Progress Notes (Signed)
Patient ID: [name], male   DOB: 12-09-1928, 79 y.o.   MRN: [id number] Subjective:  Nice diuresis but still some dyspnea with exertion. Still weak.  Objective:  Vital Signs in the last 24 hours: Temp:  [97.5 F (36.4 C)-98 F (36.7 C)] 98 F (36.7 C) (12/04 1157) Pulse Rate:  [73-88] 78 (12/04 1157) Resp:  [18] 18 (12/04 1157) BP: (93-145)/(39-76) 97/39 mmHg (12/04 1200) SpO2:  [95 %-100 %] 98 % (12/04 1200) Weight:  [143 lb 15.4 oz (65.3 kg)] 143 lb 15.4 oz (65.3 kg) (12/04 0500)  Intake/Output from previous day: 12/03 0701 - 12/04 0700 In: 1010 [P.O.:960; IV Piggyback:50] Out: 3800 [Urine:3800] Intake/Output from this shift: Total I/O In: 410 [P.O.:360; IV Piggyback:50] Out: 1700 [Urine:1700]  Physical Exam: Chronically ill appearing NAD HEENT: Unremarkable Neck:  7cm JVD, no thyromegally Back:  No CVA tenderness Lungs:  Clear with basilar rales HEART:  Regular rate rhythm, no murmurs, no rubs, no clicks Abd:  soft, positive bowel sounds, no organomegally, no rebound, no guarding Ext:  2 plus pulses, no edema, no cyanosis, no clubbing Skin:  No rashes no nodules Neuro:  CN II through XII intact, motor grossly intact  Lab Results:  Recent Labs  07/08/13 0425 07/09/13 0417  WBC 7.0 11.2*  HGB 9.3* 9.4*  PLT 313 340    Recent Labs  07/08/13 0425 07/09/13 0417  NA 140 136  K 3.6 3.9  CL 100 95*  CO2 29 29  GLUCOSE 138* 120*  BUN 24* 25*  CREATININE 0.71 0.76   No results found for this basename: TROPONINI, CK, MB,  in the last 72 hours Hepatic Function Panel No results found for this basename: PROT, ALBUMIN, AST, ALT, ALKPHOS, BILITOT, BILIDIR, IBILI,  in the last 72 hours No results found for this basename: CHOL,  in the last 72 hours No results found for this basename: PROTIME,  in the last 72 hours  Imaging: Dg Chest Port 1 View  07/09/2013   CLINICAL DATA:  Pulmonary edema  EXAM: PORTABLE CHEST - 1 VIEW  COMPARISON:  Yesterday  FINDINGS:  Stable mild basilar atelectasis. Stable right peritracheal soft tissue mass related to the thyroid gland. Normal heart size. Postoperative changes. Pulmonary vascularity within normal limits. No pneumothorax. Tiny pleural effusions are suspected.  IMPRESSION: Stable mild bibasilar atelectasis.   Electronically Signed   By: Maryclare Bean M.D.   On: 07/09/2013 07:55   Dg Chest Port 1 View  07/08/2013   CLINICAL DATA:  Chest pain and short of breath  EXAM: PORTABLE CHEST - 1 VIEW  COMPARISON:  07/07/2013  FINDINGS: Improving bilateral airspace disease suggestive of pulmonary edema. Small bilateral pleural effusion.  COPD with hyperinflation and fibrosis.  Prior CABG. AICD unchanged in position. Right jugular catheter tip removed. No pneumothorax.  IMPRESSION: Improving bilateral airspace disease consistent with improving pulmonary edema.   Electronically Signed   By: Marlan Palau M.D.   On: 07/08/2013 07:17    Cardiac Studies: Tele - nsr with atrial tachycardia Assessment/Plan:  1. Acute on chronic systolic heart failure 2. Pneumonia on anti-biotics 3. H/o VT s/p ablation and on Sotalol, still with NSVT but stable 4. Probable atrial tachycardia Rec: will reduce dose of lasix. Will continue sotalol. Increase activity/await PT.  LOS: 6 days    Gregg Taylor,M.D. 07/09/2013, 1:13 PM

## 2013-07-09 NOTE — Progress Notes (Cosign Needed)
Speech Language Pathology Treatment: Dysphagia  Patient Details Name: [name] MRN: [id number] DOB: 07-21-1929 Today's Date: 07/09/2013 Time: 4098-1191 SLP Time Calculation (min): 41 min  Assessment / Plan / Recommendation Clinical Impression  Pt. Not eating pureed diet, and wants to advance to solids.  Diet advanced by RN and PA and RN reports pt. Did well with breakfast.  Reviewed MBS results, which revealed pt. Has significant laryngeal residue after the swallow with solid foods, which he is unaware of, due to reduced sensation following radiation treatment.  SLP educated pt. And wife to potential risks associated with aspiration of solid foods, including pneumonia and asphyxiation with possible death.  Pt. States that he agrees to accept these risks and eat solid foods.  MD may want to readdress code status. Will advance diet with strict precautions (which were reviewed with pt., wife, and RN) and continue ST for assessment of diet tolerance and cont. Education.   HPI HPI: [name] is an 79 y.o. male w/ PMHx significant for CAD s/p CABG, iCM (EF 30-35% with moderate to severe MR on echo 5/14), DM2, paroxysmal afib, VT s/p ICD, s/p ablation x2 (last 06/23/13 with Dr. Johney Frame) who was recently discharged from [facility] on 11/21 who is admitted as a transfer from [facility] for respiratory failure. 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 Pt reported that he had a swallow study performed upon completion of XRT with no aspiration at that time. He shared that his difficulty swallowing started after his previous admission (d/c 06/26/13) for cardiac procedure, and that he is coughing whenever he tries to consume textures more solid than purees.  Pt underwent BSE on 11/29 and was placed on puree/thin diet, MBS indicated due to h/o head and neck cancer tx with likely decreased sensorimotor abilities.     Today pt seen for follow up to assess tolerance of po diet and to introduce swallow exercises to mitigate dysphagia.     Pertinent Vitals Afebrilbe, crackles in left base per RN  SLP Plan  Continue with current plan of care    Recommendations Diet recommendations: Dysphagia 3 (mechanical soft);Thin liquid Liquids provided via: Straw;Cup Medication Administration: Whole meds with puree Supervision: Patient able to self feed;Full supervision/cueing for compensatory strategies Compensations: Slow rate;Small sips/bites;Multiple dry swallows after each bite/sip;Follow solids with liquid Postural Changes and/or Swallow Maneuvers: Seated upright 90 degrees;Upright 30-60 min after meal              Oral Care Recommendations: Oral care BID Follow up Recommendations: Home health SLP Plan: Continue with current plan of care    GO     [name] T 07/09/2013, 11:56 AM

## 2013-07-10 ENCOUNTER — Encounter (HOSPITAL_COMMUNITY): Payer: Self-pay | Admitting: *Deleted

## 2013-07-10 LAB — PROTIME-INR: Prothrombin Time: 23 seconds — ABNORMAL HIGH (ref 11.6–15.2)

## 2013-07-10 LAB — BASIC METABOLIC PANEL
CO2: 31 mEq/L (ref 19–32)
Calcium: 9.1 mg/dL (ref 8.4–10.5)
Chloride: 95 mEq/L — ABNORMAL LOW (ref 96–112)
Creatinine, Ser: 0.78 mg/dL (ref 0.50–1.35)
GFR calc non Af Amer: 82 mL/min — ABNORMAL LOW (ref >90)
Glucose, Bld: 166 mg/dL — ABNORMAL HIGH (ref 70–99)

## 2013-07-10 LAB — GLUCOSE, CAPILLARY
Glucose-Capillary: 116 mg/dL — ABNORMAL HIGH (ref 70–99)
Glucose-Capillary: 128 mg/dL — ABNORMAL HIGH (ref 70–99)
Glucose-Capillary: 154 mg/dL — ABNORMAL HIGH (ref 70–99)

## 2013-07-10 MED ORDER — FUROSEMIDE 40 MG PO TABS
40.0000 mg | ORAL_TABLET | Freq: Every day | ORAL | Status: DC
  Filled 2013-07-10: qty 1

## 2013-07-10 MED ORDER — LOSARTAN POTASSIUM 25 MG PO TABS
25.0000 mg | ORAL_TABLET | Freq: Every day | ORAL | Status: DC
  Administered 2013-07-10 – 2013-07-12 (×3): 25 mg via ORAL
  Filled 2013-07-10 (×3): qty 1

## 2013-07-10 MED ORDER — CARVEDILOL 6.25 MG PO TABS
6.2500 mg | ORAL_TABLET | Freq: Two times a day (BID) | ORAL | Status: DC
  Administered 2013-07-10 – 2013-07-11 (×3): 6.25 mg via ORAL
  Filled 2013-07-10 (×5): qty 1

## 2013-07-10 NOTE — Progress Notes (Addendum)
Patient arrived on unit from 2H, states no SOB or pain at this time.  Patient is A&Ox4, resting comfortably in bed, wife at bedside.  Phone and call bell within reach.  Will continue to monitor.

## 2013-07-10 NOTE — Progress Notes (Signed)
Patient ID: [name], male   DOB: 04/07/1929, 79 y.o.   MRN: [id number]   SUBJECTIVE:   Patient feels that shortness of breath is significantly improved.  No chest pain or other complaints this morning.  PT saw patient yesterday with recommended HHPT.    CURRENT MEDICATIONS: . arformoterol  15 mcg Nebulization Q12H  . aspirin  81 mg Oral Daily  . budesonide  0.5 mg Nebulization BID  . cefTRIAXone (ROCEPHIN)  IV  1 g Intravenous Q24H  . digoxin  0.125 mg Oral Daily  . docusate sodium  100 mg Oral Daily  . ferrous sulfate  325 mg Oral BID WC  . furosemide  40 mg Intravenous Daily  . insulin aspart  0-15 Units Subcutaneous TID WC  . insulin aspart  0-5 Units Subcutaneous QHS  . loratadine  10 mg Oral Daily  . megestrol  400 mg Oral Daily  . methylPREDNISolone (SOLU-MEDROL) injection  40 mg Intravenous Q12H  . montelukast  10 mg Oral QHS  . pantoprazole  40 mg Oral Daily  . potassium chloride  10 mEq Oral Daily  . simvastatin  40 mg Oral QHS  . sodium chloride  3 mL Intravenous Q12H  . sotalol  160 mg Oral Q12H  . warfarin  2.5 mg Oral QPC supper  . Warfarin - Physician Dosing Inpatient   Does not apply q1800     Filed Vitals:   07/10/13 0200 07/10/13 0400 07/10/13 0500 07/10/13 0600  BP: 132/49 108/44  123/55  Pulse: 84 84  83  Temp:  97.6 F (36.4 C)    TempSrc:  Oral    Resp:      Height:      Weight:   141 lb 5 oz (64.1 kg)   SpO2: 100% 96%  100%    Intake/Output Summary (Last 24 hours) at 07/10/13 0838 Last data filed at 07/09/13 2300  Gross per 24 hour  Intake    770 ml  Output   2800 ml  Net  -2030 ml    LABS: Basic Metabolic Panel:  Recent Labs  40/98/11 0425 07/09/13 0417  NA 140 136  K 3.6 3.9  CL 100 95*  CO2 29 29  GLUCOSE 138* 120*  BUN 24* 25*  CREATININE 0.71 0.76  CALCIUM 8.7 9.0   CBC:  Recent Labs  07/08/13 0425 07/09/13 0417  WBC 7.0 11.2*  HGB 9.3* 9.4*  HCT 30.0* 31.6*  MCV 79.6 80.6  PLT 313 340    PHYSICAL  EXAM General: NAD, less ill and pleasant appearing HEENT: OP clear Neck: JVP 8-9 cm, R CVL in place Lungs:  Clear today! CV: Nondisplaced PMI.  RRR.  No peripheral edema.   Abdomen: Soft, nontender, no hepatosplenomegaly, no distention.  Neurologic: Alert and oriented x 3.  Psych: Normal affect. Extremities: No clubbing or cyanosis.   TELEMETRY: Reviewed telemetry -  intermittent atrial fibrillation with NSR  ASSESSMENT AND PLAN: 79 yo with history of CAD s/p CABG, VT s/p ICD and recent VT ablation on sotalol, ischemic cardiomyopathy, paroxysmal atrial fibrillation, and severe COPD on home oxygen presented to [facility] with respiratory failure and hypotension.  He was thought to have PNA with septic shock. He was transferred to [facility].   1. Hypotension:  Resolved with treatment of adrenal insufficiency and anemia.  He may require treatment for adrenal insufficiency long term.  I will ask [facility] to assist with transitioning to oral steroids.  2. ID: PNA.  On antibiotics Will  ask [facility] to decide oral antibiotics or not  3. CHF: Chronic systolic CHF, EF 16-10%.  Volume up.  Will add coreg and restart losartan.  Titrate these as above.  Could restart spironolactone if BP allows after other medicine is titrated.  Volume is much improved.  Will switch to PO lasix for diuresis.  4. CAD s/p CABG: stable, no changes  5. Paroxysmal atrial fibrillation: He is on coumadin, INR therapeutic.  NSR currently.  Continue sotalol  6. VT: VT ablation on 11/18.  Continue sotalol.  Add coreg  7. Anemia Clinically much improved with prbcs Needs outpatient workup arranged  Will transfer to telemetry Will ask [facility] to assist with management/ home recommendations for AI, PNA, aspiration, and COPD. Will need transition of care visit in our office next week I suspect he could probably discharge tomorrow. Will need HHPT at home Follow-up with me in 4 weeks.

## 2013-07-10 NOTE — Progress Notes (Signed)
Speech Language Pathology Treatment: Dysphagia  Patient Details Name: [name] MRN: [id number] DOB: 1928-08-12 Today's Date: 07/10/2013 Time: 1210-1220 SLP Time Calculation (min): 10 min  Assessment / Plan / Recommendation Clinical Impression  Pt reports improved symptoms today, feeling better overall and asserts swallowing has improved.  Pt with acute on chronic dysphagia which likely fluctuates and is dependent upon overall medical condition.  Consumed solids, liquids from straw with cues (mod I) to follow precautions as written and minimize choking/aspiration risk.    Wife present, in agreement.  Rec continuing current diet with precautions.    HPI HPI: [name] is an 79 y.o. male w/ PMHx significant for CAD s/p CABG, iCM (EF 30-35% with moderate to severe MR on echo 5/14), DM2, paroxysmal afib, VT s/p ICD, s/p ablation x2 (last 06/23/13 with Dr. Johney Frame) who was recently discharged from [facility] on 11/21 who is admitted as a transfer from [facility] for respiratory failure. 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 Pt reported that he had a swallow study performed upon completion of XRT with no aspiration at that time. He shared that his difficulty swallowing started after his previous admission (d/c 06/26/13) for cardiac procedure, and that he is coughing whenever he tries to consume textures more solid than purees.  Pt underwent BSE on 11/29 and was placed on puree/thin diet, MBS indicated due to h/o head and neck cancer tx with likely decreased sensorimotor abilities.    Today pt seen for follow up to assess tolerance of po diet and to introduce swallow exercises to mitigate dysphagia.        SLP Plan  Continue with current plan of care : f/u x1 then d/c   Recommendations Diet recommendations: Dysphagia 3 (mechanical soft);Thin liquid Liquids provided via: Straw;Cup Medication  Administration: Whole meds with puree Supervision: Patient able to self feed;Full supervision/cueing for compensatory strategies Compensations: Slow rate;Small sips/bites;Multiple dry swallows after each bite/sip;Follow solids with liquid Postural Changes and/or Swallow Maneuvers: Seated upright 90 degrees;Upright 30-60 min after meal              Oral Care Recommendations: Oral care BID Plan: Continue with current plan of care    [name] L. [name], [place] CCC/SLP Pager [phone]      [name] 07/10/2013, 12:56 PM

## 2013-07-10 NOTE — Progress Notes (Signed)
Patient transferred to room 4E04 via wheelchair with meds, chart, and belongings. Wife accompanied transfer. Report given to receiving nurse Irving Burton, RN.   Dawson Bills, RN

## 2013-07-11 ENCOUNTER — Encounter (HOSPITAL_COMMUNITY): Payer: Self-pay | Admitting: Internal Medicine

## 2013-07-11 LAB — BASIC METABOLIC PANEL
BUN: 26 mg/dL — ABNORMAL HIGH (ref 6–23)
CO2: 32 mEq/L (ref 19–32)
Chloride: 97 mEq/L (ref 96–112)
GFR calc Af Amer: >90 mL/min (ref >90)
GFR calc non Af Amer: 81 mL/min — ABNORMAL LOW (ref >90)
Glucose, Bld: 112 mg/dL — ABNORMAL HIGH (ref 70–99)
Potassium: 4.2 mEq/L (ref 3.5–5.1)
Sodium: 136 mEq/L (ref 135–145)

## 2013-07-11 LAB — GLUCOSE, CAPILLARY: Glucose-Capillary: 103 mg/dL — ABNORMAL HIGH (ref 70–99)

## 2013-07-11 LAB — PROTIME-INR: Prothrombin Time: 22.4 seconds — ABNORMAL HIGH (ref 11.6–15.2)

## 2013-07-11 MED ORDER — PREDNISONE (PAK) 10 MG PO TABS: 10.0000 mg | ORAL_TABLET | Freq: Four times a day (QID) | ORAL | Status: DC

## 2013-07-11 MED ORDER — PREDNISONE (PAK) 10 MG PO TABS
10.0000 mg | ORAL_TABLET | ORAL | Status: AC
  Administered 2013-07-11: 18:00:00 10 mg via ORAL

## 2013-07-11 MED ORDER — FUROSEMIDE 20 MG PO TABS
20.0000 mg | ORAL_TABLET | Freq: Every day | ORAL | Status: DC
  Administered 2013-07-11 – 2013-07-12 (×2): 20 mg via ORAL
  Filled 2013-07-11: qty 1

## 2013-07-11 MED ORDER — PREDNISONE (PAK) 10 MG PO TABS
20.0000 mg | ORAL_TABLET | Freq: Every evening | ORAL | Status: AC
  Administered 2013-07-11: 20 mg via ORAL

## 2013-07-11 MED ORDER — PREDNISONE (PAK) 10 MG PO TABS
10.0000 mg | ORAL_TABLET | Freq: Three times a day (TID) | ORAL | Status: DC
  Administered 2013-07-12: 10:00:00 10 mg via ORAL

## 2013-07-11 MED ORDER — PREDNISONE (PAK) 10 MG PO TABS
10.0000 mg | ORAL_TABLET | ORAL | Status: AC
  Administered 2013-07-11: 12:00:00 10 mg via ORAL

## 2013-07-11 MED ORDER — PREDNISONE (PAK) 10 MG PO TABS: 20.0000 mg | ORAL_TABLET | Freq: Every evening | ORAL | Status: DC

## 2013-07-11 MED ORDER — PREDNISONE (PAK) 10 MG PO TABS
20.0000 mg | ORAL_TABLET | Freq: Every morning | ORAL | Status: AC
  Administered 2013-07-11: 11:00:00 20 mg via ORAL
  Filled 2013-07-11: qty 21

## 2013-07-11 MED ORDER — DOXYCYCLINE HYCLATE 100 MG PO TABS
100.0000 mg | ORAL_TABLET | Freq: Two times a day (BID) | ORAL | Status: DC
  Administered 2013-07-11 – 2013-07-12 (×3): 100 mg via ORAL
  Filled 2013-07-11 (×4): qty 1

## 2013-07-11 MED ORDER — CARVEDILOL 3.125 MG PO TABS
3.1250 mg | ORAL_TABLET | Freq: Two times a day (BID) | ORAL | Status: DC
  Administered 2013-07-11 – 2013-07-12 (×2): 3.125 mg via ORAL
  Filled 2013-07-11 (×4): qty 1

## 2013-07-11 NOTE — Progress Notes (Signed)
Patient Name: [name]      SUBJECTIVE: 79 yo male aldmitted from [facility] with respiratory failure 2/2 CAP and CHF-A/C systolic;  Complicated by hypotension attributed to steroid insufficiency and anemia Rx with stress dose steroid;    He has Hx of VT s/p RFCAx2 most recently 11/14; hx of ICD.  VT Rx with BB and sotalol    Also chornic hypotension, steroid dependent COPD  Past Medical History  Diagnosis Date  . Hypertension   . Coronary artery disease     a. CABG 03/2006. b. NSTEMI 03/2012 following VT likely demand ischemia - grafts patent at cath.  . Diabetes mellitus   . Ischemic cardiomyopathy     a. With chronic systolic CHF.  [name] Syncope and collapse   . Atrial fibrillation     a. Taking sotalol - intolerant to amiodarone. b. On Coumadin.  . Valvular heart disease     Mild to moderate mitral valve insufficiency and mild aortic insufficiency  . Chronic airway obstruction,steroid dependent   . Other and unspecified hyperlipidemia   . Chronic systolic heart failure     a. Ischemic cardiomyopathy ejection fraction 30-35% previously. b. Down to 15% by cath 03/2012.  [name] Cerebrovascular disease, unspecified   . Other mechanical complication of other internal orthopedic device, implant, and graft   . Carotid artery disease     s/p LCEA 2007.  [name] Head and neck cancer     S/P radiation therapy. "cancer free"  . Orthostatic hypotension   . Ventricular tachycardia     a. S/p ICD implantation. b. Recurrent in 03/2012 with medication adjustment. c. s/p RFCA 11/2012 (Dr. Fawn Kirk) ; d. Recent adm 06/2013 for VT/ICD shock after failing med rx with sotalol, mexiletine and metoprolol - repeat ablation 06-23-2013 by Dr Johney Frame.  . Anemia     a. 06/2013 notes: "Given weight loss and iron deficiency, outpatient colonoscopy should be considered once more clinically stable."  . Pneumonia     a. Qualified for home O2 with ambulation during 06/2013 adm.  . Weight loss     Scheduled  Meds:  Scheduled Meds: . arformoterol  15 mcg Nebulization Q12H  . aspirin  81 mg Oral Daily  . budesonide  0.5 mg Nebulization BID  . carvedilol  6.25 mg Oral BID WC  . digoxin  0.125 mg Oral Daily  . docusate sodium  100 mg Oral Daily  . ferrous sulfate  325 mg Oral BID WC  . furosemide  40 mg Oral Daily  . insulin aspart  0-15 Units Subcutaneous TID WC  . insulin aspart  0-5 Units Subcutaneous QHS  . loratadine  10 mg Oral Daily  . losartan  25 mg Oral Daily  . megestrol  400 mg Oral Daily  . montelukast  10 mg Oral QHS  . pantoprazole  40 mg Oral Daily  . potassium chloride  10 mEq Oral Daily  . simvastatin  40 mg Oral QHS  . sotalol  160 mg Oral Q12H  . warfarin  2.5 mg Oral QPC supper  . Warfarin - Physician Dosing Inpatient   Does not apply q1800   Continuous Infusions: . sodium chloride Stopped (07/10/13 1358)    PHYSICAL EXAM Filed Vitals:   07/10/13 2135 07/11/13 0512 07/11/13 0825 07/11/13 1016  BP: 113/54 109/55  89/48  Pulse: 85 75  74  Temp: 97.7 F (36.5 C) 97.4 F (36.3 C)  98 F (36.7 C)  TempSrc: Oral Oral  Oral  Resp: 18 20    Height:      Weight:  138 lb 3.7 oz (62.7 kg)    SpO2: 96% 97% 96% 98%    General appearance: alert, cooperative and no distress Neck: JVD - 8 cm above sternal notch Lungs: clear to auscultation bilaterally Heart: regular rate and rhythm, S1, S2 normal, no murmur, click, rub or gallop Extremities: edema  tr Skin: Skin color, texture, turgor normal. No rashes or lesions Neurologic: Alert and oriented X 3, normal strength and tone. Normal symmetric reflexes. Normal coordination and gait    TELEMETRY: Reviewed telemetry pt in NSR:    Intake/Output Summary (Last 24 hours) at 07/11/13 1035 Last data filed at 07/11/13 1610  Gross per 24 hour  Intake   1070 ml  Output   2525 ml  Net  -1455 ml    LABS: Basic Metabolic Panel:  Recent Labs Lab 07/05/13 0502 07/06/13 0500 07/07/13 0430 07/08/13 0425 07/09/13 0417  07/10/13 0930 07/11/13 0405  NA 137 139 140 140 136 136 136  K 4.4 3.5 3.5 3.6 3.9 4.5 4.2  CL 102 104 102 100 95* 95* 97  CO2 23 27 29 29 29 31  32  GLUCOSE 130* 114* 104* 138* 120* 166* 112*  BUN 18 20 21  24* 25* 23 26*  CREATININE 0.78 0.70 0.70 0.71 0.76 0.78 0.79  CALCIUM 9.2 8.5 8.7 8.7 9.0 9.1 8.5  MG 2.4 2.4  --   --   --   --   --    Cardiac Enzymes: No results found for this basename: CKTOTAL, CKMB, CKMBINDEX, TROPONINI,  in the last 72 hours CBC:  Recent Labs Lab 07/05/13 0502 07/06/13 0500 07/06/13 1215 07/07/13 0430 07/08/13 0425 07/09/13 0417  WBC 18.9* 12.8* 18.3* 13.7* 7.0 11.2*  HGB 7.7* 6.6* 8.8* 8.5* 9.3* 9.4*  HCT 24.9* 22.2* 29.1* 28.0* 30.0* 31.6*  MCV 78.1 79.3 80.6 80.5 79.6 80.6  PLT 294 264 304 297 313 340   PROTIME:  Recent Labs  07/09/13 0417 07/10/13 0505 07/11/13 0405  LABPROT 22.1* 23.0* 22.4*  INR 2.00* 2.11* 2.04*   Liver Function Tests: No results found for this basename: AST, ALT, ALKPHOS, BILITOT, PROT, ALBUMIN,  in the last 72 hours No results found for this basename: LIPASE, AMYLASE,  in the last 72 hours BNP: BNP (last 3 results)  Recent Labs  07/03/13 2314  PROBNP 9510.0*   D-Dimer: No results found for this basename: DDIMER,  in the last 72 hours Hemoglobin A1C: No results found for this basename: HGBA1C,  in the last 72 hours    ASSESSMENT AND PLAN:  Principal Problem:   Sepsis(995.91) Active Problems:   Weight loss   Acute on chronic systolic CHF (congestive heart failure)   Pneumonia, organism unspecified   Septic shock chronic hypotension   Will change steroids IV?>>PO Change Abx>>PO Anticipate discharge in am  Signed, Sherryl Manges MD  07/11/2013

## 2013-07-12 ENCOUNTER — Encounter (HOSPITAL_COMMUNITY): Payer: Self-pay | Admitting: Physician Assistant

## 2013-07-12 DIAGNOSIS — J9620 Acute and chronic respiratory failure, unspecified whether with hypoxia or hypercapnia: Secondary | ICD-10-CM

## 2013-07-12 DIAGNOSIS — E2740 Unspecified adrenocortical insufficiency: Secondary | ICD-10-CM

## 2013-07-12 DIAGNOSIS — J189 Pneumonia, unspecified organism: Secondary | ICD-10-CM

## 2013-07-12 DIAGNOSIS — D649 Anemia, unspecified: Secondary | ICD-10-CM

## 2013-07-12 LAB — BASIC METABOLIC PANEL
BUN: 24 mg/dL — ABNORMAL HIGH (ref 6–23)
Calcium: 8.7 mg/dL (ref 8.4–10.5)
Chloride: 95 mEq/L — ABNORMAL LOW (ref 96–112)
Creatinine, Ser: 0.86 mg/dL (ref 0.50–1.35)
GFR calc Af Amer: >90 mL/min (ref >90)
Glucose, Bld: 148 mg/dL — ABNORMAL HIGH (ref 70–99)
Potassium: 5 mEq/L (ref 3.5–5.1)

## 2013-07-12 LAB — PROTIME-INR: Prothrombin Time: 23.4 seconds — ABNORMAL HIGH (ref 11.6–15.2)

## 2013-07-12 LAB — GLUCOSE, CAPILLARY
Glucose-Capillary: 130 mg/dL — ABNORMAL HIGH (ref 70–99)
Glucose-Capillary: 165 mg/dL — ABNORMAL HIGH (ref 70–99)

## 2013-07-12 MED ORDER — CARVEDILOL 3.125 MG PO TABS: 3.1250 mg | ORAL_TABLET | Freq: Two times a day (BID) | ORAL | Status: DC

## 2013-07-12 MED ORDER — POTASSIUM CHLORIDE ER 10 MEQ PO TBCR: 10.0000 meq | EXTENDED_RELEASE_TABLET | Freq: Every day | ORAL | Status: DC

## 2013-07-12 MED ORDER — DOXYCYCLINE HYCLATE 100 MG PO TABS: 100.0000 mg | ORAL_TABLET | Freq: Two times a day (BID) | ORAL | Status: DC

## 2013-07-12 MED ORDER — PREDNISONE (PAK) 10 MG PO TABS: ORAL_TABLET | Freq: Every day | ORAL | Status: DC

## 2013-07-12 MED ORDER — PREDNISONE 5 MG PO TABS: 2.5000 mg | ORAL_TABLET | Freq: Every day | ORAL | Status: AC

## 2013-07-12 NOTE — Discharge Summary (Signed)
Discharge Summary   Patient ID: [name],  MRN: [id number], DOB/AGE: 1929-03-28 79 y.o.  Admit date: 07/03/2013 Discharge date: 07/12/2013  Primary Physician: Louie Boston, MD Primary Cardiologist: Sharene Skeans MD Primary Electrophysiologist: Shela Commons. Allred, MD  Discharge Diagnoses Principal Problem:   Septic shock Active Problems:   Acute-on-chronic respiratory failure   DM   DYSLIPIDEMIA   HYPERTENSION   ISCHEMIC CARDIOMYOPATHY   VENTRICULAR TACHYCARDIA   ICD-St.Jude   Coronary artery disease   Carotid artery disease   Atrial fibrillation   NSTEMI (non-ST elevated myocardial infarction)   Warfarin anticoagulation   Hx of CABG   Protein-calorie malnutrition, severe   Weight loss   Acute on chronic systolic CHF (congestive heart failure)   HCAP (healthcare-associated pneumonia)   Adrenal insufficiency   Normocytic anemia   Allergies Allergies  Allergen Reactions  . Amiodarone     Intolerant    Diagnostic Studies/Procedures  Serial PA/Lateral and portable CXRs (x 8) from 11/28 to 12/4. Radiograph on 12/4 indicated stable bibasilar atelectasis.   CENTRAL VENOUS CATHETER PLACEMENT - 07/05/13  Evaluation  Blood flow good  Complications: No apparent complications  Patient did tolerate procedure well.  Chest X-ray ordered to verify placement. CXR: normal.  Procedure performed under direct supervision of Dr. Molli Knock and with ultrasound guidance for real time vessel cannulation.   History of Present Illness [name] is a 79 y.o. male who was admitted to [facility] with the above problem list.   He is a 79 year old male with past medical history significant for coronary artery disease status post CABG, type 2 diabetes mellitus, atrial fibrillation, VT status post ICD and ablation x 2 and severe COPD/chronic respiratory failure and chronically extended steroid taper. He previously undergone VT ablation in early November 2014. He was discharged on  06/26/13 after this procedure. He began experiencing worsening shortness of breath and presented to [facility] on 07/03/13 and respiratory failure. He was subsequently transferred to [facility] for ongoing management.  Initial imaging indicated findings consistent with healthcare acquired pneumonia. The patient's lactic acid was elevated and he met criteria for septic shock. He had resultant adrenal insufficiency. Despite the patient's respiratory failure, his lung status was noted to be at baseline. An initial portable chest x-ray indicated cardiomegaly, COPD improved pulmonary edema when compared to tracings earlier in the month. He did have mild troponin elevation felt to be a type 2 NSTEMI. He was initiated on broad-spectrum antibiotics, stress dose steroids and low-dose dopamine for blood pressure support. Antihypertensives were held including diuretics and sotalol. IV fluid hydration was gently provided. Warfarin was continued for anticoagulation.  Hospital Course   Critical care medicine was consulted for the ongoing management of the patient's underlying respiratory conditions. He was continued on home inhalers and broad-spectrum antibiotics. She was provided. Stress dose steroids and dopamine doses were titrated. Dopamine was eventually weaned and the patient's blood pressure was independently supported. Volume status did turn up slightly and he received IV Lasix with appropriate diuresis. He did have an episode of atrial fibrillation with RVR shortly into the admission, and sotalol was resumed with improvement. He had a swallow evaluation and was diagnosed with dysphasia. Home health speech and language therapy was recommended. PICC line was attempted in order to monitor co-ox/CVP, however was unable to be placed due to obstruction of require placement. A central line was subsequently placed. There was concern regarding risk of infection of the patient ICD with central venous access.  The  patient's central line was subsequently withdrawn. The patient did have evidence of a normocytic anemia which gradually trended up through course of the admission. Formal workup as an outpatient was recommended. The patient was known to have chronic hypotension. The patient's BP did improve with stress dose steroids. This was transitioned to an oral formulation. Broad spectrum antibiotics were transitioned to doxycycline. His respiratory status improved. He was transferred to telemetry in stable condition. He was evaluated by physical therapy recommended home health PT.  He was evaluated by Dr. Graciela Husbands over the weekend. It is felt to be stable for discharge today. Dr. Graciela Husbands recommended steroid dose taper as outlined below. At the end of the taper, he will resume his extended steroid dose taper at 2.5 mg daily. This has been explained to the patient. He will finish a seven-day course of doxycycline. He will follow-up within 1 week as part of transitional care management. The patient's INR remained therapeutic throughout the admission. Outpatient Coumadin regimen will be resumed. Both carvedilol and sotalol will be resumed for the treatment of VT. This information has been clearly outlined in the discharge AVS.  Discharge Vitals:  Blood pressure 105/50, pulse 83, temperature 97.2 F (36.2 C), temperature source Axillary, resp. rate 16, height 5\' 9"  (1.753 m), weight 62.1 kg (136 lb 14.5 oz), SpO2 97.00%.   Weight change: -1.8 kg (-3 lb 15.5 oz)  Labs:  Recent Labs Lab 07/10/13 0930 07/11/13 0405 07/12/13 0410  NA 136 136 132*  K 4.5 4.2 5.0  CL 95* 97 95*  CO2 31 32 27  BUN 23 26* 24*  CREATININE 0.78 0.79 0.86  CALCIUM 9.1 8.5 8.7  GLUCOSE 166* 112* 148*   Disposition:  Discharge Orders   Future Appointments Provider Department Dept Phone   07/17/2013 10:10 AM Beatrice Lecher, PA-C [facility] [phone]   Future Orders Complete By Expires   Diet - low sodium  heart healthy  As directed    Increase activity slowly  As directed          Follow-up Information   Follow up with Tereso Newcomer, PA-C On 07/17/2013. (At 10:10 AM for post-hospital follow-up. )    Specialty:  Physician Assistant   Contact information:   [address] [place] [address] [phone]       Follow up with TAPPER,DAVID B, MD. Schedule an appointment as soon as possible for a visit in 1 week. (For general post-hospital follow-up and management of your steroids. )    Specialty:  Family Medicine   Contact information:   [address], [place] [address] [phone]       Follow up with [place] CARD MOREHEAD. (Office will call with an appointment date & time)    Contact information:   [address] [place] [address]       Follow up with TAPPER,DAVID B, MD. Schedule an appointment as soon as possible for a visit in 1 week. (For general post-hospital follow-up and work-up of anemia.)    Specialty:  Family Medicine   Contact information:   [address] [place] [address] [phone]       Discharge Medications:    Medication List    STOP taking these medications       metoprolol succinate 25 MG 24 hr tablet  Commonly known as:  TOPROL-XL     spironolactone 25 MG tablet  Commonly known as:  ALDACTONE  TAKE these medications       acetaminophen 500 MG tablet  Commonly known as:  TYLENOL  Take 1,000 mg by mouth 2 (two) times daily as needed (pain).     albuterol 108 (90 BASE) MCG/ACT inhaler  Commonly known as:  PROVENTIL HFA;VENTOLIN HFA  Inhale 2 puffs into the lungs every 6 (six) hours as needed for wheezing or shortness of breath.     budesonide 0.5 MG/2ML nebulizer solution  Commonly known as:  PULMICORT  Take 0.5 mg by nebulization 2 (two) times daily.     carvedilol 3.125 MG tablet  Commonly known as:  COREG  Take 1 tablet (3.125 mg total) by mouth 2 (two) times daily with a meal.      digoxin 0.125 MG tablet  Commonly known as:  LANOXIN  Take 1 tablet (0.125 mg total) by mouth daily.     doxycycline 100 MG tablet  Commonly known as:  VIBRA-TABS  Take 1 tablet (100 mg total) by mouth every 12 (twelve) hours.     DSS 100 MG Caps  Take 100 mg by mouth daily.     ferrous sulfate 325 (65 FE) MG tablet  Take 1 tablet (325 mg total) by mouth 2 (two) times daily with a meal.     formoterol 20 MCG/2ML nebulizer solution  Commonly known as:  PERFOROMIST  Take 20 mcg by nebulization 2 (two) times daily.     furosemide 40 MG tablet  Commonly known as:  LASIX  Take 1 tablet (40 mg total) by mouth daily.     loratadine 10 MG tablet  Commonly known as:  CLARITIN  Take 10 mg by mouth daily. For allergies     losartan 25 MG tablet  Commonly known as:  COZAAR  Take 0.5 tablets (12.5 mg total) by mouth daily.     megestrol 40 MG/ML suspension  Commonly known as:  MEGACE  Take 400 mg by mouth daily. 10 mls (2 teaspoonsful)     montelukast 10 MG tablet  Commonly known as:  SINGULAIR  Take 10 mg by mouth at bedtime.     nitroGLYCERIN 0.4 MG SL tablet  Commonly known as:  NITROSTAT  Place 1 tablet (0.4 mg total) under the tongue every 5 (five) minutes as needed for chest pain.     omeprazole 20 MG capsule  Commonly known as:  PRILOSEC  Take 20 mg by mouth daily.     potassium chloride 10 MEQ tablet  Commonly known as:  K-DUR  Take 1 tablet (10 mEq total) by mouth daily. Take 2 tablets, total 20 mEq on Monday, Wednesday and Friday.     predniSONE 10 MG tablet  Commonly known as:  STERAPRED UNI-PAK  Take by mouth daily. 40mg  on 07/13/13, 30mg  on 07/14/13, 20mg  on 07/15/13, 10mg  on 07/16/13, then resume prior 2.5mg  dosing     predniSONE 5 MG tablet  Commonly known as:  DELTASONE  Take 0.5 tablets (2.5 mg total) by mouth daily. Continue after prednisone taper.     simvastatin 40 MG tablet  Commonly known as:  ZOCOR  Take 40 mg by mouth at bedtime.     sotalol 160  MG tablet  Commonly known as:  BETAPACE  Take 1 tablet (160 mg total) by mouth 2 (two) times daily.     warfarin 5 MG tablet  Commonly known as:  COUMADIN  Take 0.5 tablets (2.5 mg total) by mouth at bedtime.       Outstanding Labs/Studies:  None  Duration of Discharge Encounter: Greater than 30 minutes including physician time.  Signed, R. Hurman Horn, PA-C 07/12/2013, 4:17 PM

## 2013-07-12 NOTE — Progress Notes (Signed)
Patient Name: [name]      SUBJECTIVE: 79 yo male aldmitted from [facility] with respiratory failure 2/2 CAP and CHF-A/C systolic;  Complicated by hypotension attributed to steroid insufficiency and anemia Rx with stress dose steroid;    He has Hx of VT s/p RFCAx2 most recently 11/14; hx of ICD.  VT Rx with BB and sotalol    Also chornic hypotension, steroid dependent COPD  Feel relatively well  Past Medical History  Diagnosis Date  . Hypertension   . Coronary artery disease     a. CABG 03/2006. b. NSTEMI 03/2012 following VT likely demand ischemia - grafts patent at cath.  . Diabetes mellitus   . Ischemic cardiomyopathy     a. With chronic systolic CHF.  [name] Syncope and collapse   . Atrial fibrillation     a. Taking sotalol - intolerant to amiodarone. b. On Coumadin.  . Valvular heart disease     Mild to moderate mitral valve insufficiency and mild aortic insufficiency  . Chronic airway obstruction,steroid dependent   . Other and unspecified hyperlipidemia   . Chronic systolic heart failure     a. Ischemic cardiomyopathy ejection fraction 30-35% previously. b. Down to 15% by cath 03/2012.  [name] Cerebrovascular disease, unspecified   . Other mechanical complication of other internal orthopedic device, implant, and graft   . Carotid artery disease     s/p LCEA 2007.  [name] Head and neck cancer     S/P radiation therapy. "cancer free"  . Orthostatic hypotension   . Ventricular tachycardia     a. S/p ICD implantation. b. Recurrent in 03/2012 with medication adjustment. c. s/p RFCA 11/2012 (Dr. Fawn Kirk) ; d. Recent adm 06/2013 for VT/ICD shock after failing med rx with sotalol, mexiletine and metoprolol - repeat ablation 06-23-2013 by Dr Johney Frame.  . Anemia     a. 06/2013 notes: "Given weight loss and iron deficiency, outpatient colonoscopy should be considered once more clinically stable."  . Pneumonia     a. Qualified for home O2 with ambulation during 06/2013 adm.  . Weight loss      Scheduled Meds:  Scheduled Meds: . arformoterol  15 mcg Nebulization Q12H  . aspirin  81 mg Oral Daily  . budesonide  0.5 mg Nebulization BID  . carvedilol  3.125 mg Oral BID WC  . digoxin  0.125 mg Oral Daily  . docusate sodium  100 mg Oral Daily  . doxycycline  100 mg Oral Q12H  . ferrous sulfate  325 mg Oral BID WC  . furosemide  20 mg Oral Daily  . insulin aspart  0-15 Units Subcutaneous TID WC  . insulin aspart  0-5 Units Subcutaneous QHS  . loratadine  10 mg Oral Daily  . losartan  25 mg Oral Daily  . megestrol  400 mg Oral Daily  . montelukast  10 mg Oral QHS  . pantoprazole  40 mg Oral Daily  . potassium chloride  10 mEq Oral Daily  . predniSONE  10 mg Oral 3 x daily with food  . [START ON 07/13/2013] predniSONE  10 mg Oral 4X daily taper  . predniSONE  20 mg Oral Nightly  . simvastatin  40 mg Oral QHS  . sotalol  160 mg Oral Q12H  . warfarin  2.5 mg Oral QPC supper  . Warfarin - Physician Dosing Inpatient   Does not apply q1800   Continuous Infusions: . sodium chloride Stopped (07/10/13 1358)  PHYSICAL EXAM Filed Vitals:   07/11/13 1016 07/11/13 1339 07/11/13 2002 07/12/13 0452  BP: 89/48 95/42 110/55 98/52  Pulse: 74 82 88 81  Temp: 98 F (36.7 C) 97.9 F (36.6 C) 97.5 F (36.4 C) 97.6 F (36.4 C)  TempSrc: Oral Oral Oral Oral  Resp:  18 20 20   Height:      Weight:    136 lb 14.5 oz (62.1 kg)  SpO2: 98% 97% 100% 98%    General appearance: alert, cooperative and no distress Neck: JVD - 8 cm above sternal notch Lungs: cdecreased breathsounds on R Heart: regular rate and rhythm, S1, S2 normal, no murmur, click, rub or gallop Extremities: edema  tr Skin: Skin color, texture, turgor normal. No rashes or lesions Neurologic: Alert and oriented X 3, normal strength and tone. Normal symmetric reflexes. Normal coordination and gait    TELEMETRY: Reviewed telemetry pt in NSR:    Intake/Output Summary (Last 24 hours) at 07/12/13 0813 Last data filed  at 07/12/13 0656  Gross per 24 hour  Intake   1020 ml  Output   1900 ml  Net   -880 ml    LABS: Basic Metabolic Panel:  Recent Labs Lab 07/06/13 0500 07/07/13 0430 07/08/13 0425 07/09/13 0417 07/10/13 0930 07/11/13 0405 07/12/13 0410  NA 139 140 140 136 136 136 132*  K 3.5 3.5 3.6 3.9 4.5 4.2 5.0  CL 104 102 100 95* 95* 97 95*  CO2 27 29 29 29 31  32 27  GLUCOSE 114* 104* 138* 120* 166* 112* 148*  BUN 20 21 24* 25* 23 26* 24*  CREATININE 0.70 0.70 0.71 0.76 0.78 0.79 0.86  CALCIUM 8.5 8.7 8.7 9.0 9.1 8.5 8.7  MG 2.4  --   --   --   --   --   --    Cardiac Enzymes: No results found for this basename: CKTOTAL, CKMB, CKMBINDEX, TROPONINI,  in the last 72 hours CBC:  Recent Labs Lab 07/06/13 0500 07/06/13 1215 07/07/13 0430 07/08/13 0425 07/09/13 0417  WBC 12.8* 18.3* 13.7* 7.0 11.2*  HGB 6.6* 8.8* 8.5* 9.3* 9.4*  HCT 22.2* 29.1* 28.0* 30.0* 31.6*  MCV 79.3 80.6 80.5 79.6 80.6  PLT 264 304 297 313 340   PROTIME:  Recent Labs  07/10/13 0505 07/11/13 0405 07/12/13 0410  LABPROT 23.0* 22.4* 23.4*  INR 2.11* 2.04* 2.16*   Liver Function Tests: No results found for this basename: AST, ALT, ALKPHOS, BILITOT, PROT, ALBUMIN,  in the last 72 hours No results found for this basename: LIPASE, AMYLASE,  in the last 72 hours BNP: BNP (last 3 results)  Recent Labs  07/03/13 2314  PROBNP 9510.0*   D-Dimer: No results found for this basename: DDIMER,  in the last 72 hours Hemoglobin A1C: No results found for this basename: HGBA1C,  in the last 72 hours    ASSESSMENT AND PLAN:  Principal Problem:   Sepsis(995.91) Active Problems:   Weight loss   Acute on chronic systolic CHF (congestive heart failure)   Pneumonia, organism unspecified   Septic shock chronic hypotension ICD in place  stop ASA Ok to discharge Office will arrange followup  prob 3weeks SKo and [facility], Sherryl Manges MD  07/12/2013

## 2013-07-13 ENCOUNTER — Ambulatory Visit (INDEPENDENT_AMBULATORY_CARE_PROVIDER_SITE_OTHER): Payer: Medicare Other | Admitting: *Deleted

## 2013-07-13 ENCOUNTER — Telehealth: Payer: Self-pay | Admitting: *Deleted

## 2013-07-13 DIAGNOSIS — Z7901 Long term (current) use of anticoagulants: Secondary | ICD-10-CM

## 2013-07-13 DIAGNOSIS — I4891 Unspecified atrial fibrillation: Secondary | ICD-10-CM

## 2013-07-13 LAB — POCT INR: INR: 1.8

## 2013-07-13 NOTE — Telephone Encounter (Signed)
INR - 1.8 / PT - 21.9 / please call Angie with instructions / tgs

## 2013-07-13 NOTE — Telephone Encounter (Signed)
See coumadin note. 

## 2013-07-16 ENCOUNTER — Ambulatory Visit (INDEPENDENT_AMBULATORY_CARE_PROVIDER_SITE_OTHER): Payer: Medicare Other | Admitting: *Deleted

## 2013-07-16 DIAGNOSIS — I4891 Unspecified atrial fibrillation: Secondary | ICD-10-CM

## 2013-07-16 DIAGNOSIS — Z7901 Long term (current) use of anticoagulants: Secondary | ICD-10-CM

## 2013-07-16 LAB — POCT INR: INR: 2.2

## 2013-07-17 ENCOUNTER — Ambulatory Visit (INDEPENDENT_AMBULATORY_CARE_PROVIDER_SITE_OTHER): Payer: Medicare Other | Admitting: Physician Assistant

## 2013-07-17 ENCOUNTER — Encounter: Payer: Self-pay | Admitting: Physician Assistant

## 2013-07-17 VITALS — BP 102/60 | HR 80 | Ht 69.0 in | Wt 139.0 lb

## 2013-07-17 DIAGNOSIS — I2510 Atherosclerotic heart disease of native coronary artery without angina pectoris: Secondary | ICD-10-CM

## 2013-07-17 DIAGNOSIS — I2589 Other forms of chronic ischemic heart disease: Secondary | ICD-10-CM

## 2013-07-17 DIAGNOSIS — I472 Ventricular tachycardia, unspecified: Secondary | ICD-10-CM

## 2013-07-17 DIAGNOSIS — E785 Hyperlipidemia, unspecified: Secondary | ICD-10-CM

## 2013-07-17 DIAGNOSIS — D649 Anemia, unspecified: Secondary | ICD-10-CM

## 2013-07-17 DIAGNOSIS — I10 Essential (primary) hypertension: Secondary | ICD-10-CM

## 2013-07-17 DIAGNOSIS — J449 Chronic obstructive pulmonary disease, unspecified: Secondary | ICD-10-CM

## 2013-07-17 DIAGNOSIS — I5022 Chronic systolic (congestive) heart failure: Secondary | ICD-10-CM

## 2013-07-17 DIAGNOSIS — I4891 Unspecified atrial fibrillation: Secondary | ICD-10-CM

## 2013-07-17 LAB — CBC WITH DIFFERENTIAL/PLATELET
Basophils Absolute: 0 10*3/uL (ref 0.0–0.1)
Eosinophils Absolute: 0 10*3/uL (ref 0.0–0.7)
HCT: 33.7 % — ABNORMAL LOW (ref 39.0–52.0)
Hemoglobin: 10.6 g/dL — ABNORMAL LOW (ref 13.0–17.0)
Lymphocytes Relative: 4.7 % — ABNORMAL LOW (ref 12.0–46.0)
Lymphs Abs: 0.6 10*3/uL — ABNORMAL LOW (ref 0.7–4.0)
MCHC: 31.6 g/dL (ref 30.0–36.0)
Neutro Abs: 11.2 10*3/uL — ABNORMAL HIGH (ref 1.4–7.7)
Platelets: 409 10*3/uL — ABNORMAL HIGH (ref 150.0–400.0)
RDW: 22 % — ABNORMAL HIGH (ref 11.5–14.6)
WBC: 12.9 10*3/uL — ABNORMAL HIGH (ref 4.5–10.5)

## 2013-07-17 LAB — BASIC METABOLIC PANEL
BUN: 21 mg/dL (ref 6–23)
CO2: 25 mEq/L (ref 19–32)
Calcium: 8.8 mg/dL (ref 8.4–10.5)
Creatinine, Ser: 1.1 mg/dL (ref 0.4–1.5)
GFR: 70.32 mL/min (ref >60.00)
Glucose, Bld: 74 mg/dL (ref 70–99)
Sodium: 132 mEq/L — ABNORMAL LOW (ref 135–145)

## 2013-07-17 MED ORDER — CARVEDILOL 3.125 MG PO TABS: 3.1250 mg | ORAL_TABLET | Freq: Two times a day (BID) | ORAL | Status: AC

## 2013-07-17 NOTE — Progress Notes (Signed)
[address] [place], [place]  [address] Phone: [phone] Fax:  [phone]  Date:  07/17/2013   ID:  [name], DOB 05/26/1929, MRN [id number]  PCP:  Louie Boston, MD  Cardiologist:  Dr. Prentice Docker    Electrophysiologist:  Dr. Hillis Range    History of Present Illness: [name] is a 79 y.o. male with a hx of CAD, s/p CABG, ischemic cardiomyopathy, chronic systolic CHF, VTach s/p prior RFCA, AFib, mitral regurgitation, diabetes mellitus, HTN, head and neck CA.  LHC (02/2012): oLM 80%, RCA occluded, LIMA-LAD patent, SVG-OM patent, SVG-PDA patent. Echo (12/2012): Mild LVH, EF 30-35%, inferolateral, inferior, apical, distal anteroseptal akinesis, mild AI, mod MR, mod LAE, mildly reduced RVSF, trivial TR.  Patient was admitted 11/16-11/21 with recurrent symptomatic VTach and ICD shocks. Patient had previously not tolerated amiodarone therapy and failed sotalol, mexiletine and metoprolol. He underwent repeat ventricular tachycardia RFCA (06/23/2013). He was noted to be anemic during that admission and followup with primary care was recommended. He was continued on sotalol for his atrial fibrillation. He remained on Coumadin.   He was admitted again 11/28-12/7 with healthcare acquired pneumonia and associated sepsis. He was initially admitted to [facility] and then transferred to [facility]. His course was complicated by adrenal insufficiency requiring stress dose steroids. He also required dopamine for blood pressure support. He was followed by critical care and treated with antibiotics. He had an episode of atrial fibrillation with RVR. Sotalol was resumed. He otherwise maintained NSR. He did receive transfusion with PRBCs. His hemoglobin remained stable and again outpatient management was recommended.  Patient is here with his wife today. His breathing is much improved. He describes NYHA class IIb symptoms. He has slept on 2 pillows for years. He denies any  change. He denies PND. He denies pedal edema. He denies chest pain or syncope. He denies any ICD shocks. He has a chronic cough productive of clear sputum. There have been no changes.  Recent Labs: 07/03/2013: Pro B Natriuretic peptide (BNP) 9510.0*  07/04/2013: ALT 8  07/09/2013: Hemoglobin 9.4*  07/12/2013: Creatinine 0.86; Potassium 5.0   Wt Readings from Last 3 Encounters:  07/17/13 139 lb (63.05 kg)  07/12/13 136 lb 14.5 oz (62.1 kg)  06/26/13 139 lb (63.05 kg)     Past Medical History  Diagnosis Date  . Hypertension   . Coronary artery disease     a. CABG 03/2006. b. NSTEMI 03/2012 following VT likely demand ischemia - grafts patent at cath.  . Diabetes mellitus   . Ischemic cardiomyopathy     a. With chronic systolic CHF.  [name] Syncope and collapse   . Atrial fibrillation     a. Taking sotalol - intolerant to amiodarone. b. On Coumadin.  . Valvular heart disease     Mild to moderate mitral valve insufficiency and mild aortic insufficiency  . Chronic airway obstruction,steroid dependent   . Other and unspecified hyperlipidemia   . Chronic systolic heart failure     a. Ischemic cardiomyopathy ejection fraction 30-35% previously. b. Down to 15% by cath 03/2012.  [name] Cerebrovascular disease, unspecified   . Other mechanical complication of other internal orthopedic device, implant, and graft   . Carotid artery disease     s/p LCEA 2007.  [name] Head and neck cancer     S/P radiation therapy. "cancer free"  . Orthostatic hypotension   . Ventricular tachycardia     a. S/p ICD implantation. b. Recurrent in 03/2012  with medication adjustment. c. s/p RFCA 11/2012 (Dr. Fawn Kirk) ; d. Recent adm 06/2013 for VT/ICD shock after failing med rx with sotalol, mexiletine and metoprolol - repeat ablation 06-23-2013 by Dr Johney Frame.  . Anemia     a. 06/2013 notes: "Given weight loss and iron deficiency, outpatient colonoscopy should be considered once more clinically stable."  . Pneumonia     a. Qualified  for home O2 with ambulation during 06/2013 adm.  . Weight loss     Current Outpatient Prescriptions  Medication Sig Dispense Refill  . acetaminophen (TYLENOL) 500 MG tablet Take 1,000 mg by mouth 2 (two) times daily as needed (pain).      [name] albuterol (PROVENTIL HFA;VENTOLIN HFA) 108 (90 BASE) MCG/ACT inhaler Inhale 2 puffs into the lungs every 6 (six) hours as needed for wheezing or shortness of breath.      . budesonide (PULMICORT) 0.5 MG/2ML nebulizer solution Take 0.5 mg by nebulization 2 (two) times daily.      . carvedilol (COREG) 3.125 MG tablet Take 1 tablet (3.125 mg total) by mouth 2 (two) times daily with a meal.  60 tablet  3  . digoxin (LANOXIN) 0.125 MG tablet Take 1 tablet (0.125 mg total) by mouth daily.  30 tablet  2  . docusate sodium 100 MG CAPS Take 100 mg by mouth daily.  10 capsule  0  . doxycycline (VIBRA-TABS) 100 MG tablet Take 1 tablet (100 mg total) by mouth every 12 (twelve) hours.  11 tablet  0  . ferrous sulfate 325 (65 FE) MG tablet Take 1 tablet (325 mg total) by mouth 2 (two) times daily with a meal.    3  . formoterol (PERFOROMIST) 20 MCG/2ML nebulizer solution Take 20 mcg by nebulization 2 (two) times daily.      . furosemide (LASIX) 40 MG tablet Take 1 tablet (40 mg total) by mouth daily.      [name] loratadine (CLARITIN) 10 MG tablet Take 10 mg by mouth daily. For allergies      . losartan (COZAAR) 25 MG tablet Take 0.5 tablets (12.5 mg total) by mouth daily.  30 tablet  0  . megestrol (MEGACE) 40 MG/ML suspension Take 400 mg by mouth daily. 10 mls (2 teaspoonsful)      . montelukast (SINGULAIR) 10 MG tablet Take 10 mg by mouth at bedtime.       . nitroGLYCERIN (NITROSTAT) 0.4 MG SL tablet Place 1 tablet (0.4 mg total) under the tongue every 5 (five) minutes as needed for chest pain.  25 each  3  . omeprazole (PRILOSEC) 20 MG capsule Take 20 mg by mouth daily.      . potassium chloride (K-DUR) 10 MEQ tablet Take 1 tablet (10 mEq total) by mouth daily. Take 2  tablets, total 20 mEq on Monday, Wednesday and Friday.  30 tablet  3  . predniSONE (DELTASONE) 5 MG tablet Take 0.5 tablets (2.5 mg total) by mouth daily. Continue after prednisone taper.      . predniSONE (STERAPRED UNI-PAK) 10 MG tablet Take by mouth daily. 40mg  on 07/13/13, 30mg  on 07/14/13, 20mg  on 07/15/13, 10mg  on 07/16/13, then resume prior 2.5mg  dosing  10 tablet  0  . simvastatin (ZOCOR) 40 MG tablet Take 40 mg by mouth at bedtime.       . sotalol (BETAPACE) 160 MG tablet Take 1 tablet (160 mg total) by mouth 2 (two) times daily.  60 tablet  6  . warfarin (COUMADIN) 5 MG tablet Take 0.5  tablets (2.5 mg total) by mouth at bedtime.  40 tablet  11   No current facility-administered medications for this visit.    Allergies:   Amiodarone   Social History:  The patient  reports that he quit smoking about 22 years ago. His smoking use included Cigarettes. He has a 120 pack-year smoking history. He has never used smokeless tobacco. He reports that he does not drink alcohol or use illicit drugs.   Family History:  The patient's family history includes CAD in an other family member.   ROS:  Please see the history of present illness.   He has occasional constipation.   All other systems reviewed and negative.   PHYSICAL EXAM: VS:  BP 102/60  Pulse 80  Ht 5\' 9"  (1.753 m)  Wt 139 lb (63.05 kg)  BMI 20.52 kg/m2 Chronically ill-appearing male on continuous O2 and in no acute distress HEENT: normal Neck: no JVD Cardiac:  normal S1, S2; RRR; no murmur Lungs:  Decreased breath sounds bilaterally, no wheezing, rhonchi or rales Abd: soft, nontender, no hepatomegaly Ext: no edema Skin: warm and dry Neuro:  CNs 2-12 intact, no focal abnormalities noted  EKG:  NSR, HR 80, IVCD, inf TWI, QTc 491     ASSESSMENT AND PLAN:  1. Chronic Systolic CHF: Volume appears stable. Continue current therapy. Check a basic metabolic panel today. 2. Ischemic Cardiomyopathy: Continue current dose of beta  blocker, digoxin, ARB. His blood pressure is too soft to further titrate medications or resume spironolactone. 3. CAD: No angina. Continue statin, warfarin. 4. Ventricular Tachycardia, status post RFCA:  Doing well without apparent recurrence. Continue beta blocker 5. Atrial Fibrillation: Maintaining NSR. He remains on Coumadin. Check a follow up CBC today. Continue sotalol. QTC is acceptable. 6. Pneumonia: Resolved. Continue followup with primary care. 7. Anemia: Repeat CBC today. Follow up with primary care as planned. 8. COPD: He is on his daily maintenance dose of prednisone now. Continue current therapy. 9. Hypertension: Controlled. 10. Hyperlipidemia: Continue statin. 11. Disposition: Keep follow up with Dr. Johney Frame next month as planned  Signed, Tereso Newcomer, PA-C  07/17/2013 10:08 AM

## 2013-07-17 NOTE — Patient Instructions (Signed)
LAB WORK TODAY, BMET, CBC W/DIFF  MAKE SURE TO FOLLOW UP NEXT WITH YOUR PRIMARY CARE PHYSICIAN ABOUT YOUR ANEMIA  MAKE SURE TO KEEP YOUR APPT WITH DR. ALLRED 08/10/13  A REFILL FOR COREG WAS SENT TO [facility]

## 2013-07-20 ENCOUNTER — Telehealth: Payer: Self-pay

## 2013-07-20 ENCOUNTER — Ambulatory Visit (INDEPENDENT_AMBULATORY_CARE_PROVIDER_SITE_OTHER): Payer: Medicare Other | Admitting: *Deleted

## 2013-07-20 DIAGNOSIS — I4891 Unspecified atrial fibrillation: Secondary | ICD-10-CM

## 2013-07-20 DIAGNOSIS — Z7901 Long term (current) use of anticoagulants: Secondary | ICD-10-CM

## 2013-07-20 LAB — POCT INR: INR: 2.7

## 2013-07-20 NOTE — Telephone Encounter (Signed)
Angie from [facility] called to report INR 2.7 today.

## 2013-07-20 NOTE — Telephone Encounter (Signed)
See coumadin note. 

## 2013-08-03 ENCOUNTER — Ambulatory Visit (INDEPENDENT_AMBULATORY_CARE_PROVIDER_SITE_OTHER): Payer: Medicare Other | Admitting: *Deleted

## 2013-08-03 DIAGNOSIS — I4891 Unspecified atrial fibrillation: Secondary | ICD-10-CM

## 2013-08-03 DIAGNOSIS — Z7901 Long term (current) use of anticoagulants: Secondary | ICD-10-CM

## 2013-08-03 DIAGNOSIS — Z5181 Encounter for therapeutic drug level monitoring: Secondary | ICD-10-CM

## 2013-08-07 ENCOUNTER — Emergency Department (HOSPITAL_COMMUNITY): Payer: Medicare Other

## 2013-08-07 ENCOUNTER — Inpatient Hospital Stay (HOSPITAL_COMMUNITY)
Admission: EM | Admit: 2013-08-07 | Discharge: 2013-08-11 | DRG: 258 | Disposition: A | Discharge status: Hospice | Payer: Medicare Other | Attending: Internal Medicine | Admitting: Internal Medicine

## 2013-08-07 DIAGNOSIS — Z87891 Personal history of nicotine dependence: Secondary | ICD-10-CM

## 2013-08-07 DIAGNOSIS — J811 Chronic pulmonary edema: Secondary | ICD-10-CM | POA: Insufficient documentation

## 2013-08-07 DIAGNOSIS — I4891 Unspecified atrial fibrillation: Principal | ICD-10-CM | POA: Diagnosis present

## 2013-08-07 DIAGNOSIS — Z951 Presence of aortocoronary bypass graft: Secondary | ICD-10-CM

## 2013-08-07 DIAGNOSIS — I472 Ventricular tachycardia, unspecified: Secondary | ICD-10-CM | POA: Diagnosis present

## 2013-08-07 DIAGNOSIS — Z9981 Dependence on supplemental oxygen: Secondary | ICD-10-CM

## 2013-08-07 DIAGNOSIS — J9620 Acute and chronic respiratory failure, unspecified whether with hypoxia or hypercapnia: Secondary | ICD-10-CM | POA: Diagnosis present

## 2013-08-07 DIAGNOSIS — I509 Heart failure, unspecified: Secondary | ICD-10-CM | POA: Diagnosis present

## 2013-08-07 DIAGNOSIS — Z7901 Long term (current) use of anticoagulants: Secondary | ICD-10-CM

## 2013-08-07 DIAGNOSIS — I2589 Other forms of chronic ischemic heart disease: Secondary | ICD-10-CM | POA: Diagnosis present

## 2013-08-07 DIAGNOSIS — Z515 Encounter for palliative care: Secondary | ICD-10-CM

## 2013-08-07 DIAGNOSIS — I10 Essential (primary) hypertension: Secondary | ICD-10-CM

## 2013-08-07 DIAGNOSIS — I5023 Acute on chronic systolic (congestive) heart failure: Secondary | ICD-10-CM | POA: Diagnosis present

## 2013-08-07 DIAGNOSIS — Z95810 Presence of automatic (implantable) cardiac defibrillator: Secondary | ICD-10-CM | POA: Diagnosis present

## 2013-08-07 DIAGNOSIS — J189 Pneumonia, unspecified organism: Secondary | ICD-10-CM

## 2013-08-07 DIAGNOSIS — Z8589 Personal history of malignant neoplasm of other organs and systems: Secondary | ICD-10-CM

## 2013-08-07 DIAGNOSIS — E2740 Unspecified adrenocortical insufficiency: Secondary | ICD-10-CM

## 2013-08-07 DIAGNOSIS — J9601 Acute respiratory failure with hypoxia: Secondary | ICD-10-CM

## 2013-08-07 DIAGNOSIS — I080 Rheumatic disorders of both mitral and aortic valves: Secondary | ICD-10-CM | POA: Diagnosis present

## 2013-08-07 DIAGNOSIS — Z923 Personal history of irradiation: Secondary | ICD-10-CM

## 2013-08-07 DIAGNOSIS — I2510 Atherosclerotic heart disease of native coronary artery without angina pectoris: Secondary | ICD-10-CM | POA: Diagnosis present

## 2013-08-07 DIAGNOSIS — J441 Chronic obstructive pulmonary disease with (acute) exacerbation: Secondary | ICD-10-CM

## 2013-08-07 DIAGNOSIS — Z79899 Other long term (current) drug therapy: Secondary | ICD-10-CM

## 2013-08-07 DIAGNOSIS — Z66 Do not resuscitate: Secondary | ICD-10-CM | POA: Diagnosis not present

## 2013-08-07 DIAGNOSIS — E119 Type 2 diabetes mellitus without complications: Secondary | ICD-10-CM | POA: Diagnosis present

## 2013-08-07 DIAGNOSIS — E785 Hyperlipidemia, unspecified: Secondary | ICD-10-CM | POA: Diagnosis present

## 2013-08-07 DIAGNOSIS — I4729 Other ventricular tachycardia: Secondary | ICD-10-CM | POA: Diagnosis present

## 2013-08-07 LAB — CBC WITH DIFFERENTIAL/PLATELET
BASOS PCT: 1 % (ref 0–1)
Basophils Absolute: 0.1 10*3/uL (ref 0.0–0.1)
Eosinophils Absolute: 0.1 10*3/uL (ref 0.0–0.7)
Eosinophils Relative: 1 % (ref 0–5)
HEMATOCRIT: 29.6 % — AB (ref 39.0–52.0)
HEMOGLOBIN: 9.2 g/dL — AB (ref 13.0–17.0)
LYMPHS ABS: 0.8 10*3/uL (ref 0.7–4.0)
LYMPHS PCT: 11 % — AB (ref 12–46)
MCH: 25.9 pg — ABNORMAL LOW (ref 26.0–34.0)
MCHC: 31.1 g/dL (ref 30.0–36.0)
MCV: 83.4 fL (ref 78.0–100.0)
MONO ABS: 0.8 10*3/uL (ref 0.1–1.0)
MONOS PCT: 11 % (ref 3–12)
NEUTROS ABS: 5.4 10*3/uL (ref 1.7–7.7)
Neutrophils Relative %: 76 % (ref 43–77)
Platelets: 352 10*3/uL (ref 150–400)
RBC: 3.55 MIL/uL — AB (ref 4.22–5.81)
RDW: 19.5 % — ABNORMAL HIGH (ref 11.5–15.5)
WBC: 7.1 10*3/uL (ref 4.0–10.5)

## 2013-08-07 LAB — PROTIME-INR
INR: 1.92 — ABNORMAL HIGH (ref 0.00–1.49)
Prothrombin Time: 21.4 seconds — ABNORMAL HIGH (ref 11.6–15.2)

## 2013-08-07 LAB — BASIC METABOLIC PANEL
BUN: 13 mg/dL (ref 6–23)
CHLORIDE: 96 meq/L (ref 96–112)
CO2: 29 meq/L (ref 19–32)
CREATININE: 0.77 mg/dL (ref 0.50–1.35)
Calcium: 8.7 mg/dL (ref 8.4–10.5)
GFR calc Af Amer: >90 mL/min (ref >90)
GFR calc non Af Amer: 82 mL/min — ABNORMAL LOW (ref >90)
GLUCOSE: 93 mg/dL (ref 70–99)
POTASSIUM: 3.3 meq/L — AB (ref 3.7–5.3)
Sodium: 137 mEq/L (ref 137–147)

## 2013-08-07 LAB — PRO B NATRIURETIC PEPTIDE: Pro B Natriuretic peptide (BNP): 5086 pg/mL — ABNORMAL HIGH (ref 0–450)

## 2013-08-07 LAB — TROPONIN I

## 2013-08-07 LAB — MRSA PCR SCREENING: MRSA by PCR: NEGATIVE

## 2013-08-07 LAB — PROCALCITONIN: Procalcitonin: <0.1 ng/mL

## 2013-08-07 MED ORDER — DOXYCYCLINE HYCLATE 100 MG IV SOLR
100.0000 mg | Freq: Two times a day (BID) | INTRAVENOUS | Status: DC
  Administered 2013-08-07 – 2013-08-11 (×8): 100 mg via INTRAVENOUS
  Filled 2013-08-07 (×10): qty 100

## 2013-08-07 MED ORDER — SOTALOL HCL 120 MG PO TABS
240.0000 mg | ORAL_TABLET | Freq: Two times a day (BID) | ORAL | Status: DC
  Administered 2013-08-07 – 2013-08-11 (×8): 240 mg via ORAL
  Filled 2013-08-07 (×9): qty 2

## 2013-08-07 MED ORDER — SODIUM CHLORIDE 0.9 % IR SOLN
80.0000 mg | Status: AC
  Filled 2013-08-07: qty 2

## 2013-08-07 MED ORDER — WARFARIN - PHARMACIST DOSING INPATIENT: Freq: Every day | Status: DC

## 2013-08-07 MED ORDER — SODIUM CHLORIDE 0.9 % IV SOLN: 250.0000 mL | INTRAVENOUS | Status: DC | PRN

## 2013-08-07 MED ORDER — FERROUS SULFATE 325 (65 FE) MG PO TABS
325.0000 mg | ORAL_TABLET | Freq: Every day | ORAL | Status: DC
  Administered 2013-08-08 – 2013-08-11 (×3): 325 mg via ORAL
  Filled 2013-08-07 (×5): qty 1

## 2013-08-07 MED ORDER — LEVALBUTEROL HCL 0.63 MG/3ML IN NEBU
0.6300 mg | INHALATION_SOLUTION | Freq: Four times a day (QID) | RESPIRATORY_TRACT | Status: DC
  Administered 2013-08-07 – 2013-08-10 (×11): 0.63 mg via RESPIRATORY_TRACT
  Filled 2013-08-07 (×19): qty 3

## 2013-08-07 MED ORDER — ACETAMINOPHEN 325 MG PO TABS: 650.0000 mg | ORAL_TABLET | ORAL | Status: DC | PRN

## 2013-08-07 MED ORDER — CARVEDILOL 3.125 MG PO TABS
3.1250 mg | ORAL_TABLET | Freq: Two times a day (BID) | ORAL | Status: DC
  Filled 2013-08-07 (×2): qty 1

## 2013-08-07 MED ORDER — LEVALBUTEROL HCL 0.63 MG/3ML IN NEBU
0.6300 mg | INHALATION_SOLUTION | RESPIRATORY_TRACT | Status: DC | PRN
  Administered 2013-08-11 (×5): 0.63 mg via RESPIRATORY_TRACT
  Filled 2013-08-07: qty 3

## 2013-08-07 MED ORDER — SODIUM CHLORIDE 0.9 % IJ SOLN: 3.0000 mL | INTRAMUSCULAR | Status: DC | PRN

## 2013-08-07 MED ORDER — CEFAZOLIN SODIUM-DEXTROSE 2-3 GM-% IV SOLR
2.0000 g | INTRAVENOUS | Status: AC
  Administered 2013-08-07: 2 g via INTRAVENOUS
  Filled 2013-08-07: qty 50

## 2013-08-07 MED ORDER — ONDANSETRON HCL 4 MG/2ML IJ SOLN: 4.0000 mg | Freq: Four times a day (QID) | INTRAMUSCULAR | Status: DC | PRN

## 2013-08-07 MED ORDER — FORMOTEROL FUMARATE 20 MCG/2ML IN NEBU
20.0000 ug | INHALATION_SOLUTION | Freq: Two times a day (BID) | RESPIRATORY_TRACT | Status: DC
  Filled 2013-08-07 (×18): qty 2

## 2013-08-07 MED ORDER — CARVEDILOL 3.125 MG PO TABS: 3.1250 mg | ORAL_TABLET | Freq: Two times a day (BID) | ORAL | Status: DC

## 2013-08-07 MED ORDER — WARFARIN SODIUM 4 MG PO TABS
4.0000 mg | ORAL_TABLET | Freq: Once | ORAL | Status: AC
  Administered 2013-08-07: 4 mg via ORAL
  Filled 2013-08-07: qty 1

## 2013-08-07 MED ORDER — FUROSEMIDE 10 MG/ML IJ SOLN
40.0000 mg | Freq: Once | INTRAMUSCULAR | Status: AC
  Administered 2013-08-07: 40 mg via INTRAVENOUS
  Filled 2013-08-07: qty 4

## 2013-08-07 MED ORDER — POTASSIUM CHLORIDE CRYS ER 20 MEQ PO TBCR
40.0000 meq | EXTENDED_RELEASE_TABLET | Freq: Once | ORAL | Status: AC
  Administered 2013-08-07: 40 meq via ORAL
  Filled 2013-08-07: qty 2

## 2013-08-07 MED ORDER — FUROSEMIDE 10 MG/ML IJ SOLN
INTRAMUSCULAR | Status: AC
  Administered 2013-08-07: 14:00:00 40 mg via INTRAVENOUS
  Filled 2013-08-07: qty 4

## 2013-08-07 MED ORDER — CHLORHEXIDINE GLUCONATE 4 % EX LIQD
CUTANEOUS | Status: AC
  Filled 2013-08-07: qty 15

## 2013-08-07 MED ORDER — SOTALOL HCL 80 MG PO TABS
320.0000 mg | ORAL_TABLET | Freq: Every day | ORAL | Status: DC
  Administered 2013-08-07: 320 mg via ORAL
  Filled 2013-08-07: qty 1

## 2013-08-07 MED ORDER — POTASSIUM CHLORIDE ER 10 MEQ PO TBCR
10.0000 meq | EXTENDED_RELEASE_TABLET | Freq: Every day | ORAL | Status: DC
  Administered 2013-08-07: 10 meq via ORAL
  Filled 2013-08-07 (×2): qty 1

## 2013-08-07 MED ORDER — SIMVASTATIN 40 MG PO TABS
40.0000 mg | ORAL_TABLET | Freq: Every day | ORAL | Status: DC
Start: 2013-08-07 — End: 2013-08-11
  Administered 2013-08-07 – 2013-08-10 (×4): 40 mg via ORAL
  Filled 2013-08-07 (×6): qty 1

## 2013-08-07 MED ORDER — PANTOPRAZOLE SODIUM 40 MG PO TBEC
40.0000 mg | DELAYED_RELEASE_TABLET | Freq: Every day | ORAL | Status: DC
  Administered 2013-08-07 – 2013-08-11 (×5): 40 mg via ORAL
  Filled 2013-08-07 (×5): qty 1

## 2013-08-07 MED ORDER — MONTELUKAST SODIUM 10 MG PO TABS
10.0000 mg | ORAL_TABLET | Freq: Every day | ORAL | Status: DC
  Administered 2013-08-07 – 2013-08-10 (×4): 10 mg via ORAL
  Filled 2013-08-07 (×6): qty 1

## 2013-08-07 MED ORDER — CHLORHEXIDINE GLUCONATE 4 % EX LIQD
60.0000 mL | Freq: Once | CUTANEOUS | Status: AC
  Filled 2013-08-07: qty 30

## 2013-08-07 MED ORDER — SODIUM CHLORIDE 0.9 % IV SOLN
INTRAVENOUS | Status: DC
  Administered 2013-08-07: 14:00:00 via INTRAVENOUS
  Administered 2013-08-10: 1000 mL via INTRAVENOUS

## 2013-08-07 MED ORDER — FUROSEMIDE 40 MG PO TABS
40.0000 mg | ORAL_TABLET | Freq: Every day | ORAL | Status: DC
  Administered 2013-08-07: 40 mg via ORAL
  Filled 2013-08-07 (×2): qty 1

## 2013-08-07 MED ORDER — POTASSIUM CHLORIDE CRYS ER 20 MEQ PO TBCR
40.0000 meq | EXTENDED_RELEASE_TABLET | Freq: Every day | ORAL | Status: DC
  Administered 2013-08-07 – 2013-08-11 (×5): 40 meq via ORAL
  Filled 2013-08-07 (×6): qty 2

## 2013-08-07 MED ORDER — CARVEDILOL 6.25 MG PO TABS
6.2500 mg | ORAL_TABLET | Freq: Two times a day (BID) | ORAL | Status: DC
  Administered 2013-08-07 – 2013-08-11 (×8): 6.25 mg via ORAL
  Filled 2013-08-07 (×10): qty 1

## 2013-08-07 MED ORDER — SODIUM CHLORIDE 0.9 % IJ SOLN
3.0000 mL | Freq: Two times a day (BID) | INTRAMUSCULAR | Status: DC
  Administered 2013-08-07 – 2013-08-10 (×8): 3 mL via INTRAVENOUS

## 2013-08-07 MED ORDER — LOSARTAN POTASSIUM 25 MG PO TABS
12.5000 mg | ORAL_TABLET | Freq: Every day | ORAL | Status: DC
  Administered 2013-08-07 – 2013-08-08 (×2): 12.5 mg via ORAL
  Filled 2013-08-07 (×3): qty 0.5

## 2013-08-07 MED ORDER — ALBUTEROL SULFATE HFA 108 (90 BASE) MCG/ACT IN AERS
2.0000 | INHALATION_SPRAY | Freq: Four times a day (QID) | RESPIRATORY_TRACT | Status: DC | PRN
  Filled 2013-08-07: qty 6.7

## 2013-08-07 MED ORDER — FUROSEMIDE 10 MG/ML IJ SOLN
40.0000 mg | Freq: Once | INTRAMUSCULAR | Status: AC
  Administered 2013-08-07: 40 mg via INTRAVENOUS

## 2013-08-07 MED ORDER — BUDESONIDE 0.5 MG/2ML IN SUSP
0.5000 mg | Freq: Two times a day (BID) | RESPIRATORY_TRACT | Status: DC
  Administered 2013-08-07 – 2013-08-10 (×8): 0.5 mg via RESPIRATORY_TRACT
  Filled 2013-08-07 (×10): qty 2

## 2013-08-07 MED ORDER — PREDNISONE 20 MG PO TABS
40.0000 mg | ORAL_TABLET | Freq: Every day | ORAL | Status: DC
  Administered 2013-08-08 – 2013-08-11 (×4): 40 mg via ORAL
  Filled 2013-08-07 (×5): qty 2

## 2013-08-07 NOTE — Care Management Note (Addendum)
    Page 1 of 2   08/11/2013     4:06:35 PM   CARE MANAGEMENT NOTE 08/11/2013  Patient:  [name], [name]   Account Number:  [id number]  Date Initiated:  08/07/2013  Documentation initiated by:  Elissa Hefty  Subjective/Objective Assessment:   adm w syncope, multi icd shocks     Action/Plan:   lives w wife, pcp dr Shanon Brow tapper   Anticipated DC Date:     Anticipated DC Plan:  [place]  CM consult      [facility] Choice  Resumption Of Svcs/PTA Provider   Choice offered to / List presented to:          [facility] arranged  HH-1 RN      [place]   Status of service:  Completed, signed off Medicare Important Message given?   (If response is "NO", the following Medicare IM given date fields will be blank) Date Medicare IM given:   Date Additional Medicare IM given:    Discharge Disposition:  [place]  Per UR Regulation:  Reviewed for med. necessity/level of care/duration of stay  If discussed at [place] of Stay Meetings, dates discussed:    Comments:  08-11-13 1601 Jacqlyn Krauss, RN,BSN [phone] CM did receive referral to speak to family about [facility]. CM did make referral with [facility] and will fax information  and dme to agency. RN will not be able to meet pt tonight but will admit pt in the am. Medications faxed to hospice and they will send to [facility] in [place]. Family will receive dme at the house after 4:30 today from [place]. No frther needs from CM at this time.    1/2 1442 debbie dowell rn,bsn pt act w [facility] for [facility]n pta. alerted donna w [facility] of adm.

## 2013-08-07 NOTE — ED Notes (Signed)
Per EMS pt state he experienced x 2 since last evening where he felt vision go blurry and blacked out for a few minutes while lying in bed. Pt leaves with wife and son. Pt states he was in hospital 3 weeks ago. Pt has pacemaker/difib. Pt id as [place]

## 2013-08-07 NOTE — H&P (Addendum)
ELECTROPHYSIOLOGY ADMISSION HISTORY & PHYSICAL  Patient ID: [name] MRN: [id number], DOB/AGE: 1929/01/15   Date of Admission: 08/07/2013  Primary Physician: Deloria Lair, MD Primary Cardiologist: Bronson Ing, MD Primary EP: Rayann Heman, MD Reason for Admission: VT  History of Present Illness [name] is a 80 year old man with CAD, s/p CABG, ischemic cardiomyopathy, chronic systolic HF, VT s/p prior RFCA, paroxysmal AFib, COPD, DM, HTN and head and neck CA. He presents to [facility] ED with recurrent VT and multiple ICD shocks. He is accompanied by his wife who reports [name] had AMS episodes earlier this morning. He was unresponsive x 2, prompting her to call 911. [name] does not recall ICD shocks or having syncope. He denies CP. He reports increased SOB and a 3-lb weight gain over 3 days. He has been taking extra Lasix x 3 days with improvement. He feels his breathing is back to baseline today.    He has had 2 prior admissions within the last month. He was admitted 11/16-11/21 with recurrent symptomatic VT and ICD shocks. He did not previously tolerate amiodarone therapy and failed sotalol and mexiletine. He underwent repeat VT RFCA on 06/23/2013. He was continued on sotalol for atrial fibrillation. He remained on Coumadin. He was admitted again 11/28-12/7 with healthcare acquired pneumonia and associated sepsis. His course was complicated by adrenal insufficiency requiring stress dose steroids. He also required dopamine for blood pressure support. He was anemic and was transfused. He was followed by critical care and treated with antibiotics. He had an episode of atrial fibrillation with RVR. Sotalol was resumed. He otherwise maintained SR.   LHC (02/2012): oLM 80%, RCA occluded, LIMA-LAD patent, SVG-OM patent, SVG-PDA patent. Echo (12/2012): Mild LVH, EF 30-35%, inferolateral, inferior, apical, distal anteroseptal akinesis, mild AI, mod MR, mod LAE, mildly reduced RVSF, trivial TR.   Past  Medical History Past Medical History  Diagnosis Date  . Hypertension   . Coronary artery disease     a. CABG 03/2006. b. NSTEMI 03/2012 following VT likely demand ischemia - grafts patent at cath.  . Diabetes mellitus   . Ischemic cardiomyopathy     a. With chronic systolic CHF.  [name] Syncope and collapse   . Atrial fibrillation     a. Taking sotalol - intolerant to amiodarone. b. On Coumadin.  . Valvular heart disease     Mild to moderate mitral valve insufficiency and mild aortic insufficiency  . Chronic airway obstruction,steroid dependent   . Other and unspecified hyperlipidemia   . Chronic systolic heart failure     a. Ischemic cardiomyopathy ejection fraction 30-35% previously. b. Down to 15% by cath 03/2012.  [name] Cerebrovascular disease, unspecified   . Other mechanical complication of other internal orthopedic device, implant, and graft   . Carotid artery disease     s/p LCEA 2007.  [name] Head and neck cancer     S/P radiation therapy. "cancer free"  . Orthostatic hypotension   . Ventricular tachycardia     a. S/p ICD implantation. b. Recurrent in 03/2012 with medication adjustment. c. s/p RFCA 11/2012 (Dr. Greggory Brandy) ; d. Recent adm 06/2013 for VT/ICD shock after failing med rx with sotalol, mexiletine and metoprolol - repeat ablation 06-23-2013 by Dr Rayann Heman.  . Anemia     a. 06/2013 notes: "Given weight loss and iron deficiency, outpatient colonoscopy should be considered once more clinically stable."  . Pneumonia     a. Qualified for home O2 with ambulation during 06/2013 adm.  . Weight  loss     Past Surgical History Past Surgical History  Procedure Laterality Date  . Repeat otif left knee with figure-of-eight tension band.    . Implantation of a dual-chamber implantable  06/18/2007  . Left carotid endarterectomy    . Coronary artery bypass graft  04/04/2006  . Vt ablation  11/06/12; 06-23-2013    Ischemic VT ablation by Dr Rayann Heman; repeat 06-23-2013 by Dr Rayann Heman      Allergies/Intolerances Allergies  Allergen Reactions  . Amiodarone     Intolerant    Home Medications      albuterol 108 (90 BASE) MCG/ACT inhaler  Commonly known as:  PROVENTIL HFA;VENTOLIN HFA  Inhale 2 puffs into the lungs every 6 (six) hours as needed for wheezing or shortness of breath.     budesonide 0.5 MG/2ML nebulizer solution  Commonly known as:  PULMICORT  Take 0.5 mg by nebulization 2 (two) times daily.     carvedilol 3.125 MG tablet  Commonly known as:  COREG  Take 1 tablet (3.125 mg total) by mouth 2 (two) times daily with a meal.     ferrous sulfate 325 (65 FE) MG tablet  Take 325 mg by mouth daily with breakfast.     formoterol 20 MCG/2ML nebulizer solution  Commonly known as:  PERFOROMIST  Take 20 mcg by nebulization 2 (two) times daily.     furosemide 40 MG tablet  Commonly known as:  LASIX  Take 1 tablet (40 mg total) by mouth daily.     losartan 25 MG tablet  Commonly known as:  COZAAR  Take 0.5 tablets (12.5 mg total) by mouth daily.     montelukast 10 MG tablet  Commonly known as:  SINGULAIR  Take 10 mg by mouth at bedtime.     nitroGLYCERIN 0.4 MG SL tablet  Commonly known as:  NITROSTAT  Place 1 tablet (0.4 mg total) under the tongue every 5 (five) minutes as needed for chest pain.     omeprazole 40 MG capsule  Commonly known as:  PRILOSEC  Take 40 mg by mouth daily.     potassium chloride 10 MEQ tablet  Commonly known as:  K-DUR  Take 1 tablet (10 mEq total) by mouth daily. Take 2 tablets, total 20 mEq on Monday, Wednesday and Friday.     predniSONE 5 MG tablet  Commonly known as:  DELTASONE  Take 0.5 tablets (2.5 mg total) by mouth daily. Continue after prednisone taper.     simvastatin 40 MG tablet  Commonly known as:  ZOCOR  Take 40 mg by mouth at bedtime.     sotalol 160 MG tablet  Commonly known as:  BETAPACE  Take 320 mg by mouth daily.     warfarin 5 MG tablet  Commonly known as:  COUMADIN  Take 0.5 tablets (2.5 mg  total) by mouth at bedtime.     Family History Family History  Problem Relation Age of Onset  . CAD       Social History History   Social History  . Marital Status: Married    Spouse Name: N/A    Number of Children: N/A  . Years of Education: N/A   Occupational History  . RETIRED    Social History Main Topics  . Smoking status: Former Smoker -- 2.00 packs/day for 60 years    Types: Cigarettes    Quit date: 08/06/1990  . Smokeless tobacco: Never Used  . Alcohol Use: No  . Drug Use: No  .  Sexual Activity: Yes   Other Topics Concern  . Not on file   Social History Narrative   Pt does not get regular exercise.     Review of Systems General: No chills, fever, night sweats or weight changes.  Cardiovascular: No chest pain, dyspnea on exertion, edema, orthopnea, palpitations, paroxysmal nocturnal dyspnea. Dermatological: No rash, lesions or masses. Respiratory: No cough, dyspnea. Urologic: No hematuria, dysuria. Abdominal: No nausea, vomiting, diarrhea, bright red blood per rectum, melena, or hematemesis. Neurologic: No visual changes, weakness, changes in mental status. All other systems reviewed and are otherwise negative except as noted above.  Physical Exam Vitals: Blood pressure 109/59, pulse 85, temperature 97.5 F (36.4 C), temperature source Oral, resp. rate 26, SpO2 100.00%.  General: Well developed, chronically ill appearing 80 y.o. male in no acute distress. HEENT: Normocephalic, atraumatic. EOMs intact. Sclera nonicteric. Oropharynx clear.  Neck: Supple without bruits. No JVD. Lungs: Respirations regular and unlabored, CTA bilaterally. No wheezes, rales or rhonchi. Heart: RRR. S1, S2 present. No murmurs, rub, S3 or S4. Abdomen: Soft, non-tender, non-distended. BS present x 4 quadrants. No hepatosplenomegaly.  Extremities: No clubbing, cyanosis or edema. DP/PT/Radials 2+ and equal bilaterally. Psych: Normal affect. Neuro: Alert and oriented X 3. Moves all  extremities spontaneously. Musculoskeletal: No kyphosis. Skin: Intact. Warm and dry. No rashes or petechiae in exposed areas.   Labs ProBNP 5086 (down from 9510 one month ago) INR 1.92  Recent Labs  08-08-2013 0616  TROPONINI <0.30   Lab Results  Component Value Date   WBC 7.1 August 08, 2013   HGB 9.2* 08/08/13   HCT 29.6* 08/08/13   MCV 83.4 08-Aug-2013   PLT 352 08/08/13    Recent Labs Lab 08/08/13 0500  NA 137  K 3.3*  CL 96  CO2 29  BUN 13  CREATININE 0.77  CALCIUM 8.7  GLUCOSE 93   No components found with this basename: MAGNESIUM,    Radiology/Studies Dg Chest Portable 1 View 08-Aug-2013   CLINICAL DATA:  Shortness of breath, cough, congestion.  EXAM: PORTABLE CHEST - 1 VIEW  COMPARISON:  07/09/2013  FINDINGS: Stable appearance of postoperative changes and cardiac pacemaker. Cardiac enlargement with increasing pulmonary vascularity and developing interstitial changes suggesting vascular congestion edema. Small bilateral pleural effusions. No pneumothorax.  IMPRESSION: Cardiac enlargement with developing pulmonary vascular congestion and interstitial edema. Small bilateral pleural effusions peer   Electronically Signed   By: Lucienne Capers M.D.   On: 08/08/2013 05:45   Echocardiogram May 2014 Mild LVH, EF 30-35%, inferolateral, inferior, apical, distal anteroseptal akinesis, mild AI, mod MR, mod LAE, mildly reduced RVSF, trivial TR.   12-lead ECG on arrival shows SR at 79 bpm with frequent multifocal PVCs Device interrogation performed in ED - shows 21 VT/VF episodes since 07/05/2013; 16 episodes of slow VT were successfully terminated with ATP; since 07/26/2013, 5 fast VT episodes (CL 295 msec) required 25J shock x 1; V paced 16% of time; his ICD battery is now at [facility]  Assessment and Plan 1. Recurrent, symptomatic VT s/p appropriate ICD shocks - s/p VT RFCA x 2 and previously intolerant to amiodarone - failed adjunct therapy with mexiletine - continue sotalol and  carvedilol; will up-titrate carvedilol as BP allows 2. ICD battery now at [facility] - will need ICD gen change - narrow QRS, V pacing only 16% so does not meet criteria for CRT  3. Ischemic CM with chronic systolic HF - continue medical therapy 4. Paroxysmal AFib - in SR and will continue sotalol and  Coumadin 5. CAD - stable without anginal symptoms - continue medical therapy 6. Chronic anemia  - iron deficient - transfused during last admission 3 weeks ago - has been continued on Coumadin - outpatient colonoscopy previously recommended once clinically stable  Dr. Rayann Heman to see Signed, Ileene Hutchinson, PA-C 08/07/2013, 7:55 AM  I have seen, examined the patient, and reviewed the above assessment and plan.  Changes to above are made where necessary.  He returns with further VT.   ICD interrogation is reviewed and reveals that most of his VT is pace terminated.  He has had several ICD shocks for fast VT.  I will increase sotalol to 240mg  BID at this time and also try to increase his coreg to 6.25mg  BID.  His breathing is labored presently and he is SOB.  This is due to chronic lung disease/ COPD.  I will therefore resume his home therapy and consult pulmonary for their assistance. His ICD is ERI.  We will anticipate generator change this admission.  Risks, benefits, and alternatives to this procedure were discussed with the patient and his spouse who wish to proceed.  We will consider doing this later today but may have to wait until next week if his respiratory status does not improve.  Co Sign: Thompson Grayer, MD 08/07/2013 1:27 PM  Addendum: Much better with bronchodilators.  This was clearly a primary respiratory event but is not improving.  I appreciate pulmonary assistance.  We will keep in the hospital over the weekend to follow QT on sotalol (increased dose) and to allow pulmonary to fine tune his pulmonary status.  He has had adrenal insufficiency previously, so we need to monitor closely  and use stress dose steroids if needed. He was too ill today for generator change.  I will ask Dr Caryl Comes to do this on Monday if he is ready by then.  Possibly to telemetry tomorrow if respiratory status improves.  Will need PT over the weekend.  Possibly home after generator change on Monday.  Thompson Grayer MD 5:10 PM 08/07/2013

## 2013-08-07 NOTE — Progress Notes (Signed)
[place] Progress Note Patient Name: [name] DOB: 1928-11-15 MRN: [id number]  Date of Service  08/07/2013   HPI/Events of Note   SOB, lasix semi effective earlier.  Neb treatment given, sounds wet per bedside RN.  BP ok.  K of 3.3 and given 40 PO x1 earlier.  eICU Interventions  Lasix 40 x1, K-dur 40 x1 and BMET in AM.      [name] 08/07/2013, 9:09 PM

## 2013-08-07 NOTE — ED Provider Notes (Signed)
CSN: [id number]     Arrival date & time 08/07/13  0411 History   First MD Initiated Contact with Patient 08/07/13 [phone]     Chief Complaint  Patient presents with  . Pacemaker Problem    HPI Patient presents emergency department because of 2 syncopal episodes that occurred earlier this morning.  He states he started feeling like his vision is timeline and then he stated he would grab his wife's hand.  His wife would state that he became unresponsive and his eyes would roll back in his head.  This was transient and did not result in an AICD shock.  This would quickly resolve and the patient would return to normal mental status.  No postictal state.  No history of seizures.  No seizure activity noted.  Patient denies chest pain shortness of breath.  He has a history of pacemaker and AICD placement as well as a history of ventricular tachycardia.  He's been compliant with his medications.  No recent illness.  He states he did have his defibrillator fire on December 26 when he was sitting in a chair.  He did not informed his cardiology team of this.  Sitting here in the ER bed he has no complaints   Past Medical History  Diagnosis Date  . Hypertension   . Coronary artery disease     a. CABG 03/2006. b. NSTEMI 03/2012 following VT likely demand ischemia - grafts patent at cath.  . Diabetes mellitus   . Ischemic cardiomyopathy     a. With chronic systolic CHF.  [name] Syncope and collapse   . Atrial fibrillation     a. Taking sotalol - intolerant to amiodarone. b. On Coumadin.  . Valvular heart disease     Mild to moderate mitral valve insufficiency and mild aortic insufficiency  . Chronic airway obstruction,steroid dependent   . Other and unspecified hyperlipidemia   . Chronic systolic heart failure     a. Ischemic cardiomyopathy ejection fraction 30-35% previously. b. Down to 15% by cath 03/2012.  [name] Cerebrovascular disease, unspecified   . Other mechanical complication of other internal orthopedic  device, implant, and graft   . Carotid artery disease     s/p LCEA 2007.  [name] Head and neck cancer     S/P radiation therapy. "cancer free"  . Orthostatic hypotension   . Ventricular tachycardia     a. S/p ICD implantation. b. Recurrent in 03/2012 with medication adjustment. c. s/p RFCA 11/2012 (Dr. Greggory Brandy) ; d. Recent adm 06/2013 for VT/ICD shock after failing med rx with sotalol, mexiletine and metoprolol - repeat ablation 06-23-2013 by Dr Rayann Heman.  . Anemia     a. 06/2013 notes: "Given weight loss and iron deficiency, outpatient colonoscopy should be considered once more clinically stable."  . Pneumonia     a. Qualified for home O2 with ambulation during 06/2013 adm.  . Weight loss    Past Surgical History  Procedure Laterality Date  . Repeat otif left knee with figure-of-eight tension band.    . Implantation of a dual-chamber implantable  06/18/2007  . Left carotid endarterectomy    . Coronary artery bypass graft  04/04/2006  . Vt ablation  11/06/12; 06-23-2013    Ischemic VT ablation by Dr Rayann Heman; repeat 06-23-2013 by Dr Rayann Heman   Family History  Problem Relation Age of Onset  . CAD     History  Substance Use Topics  . Smoking status: Former Smoker -- 2.00 packs/day for 60 years  Types: Cigarettes    Quit date: 08/06/1990  . Smokeless tobacco: Never Used  . Alcohol Use: No    Review of Systems  All other systems reviewed and are negative.    Allergies  Amiodarone  Home Medications   Current Outpatient Rx  Name  Route  Sig  Dispense  Refill  . albuterol (PROVENTIL HFA;VENTOLIN HFA) 108 (90 BASE) MCG/ACT inhaler   Inhalation   Inhale 2 puffs into the lungs every 6 (six) hours as needed for wheezing or shortness of breath.         . budesonide (PULMICORT) 0.5 MG/2ML nebulizer solution   Nebulization   Take 0.5 mg by nebulization 2 (two) times daily.         . carvedilol (COREG) 3.125 MG tablet   Oral   Take 1 tablet (3.125 mg total) by mouth 2 (two) times daily  with a meal.   60 tablet   11   . ferrous sulfate 325 (65 FE) MG tablet   Oral   Take 325 mg by mouth daily with breakfast.         . formoterol (PERFOROMIST) 20 MCG/2ML nebulizer solution   Nebulization   Take 20 mcg by nebulization 2 (two) times daily.         . furosemide (LASIX) 40 MG tablet   Oral   Take 1 tablet (40 mg total) by mouth daily.           Decreased on 03/30/2013.   [name] losartan (COZAAR) 25 MG tablet   Oral   Take 0.5 tablets (12.5 mg total) by mouth daily.   30 tablet   0   . montelukast (SINGULAIR) 10 MG tablet   Oral   Take 10 mg by mouth at bedtime.          . nitroGLYCERIN (NITROSTAT) 0.4 MG SL tablet   Sublingual   Place 1 tablet (0.4 mg total) under the tongue every 5 (five) minutes as needed for chest pain.   25 each   3   . omeprazole (PRILOSEC) 40 MG capsule   Oral   Take 40 mg by mouth daily.         . potassium chloride (K-DUR) 10 MEQ tablet   Oral   Take 1 tablet (10 mEq total) by mouth daily. Take 2 tablets, total 20 mEq on Monday, Wednesday and Friday.   30 tablet   3   . predniSONE (DELTASONE) 5 MG tablet   Oral   Take 0.5 tablets (2.5 mg total) by mouth daily. Continue after prednisone taper.         . simvastatin (ZOCOR) 40 MG tablet   Oral   Take 40 mg by mouth at bedtime.          . sotalol (BETAPACE) 160 MG tablet   Oral   Take 320 mg by mouth daily.         [name] warfarin (COUMADIN) 5 MG tablet   Oral   Take 0.5 tablets (2.5 mg total) by mouth at bedtime.   40 tablet   11    BP 108/55  Pulse 80  Temp(Src) 97.5 F (36.4 C) (Oral)  Resp 18  SpO2 100% Physical Exam  Nursing note and vitals reviewed. Constitutional: He is oriented to person, place, and time. He appears well-developed and well-nourished.  HENT:  Head: Normocephalic and atraumatic.  Eyes: EOM are normal.  Neck: Normal range of motion.  Cardiovascular: Normal rate, regular rhythm, normal heart sounds  and intact distal pulses.    Pulmonary/Chest: Effort normal and breath sounds normal. No respiratory distress.  Abdominal: Soft. He exhibits no distension. There is no tenderness.  Musculoskeletal: Normal range of motion.  Neurological: He is alert and oriented to person, place, and time.  Skin: Skin is warm and dry.  Psychiatric: He has a normal mood and affect. Judgment normal.    ED Course  Procedures (including critical care time) Labs Review Labs Reviewed  CBC WITH DIFFERENTIAL - Abnormal; Notable for the following:    RBC 3.55 (*)    Hemoglobin 9.2 (*)    HCT 29.6 (*)    MCH 25.9 (*)    RDW 19.5 (*)    Lymphocytes Relative 11 (*)    All other components within normal limits  BASIC METABOLIC PANEL - Abnormal; Notable for the following:    Potassium 3.3 (*)    GFR calc non Af Amer 82 (*)    All other components within normal limits  PROTIME-INR - Abnormal; Notable for the following:    Prothrombin Time 21.4 (*)    INR 1.92 (*)    All other components within normal limits  PRO B NATRIURETIC PEPTIDE - Abnormal; Notable for the following:    Pro B Natriuretic peptide (BNP) 5086.0 (*)    All other components within normal limits  TROPONIN I   Imaging Review Dg Chest Portable 1 View  08/07/2013   CLINICAL DATA:  Shortness of breath, cough, congestion.  EXAM: PORTABLE CHEST - 1 VIEW  COMPARISON:  07/09/2013  FINDINGS: Stable appearance of postoperative changes and cardiac pacemaker. Cardiac enlargement with increasing pulmonary vascularity and developing interstitial changes suggesting vascular congestion edema. Small bilateral pleural effusions. No pneumothorax.  IMPRESSION: Cardiac enlargement with developing pulmonary vascular congestion and interstitial edema. Small bilateral pleural effusions peer   Electronically Signed   By: Lucienne Capers M.D.   On: 08/07/2013 05:45  I personally reviewed the imaging tests through PACS system I reviewed available ER/hospitalization records through the EMR   EKG  Interpretation    Date/Time:  Friday August 07 2013 04:25:31 EST Ventricular Rate:  79 PR Interval:    QRS Duration: 126 QT Interval:  440 QTC Calculation: 504 R Axis:   80 Text Interpretation:  Atrial fibrillation Paired ventricular premature complexes LVH with secondary repolarization abnormality Prolonged QT interval No significant change was found Confirmed by Eliasar Hlavaty  MD, Keane Martelli ([id number]) on 08/07/2013 6:23:14 AM            MDM  No diagnosis found. 7:23 AM [employer] tach at the bedside currently to interrogate pacemaker AICD.  Labs without significant abnormality.  Chest x-ray concerning for questionable developing interstitial edema.  Patient denies orthopnea.  BNP added  7:49 AM Pt has been receiving multiple AICD shocks for VT. EP consult will be obtained  Hoy Morn, MD 08/07/13 [phone]

## 2013-08-07 NOTE — ED Notes (Signed)
Admitting provider at bedside.

## 2013-08-07 NOTE — Consult Note (Signed)
PULMONARY / CRITICAL CARE MEDICINE  Name: [name] MRN: [id number] DOB: [birth year]    ADMISSION DATE:  08/07/2013 CONSULTATION DATE:  08/07/2013  REFERRING MD :  Cardiology PRIMARY SERVICE:  Cardiology  CHIEF COMPLAINT:  Dyspnea   BRIEF PATIENT DESCRIPTION:  80 yo with ICM EF 30-35%, COPD (oxygen and systemic steroids dependent f/b [place]) and chronic respiratory failure. Admitted on 1/2 with 3 days history of progressive SOB,  recurrent episodes of symptomatic VT resulting in his ICD firing. He was admitted for titration of his antiarrythmic regimen and generator change of his ICD. Developed marked worsening of dyspnea over the first 8 hours of admit. PCCM asked to assess in regards to his COPD as a contributing factor to his dyspnea.   SIGNIFICANT EVENTS / STUDIES:   LINES / TUBES:  CULTURES: 1/2  MRSA PCR >>> Neg  ANTIBIOTICS:  HISTORY OF PRESENT ILLNESS:   80 year old man with CAD, s/p CABG, ischemic cardiomyopathy, chronic systolic HF, VT s/p prior RFCA, paroxysmal AFib, COPD (on home O2/chronic steroids f/b [place]), DM, HTN and head and neck CA. He presents to [facility] ED with recurrent VT and multiple ICD shocks on 1/2. Per his wife: [name] had 2 unresponsive episodes prompting her to call 911. [name] did not recall ICD shocks or having syncope. He denied CP. He reported increased SOB and a 3-lb weight gain over 3 days. He has been taking extra Lasix x 3 days with improvement. He initially felt his breathing is back to baseline on admit. He had an episode of atrial fibrillation with RVR. Sotalol was resumed. He otherwise maintained SR. He was admitted to EP service with plans to adjust sotalol, and carvedilol, as well as change ICD generator as multiple shocks had decreased battery life. About 8 hours after admission he developed rapid progression of his dyspnea w/ CXR which suggested pulmonary edema. PCCM has been asked to assess to see in regards to his underlying COPD.    PAST MEDICAL HISTORY :  Past Medical History  Diagnosis Date  . Hypertension   . Coronary artery disease     a. CABG 03/2006. b. NSTEMI 03/2012 following VT likely demand ischemia - grafts patent at cath.  . Diabetes mellitus   . Ischemic cardiomyopathy     a. With chronic systolic CHF.  [name] Syncope and collapse   . Atrial fibrillation     a. Taking sotalol - intolerant to amiodarone. b. On Coumadin.  . Valvular heart disease     Mild to moderate mitral valve insufficiency and mild aortic insufficiency  . Chronic airway obstruction,steroid dependent   . Other and unspecified hyperlipidemia   . Chronic systolic heart failure     a. Ischemic cardiomyopathy ejection fraction 30-35% previously. b. Down to 15% by cath 03/2012.  [name] Cerebrovascular disease, unspecified   . Other mechanical complication of other internal orthopedic device, implant, and graft   . Carotid artery disease     s/p LCEA 2007.  [name] Head and neck cancer     S/P radiation therapy. "cancer free"  . Orthostatic hypotension   . Ventricular tachycardia     a. S/p ICD implantation. b. Recurrent in 03/2012 with medication adjustment. c. s/p RFCA 11/2012 (Dr. Greggory Brandy) ; d. Recent adm 06/2013 for VT/ICD shock after failing med rx with sotalol, mexiletine and metoprolol - repeat ablation 06-23-2013 by Dr Rayann Heman.  . Anemia     a. 06/2013 notes: "Given weight loss and iron deficiency, outpatient colonoscopy should  be considered once more clinically stable."  . Pneumonia     a. Qualified for home O2 with ambulation during 06/2013 adm.  . Weight loss    Past Surgical History  Procedure Laterality Date  . Repeat otif left knee with figure-of-eight tension band.    . Implantation of a dual-chamber implantable  06/18/2007  . Left carotid endarterectomy    . Coronary artery bypass graft  04/04/2006  . Vt ablation  11/06/12; 06-23-2013    Ischemic VT ablation by Dr Rayann Heman; repeat 06-23-2013 by Dr Rayann Heman   Prior to Admission medications    Medication Sig Start Date End Date Taking? Authorizing Provider  albuterol (PROVENTIL HFA;VENTOLIN HFA) 108 (90 BASE) MCG/ACT inhaler Inhale 2 puffs into the lungs every 6 (six) hours as needed for wheezing or shortness of breath.   Yes Historical Provider, MD  budesonide (PULMICORT) 0.5 MG/2ML nebulizer solution Take 0.5 mg by nebulization 2 (two) times daily.   Yes Historical Provider, MD  carvedilol (COREG) 3.125 MG tablet Take 1 tablet (3.125 mg total) by mouth 2 (two) times daily with a meal. 07/17/13  Yes Liliane Shi, PA-C  ferrous sulfate 325 (65 FE) MG tablet Take 325 mg by mouth daily with breakfast.   Yes Historical Provider, MD  formoterol (PERFOROMIST) 20 MCG/2ML nebulizer solution Take 20 mcg by nebulization 2 (two) times daily.   Yes Historical Provider, MD  furosemide (LASIX) 40 MG tablet Take 1 tablet (40 mg total) by mouth daily. 03/30/13  Yes Thompson Grayer, MD  losartan (COZAAR) 25 MG tablet Take 0.5 tablets (12.5 mg total) by mouth daily. 01/28/13  Yes Reyne Dumas, MD  montelukast (SINGULAIR) 10 MG tablet Take 10 mg by mouth at bedtime.  05/22/13  Yes Historical Provider, MD  nitroGLYCERIN (NITROSTAT) 0.4 MG SL tablet Place 1 tablet (0.4 mg total) under the tongue every 5 (five) minutes as needed for chest pain. 11/26/11  Yes Ezra Sites, MD  omeprazole (PRILOSEC) 40 MG capsule Take 40 mg by mouth daily.   Yes Historical Provider, MD  potassium chloride (K-DUR) 10 MEQ tablet Take 1 tablet (10 mEq total) by mouth daily. Take 2 tablets, total 20 mEq on Monday, Wednesday and Friday. 07/12/13  Yes Roger A Arguello, PA-C  predniSONE (DELTASONE) 5 MG tablet Take 0.5 tablets (2.5 mg total) by mouth daily. Continue after prednisone taper. 07/12/13  Yes Roger A Arguello, PA-C  simvastatin (ZOCOR) 40 MG tablet Take 40 mg by mouth at bedtime.    Yes Historical Provider, MD  sotalol (BETAPACE) 160 MG tablet Take 320 mg by mouth daily.   Yes Historical Provider, MD  warfarin (COUMADIN) 5 MG  tablet Take 0.5 tablets (2.5 mg total) by mouth at bedtime. 06/26/13  Yes Brooke O Edmisten, PA-C   Allergies  Allergen Reactions  . Amiodarone     Intolerant    FAMILY HISTORY:  Family History  Problem Relation Age of Onset  . CAD     SOCIAL HISTORY:  reports that he quit smoking about 23 years ago. His smoking use included Cigarettes. He has a 120 pack-year smoking history. He has never used smokeless tobacco. He reports that he does not drink alcohol or use illicit drugs.  REVIEW OF SYSTEMS ( bolds are positive ) Constitutional: weight loss, gain, 3 lbs in 3 d, night sweats, Fevers, chills, fatigue .  HEENT: headaches, Sore throat, sneezing, nasal congestion, post nasal drip, Difficulty swallowing, Tooth/dental problems, visual complaints visual changes, ear ache CV:  chest  tightness, radiates: ,Orthopnea, PND, swelling in lower extremities, dizziness, palpitations, syncope.  GI  heartburn, indigestion, abdominal pain, nausea, vomiting, diarrhea, change in bowel habits, loss of appetite, bloody stools.  Resp: cough, productive: , hemoptysis, dyspnea, chest pain, pleuritic. Marked dyspnea  Skin: rash or itching or icterus GU: dysuria, change in color of urine, urgency or frequency. flank pain, hematuria  MS: joint pain or swelling. decreased range of motion  Psych: change in mood or affect. depression or anxiety.  Neuro: difficulty with speech, weakness, numbness, ataxia   SUBJECTIVE:   VITAL SIGNS: Temp:  [97.5 F (36.4 C)] 97.5 F (36.4 C) (01/02 1146) Pulse Rate:  [79-90] 82 (01/02 1146) Resp:  [17-26] 22 (01/02 1000) BP: (98-117)/(48-59) 117/56 mmHg (01/02 1146) SpO2:  [97 %-100 %] 99 % (01/02 1158) Weight:  [63.5 kg (139 lb 15.9 oz)] 63.5 kg (139 lb 15.9 oz) (01/02 1000)  PHYSICAL EXAMINATION: General:  Chronically ill appearing white male. resp efforts labored. Currently w/ nausea and vomiting  Neuro:  No focal def  HEENT:  [place], no JVD  Cardiovascular:  Rrr, no  MRG Lungs:  Crackles in bases, faint exp wheeze  Abdomen:  Soft, non-tender + bowel sounds  Musculoskeletal:  Intact  Skin:  LE edema    Recent Labs Lab 08/07/13 0500  NA 137  K 3.3*  CL 96  CO2 29  BUN 13  CREATININE 0.77  GLUCOSE 93    Recent Labs Lab 08/07/13 0500  HGB 9.2*  HCT 29.6*  WBC 7.1  PLT 352   CXR: 1/2 >>> Pulmonary vascular congestion, interstitial edema and bilateral effusions.  ASSESSMENT / PLAN:  Acute on chronic respiratory failure Acute on chronic systolic congestive heart failure Acute pulmonary edema COPD ( on chronic oxygen and systemic steroids ), possible exacerbation Recurrent VT with multiple ICD shocks PAF  --> Agree with Lasix --> Will change his Performist to Xopenex around the clock for now --> Check PCT --> Continue oxygen, goal SpO2>92 --> No indications for IV steroids, increase Prednisone to 40 daily --> Empirical Doxycycline  --> BiPAP contraindicated given nausea and vomiting --> Careful titration of non-specific b-blockers in patient with obstructive lung disease  Doree Fudge, MD Pulmonary and [place] Pager: [phone]  08/07/2013, 1:58 PM

## 2013-08-07 NOTE — Progress Notes (Signed)
[facility]  Patient Status: Active (receiving services up to time of hospitalization)  [facility] is providing the following services: RN  If patient discharges after hours, please call [phone].   [name] 08/07/2013, 2:43 PM

## 2013-08-07 NOTE — Progress Notes (Signed)
ANTICOAGULATION CONSULT NOTE - Initial Consult  Pharmacy Consult for Coumadin Indication: atrial fibrillation  Allergies  Allergen Reactions  . Amiodarone     Intolerant    Patient Measurements:   Vital Signs: Temp: 97.5 F (36.4 C) (01/02 0416) Temp src: Oral (01/02 0416) BP: 100/54 mmHg (01/02 0830) Pulse Rate: 84 (01/02 0830)  Labs:  Recent Labs  08/07/13 0500 08/07/13 0616  HGB 9.2*  --   HCT 29.6*  --   PLT 352  --   LABPROT 21.4*  --   INR 1.92*  --   CREATININE 0.77  --   TROPONINI  --  <0.30    The CrCl is unknown because both a height and weight (above a minimum accepted value) are required for this calculation.   Medical History: Past Medical History  Diagnosis Date  . Hypertension   . Coronary artery disease     a. CABG 03/2006. b. NSTEMI 03/2012 following VT likely demand ischemia - grafts patent at cath.  . Diabetes mellitus   . Ischemic cardiomyopathy     a. With chronic systolic CHF.  [name] Syncope and collapse   . Atrial fibrillation     a. Taking sotalol - intolerant to amiodarone. b. On Coumadin.  . Valvular heart disease     Mild to moderate mitral valve insufficiency and mild aortic insufficiency  . Chronic airway obstruction,steroid dependent   . Other and unspecified hyperlipidemia   . Chronic systolic heart failure     a. Ischemic cardiomyopathy ejection fraction 30-35% previously. b. Down to 15% by cath 03/2012.  [name] Cerebrovascular disease, unspecified   . Other mechanical complication of other internal orthopedic device, implant, and graft   . Carotid artery disease     s/p LCEA 2007.  [name] Head and neck cancer     S/P radiation therapy. "cancer free"  . Orthostatic hypotension   . Ventricular tachycardia     a. S/p ICD implantation. b. Recurrent in 03/2012 with medication adjustment. c. s/p RFCA 11/2012 (Dr. Greggory Brandy) ; d. Recent adm 06/2013 for VT/ICD shock after failing med rx with sotalol, mexiletine and metoprolol - repeat ablation  06-23-2013 by Dr Rayann Heman.  . Anemia     a. 06/2013 notes: "Given weight loss and iron deficiency, outpatient colonoscopy should be considered once more clinically stable."  . Pneumonia     a. Qualified for home O2 with ambulation during 06/2013 adm.  . Weight loss     Medications:  Coumadin 2.5mg  po hs  Assessment: recurrent VT and multiple ICD shocks  80 y/o M c/o blurry vision, blacking out, HR dipped down into the 20's. Pt has pacer and AICD. Has had 2 admissions in the last month (recurrent VT/ICD shocks, then HAP and sepsis).   PMH:  significant for CAD, s/p CABG, ischemic cardiomyopathy, chronic systolic HF, VT s/p prior RFCA, paroxysmal AFib, COPD, DM, HTN and head and neck CA.  Pertinent Labs: K=3.3. Scr 0.77. Hgb 9.2, INR 1.92, proBNP 5086  Goal of Therapy:  INR 2-3 Monitor platelets by anticoagulation protocol: Yes   Plan:  Coumadin 4mg  po x 1 tonight. May need a slight increase in home maintenance Coumadin dosing. Daily INR.   Soriyah Osberg S. Alford Highland, PharmD, BCPS Clinical Staff Pharmacist Pager [phone]  Eilene Ghazi Stillinger 08/07/2013,9:41 AM

## 2013-08-07 NOTE — Progress Notes (Signed)
Nutrition Brief Note  Patient identified on the Malnutrition Screening Tool (MST) Report  Wt Readings from Last 15 Encounters:  08/07/13 139 lb 15.9 oz (63.5 kg)  08/07/13 139 lb 15.9 oz (63.5 kg)  07/17/13 139 lb (63.05 kg)  07/12/13 136 lb 14.5 oz (62.1 kg)  06/26/13 139 lb (63.05 kg)  06/26/13 139 lb (63.05 kg)  06/14/13 143 lb 4.8 oz (65 kg)  04/03/13 144 lb (65.318 kg)  03/30/13 148 lb (67.132 kg)  01/28/13 144 lb (65.318 kg)  12/25/12 149 lb 12.8 oz (67.949 kg)  12/24/12 151 lb 12.8 oz (68.856 kg)  11/08/12 149 lb 11.1 oz (67.9 kg)  11/08/12 149 lb 11.1 oz (67.9 kg)  10/22/12 154 lb (69.854 kg)    Body mass index is 20.66 kg/(m^2). Patient meets criteria for normal weight based on current BMI.   Current diet order is NPO for procedure, but previous diet was Heart Healthy, patient was consuming approximately 100% of meals at this time. Labs and medications reviewed.   Patient with history of weight loss, but reports that his usual intake is very good. His weight has been stable for the last 6 months. He takes Megace at home.   No nutrition interventions warranted at this time. If nutrition issues arise, please consult RD.   Larey Seat, RD, LDN Pager #: [phone] After-Hours Pager #: [phone]

## 2013-08-07 NOTE — ED Notes (Signed)
Pt's HR dipping into 20's; Md notified

## 2013-08-07 NOTE — Progress Notes (Signed)
Qtc prior to administration of increased dose of sotalol is 0.53. Ok to give medication per md [employer]

## 2013-08-07 NOTE — Progress Notes (Addendum)
Called to see patient regarding worsening SOB. This is worse just since admission. He reports feeling these symptoms intermittently. Over the last 3 days he reports increasing SOB and 3-lb weight gain for which he took additional doses of Lasix x 3 days. While in the ED, he reported to me that he felt his breathing had returned to baseline. He was comfortable with stable vitals, diminished breath sounds bilaterally. No wheezes or rhonchi. Chest x-ray concerning for developing pulmonary edema. Now he has noticed acute worsening SOB and chest "tightness" which he states is a definite change since admission. He did not take his morning medications including inhalers / nebulizer. He has received his home medications now but with persistent SOB. Current vitals - BP 117/56 pulse 82 resp 28 O2 sat on [place] 98% temp 97.5. On exam, he appears pale and tachypneic. Heart - regular S1, S2. No murmur. Lungs - diminished breath sounds throughout. Ordered IV Lasix 40 mg x 1 now. Repeat 12-lead ECG now. Pulmonary consultation requested. Will follow closely.

## 2013-08-08 DIAGNOSIS — J9600 Acute respiratory failure, unspecified whether with hypoxia or hypercapnia: Secondary | ICD-10-CM

## 2013-08-08 DIAGNOSIS — I472 Ventricular tachycardia, unspecified: Secondary | ICD-10-CM | POA: Diagnosis present

## 2013-08-08 DIAGNOSIS — I059 Rheumatic mitral valve disease, unspecified: Secondary | ICD-10-CM

## 2013-08-08 LAB — BASIC METABOLIC PANEL
BUN: 13 mg/dL (ref 6–23)
CHLORIDE: 94 meq/L — AB (ref 96–112)
CO2: 29 mEq/L (ref 19–32)
Calcium: 8.8 mg/dL (ref 8.4–10.5)
Creatinine, Ser: 0.73 mg/dL (ref 0.50–1.35)
GFR calc non Af Amer: 84 mL/min — ABNORMAL LOW (ref >90)
Glucose, Bld: 162 mg/dL — ABNORMAL HIGH (ref 70–99)
Potassium: 4.1 mEq/L (ref 3.7–5.3)
Sodium: 135 mEq/L — ABNORMAL LOW (ref 137–147)

## 2013-08-08 LAB — PROTIME-INR
INR: 2.05 — ABNORMAL HIGH (ref 0.00–1.49)
PROTHROMBIN TIME: 22.5 s — AB (ref 11.6–15.2)

## 2013-08-08 LAB — PROCALCITONIN: Procalcitonin: 0.16 ng/mL

## 2013-08-08 MED ORDER — WARFARIN SODIUM 3 MG PO TABS
3.0000 mg | ORAL_TABLET | Freq: Once | ORAL | Status: AC
Start: 2013-08-08 — End: 2013-08-08
  Administered 2013-08-08: 3 mg via ORAL
  Filled 2013-08-08: qty 1

## 2013-08-08 MED ORDER — MEGESTROL ACETATE 40 MG/ML PO SUSP
40.0000 mg | Freq: Every day | ORAL | Status: DC
  Administered 2013-08-09 – 2013-08-11 (×3): 40 mg via ORAL
  Filled 2013-08-08 (×3): qty 5

## 2013-08-08 MED ORDER — FUROSEMIDE 10 MG/ML IJ SOLN
40.0000 mg | Freq: Two times a day (BID) | INTRAMUSCULAR | Status: DC
  Administered 2013-08-08 – 2013-08-09 (×3): 40 mg via INTRAVENOUS
  Filled 2013-08-08 (×5): qty 4

## 2013-08-08 NOTE — Progress Notes (Signed)
PULMONARY / CRITICAL CARE MEDICINE  Name: [name] MRN: [id number] DOB: 1929-07-21    ADMISSION DATE:  08/07/2013 CONSULTATION DATE:  08/07/2013  REFERRING MD :  Cardiology PRIMARY SERVICE:  Cardiology  CHIEF COMPLAINT:  Dyspnea   BRIEF PATIENT DESCRIPTION:  80 yo with ICM EF 30-35%, COPD (oxygen and systemic steroids dependent f/b Henderson) and chronic respiratory failure. Admitted on 1/2 with 3 days history of progressive SOB,  recurrent episodes of symptomatic VT resulting in his ICD firing. He was admitted for titration of his antiarrythmic regimen and generator change of his ICD. Developed marked worsening of dyspnea over the first 8 hours of admit. PCCM asked to assess in regards to his COPD as a contributing factor to his dyspnea.   SIGNIFICANT EVENTS / STUDIES:  1/2 - Admit with progressive SOB, recurrent VT  LINES / TUBES:  CULTURES: 1/2  MRSA PCR >>> Neg  ANTIBIOTICS: Cefazolin 1/2 x1 Doxycycline 1/2>>>  SUBJECTIVE: "I feel better than yesterday".  Denies chest pain, syncope.  Reports mild SOB  VITAL SIGNS: Temp:  [97.3 F (36.3 C)-98.9 F (37.2 C)] 97.6 F (36.4 C) (01/03 0740) Pulse Rate:  [55-117] 91 (01/03 0740) Resp:  [19-29] 25 (01/03 0740) BP: (90-143)/(42-97) 97/51 mmHg (01/03 0740) SpO2:  [96 %-100 %] 99 % (01/03 0740) Weight:  [139 lb 15.9 oz (63.5 kg)-140 lb 3.4 oz (63.6 kg)] 140 lb 3.4 oz (63.6 kg) (01/03 0600)  PHYSICAL EXAMINATION: General:  Chronically ill appearing white male.  Neuro:  No focal def  HEENT:  [place], no JVD  Cardiovascular:  Rrr, no MRG Lungs:  Even/non-labored, faint exp wheeze  Abdomen:  Soft, non-tender + bowel sounds  Musculoskeletal:  Intact  Skin: abd edema, non in LE's   Recent Labs Lab 08/07/13 0500 08/08/13 0550  NA 137 135*  K 3.3* 4.1  CL 96 94*  CO2 29 29  BUN 13 13  CREATININE 0.77 0.73  GLUCOSE 93 162*    Recent Labs Lab 08/07/13 0500  HGB 9.2*  HCT 29.6*  WBC 7.1  PLT 352   CXR: 1/2  >>>Pulmonary vascular congestion, interstitial edema and bilateral effusions.  ASSESSMENT / PLAN:  Acute on chronic respiratory failure Acute on chronic systolic congestive heart failure Acute pulmonary edema COPD (on chronic oxygen and systemic steroids), possible exacerbation Recurrent VT with multiple ICD shocks PAF  Plan: - Lasix as renal fxn / bp permit - Hold Performist, Xopenex scheduled for now - PCT reassuring - Continue oxygen, goal SpO2>92 - No indications for IV steroids, increase Prednisone to 40 daily - Empiric Doxycycline  - BiPAP contraindicated given nausea and vomiting - Careful titration of non-specific b-blockers in patient with obstructive lung disease - CXR PRN - Pulmonary hygiene  Noe Gens, NP-C  Pulmonary & Critical Care Pgr: [phone] or [phone]   08/08/2013, 9:49 AM

## 2013-08-08 NOTE — Progress Notes (Signed)
Attending:  I have seen and examined the patient with nurse practitioner/resident and agree with the note above.   Change to prednisone Continue with plans for procedure on Monday Discussed spiriva, he is hesitant due to cost Recommended that he follow up with me or a pulmonary physician he has seen in the past  Will see and sign off tomorrow if continues to do well.  Jillyn Hidden PCCM Pager: [phone] Cell: [phone] If no response, call [phone]

## 2013-08-08 NOTE — Progress Notes (Signed)
Utilization Review completed.  

## 2013-08-08 NOTE — Progress Notes (Signed)
Echocardiogram 2D Echocardiogram has been performed.  Joelene Millin 08/08/2013, 9:22 AM

## 2013-08-08 NOTE — Progress Notes (Signed)
Patient ID: RENEE BEALE, male   DOB: 25-Dec-1928, 80 y.o.   MRN: [id number]   SUBJECTIVE: Less short of breath today.  No chest pain.  No more VT.   . budesonide  0.5 mg Nebulization BID  . carvedilol  6.25 mg Oral BID WC  . doxycycline (VIBRAMYCIN) IV  100 mg Intravenous Q12H  . ferrous sulfate  325 mg Oral Q breakfast  . furosemide  40 mg Intravenous BID  . gentamicin irrigation  80 mg Irrigation On Call  . levalbuterol  0.63 mg Nebulization Q6H  . losartan  12.5 mg Oral Daily  . montelukast  10 mg Oral QHS  . pantoprazole  40 mg Oral Daily  . potassium chloride  40 mEq Oral Daily  . predniSONE  40 mg Oral Q breakfast  . simvastatin  40 mg Oral QHS  . sodium chloride  3 mL Intravenous Q12H  . sotalol  240 mg Oral Q12H  . warfarin  3 mg Oral ONCE-1800  . Warfarin - Pharmacist Dosing Inpatient   Does not apply q1800      Filed Vitals:   08/08/13 0347 08/08/13 0400 08/08/13 0600 08/08/13 0740  BP:  90/42  97/51  Pulse:  82  91  Temp: 98.4 F (36.9 C)   97.6 F (36.4 C)  TempSrc: Oral   Oral  Resp:  22  25  Height:      Weight:   63.6 kg (140 lb 3.4 oz)   SpO2:  96%  99%    Intake/Output Summary (Last 24 hours) at 08/08/13 0944 Last data filed at 08/08/13 0800  Gross per 24 hour  Intake 518.33 ml  Output   1750 ml  Net -1231.67 ml    LABS: Basic Metabolic Panel:  Recent Labs  08/07/13 0500 08/08/13 0550  NA 137 135*  K 3.3* 4.1  CL 96 94*  CO2 29 29  GLUCOSE 93 162*  BUN 13 13  CREATININE 0.77 0.73  CALCIUM 8.7 8.8   Liver Function Tests: No results found for this basename: AST, ALT, ALKPHOS, BILITOT, PROT, ALBUMIN,  in the last 72 hours No results found for this basename: LIPASE, AMYLASE,  in the last 72 hours CBC:  Recent Labs  08/07/13 0500  WBC 7.1  NEUTROABS 5.4  HGB 9.2*  HCT 29.6*  MCV 83.4  PLT 352   Cardiac Enzymes:  Recent Labs  08/07/13 0616  TROPONINI <0.30   BNP: No components found with this basename: POCBNP,    D-Dimer: No results found for this basename: DDIMER,  in the last 72 hours Hemoglobin A1C: No results found for this basename: HGBA1C,  in the last 72 hours Fasting Lipid Panel: No results found for this basename: CHOL, HDL, LDLCALC, TRIG, CHOLHDL, LDLDIRECT,  in the last 72 hours Thyroid Function Tests: No results found for this basename: TSH, T4TOTAL, FREET3, T3FREE, THYROIDAB,  in the last 72 hours Anemia Panel: No results found for this basename: VITAMINB12, FOLATE, FERRITIN, TIBC, IRON, RETICCTPCT,  in the last 72 hours  RADIOLOGY: Dg Chest Portable 1 View  08/07/2013   CLINICAL DATA:  Shortness of breath, cough, congestion.  EXAM: PORTABLE CHEST - 1 VIEW  COMPARISON:  07/09/2013  FINDINGS: Stable appearance of postoperative changes and cardiac pacemaker. Cardiac enlargement with increasing pulmonary vascularity and developing interstitial changes suggesting vascular congestion edema. Small bilateral pleural effusions. No pneumothorax.  IMPRESSION: Cardiac enlargement with developing pulmonary vascular congestion and interstitial edema. Small bilateral pleural effusions peer  Electronically Signed   By: Lucienne Capers M.D.   On: 08/07/2013 05:45    PHYSICAL EXAM General: NAD Neck: JVP 10-12 cm, no thyromegaly or thyroid nodule.  Lungs: Distant BS bilaterally CV: Nondisplaced PMI.  Heart regular S1/S2, no S3/S4, no murmur.  No peripheral edema.  Abdomen: Soft, nontender, no hepatosplenomegaly, no distention.  Neurologic: Alert and oriented x 3.  Psych: Normal affect. Extremities: No clubbing or cyanosis.   TELEMETRY: Reviewed telemetry pt in NSR with low voltage P waves  ASSESSMENT AND PLAN: 80 yo with history of CAD s/p CABG, ischemic CMP (EF 30-35% 7/13), VT s/p ablation with recurrence, COPD, PAF presented after receiving multiple ICD shocks for VT.   1. VT: ICD shocks at home.  He has had RFCA in past.  He did not tolerate amiodarone.  He did not tolerate adjunctive  mexiletine treatment.  Seen by Dr. Rayann Heman yesterday, Coreg and sotalol increased.  QTc this morning 504 msec on increased sotalol.  Will tolerate this for now.   - Needs ICD generator change Monday (ok for INR 2-3).  - Continue current sotalol, will need to follow QT interval with ECGs (order for tomorrow morning).  - No BP room to increase Coreg further.  2. CAD: Doubt ACS, troponin normal.  I think that the VT was scar-related, possibly triggered by volume overload/CHF.  3. Acute on chronic systolic CHF: EF 38-75% in the past, echo done today.  He is volume overloaded on exam still. I think that this is probably the primary cause of his acute dyspnea. CXR with pulmonary edema.  - Lasix 40 mg IV bid to start today. Watch K with diuresis.  - Continue Coreg and losartan at current doses, no BP room to titrate.  4. COPD: No PNA on CXR, PCT not elevated. Treating for COPD exacerbation.  Still think that the acute dyspnea is mostly fluid.  5. PAF: Patient appears to be in NSR this morning with low voltage Ps.  He is on warfarin.   Loralie Champagne 08/08/2013 9:53 AM

## 2013-08-08 NOTE — Progress Notes (Signed)
ANTICOAGULATION CONSULT NOTE - Initial Consult  Pharmacy Consult for Coumadin Indication: atrial fibrillation  Allergies  Allergen Reactions  . Amiodarone     Intolerant    Patient Measurements:   Vital Signs: Temp: 98.4 F (36.9 C) (01/03 0347) Temp src: Oral (01/03 0347) BP: 90/42 mmHg (01/03 0400) Pulse Rate: 82 (01/03 0400)  Labs:  Recent Labs  08/07/13 0500 08/07/13 0616 08/08/13 0550  HGB 9.2*  --   --   HCT 29.6*  --   --   PLT 352  --   --   LABPROT 21.4*  --  22.5*  INR 1.92*  --  2.05*  CREATININE 0.77  --  0.73  TROPONINI  --  <0.30  --     Estimated Creatinine Clearance: 64 ml/min (by C-G formula based on Cr of 0.73).   Medical History: Past Medical History  Diagnosis Date  . Hypertension   . Coronary artery disease     a. CABG 03/2006. b. NSTEMI 03/2012 following VT likely demand ischemia - grafts patent at cath.  . Diabetes mellitus   . Ischemic cardiomyopathy     a. With chronic systolic CHF.  [name] Syncope and collapse   . Atrial fibrillation     a. Taking sotalol - intolerant to amiodarone. b. On Coumadin.  . Valvular heart disease     Mild to moderate mitral valve insufficiency and mild aortic insufficiency  . Chronic airway obstruction,steroid dependent   . Other and unspecified hyperlipidemia   . Chronic systolic heart failure     a. Ischemic cardiomyopathy ejection fraction 30-35% previously. b. Down to 15% by cath 03/2012.  [name] Cerebrovascular disease, unspecified   . Other mechanical complication of other internal orthopedic device, implant, and graft   . Carotid artery disease     s/p LCEA 2007.  [name] Head and neck cancer     S/P radiation therapy. "cancer free"  . Orthostatic hypotension   . Ventricular tachycardia     a. S/p ICD implantation. b. Recurrent in 03/2012 with medication adjustment. c. s/p RFCA 11/2012 (Dr. Greggory Brandy) ; d. Recent adm 06/2013 for VT/ICD shock after failing med rx with sotalol, mexiletine and metoprolol - repeat ablation  06-23-2013 by Dr Rayann Heman.  . Anemia     a. 06/2013 notes: "Given weight loss and iron deficiency, outpatient colonoscopy should be considered once more clinically stable."  . Pneumonia     a. Qualified for home O2 with ambulation during 06/2013 adm.  . Weight loss     Assessment: recurrent VT and multiple ICD shocks  80 y/o M c/o blurry vision, blacking out, HR dipped down into the 20's. Pt has pacer and AICD. Has had 2 admissions in the last month (recurrent VT/ICD shocks, then HAP and sepsis).   PMH:  significant for CAD, s/p CABG, ischemic cardiomyopathy, chronic systolic HF, VT s/p prior RFCA, paroxysmal AFib, COPD, DM, HTN and head and neck CA.  PTA Coumadin 2.5mg  po qHS  Patient's INR increased slightly after 4mg  given last night, now up to lower end of therapeutic at 2.05. Initial 01/02 H/H low at 9.2/29.6 and Plt wnl, no follow up yet.  No reports of bleeding noted.  Also on doxycyline antibiotic, which can potentiate effects of warfarin.   Goal of Therapy:  INR 2-3 Monitor platelets by anticoagulation protocol: Yes   Plan:  - give Coumadin 3mg  PO x 1 tonight  - daily INR - monitor for s/s of bleeding  Ovid Curd E. Jacqlyn Larsen, PharmD Clinical  Pharmacist - Resident Pager: [phone] Pharmacy: [phone] 08/08/2013 7:36 AM

## 2013-08-09 ENCOUNTER — Inpatient Hospital Stay (HOSPITAL_COMMUNITY): Payer: Medicare Other

## 2013-08-09 DIAGNOSIS — E2749 Other adrenocortical insufficiency: Secondary | ICD-10-CM

## 2013-08-09 LAB — PROCALCITONIN: Procalcitonin: 0.12 ng/mL

## 2013-08-09 LAB — CBC
HEMATOCRIT: 31.8 % — AB (ref 39.0–52.0)
Hemoglobin: 10.1 g/dL — ABNORMAL LOW (ref 13.0–17.0)
MCH: 26.2 pg (ref 26.0–34.0)
MCHC: 31.8 g/dL (ref 30.0–36.0)
MCV: 82.6 fL (ref 78.0–100.0)
Platelets: 457 10*3/uL — ABNORMAL HIGH (ref 150–400)
RBC: 3.85 MIL/uL — ABNORMAL LOW (ref 4.22–5.81)
RDW: 19 % — ABNORMAL HIGH (ref 11.5–15.5)
WBC: 10.1 10*3/uL (ref 4.0–10.5)

## 2013-08-09 LAB — BASIC METABOLIC PANEL
BUN: 18 mg/dL (ref 6–23)
CO2: 26 mEq/L (ref 19–32)
Calcium: 9.3 mg/dL (ref 8.4–10.5)
Chloride: 90 mEq/L — ABNORMAL LOW (ref 96–112)
Creatinine, Ser: 0.84 mg/dL (ref 0.50–1.35)
GFR calc Af Amer: >90 mL/min (ref >90)
GFR, EST NON AFRICAN AMERICAN: 79 mL/min — AB (ref >90)
GLUCOSE: 143 mg/dL — AB (ref 70–99)
Potassium: 4.2 mEq/L (ref 3.7–5.3)
SODIUM: 129 meq/L — AB (ref 137–147)

## 2013-08-09 LAB — PROTIME-INR
INR: 2.24 — AB (ref 0.00–1.49)
Prothrombin Time: 24.1 seconds — ABNORMAL HIGH (ref 11.6–15.2)

## 2013-08-09 LAB — GLUCOSE, CAPILLARY: Glucose-Capillary: 103 mg/dL — ABNORMAL HIGH (ref 70–99)

## 2013-08-09 MED ORDER — FUROSEMIDE 10 MG/ML IJ SOLN
60.0000 mg | Freq: Three times a day (TID) | INTRAMUSCULAR | Status: DC
  Administered 2013-08-09 – 2013-08-11 (×6): 60 mg via INTRAVENOUS
  Filled 2013-08-09 (×7): qty 6

## 2013-08-09 MED ORDER — SALMETEROL XINAFOATE 50 MCG/DOSE IN AEPB
1.0000 | INHALATION_SPRAY | Freq: Two times a day (BID) | RESPIRATORY_TRACT | Status: DC
  Administered 2013-08-09 – 2013-08-11 (×4): 1 via RESPIRATORY_TRACT
  Filled 2013-08-09: qty 0

## 2013-08-09 MED ORDER — FUROSEMIDE 10 MG/ML IJ SOLN
INTRAMUSCULAR | Status: AC
  Filled 2013-08-09: qty 8

## 2013-08-09 MED ORDER — FUROSEMIDE 10 MG/ML IJ SOLN
60.0000 mg | Freq: Three times a day (TID) | INTRAMUSCULAR | Status: DC
  Filled 2013-08-09: qty 6

## 2013-08-09 MED ORDER — FUROSEMIDE 10 MG/ML IJ SOLN: 60.0000 mg | Freq: Three times a day (TID) | INTRAMUSCULAR | Status: DC

## 2013-08-09 MED ORDER — WARFARIN SODIUM 2.5 MG PO TABS
2.5000 mg | ORAL_TABLET | Freq: Once | ORAL | Status: DC
  Filled 2013-08-09: qty 1

## 2013-08-09 NOTE — Progress Notes (Signed)
PULMONARY / CRITICAL CARE MEDICINE  Name: [name] MRN: [id number] DOB: [birth year]    ADMISSION DATE:  08/07/2013 CONSULTATION DATE:  08/07/2013  REFERRING MD :  Cardiology PRIMARY SERVICE:  Cardiology  CHIEF COMPLAINT:  Dyspnea   BRIEF PATIENT DESCRIPTION:  80 yo with ICM EF 30-35%, COPD (oxygen and systemic steroids dependent f/b Henderson) and chronic respiratory failure. Admitted on 1/2 with 3 days history of progressive SOB,  recurrent episodes of symptomatic VT resulting in his ICD firing. He was admitted for titration of his antiarrythmic regimen and generator change of his ICD. Developed marked worsening of dyspnea over the first 8 hours of admit. PCCM asked to assess in regards to his COPD as a contributing factor to his dyspnea.   SIGNIFICANT EVENTS / STUDIES:  1/2 - Admit with progressive SOB, recurrent VT  LINES / TUBES:  CULTURES: 1/2  MRSA PCR >>> Neg  ANTIBIOTICS: Cefazolin 1/2 x1 Doxycycline 1/2>>>  SUBJECTIVE: The patient some improvement in dyspnea, but states that his breathing is still worse than his baseline.  VITAL SIGNS: Temp:  [97 F (36.1 C)-97.7 F (36.5 C)] 97 F (36.1 C) (01/04 0751) Pulse Rate:  [77-86] 84 (01/04 0751) Resp:  [17-27] 26 (01/04 0751) BP: (87-99)/(40-61) 96/44 mmHg (01/04 0751) SpO2:  [97 %-100 %] 100 % (01/04 0751) Weight:  [141 lb 15.6 oz (64.4 kg)] 141 lb 15.6 oz (64.4 kg) (01/04 0500)  PHYSICAL EXAMINATION: General: alert, cooperative, and in no apparent distress HEENT: PERRL, EOMI, oropharynx clear and non-erythematous  Neck: supple Lungs: normal work of respiration, minimal wheeze Heart: regular rate and rhythm, no murmurs, gallops, or rubs Abdomen: soft, non-tender, non-distended, normal bowel sounds Extremities: mild abd edema, though no extremity edema Neurologic: alert & oriented X3, cranial nerves II-XII intact, strength grossly intact, sensation intact to light touch    Recent Labs Lab 08/07/13 0500  08/08/13 0550 08/09/13 0550  NA 137 135* 129*  K 3.3* 4.1 4.2  CL 96 94* 90*  CO2 29 29 26   BUN 13 13 18   CREATININE 0.77 0.73 0.84  GLUCOSE 93 162* 143*    Recent Labs Lab 08/07/13 0500 08/09/13 0550  HGB 9.2* 10.1*  HCT 29.6* 31.8*  WBC 7.1 10.1  PLT 352 457*   CXR: 1/4 >>>poor perfusion, but continued pulmonary vascular congestion (?mildly worse in RLL)  ASSESSMENT / PLAN:  Acute on chronic respiratory failure Acute on chronic systolic congestive heart failure Acute pulmonary edema COPD (on chronic oxygen and systemic steroids), possible exacerbation Recurrent VT with multiple ICD shocks PAF  Plan: - Lasix as renal fxn / bp permit - restart home inhaler regimen - continue budesonide, start salmeterol (formulary alt for formoterol). - continue Xopenex scheduled and prn - Continue oxygen prn, goal SpO2>92 - Continue prednisone 40 today.  Can taper over 7-10 days to home dose of 2.5 mg daily, starting taper tomorrow - Empiric Doxycycline, complete a 7-day total course (last day 1/9) - BiPAP contraindicated given nausea and vomiting - Careful titration of non-specific b-blockers in patient with obstructive lung disease  We will sign off at this time, please contact us with any further questions.  Elnora Morrison, PGY3 Pgr. [phone]  08/09/2013, 9:18 AM  Attending:  I have seen and examined the patient with nurse practitioner/resident and agree with the note above.   Jillyn Hidden PCCM Pager: [phone] Cell: [phone] If no response, call [phone]

## 2013-08-09 NOTE — Progress Notes (Signed)
Patient ID: [name], male   DOB: [birth year], 80 y.o.   MRN: [id number]   SUBJECTIVE: No chest pain but still short of breath.  Did not diurese particularly well yesterday.  Stable SBP in the 90s.  Frequent PVCs on telemetry with one run of what appeared to be atrial fibrillation with aberrancy (about 8 beats).   . budesonide  0.5 mg Nebulization BID  . carvedilol  6.25 mg Oral BID WC  . doxycycline (VIBRAMYCIN) IV  100 mg Intravenous Q12H  . ferrous sulfate  325 mg Oral Q breakfast  . furosemide  60 mg Intravenous Q8H  . levalbuterol  0.63 mg Nebulization Q6H  . megestrol  40 mg Oral Daily  . montelukast  10 mg Oral QHS  . pantoprazole  40 mg Oral Daily  . potassium chloride  40 mEq Oral Daily  . predniSONE  40 mg Oral Q breakfast  . simvastatin  40 mg Oral QHS  . sodium chloride  3 mL Intravenous Q12H  . sotalol  240 mg Oral Q12H  . Warfarin - Pharmacist Dosing Inpatient   Does not apply q1800      Filed Vitals:   08/09/13 0400 08/09/13 0500 08/09/13 0607 08/09/13 0751  BP: 99/40  93/61 96/44  Pulse: 82   84  Temp: 97.4 F (36.3 C)   97 F (36.1 C)  TempSrc: Oral   Oral  Resp: 17  22 26   Height:      Weight:  64.4 kg (141 lb 15.6 oz)    SpO2: 100%   100%    Intake/Output Summary (Last 24 hours) at 08/09/13 0934 Last data filed at 08/09/13 0559  Gross per 24 hour  Intake    620 ml  Output   1275 ml  Net   -655 ml    LABS: Basic Metabolic Panel:  Recent Labs  08/08/13 0550 08/09/13 0550  NA 135* 129*  K 4.1 4.2  CL 94* 90*  CO2 29 26  GLUCOSE 162* 143*  BUN 13 18  CREATININE 0.73 0.84  CALCIUM 8.8 9.3   Liver Function Tests: No results found for this basename: AST, ALT, ALKPHOS, BILITOT, PROT, ALBUMIN,  in the last 72 hours No results found for this basename: LIPASE, AMYLASE,  in the last 72 hours CBC:  Recent Labs  08/07/13 0500 08/09/13 0550  WBC 7.1 10.1  NEUTROABS 5.4  --   HGB 9.2* 10.1*  HCT 29.6* 31.8*  MCV 83.4 82.6  PLT 352 457*    Cardiac Enzymes:  Recent Labs  08/07/13 0616  TROPONINI <0.30   BNP: No components found with this basename: POCBNP,  D-Dimer: No results found for this basename: DDIMER,  in the last 72 hours Hemoglobin A1C: No results found for this basename: HGBA1C,  in the last 72 hours Fasting Lipid Panel: No results found for this basename: CHOL, HDL, LDLCALC, TRIG, CHOLHDL, LDLDIRECT,  in the last 72 hours Thyroid Function Tests: No results found for this basename: TSH, T4TOTAL, FREET3, T3FREE, THYROIDAB,  in the last 72 hours Anemia Panel: No results found for this basename: VITAMINB12, FOLATE, FERRITIN, TIBC, IRON, RETICCTPCT,  in the last 72 hours  RADIOLOGY: Dg Chest Portable 1 View  08/07/2013   CLINICAL DATA:  Shortness of breath, cough, congestion.  EXAM: PORTABLE CHEST - 1 VIEW  COMPARISON:  07/09/2013  FINDINGS: Stable appearance of postoperative changes and cardiac pacemaker. Cardiac enlargement with increasing pulmonary vascularity and developing interstitial changes suggesting vascular congestion edema. Small  bilateral pleural effusions. No pneumothorax.  IMPRESSION: Cardiac enlargement with developing pulmonary vascular congestion and interstitial edema. Small bilateral pleural effusions peer   Electronically Signed   By: Lucienne Capers M.D.   On: 08/07/2013 05:45    PHYSICAL EXAM General: NAD Neck: JVP 10-12 cm, no thyromegaly or thyroid nodule.  Lungs: Distant BS bilaterally CV: Nondisplaced PMI.  Heart regular S1/S2, no S3/S4, no murmur.  No peripheral edema.  Abdomen: Soft, nontender, no hepatosplenomegaly, no distention.  Neurologic: Alert and oriented x 3.  Psych: Normal affect. Extremities: No clubbing or cyanosis.   TELEMETRY: Reviewed telemetry pt in NSR with low voltage P waves  ASSESSMENT AND PLAN: 80 yo with history of CAD s/p CABG, ischemic CMP (EF 30-35% 7/13), VT s/p ablation with recurrence, COPD, PAF presented after receiving multiple ICD shocks for VT.    1. VT: ICD shocks at home.  He has had RFCA in past.  He did not tolerate amiodarone.  He did not tolerate adjunctive mexiletine treatment.  Seen by Dr. Rayann Heman, Coreg and sotalol increased.   - Needs ICD generator change Monday (ok for INR 2-3).  Will hold tonight's warfarin dose, restart tomorrow.  - Continue current sotalol, will need to follow QT interval with ECGs (getting ECG today).  - No BP room to increase Coreg further.  2. CAD: Doubt ACS, troponin normal.  I think that the VT was scar-related, possibly triggered by volume overload/CHF.  3. Acute on chronic systolic CHF: EF 61-44% in the past, echo done today.  He is volume overloaded on exam still. I think that this is probably the primary cause of his acute dyspnea. CXR with pulmonary edema. Stable creatinine, still short of breath.  - Increase Lasix to 60 mg IV every 8 hrs  - Continue Coreg but hold losartan with soft blood pressure.  4. COPD: No PNA on CXR, PCT not elevated. Treating for COPD exacerbation.  Still think that the acute dyspnea is mostly fluid.  5. PAF: Patient appears to be in NSR this morning with low voltage Ps.  He is on warfarin (to hold tonight prior to gen change tomorrow).   Loralie Champagne 08/09/2013 9:34 AM

## 2013-08-09 NOTE — Progress Notes (Signed)
ANTICOAGULATION CONSULT NOTE - Initial Consult  Pharmacy Consult for Coumadin Indication: atrial fibrillation  Allergies  Allergen Reactions  . Amiodarone     Intolerant    Patient Measurements: Ht: 5'9'' Wt: 64.4 kg  Vital Signs: Temp: 97.4 F (36.3 C) (01/04 0400) Temp src: Oral (01/04 0400) BP: 93/61 mmHg (01/04 0607) Pulse Rate: 82 (01/04 0400)  Labs:  Recent Labs  08/07/13 0500 08/07/13 0616 08/08/13 0550 08/09/13 0550  HGB 9.2*  --   --  10.1*  HCT 29.6*  --   --  31.8*  PLT 352  --   --  457*  LABPROT 21.4*  --  22.5* 24.1*  INR 1.92*  --  2.05* 2.24*  CREATININE 0.77  --  0.73  --   TROPONINI  --  <0.30  --   --     Estimated Creatinine Clearance: 64.8 ml/min (by C-G formula based on Cr of 0.73).   Medical History: Past Medical History  Diagnosis Date  . Hypertension   . Coronary artery disease     a. CABG 03/2006. b. NSTEMI 03/2012 following VT likely demand ischemia - grafts patent at cath.  . Diabetes mellitus   . Ischemic cardiomyopathy     a. With chronic systolic CHF.  [name] Syncope and collapse   . Atrial fibrillation     a. Taking sotalol - intolerant to amiodarone. b. On Coumadin.  . Valvular heart disease     Mild to moderate mitral valve insufficiency and mild aortic insufficiency  . Chronic airway obstruction,steroid dependent   . Other and unspecified hyperlipidemia   . Chronic systolic heart failure     a. Ischemic cardiomyopathy ejection fraction 30-35% previously. b. Down to 15% by cath 03/2012.  [name] Cerebrovascular disease, unspecified   . Other mechanical complication of other internal orthopedic device, implant, and graft   . Carotid artery disease     s/p LCEA 2007.  [name] Head and neck cancer     S/P radiation therapy. "cancer free"  . Orthostatic hypotension   . Ventricular tachycardia     a. S/p ICD implantation. b. Recurrent in 03/2012 with medication adjustment. c. s/p RFCA 11/2012 (Dr. Greggory Brandy) ; d. Recent adm 06/2013 for VT/ICD shock  after failing med rx with sotalol, mexiletine and metoprolol - repeat ablation 06-23-2013 by Dr Rayann Heman.  . Anemia     a. 06/2013 notes: "Given weight loss and iron deficiency, outpatient colonoscopy should be considered once more clinically stable."  . Pneumonia     a. Qualified for home O2 with ambulation during 06/2013 adm.  . Weight loss     Assessment: recurrent VT and multiple ICD shocks  80 y/o M c/o blurry vision, blacking out, HR dipped down into the 20's. Pt has pacer and AICD. Has had 2 admissions in the last month (recurrent VT/ICD shocks, then HAP and sepsis).   PMH:  significant for CAD, s/p CABG, ischemic cardiomyopathy, chronic systolic HF, VT s/p prior RFCA, paroxysmal AFib, COPD, DM, HTN and head and neck CA.  PTA Coumadin 2.5mg  po qHS   Patient's INR increased within the therapeutic range after 3mg  given last night, now up to 2.24. H/H low but stable and Plt elevated at 457.  No reports of bleeding noted.  Also on doxycyline antibiotic, which can potentiate effects of warfarin.   Goal of Therapy:  INR 2-3 Monitor platelets by anticoagulation protocol: Yes   Plan:  - give Coumadin 2.5mg  PO x 1 tonight  - daily INR -  monitor for s/s of bleeding  Renelda Mom. Jacqlyn Larsen, PharmD Clinical Pharmacist - Resident Pager: [phone] Pharmacy: [phone] 08/09/2013 7:18 AM

## 2013-08-10 ENCOUNTER — Encounter (HOSPITAL_COMMUNITY): Payer: Self-pay | Admitting: *Deleted

## 2013-08-10 ENCOUNTER — Encounter (HOSPITAL_COMMUNITY)
Admission: EM | Disposition: A | Discharge status: Hospice | Payer: Self-pay | Source: Home / Self Care | Attending: Internal Medicine

## 2013-08-10 ENCOUNTER — Encounter: Payer: Self-pay | Admitting: Internal Medicine

## 2013-08-10 DIAGNOSIS — T82198A Other mechanical complication of other cardiac electronic device, initial encounter: Secondary | ICD-10-CM

## 2013-08-10 DIAGNOSIS — I2589 Other forms of chronic ischemic heart disease: Secondary | ICD-10-CM

## 2013-08-10 HISTORY — PX: IMPLANTABLE CARDIOVERTER DEFIBRILLATOR (ICD) GENERATOR CHANGE: SHX5469

## 2013-08-10 LAB — GLUCOSE, CAPILLARY
GLUCOSE-CAPILLARY: 97 mg/dL (ref 70–99)
Glucose-Capillary: 157 mg/dL — ABNORMAL HIGH (ref 70–99)

## 2013-08-10 LAB — BASIC METABOLIC PANEL
BUN: 18 mg/dL (ref 6–23)
CALCIUM: 9.1 mg/dL (ref 8.4–10.5)
CO2: 30 mEq/L (ref 19–32)
CREATININE: 0.82 mg/dL (ref 0.50–1.35)
Chloride: 93 mEq/L — ABNORMAL LOW (ref 96–112)
GFR calc non Af Amer: 80 mL/min — ABNORMAL LOW (ref >90)
Glucose, Bld: 102 mg/dL — ABNORMAL HIGH (ref 70–99)
Potassium: 3.8 mEq/L (ref 3.7–5.3)
Sodium: 137 mEq/L (ref 137–147)

## 2013-08-10 LAB — CBC
HCT: 29.7 % — ABNORMAL LOW (ref 39.0–52.0)
Hemoglobin: 9.4 g/dL — ABNORMAL LOW (ref 13.0–17.0)
MCH: 25.9 pg — AB (ref 26.0–34.0)
MCHC: 31.6 g/dL (ref 30.0–36.0)
MCV: 81.8 fL (ref 78.0–100.0)
PLATELETS: 380 10*3/uL (ref 150–400)
RBC: 3.63 MIL/uL — ABNORMAL LOW (ref 4.22–5.81)
RDW: 18.9 % — ABNORMAL HIGH (ref 11.5–15.5)
WBC: 8.6 10*3/uL (ref 4.0–10.5)

## 2013-08-10 LAB — PROTIME-INR
INR: 2.09 — ABNORMAL HIGH (ref 0.00–1.49)
PROTHROMBIN TIME: 22.8 s — AB (ref 11.6–15.2)

## 2013-08-10 SURGERY — ICD GENERATOR CHANGE
Anesthesia: LOCAL

## 2013-08-10 MED ORDER — MIDAZOLAM HCL 5 MG/5ML IJ SOLN
INTRAMUSCULAR | Status: AC
  Filled 2013-08-10: qty 5

## 2013-08-10 MED ORDER — CHLORHEXIDINE GLUCONATE 4 % EX LIQD
CUTANEOUS | Status: AC
  Administered 2013-08-10: 08:00:00
  Filled 2013-08-10: qty 15

## 2013-08-10 MED ORDER — LIDOCAINE HCL (PF) 1 % IJ SOLN
INTRAMUSCULAR | Status: AC
  Filled 2013-08-10: qty 30

## 2013-08-10 MED ORDER — LEVALBUTEROL HCL 0.63 MG/3ML IN NEBU
0.6300 mg | INHALATION_SOLUTION | Freq: Four times a day (QID) | RESPIRATORY_TRACT | Status: DC
  Administered 2013-08-10 – 2013-08-11 (×3): 0.63 mg via RESPIRATORY_TRACT
  Filled 2013-08-10 (×8): qty 3

## 2013-08-10 MED ORDER — ONDANSETRON HCL 4 MG/2ML IJ SOLN: 4.0000 mg | Freq: Four times a day (QID) | INTRAMUSCULAR | Status: DC | PRN

## 2013-08-10 MED ORDER — CEFAZOLIN SODIUM-DEXTROSE 2-3 GM-% IV SOLR
2.0000 g | Freq: Once | INTRAVENOUS | Status: AC
  Filled 2013-08-10: qty 50

## 2013-08-10 MED ORDER — ACETAMINOPHEN 325 MG PO TABS: 325.0000 mg | ORAL_TABLET | ORAL | Status: DC | PRN

## 2013-08-10 MED ORDER — CEFAZOLIN SODIUM 1-5 GM-% IV SOLN
1.0000 g | Freq: Once | INTRAVENOUS | Status: DC
  Filled 2013-08-10: qty 50

## 2013-08-10 MED ORDER — FENTANYL CITRATE 0.05 MG/ML IJ SOLN
INTRAMUSCULAR | Status: AC
  Filled 2013-08-10: qty 2

## 2013-08-10 MED ORDER — SODIUM CHLORIDE 0.9 % IV SOLN: INTRAVENOUS | Status: AC

## 2013-08-10 MED ORDER — GENTAMICIN SULFATE 40 MG/ML IJ SOLN
Freq: Once | INTRAMUSCULAR | Status: AC
  Filled 2013-08-10: qty 2

## 2013-08-10 MED ORDER — WARFARIN SODIUM 4 MG PO TABS
4.0000 mg | ORAL_TABLET | Freq: Once | ORAL | Status: AC
  Filled 2013-08-10: qty 1

## 2013-08-10 MED ORDER — CEFAZOLIN SODIUM 1-5 GM-% IV SOLN
1.0000 g | Freq: Four times a day (QID) | INTRAVENOUS | Status: AC
  Administered 2013-08-10 – 2013-08-11 (×3): 1 g via INTRAVENOUS
  Filled 2013-08-10 (×4): qty 50

## 2013-08-10 NOTE — CV Procedure (Signed)
Preoperative diagnosis ICM VT Prev device at [facility] near erosion  Postoperative diagnosis same/ lead insulation breach  Procedure: Generator replacement   Lead repair pocket revision  Following informed consent the patient was brought to the electrophysiology laboratory in place of the fluoroscopic table in the supine position after routine prep and drape lidocaine was infiltrated in the region of the previous incision and carried down to later the device pocket using sharp dissection and electrocautery. The pocket was opened the device was freed up and was explanted.  Interrogation of the previously implanted ICD ventricular lead 4696  demonstrated an R wave of 14  millivolts., and impedance of 896 ohms, and a pacing threshold of 1.4 volts at 1.0 msec. .    The previously implanted atrial lead 1699 demonstrated a P-wave amplitude of 2.3 milllivolts  and impedance of  354 ohms, and a pacing threshold of  volts at 0.5@ 0.41milliseconds.  The leads were inspected. Repair of the r/s portion of ICD was needed. The leads were then attached to a St Jude  pulse generator, serial number [id number].    Through the device the P-wave amplitude  Was  2.0 milllivolts and impedance of  360 ohms, and a pacing threshold of  volts at 0.7.@ 0.5 milliseconds; the ICD ventricular lead demonstrated an R wave of 9.0  millivolts., and impedance of 940 ohms, and a pacing threshold of 1.5 volts at 1.0 msec and the    High voltage impedances were 50 ohms  The pocket was irrigated with antibiotic containing saline solution hemostasis was assured and the leads and the device were placed in the pocket. The wound was then closed in 3 layers in normal fashion following surgicell and anchoring of the device to the fascia  I should also note that the pocket was revised medially and inferiorly to move the device away from the atrophic skin  The patient tolerated the procedure without apparent complication. dermabond dressing was  applied  DFT testing was not  performed  Virl Axe   \

## 2013-08-10 NOTE — Interval H&P Note (Signed)
ICD Criteria  Current LVEF (within 6 months):30.%  NYHA Functional Classification: Class II  Heart Failure History:  Yes, Duration of heart failure since onset is > 9 months  Non-Ischemic Dilated Cardiomyopathy History:  No.  Atrial Fibrillation/Atrial Flutter:  Yes, A-Fib/A-Flutter type: Paroxysmal.  Ventricular Tachycardia History:  Yes, Hemodynamic instability present, VT Type:  SVT - Monomorphic.  Cardiac Arrest History:  No  History of Syndromes with Risk of Sudden Death:  No.  Previous ICD:  Yes, ICD Type:  Single, Reason for ICD:  Primary prevention.  LVEF is not available  Electrophysiology Study: No.  Prior MI: Yes, Most recent MI timeframe is > 40 days.  PPM: No.  OSA:  No  Patient Life Expectancy of >=1 year: Yes.  Anticoagulation Therapy:  Patient is on anticoagulation therapy, anticoagulation was NOT held prior to procedure.   Beta Blocker Therapy:  Yes.   Ace Inhibitor/ARB Therapy: Yes but on hold today 2/2 BP  History and Physical Interval Note:  08/10/2013 8:02 AM  [name]  has presented today for surgery, with the diagnosis of ERI  The various methods of treatment have been discussed with the patient and family. After consideration of risks, benefits and other options for treatment, the patient has consented to  Procedure(s): BIV PACEMAKER GENERATOR CHANGE OUT (N/A) as a surgical intervention .  The patient's history has been reviewed, patient examined, no change in status, stable for surgery.  I have reviewed the patient's chart and labs.  Questions were answered to the patient's satisfaction.     Virl Axe

## 2013-08-10 NOTE — Progress Notes (Signed)
Patient Name: [name]      SUBJECTIVE   Feels much better with breathing better than in weeks   Past Medical History  Diagnosis Date  . Hypertension   . Coronary artery disease     a. CABG 03/2006. b. NSTEMI 03/2012 following VT likely demand ischemia - grafts patent at cath.  . Diabetes mellitus   . Ischemic cardiomyopathy     a. With chronic systolic CHF.  [name] Syncope and collapse   . Atrial fibrillation     a. Taking sotalol - intolerant to amiodarone/mexilitene. b. On Coumadin.  . Valvular heart disease     Mild to moderate mitral valve insufficiency and mild aortic insufficiency  . Chronic airway obstruction,steroid dependent   . Other and unspecified hyperlipidemia   . Chronic systolic heart failure     a. Ischemic cardiomyopathy ejection fraction 30-35% previously. b. Down to 15% by cath 03/2012.  [name] Cerebrovascular disease, unspecified   . Other mechanical complication of other internal orthopedic device, implant, and graft   . Carotid artery disease     s/p LCEA 2007.  [name] Head and neck cancer     S/P radiation therapy. "cancer free"  . Orthostatic hypotension   . Ventricular tachycardia     a. S/p ICD implantation. b. Recurrent in 03/2012 with medication adjustment. c. s/p RFCA 11/2012 (Dr. Greggory Brandy) ; d. Recent adm 06/2013 for VT/ICD shock after failing med rx with sotalol, mexiletine and metoprolol - repeat ablation 06-23-2013 by Dr Rayann Heman.  . Anemia     a. 06/2013 notes: "Given weight loss and iron deficiency, outpatient colonoscopy should be considered once more clinically stable."  . Pneumonia     a. Qualified for home O2 with ambulation during 06/2013 adm.  . Weight loss     Scheduled Meds:  Scheduled Meds: . budesonide  0.5 mg Nebulization BID  . carvedilol  6.25 mg Oral BID WC  . doxycycline (VIBRAMYCIN) IV  100 mg Intravenous Q12H  . ferrous sulfate  325 mg Oral Q breakfast  . furosemide  60 mg Intravenous Q8H  . levalbuterol  0.63 mg  Nebulization Q6H  . megestrol  40 mg Oral Daily  . montelukast  10 mg Oral QHS  . pantoprazole  40 mg Oral Daily  . potassium chloride  40 mEq Oral Daily  . predniSONE  40 mg Oral Q breakfast  . salmeterol  1 puff Inhalation Q12H  . simvastatin  40 mg Oral QHS  . sodium chloride  3 mL Intravenous Q12H  . sotalol  240 mg Oral Q12H  . Warfarin - Pharmacist Dosing Inpatient   Does not apply q1800   Continuous Infusions: . sodium chloride 1,000 mL (08/10/13 0555)    PHYSICAL EXAM Filed Vitals:   08/09/13 2344 08/10/13 0355 08/10/13 0400 08/10/13 0443  BP: 100/60  92/63   Pulse: 81 85 84 89  Temp: 97.4 F (36.3 C) 97.5 F (36.4 C)    TempSrc: Oral Oral    Resp: 19 24 18 19   Height:      Weight:    138 lb 7.2 oz (62.8 kg)  SpO2: 98% 100% 99% 98%    General appearance: no distress Neck: JVD - 6 cm above sternal notch Lungs: clear to auscultation bilaterally Heart: regular rate and rhythm Extremities: edema  nono Skin: Skin color, texture, turgor normal. No rashes or lesions Neurologic: Alert and oriented X 3, normal strength and tone. Normal symmetric reflexes. Normal  coordination and gait murmur 2/6 LLSB Device pocket well healed; without hematoma or erythema.  There is no tethering   TELEMETRY: Reviewed telemetry pt in nsr    Intake/Output Summary (Last 24 hours) at 08/10/13 0748 Last data filed at 08/10/13 0700  Gross per 24 hour  Intake   1140 ml  Output   3100 ml  Net  -1960 ml    LABS: Basic Metabolic Panel:  Recent Labs Lab 08/07/13 0500 08/08/13 0550 08/09/13 0550 08/10/13 0402  NA 137 135* 129* 137  K 3.3* 4.1 4.2 3.8  CL 96 94* 90* 93*  CO2 29 29 26 30   GLUCOSE 93 162* 143* 102*  BUN 13 13 18 18   CREATININE 0.77 0.73 0.84 0.82  CALCIUM 8.7 8.8 9.3 9.1   Cardiac Enzymes: No results found for this basename: CKTOTAL, CKMB, CKMBINDEX, TROPONINI,  in the last 72 hours CBC:  Recent Labs Lab 08/07/13 0500 08/09/13 0550 08/10/13 0402  WBC 7.1  10.1 8.6  NEUTROABS 5.4  --   --   HGB 9.2* 10.1* 9.4*  HCT 29.6* 31.8* 29.7*  MCV 83.4 82.6 81.8  PLT 352 457* 380   PROTIME:  Recent Labs  08/08/13 0550 08/09/13 0550 08/10/13 0402  LABPROT 22.5* 24.1* 22.8*  INR 2.05* 2.24* 2.09*   Liver Function Tests: No results found for this basename: AST, ALT, ALKPHOS, BILITOT, PROT, ALBUMIN,  in the last 72 hours No results found for this basename: LIPASE, AMYLASE,  in the last 72 hours BNP: BNP (last 3 results)  Recent Labs  07/03/13 2314 08/07/13 0616  PROBNP 9510.0* 5086.0*   ts:    ASSESSMENT AND PLAN:  Active Problems:   VT (ventricular tachycardia)   Pulmonary edema   COPD exacerbation   Ventricular tachycardia  For ICD generator change this am We have reviewed the benefits and risks of generator replacement.  These include but are not limited to lead fracture and infection.  The patient understands, agrees and is willing to proceed.   euvoliemic Wonder also about the role of quinidine adjunctively to sotalol EF 30-35 % and CHGF may struggle with very high dose sotalol Signed, Virl Axe MD  08/10/2013

## 2013-08-10 NOTE — H&P (View-Only) (Signed)
     Patient Name: [name]      SUBJECTIVE   Feels much better with breathing better than in weeks   Past Medical History  Diagnosis Date  . Hypertension   . Coronary artery disease     a. CABG 03/2006. b. NSTEMI 03/2012 following VT likely demand ischemia - grafts patent at cath.  . Diabetes mellitus   . Ischemic cardiomyopathy     a. With chronic systolic CHF.  . Syncope and collapse   . Atrial fibrillation     a. Taking sotalol - intolerant to amiodarone/mexilitene. b. On Coumadin.  . Valvular heart disease     Mild to moderate mitral valve insufficiency and mild aortic insufficiency  . Chronic airway obstruction,steroid dependent   . Other and unspecified hyperlipidemia   . Chronic systolic heart failure     a. Ischemic cardiomyopathy ejection fraction 30-35% previously. b. Down to 15% by cath 03/2012.  . Cerebrovascular disease, unspecified   . Other mechanical complication of other internal orthopedic device, implant, and graft   . Carotid artery disease     s/p LCEA 2007.  . Head and neck cancer     S/P radiation therapy. "cancer free"  . Orthostatic hypotension   . Ventricular tachycardia     a. S/p ICD implantation. b. Recurrent in 03/2012 with medication adjustment. c. s/p RFCA 11/2012 (Dr. JA) ; d. Recent adm 06/2013 for VT/ICD shock after failing med rx with sotalol, mexiletine and metoprolol - repeat ablation 06-23-2013 by Dr Allred.  . Anemia     a. 06/2013 notes: "Given weight loss and iron deficiency, outpatient colonoscopy should be considered once more clinically stable."  . Pneumonia     a. Qualified for home O2 with ambulation during 06/2013 adm.  . Weight loss     Scheduled Meds:  Scheduled Meds: . budesonide  0.5 mg Nebulization BID  . carvedilol  6.25 mg Oral BID WC  . doxycycline (VIBRAMYCIN) IV  100 mg Intravenous Q12H  . ferrous sulfate  325 mg Oral Q breakfast  . furosemide  60 mg Intravenous Q8H  . levalbuterol  0.63 mg  Nebulization Q6H  . megestrol  40 mg Oral Daily  . montelukast  10 mg Oral QHS  . pantoprazole  40 mg Oral Daily  . potassium chloride  40 mEq Oral Daily  . predniSONE  40 mg Oral Q breakfast  . salmeterol  1 puff Inhalation Q12H  . simvastatin  40 mg Oral QHS  . sodium chloride  3 mL Intravenous Q12H  . sotalol  240 mg Oral Q12H  . Warfarin - Pharmacist Dosing Inpatient   Does not apply q1800   Continuous Infusions: . sodium chloride 1,000 mL (08/10/13 0555)    PHYSICAL EXAM Filed Vitals:   08/09/13 2344 08/10/13 0355 08/10/13 0400 08/10/13 0443  BP: 100/60  92/63   Pulse: 81 85 84 89  Temp: 97.4 F (36.3 C) 97.5 F (36.4 C)    TempSrc: Oral Oral    Resp: 19 24 18 19  Height:      Weight:    138 lb 7.2 oz (62.8 kg)  SpO2: 98% 100% 99% 98%    General appearance: no distress Neck: JVD - 6 cm above sternal notch Lungs: clear to auscultation bilaterally Heart: regular rate and rhythm Extremities: edema  nono Skin: Skin color, texture, turgor normal. No rashes or lesions Neurologic: Alert and oriented X 3, normal strength and tone. Normal symmetric reflexes. Normal   coordination and gait murmur 2/6 LLSB Device pocket well healed; without hematoma or erythema.  There is no tethering   TELEMETRY: Reviewed telemetry pt in nsr    Intake/Output Summary (Last 24 hours) at 08/10/13 0748 Last data filed at 08/10/13 0700  Gross per 24 hour  Intake   1140 ml  Output   3100 ml  Net  -1960 ml    LABS: Basic Metabolic Panel:  Recent Labs Lab 08/07/13 0500 08/08/13 0550 08/09/13 0550 08/10/13 0402  NA 137 135* 129* 137  K 3.3* 4.1 4.2 3.8  CL 96 94* 90* 93*  CO2 29 29 26 30   GLUCOSE 93 162* 143* 102*  BUN 13 13 18 18   CREATININE 0.77 0.73 0.84 0.82  CALCIUM 8.7 8.8 9.3 9.1   Cardiac Enzymes: No results found for this basename: CKTOTAL, CKMB, CKMBINDEX, TROPONINI,  in the last 72 hours CBC:  Recent Labs Lab 08/07/13 0500 08/09/13 0550 08/10/13 0402  WBC 7.1  10.1 8.6  NEUTROABS 5.4  --   --   HGB 9.2* 10.1* 9.4*  HCT 29.6* 31.8* 29.7*  MCV 83.4 82.6 81.8  PLT 352 457* 380   PROTIME:  Recent Labs  08/08/13 0550 08/09/13 0550 08/10/13 0402  LABPROT 22.5* 24.1* 22.8*  INR 2.05* 2.24* 2.09*   Liver Function Tests: No results found for this basename: AST, ALT, ALKPHOS, BILITOT, PROT, ALBUMIN,  in the last 72 hours No results found for this basename: LIPASE, AMYLASE,  in the last 72 hours BNP: BNP (last 3 results)  Recent Labs  07/03/13 2314 08/07/13 0616  PROBNP 9510.0* 5086.0*   ts:    ASSESSMENT AND PLAN:  Active Problems:   VT (ventricular tachycardia)   Pulmonary edema   COPD exacerbation   Ventricular tachycardia  For ICD generator change this am We have reviewed the benefits and risks of generator replacement.  These include but are not limited to lead fracture and infection.  The patient understands, agrees and is willing to proceed.   euvoliemic Wonder also about the role of quinidine adjunctively to sotalol EF 30-35 % and CHGF may struggle with very high dose sotalol Signed, Virl Axe MD  08/10/2013

## 2013-08-10 NOTE — Progress Notes (Signed)
ANTICOAGULATION CONSULT NOTE - Follow Up Consult  Pharmacy Consult for Coumadin  Indication: atrial fibrillation  Allergies  Allergen Reactions  . Amiodarone     Intolerant    Patient Measurements: Height: 5\' 9"  (175.3 cm) Weight: 138 lb 7.2 oz (62.8 kg) IBW/kg (Calculated) : 70.7  Vital Signs: Temp: 97.4 F (36.3 C) (01/05 1347) Temp src: Oral (01/05 1347) BP: 87/48 mmHg (01/05 1347) Pulse Rate: 79 (01/05 1347)  Labs:  Recent Labs  08/08/13 0550 08/09/13 0550 08/10/13 0402  HGB  --  10.1* 9.4*  HCT  --  31.8* 29.7*  PLT  --  457* 380  LABPROT 22.5* 24.1* 22.8*  INR 2.05* 2.24* 2.09*  CREATININE 0.73 0.84 0.82    Estimated Creatinine Clearance: 61.7 ml/min (by C-G formula based on Cr of 0.82).  Assessment: 80 y/o male admitted for ICD change on chronic Coumadin for Afib. INR is therapeutic at low end of goal today - will give extra. No bleeding noted, CBC stable.  Goal of Therapy:  INR 2-3 Monitor platelets by anticoagulation protocol: Yes   Plan:  -Coumadin 4 mg PO x1 today -INR daily -Monitor for s/sx bleeding  [facility], Pharm.D., BCPS Clinical Pharmacist Pager: [phone] 08/10/2013 2:12 PM

## 2013-08-11 ENCOUNTER — Inpatient Hospital Stay (HOSPITAL_COMMUNITY): Payer: Medicare Other

## 2013-08-11 DIAGNOSIS — J189 Pneumonia, unspecified organism: Secondary | ICD-10-CM

## 2013-08-11 LAB — PROTIME-INR
INR: 2.28 — AB (ref 0.00–1.49)
Prothrombin Time: 24.4 seconds — ABNORMAL HIGH (ref 11.6–15.2)

## 2013-08-11 LAB — GLUCOSE, CAPILLARY
GLUCOSE-CAPILLARY: 131 mg/dL — AB (ref 70–99)
Glucose-Capillary: 153 mg/dL — ABNORMAL HIGH (ref 70–99)

## 2013-08-11 MED ORDER — FUROSEMIDE 10 MG/ML IJ SOLN: 80.0000 mg | Freq: Three times a day (TID) | INTRAMUSCULAR | Status: DC

## 2013-08-11 MED ORDER — LORAZEPAM 1 MG PO TABS: 1.0000 mg | ORAL_TABLET | Freq: Four times a day (QID) | ORAL | Status: AC | PRN

## 2013-08-11 MED ORDER — WARFARIN SODIUM 4 MG PO TABS
4.0000 mg | ORAL_TABLET | Freq: Once | ORAL | Status: DC
  Filled 2013-08-11: qty 1

## 2013-08-11 MED ORDER — ALPRAZOLAM 0.25 MG PO TABS
0.2500 mg | ORAL_TABLET | Freq: Once | ORAL | Status: AC
  Administered 2013-08-11: 0.25 mg via ORAL
  Filled 2013-08-11: qty 1

## 2013-08-11 MED ORDER — DIPHENHYDRAMINE HCL 50 MG PO CAPS: 50.0000 mg | ORAL_CAPSULE | Freq: Every day | ORAL | Status: DC

## 2013-08-11 MED ORDER — MORPHINE SULFATE (CONCENTRATE) 10 MG /0.5 ML PO SOLN: 5.0000 mg | ORAL | Status: AC | PRN

## 2013-08-11 MED ORDER — LORAZEPAM 1 MG PO TABS
1.0000 mg | ORAL_TABLET | Freq: Once | ORAL | Status: AC
  Administered 2013-08-11: 1 mg via ORAL

## 2013-08-11 MED ORDER — FUROSEMIDE 80 MG PO TABS: 80.0000 mg | ORAL_TABLET | Freq: Two times a day (BID) | ORAL | Status: AC

## 2013-08-11 MED ORDER — ZOLPIDEM TARTRATE 5 MG PO TABS: 5.0000 mg | ORAL_TABLET | Freq: Every evening | ORAL | Status: DC | PRN

## 2013-08-11 MED ORDER — LORAZEPAM 2 MG/ML IJ SOLN
0.5000 mg | Freq: Once | INTRAMUSCULAR | Status: AC
  Administered 2013-08-11: 0.5 mg via INTRAVENOUS
  Filled 2013-08-11: qty 1

## 2013-08-11 MED ORDER — POTASSIUM CHLORIDE ER 20 MEQ PO TBCR: 40.0000 meq | EXTENDED_RELEASE_TABLET | Freq: Every day | ORAL | Status: AC

## 2013-08-11 MED ORDER — LORAZEPAM 1 MG PO TABS
1.0000 mg | ORAL_TABLET | Freq: Once | ORAL | Status: DC
  Filled 2013-08-11: qty 1

## 2013-08-11 MED ORDER — SOTALOL HCL 240 MG PO TABS: 240.0000 mg | ORAL_TABLET | Freq: Two times a day (BID) | ORAL | Status: DC

## 2013-08-11 MED ORDER — DOXYCYCLINE HYCLATE 100 MG PO TABS: 100.0000 mg | ORAL_TABLET | Freq: Two times a day (BID) | ORAL | Status: AC

## 2013-08-11 NOTE — Discharge Summary (Signed)
Discharge Summary   Patient ID: [name],  MRN: [id number], DOB/AGE: 1928-11-22 80 y.o.  Admit date: 08/07/2013 Discharge date: 08/11/2013  Primary Care Provider: Matthias Hughs B Primary Cardiologist: Court Joy, MD/J. Albertia Carvin, MD   Discharge Diagnoses Principal Problem:   Ventricular tachycardia with ICD discharges  **Status post ICD generator change this admission.  Active Problems:   ISCHEMIC CARDIOMYOPATHY   Acute on chronic systolic CHF (congestive heart failure)   Acute-on-chronic respiratory failure   DM   Coronary artery disease   COPD exacerbation   DYSLIPIDEMIA   Atrial fibrillation   Hx of CABG   ICD (implantable cardioverter-defibrillator) in place   Chronic anticoagulation - coumadin  Allergies Allergies  Allergen Reactions  . Amiodarone     Intolerant   Procedures  2D Echocardiogram 1.23.2015  Study Conclusions  - Left ventricle: The cavity size was mildly dilated. Wall   thickness was increased in a pattern of moderate LVH.   Systolic function was moderately to severely reduced. The   estimated ejection fraction was in the range of 30% to   35%. There is akinesis and scarring of the inferolateral   and inferior myocardium. There is akinesis of the   distalanteroseptal and apical myocardium. Doppler   parameters are consistent with restrictive physiology,   indicative of decreased left ventricular diastolic   compliance and/or increased left atrial pressure. - Aortic valve: Mildly calcified annulus. Trileaflet; mildly   calcified leaflets. Moderate thickening, calcification,   and nodularity involving the noncoronary cusp. Mild   regurgitation. - Mitral valve: Calcified annulus. Severe regurgitation.   Effective regurgitant orifice: 0.72cm^2 (PISA).   Regurgitant volume: 1108ml (PISA). - Left atrium: The atrium was moderately to severely   dilated. - Right ventricle: Device wire or catheter noted in right   ventricle. Systolic function was  mildly reduced. - Right atrium: Central venous pressure: 25mm Hg (est). - Atrial septum: No defect or patent foramen ovale was   identified. - Tricuspid valve: Mild regurgitation. - Pulmonary arteries: PA peak pressure: 65mm Hg (S). PA   pressure: 72mm Hg (ED). - Pericardium, extracardiac: There was no pericardial   effusion. _____________  AICD Generator Change 1.5.2014  St Jude pulse generator, ser # [id number] _____________   History of Present Illness  80 year old man with CAD, s/p CABG, ischemic cardiomyopathy, chronic systolic HF, VT s/p prior RFCA, paroxysmal AFib, COPD, DM, HTN and head and neck CA.  He presented to the [facility] ED on 1/2 secondary to recurrent VT with multiple ICD shocks.  Episodes were associated with syncope.  In the ED, his device was interrogated and showed appropriate ICD shocks for fast VT.  ICD was also noted to have reached elective replacement indicators.  He was dyspneic in the ED and was admitted for further evaluation.  Hospital Course  Following admission, patient remained dyspneic and volume overloaded.  Critical care medicine was consulted and recommended continued IV diuresis, nebulizers, stress dose steroids, and empiric doxycycline therapy.  Sotalol was increased in the setting of VT.  He initially diuresed reasonably well with some symptomatic improvement but on 1/4, his lasix dose was increased secondary to ongoing dyspnea and ongoing pulmonary edema on cxr.  Volume status improved by 1/5 and he was felt to be stable for ICD generator change.  This was successfully carried out however post-procedure, he again developed progressive dyspnea and was maintained on IV lasix.  An inpatient CHF consult was called and after evaluating the patient and having a discussion with  the patient and family, it was felt that ultimately he would benefit most from a palliative care assessment and hospice referral.  Patient was agreeable to this however insisted on a speedy  discharge and was not willing to stay for inpatient palliative care assessment.  Palliative care was called and recommendations for prn Roxanol and Ativan were received.  Patient will be seen by hospice in his home tomorrow morning at which point further recommendations will be made.  He is being discharged this afternoon in fair condition.  Discharge Vitals Blood pressure 110/64, pulse 70, temperature 97.3 F (36.3 C), temperature source Oral, resp. rate 24, height 5\' 9"  (1.753 m), weight 140 lb 1.6 oz (63.549 kg), SpO2 98.00%.  Filed Weights   08/10/13 0443 08/11/13 0459 08/11/13 0625  Weight: 138 lb 7.2 oz (62.8 kg) 141 lb 6.4 oz (64.139 kg) 140 lb 1.6 oz (63.549 kg)   Labs  CBC  Recent Labs  08/09/13 0550 08/10/13 0402  WBC 10.1 8.6  HGB 10.1* 9.4*  HCT 31.8* 29.7*  MCV 82.6 81.8  PLT 457* [id number]   Basic Metabolic Panel  Recent Labs  08/09/13 0550 08/10/13 0402  NA 129* 137  K 4.2 3.8  CL 90* 93*  CO2 26 30  GLUCOSE 143* 102*  BUN 18 18  CREATININE 0.84 0.82  CALCIUM 9.3 9.1   Lab Results  Component Value Date   INR 2.28* 08/11/2013   INR 2.09* 08/10/2013   INR 2.24* 08/09/2013   Lab Results  Component Value Date   TROPONINI <0.30 08/07/2013   Disposition  Pt is being discharged home today in good condition.  Follow-up Plans & Appointments  Follow-up Information   Follow up with Herminio Commons, MD On 08/24/2013. (2:40 PM)    Specialty:  Cardiology   Contact information:   [address] [place] [address] [phone]       Follow up with TAPPER,DAVID B, MD.   Specialty:  Family Medicine   Contact information:   [address] [place] [address] [phone]      Discharge Medications    Medication List    STOP taking these medications       losartan 25 MG tablet  Commonly known as:  COZAAR      TAKE these medications       albuterol 108 (90 BASE) MCG/ACT inhaler  Commonly known as:  PROVENTIL HFA;VENTOLIN HFA  Inhale 2  puffs into the lungs every 6 (six) hours as needed for wheezing or shortness of breath.     budesonide 0.5 MG/2ML nebulizer solution  Commonly known as:  PULMICORT  Take 0.5 mg by nebulization 2 (two) times daily.     carvedilol 3.125 MG tablet  Commonly known as:  COREG  Take 1 tablet (3.125 mg total) by mouth 2 (two) times daily with a meal.     doxycycline 100 MG tablet  Commonly known as:  VIBRA-TABS  Take 1 tablet (100 mg total) by mouth 2 (two) times daily.     ferrous sulfate 325 (65 FE) MG tablet  Take 325 mg by mouth daily with breakfast.     formoterol 20 MCG/2ML nebulizer solution  Commonly known as:  PERFOROMIST  Take 20 mcg by nebulization 2 (two) times daily.     furosemide 80 MG tablet  Commonly known as:  LASIX  Take 1 tablet (80 mg total) by mouth 2 (two) times daily.     LORazepam 1 MG  tablet  Commonly known as:  ATIVAN  Take 1 tablet (1 mg total) by mouth every 6 (six) hours as needed for anxiety.     montelukast 10 MG tablet  Commonly known as:  SINGULAIR  Take 10 mg by mouth at bedtime.     morphine CONCENTRATE 10 mg / 0.5 ml concentrated solution  Take 0.25-0.5 mLs (5-10 mg total) by mouth every 2 (two) hours as needed for severe pain.     nitroGLYCERIN 0.4 MG SL tablet  Commonly known as:  NITROSTAT  Place 1 tablet (0.4 mg total) under the tongue every 5 (five) minutes as needed for chest pain.     omeprazole 40 MG capsule  Commonly known as:  PRILOSEC  Take 40 mg by mouth daily.     Potassium Chloride ER 20 MEQ Tbcr  Take 40 mEq by mouth daily.     predniSONE 5 MG tablet  Commonly known as:  DELTASONE  Take 0.5 tablets (2.5 mg total) by mouth daily. Continue after prednisone taper.     simvastatin 40 MG tablet  Commonly known as:  ZOCOR  Take 40 mg by mouth at bedtime.     sotalol 240 MG tablet  Commonly known as:  BETAPACE  Take 1 tablet (240 mg total) by mouth 2 (two) times daily.     warfarin 5 MG tablet  Commonly known as:   COUMADIN  Take 0.5 tablets (2.5 mg total) by mouth at bedtime.       Outstanding Labs/Studies  None  Duration of Discharge Encounter   Greater than 30 minutes including physician time.  Signed, Murray Hodgkins NP 08/11/2013, 4:03 PM  Thompson Grayer MD

## 2013-08-11 NOTE — Progress Notes (Addendum)
PULMONARY / CRITICAL CARE MEDICINE  Name: [name] MRN: [id number] DOB: 11-20-1928    ADMISSION DATE:  08/07/2013 CONSULTATION DATE:  08/07/2013  REFERRING MD :  Cardiology PRIMARY SERVICE:  Cardiology  CHIEF COMPLAINT:  Dyspnea   BRIEF PATIENT DESCRIPTION:  80 yo with ICM EF 30-35%, COPD (oxygen and systemic steroids dependent f/b [place]) and chronic respiratory failure. Admitted on 1/2 with 3 days history of progressive SOB,  recurrent episodes of symptomatic VT resulting in his ICD firing. He was admitted for titration of his antiarrythmic regimen and generator change of his ICD. Developed marked worsening of dyspnea over the first 8 hours of admit. PCCM asked to assess in regards to his COPD as a contributing factor to his dyspnea.   SIGNIFICANT EVENTS / STUDIES:  1/2 - Admit with progressive SOB, recurrent VT 1/4 - tx to floor, SOB improved, PCCM s/o 1/5 - new pacemaker inserted per Dr. Rayann Heman 1/6 - worsening SOB, PCCM called back for increased SOB  CULTURES: 1/2  MRSA PCR >>> Neg  ANTIBIOTICS: Cefazolin 1/2 x1 Doxycycline 1/2>>>  SUBJECTIVE: Pt reports no sleep, feeling anxious and that his breathing treatments aren't lasting long enough.  Weight up 2 lbs  VITAL SIGNS: Temp:  [97.2 F (36.2 C)-98.3 F (36.8 C)] 97.2 F (36.2 C) (01/06 0459) Pulse Rate:  [68-86] 72 (01/06 0459) Resp:  [16-18] 18 (01/05 2104) BP: (80-110)/(48-61) 110/59 mmHg (01/06 0459) SpO2:  [100 %] 100 % (01/06 0459) Weight:  [140 lb 1.6 oz (63.549 kg)-141 lb 6.4 oz (64.139 kg)] 140 lb 1.6 oz (63.549 kg) (01/06 0625)  PHYSICAL EXAMINATION: General: alert, cooperative, and in no apparent distress HEENT: PERRL, EOMI, oropharynx clear and non-erythematous  Neck: supple Lungs: mild work of breathing/ prolonged exp phase, lungs with few faint wheezes L>R Heart: regular rate and rhythm, no murmurs, gallops, or rubs Abdomen: soft, non-tender, non-distended, normal bowel sounds Extremities: mild  abd edema, though no extremity edema Neurologic: alert & oriented X3, cranial nerves II-XII intact, strength grossly intact   Recent Labs Lab 08/08/13 0550 08/09/13 0550 08/10/13 0402  NA 135* 129* 137  K 4.1 4.2 3.8  CL 94* 90* 93*  CO2 29 26 30   BUN 13 18 18   CREATININE 0.73 0.84 0.82  GLUCOSE 162* 143* 102*    Recent Labs Lab 08/07/13 0500 08/09/13 0550 08/10/13 0402  HGB 9.2* 10.1* 9.4*  HCT 29.6* 31.8* 29.7*  WBC 7.1 10.1 8.6  PLT 352 457* 380   CXR: 1/4 >>>poor perfusion, but continued pulmonary vascular congestion (?mildly worse in RLL)  ASSESSMENT / PLAN:  Acute on chronic respiratory failure Acute on chronic systolic congestive heart failure Acute pulmonary edema COPD (on chronic oxygen and systemic steroids), possible exacerbation Recurrent VT with multiple ICD shocks s/p ICD revision 1/5 PAF  - CXR today - Lasix 80 q8h - Continue Budesonide, Salmeterol (formulary alt for formoterol). - Continue Xopenex scheduled and prn, give Q3 for next 3 doses, discussed with RN - Continue oxygen prn, goal SpO2>92 - Continue prednisone 40 daily, when able, taper over 7-10 days to home dose of 2.5 mg daily - Empiric Doxycycline, complete a 7-day total course (last day 1/9) - BiPAP contraindicated given nausea and vomiting hx - Careful titration of non-specific b-blockers in patient with obstructive lung disease - Xanax 0.25 x 1 and Benadryl at night -Wife was inquiring about Palliative Care / Hospice which I think is very appropriate - will get them involved  Noe Gens, NP-C [facility] Pgr: [phone] or [phone]  I have personally obtained history, examined patient, evaluated and interpreted laboratory and imaging results, reviewed medical records, formulated assessment / plan and placed orders.  Doree Fudge, MD Pulmonary and [place] Pager: [phone]  08/11/2013, 1:48 PM

## 2013-08-11 NOTE — Progress Notes (Signed)
Chaplain offered ministry of presence, emotional and spiritual support. Patient's wife was at bedside. Patient's wife expressed "that she wanted her husband to get well." Chaplain offered spiritual support through prayer.   08/11/13 1500  Clinical Encounter Type  Visited With Patient and family together  Visit Type Initial;Spiritual support  Referral From Nurse

## 2013-08-11 NOTE — Discharge Instructions (Signed)
***  PLEASE REMEMBER TO BRING ALL OF YOUR MEDICATIONS TO EACH OF YOUR FOLLOW-UP OFFICE VISITS.  

## 2013-08-11 NOTE — Progress Notes (Addendum)
Patient ID: [name], male   DOB: 11/25/1928, 80 y.o.   MRN: [id number]   SUBJECTIVE: Yesterday had ICD generator changed. Still continues to have SOB. Repeat ECHO EF 30-35%, severe [name] and RV mildly reduced. Cr stable. Weight down 1 lb. Reports over the last month has had decline and noticed increased SOB.     [name] budesonide  0.5 mg Nebulization BID  . carvedilol  6.25 mg Oral BID WC  . doxycycline (VIBRAMYCIN) IV  100 mg Intravenous Q12H  . ferrous sulfate  325 mg Oral Q breakfast  . furosemide  60 mg Intravenous Q8H  . levalbuterol  0.63 mg Nebulization Q6H  . megestrol  40 mg Oral Daily  . montelukast  10 mg Oral QHS  . pantoprazole  40 mg Oral Daily  . potassium chloride  40 mEq Oral Daily  . predniSONE  40 mg Oral Q breakfast  . salmeterol  1 puff Inhalation Q12H  . simvastatin  40 mg Oral QHS  . sodium chloride  3 mL Intravenous Q12H  . sotalol  240 mg Oral Q12H  . Warfarin - Pharmacist Dosing Inpatient   Does not apply q1800      Filed Vitals:   08/10/13 2104 08/11/13 0058 08/11/13 0459 08/11/13 0625  BP: 90/53  110/59   Pulse: 70  72   Temp: 98.3 F (36.8 C)  97.2 F (36.2 C)   TempSrc: Oral  Axillary   Resp: 18     Height:      Weight:   141 lb 6.4 oz (64.139 kg) 140 lb 1.6 oz (63.549 kg)  SpO2: 100% 100% 100%     Intake/Output Summary (Last 24 hours) at 08/11/13 0947 Last data filed at 08/11/13 0739  Gross per 24 hour  Intake    350 ml  Output    850 ml  Net   -500 ml    LABS: Basic Metabolic Panel:  Recent Labs  08/09/13 0550 08/10/13 0402  NA 129* 137  K 4.2 3.8  CL 90* 93*  CO2 26 30  GLUCOSE 143* 102*  BUN 18 18  CREATININE 0.84 0.82  CALCIUM 9.3 9.1   Liver Function Tests: No results found for this basename: AST, ALT, ALKPHOS, BILITOT, PROT, ALBUMIN,  in the last 72 hours No results found for this basename: LIPASE, AMYLASE,  in the last 72 hours CBC:  Recent Labs  08/09/13 0550 08/10/13 0402  WBC 10.1 8.6  HGB 10.1* 9.4*    HCT 31.8* 29.7*  MCV 82.6 81.8  PLT 457* 380   Cardiac Enzymes: No results found for this basename: CKTOTAL, CKMB, CKMBINDEX, TROPONINI,  in the last 72 hours BNP: No components found with this basename: POCBNP,  D-Dimer: No results found for this basename: DDIMER,  in the last 72 hours Hemoglobin A1C: No results found for this basename: HGBA1C,  in the last 72 hours Fasting Lipid Panel: No results found for this basename: CHOL, HDL, LDLCALC, TRIG, CHOLHDL, LDLDIRECT,  in the last 72 hours Thyroid Function Tests: No results found for this basename: TSH, T4TOTAL, FREET3, T3FREE, THYROIDAB,  in the last 72 hours Anemia Panel: No results found for this basename: VITAMINB12, FOLATE, FERRITIN, TIBC, IRON, RETICCTPCT,  in the last 72 hours  RADIOLOGY: Dg Chest Portable 1 View  08/07/2013   CLINICAL DATA:  Shortness of breath, cough, congestion.  EXAM: PORTABLE CHEST - 1 VIEW  COMPARISON:  07/09/2013  FINDINGS: Stable appearance of postoperative changes and cardiac pacemaker. Cardiac enlargement  with increasing pulmonary vascularity and developing interstitial changes suggesting vascular congestion edema. Small bilateral pleural effusions. No pneumothorax.  IMPRESSION: Cardiac enlargement with developing pulmonary vascular congestion and interstitial edema. Small bilateral pleural effusions peer   Electronically Signed   By: Burman Nieves M.D.   On: 08/07/2013 05:45    PHYSICAL EXAM General: Visibly dyspneic and fatigued Neck: JVP hard to see looks mildly elevated, no thyromegaly or thyroid nodule.  Lungs: Diminished BS bilaterally CV: Nondisplaced PMI.  Heart regular S1/S2, no S3/S4, no murmur.  No peripheral edema.  Abdomen: Soft, nontender, no hepatosplenomegaly, no distention.  Neurologic: Alert and oriented x 3.  Psych: Normal affect. Extremities: No clubbing or cyanosis. Missing tips to all five digits on R hand.   TELEMETRY: Reviewed telemetry pt in NSR with low voltage P  waves  ASSESSMENT AND PLAN: 80 yo with history of CAD s/p CABG, ischemic CMP (EF 30-35% 08/2013), VT s/p ablation with recurrence, COPD, PAF presented after receiving multiple ICD shocks for VT.    1. VT: ICD shocks at home.  He has had RFCA x2.  He did not tolerate amiodarone previously and failed sotalol and mexiletene. During this admission Coreg and sotalol increased.   [name] for ICD generator change yesterday which he tolerated.  - Continue current sotalol, will need to follow QT interval with ECGs - No BP room to increase Coreg further.   2. Acute on chronic systolic CHF: EF 79-15% (08/2013) He does not appear on exam to be markedly volume overloaded, however still has marked SOB. His Cr is stable and he is about 3-4 lbs from baseline weight. Will increase lasix to 80 mg IV TID and and continue to watch Cr.   - SBP soft will not titrate medications currently. Discussed with EP and would like to change from coreg to bisoprolol if ok, will follow up with them.  - Very difficult situation he has been declining for a couple of months now and has not left he house in the past month. Does not appear to have much volume on board and am concerned that he is suffering from end stage HF with low output.  - Would like to have Palliative Care meet with patient and discuss goals of care and hospice. Patient does not want to make decisions on his own and wants to leave to his wife. They will discuss and will follow up with them later as to how they would like to proceed. Have given them the option to assess hemodynamics tomorrow through RHC and being aggressive.  3. COPD: No PNA on CXR Treating for COPD exacerbation. He remains on steroids. Concerned that this may be more lung related and not so much fluid. Will try as above to increase diuretics to see if it helps.   [name] Rud NP-C 08/11/2013 9:47 AM   Patient seen and examined with [name] Potash, NP. We discussed all aspects of the encounter. I  agree with the assessment and plan as stated above.   Difficult situation. Hard to assess volume status on exam but I don't think it is overly elevated. I think the major issue is low output HF. He has declined markedly over the past 2 months and now he is clearly end-stage. I had a long talk with [name] and his wife about their situation, code status and dispostion options including RHC with trial of milrinone (complicated by increased risk of VT) and home or residential Hospice. We went over all the options  in detail and they asked for some time to talk about it.  They then called [place] back to the room and said that [name] would like to be DNR/DNI (with possible deactivation of ICD) and go home TODAY with Hospice, if possible. I told them that we may not be able to coordinate d/c with Hospice today but he was insistent that he would like to go home.   I think he will need aggressive symptom management with morphine and ativan to control his dyspnea.   We will let the primary team know and also consult Hospice.   Total time spent 60 minutes with over 1/2 that time spent discussing above.   Elton Heid,MD 2:25 PM

## 2013-08-11 NOTE — Progress Notes (Signed)
SUBJECTIVE: S/p generator change yesterday with Dr Caryl Comes.  He has developed progressive SOB overnight.  CURRENT MEDICATIONS: . budesonide  0.5 mg Nebulization BID  . carvedilol  6.25 mg Oral BID WC  .  ceFAZolin (ANCEF) IV  1 g Intravenous Q6H  . doxycycline (VIBRAMYCIN) IV  100 mg Intravenous Q12H  . ferrous sulfate  325 mg Oral Q breakfast  . furosemide  60 mg Intravenous Q8H  . levalbuterol  0.63 mg Nebulization Q6H  . megestrol  40 mg Oral Daily  . montelukast  10 mg Oral QHS  . pantoprazole  40 mg Oral Daily  . potassium chloride  40 mEq Oral Daily  . predniSONE  40 mg Oral Q breakfast  . salmeterol  1 puff Inhalation Q12H  . simvastatin  40 mg Oral QHS  . sodium chloride  3 mL Intravenous Q12H  . sotalol  240 mg Oral Q12H  . Warfarin - Pharmacist Dosing Inpatient   Does not apply q1800      OBJECTIVE: Physical Exam: Filed Vitals:   08/10/13 2018 08/10/13 2104 08/11/13 0058 08/11/13 0459  BP:  90/53  110/59  Pulse:  70  72  Temp:  98.3 F (36.8 C)  97.2 F (36.2 C)  TempSrc:  Oral  Axillary  Resp:  18    Height:      Weight:    141 lb 6.4 oz (64.139 kg)  SpO2: 100% 100% 100% 100%    Intake/Output Summary (Last 24 hours) at 08/11/13 6789 Last data filed at 08/11/13 0105  Gross per 24 hour  Intake    150 ml  Output   1575 ml  Net  -1425 ml    Telemetry reveals atrial pacing with intrinsic conduction, PVC's, intermittent afib with RVR (140's)  GEN- The patient is well appearing, alert and oriented x 3 today.   Head- normocephalic, atraumatic Eyes-  Sclera clear, conjunctiva pink Ears- hearing intact Oropharynx- clear Neck- supple, + JVD Lungs- markedly prolonged expiratory phase with decreased air movement.  Few basilar rales Heart- Regular rate and rhythm  GI- soft, NT, ND, + BS Extremities- no clubbing, cyanosis, or edema  LABS: Basic Metabolic Panel:  Recent Labs  08/09/13 0550 08/10/13 0402  NA 129* 137  K 4.2 3.8  CL 90* 93*  CO2 26 30   GLUCOSE 143* 102*  BUN 18 18  CREATININE 0.84 0.82  CALCIUM 9.3 9.1   CBC:  Recent Labs  08/09/13 0550 08/10/13 0402  WBC 10.1 8.6  HGB 10.1* 9.4*  HCT 31.8* 29.7*  MCV 82.6 81.8  PLT 457* 380    RADIOLOGY: Dg Chest Port 1 View 08/09/2013   CLINICAL DATA:  Evaluate airspace disease.  EXAM: PORTABLE CHEST - 1 VIEW  COMPARISON:  08/07/2013  FINDINGS: Stable position of the left cardiac ICD. Patchy interstitial and airspace densities in both lungs, right side greater the left. Suspect right pleural fluid. Right pleural fluid or right basilar densities have slightly progressed. No evidence for a pneumothorax.  IMPRESSION: Increased right basilar densities may reflect enlarging pleural fluid.  Patchy parenchymal densities suggest pulmonary edema.   Electronically Signed   By: Markus Daft M.D.   On: 08/09/2013 08:23   ASSESSMENT AND PLAN:  Active Problems:   VT (ventricular tachycardia)   Pulmonary edema   COPD exacerbation   Ventricular tachycardia  80 yo with history of CAD s/p CABG, ischemic CMP (EF 30-35% 7/13), VT s/p ablation with recurrence, COPD, PAF presented after receiving multiple  ICD shocks for VT.   1. VT:  QT is stable No further VT S/p gen change yesterday  2. CAD stable  3. Acute on chronic systolic CHF: EF 34-19%  I am very concerned about his progressing SOB.  He appears mildly volume overloaded though SOB is out of proportion to this. Continue Lasix to 60 mg IV every 8 hrs  Will ask the advanced heart failure service to see  4. COPD: No PNA on CXR, Exam reveals significant bronchospasm.  I think that this is a major issue for him.  I will ask pulmonary to come back to reassess this am  5. PAF:  Continue warfarin  Prognosis is guarded this am

## 2013-08-11 NOTE — Progress Notes (Signed)
[name] continues to have progressive decline.  This appears to be primarily respiratory failure.  He has been evaluated by both our advanced heart failure services and pulmonary services.  Unfortunately, we have very little hope of clinically improvement at this time.  He is clear in his decision to discharge with hospice.  Given his clinical decline, I think that this is perhaps the best approach.  I had a long discussion with the patient and his family.  Per their wishes he is DNR/DNI and will be discharged at this time with hospice. He is clear that he wants his ICD therapies to remain on.  He is therefore provided a magnet that can be placed over the device should he had further ICD shocks. It has been a privilege to be a part of his medical care.

## 2013-08-11 NOTE — Progress Notes (Signed)
ANTICOAGULATION CONSULT NOTE - Follow Up Consult  Pharmacy Consult for Coumadin  Indication: atrial fibrillation  Allergies  Allergen Reactions  . Amiodarone     Intolerant    Patient Measurements: Height: 5\' 9"  (175.3 cm) Weight: 140 lb 1.6 oz (63.549 kg) (standing scale) IBW/kg (Calculated) : 70.7  Vital Signs: Temp: 97.2 F (36.2 C) (01/06 0459) Temp src: Axillary (01/06 0459) BP: 110/59 mmHg (01/06 0459) Pulse Rate: 72 (01/06 0459)  Labs:  Recent Labs  08/09/13 0550 08/10/13 0402 08/11/13 0500  HGB 10.1* 9.4*  --   HCT 31.8* 29.7*  --   PLT 457* 380  --   LABPROT 24.1* 22.8* 24.4*  INR 2.24* 2.09* 2.28*  CREATININE 0.84 0.82  --     Estimated Creatinine Clearance: 62.4 ml/min (by C-G formula based on Cr of 0.82).  Assessment: 80 y/o male admitted for ICD change on chronic Coumadin for Afib. No bleeding noted, CBC stable. INR is within goal range today, however it appears Coumadin dose was not charted last night.   Goal of Therapy:  INR 2-3 Monitor platelets by anticoagulation protocol: Yes   Plan:  -Coumadin 4 mg PO x1 today -INR daily -Monitor for s/sx bleeding  Uvaldo Rising, BCPS  Clinical Pharmacist Pager [phone]  08/11/2013 1:33 PM

## 2013-08-11 NOTE — Progress Notes (Signed)
Called to assess patient at bedside for complaints of increased work of breathing.  Patient sitting up on side of bed.  Mild increase in exp phase but lungs with good air movement bilaterally, no wheezing, few faint dry crackles posterior.  Does not appear volume overloaded on exam.  No distress on assessment.  Attempted bipap but patient did not tolerate due to nausea.  He has had issues with this in the past as well.    Plan: -would not attempt further bipap -if decompensates, will need intubation -transfer to SDU when bed available -increased frequency of nebs:  Discussed with RT to given PRN doses today as well as scheduled -continue lasix, prednisone, doxycyline -repeat cxr in am  -ativan 0.5mg  IV x1    Noe Gens, NP-C [facility] Pgr: [phone] or [phone]

## 2013-08-11 NOTE — Progress Notes (Signed)
RT Note: Pt called for PRN treatment. Per MD HHN to be given Q3, pt requiring treatments more frequently with little to no change post treatment. Pt still SOB with increased WOB, SpO2 92% on 2L. Discussed with nurse, MD paged, RT to monitor.

## 2013-08-11 NOTE — Progress Notes (Signed)
RT Note: Attempted BIPAP per MD order, pt did not tolerate. MD made aware

## 2013-08-13 ENCOUNTER — Telehealth: Payer: Self-pay | Admitting: Internal Medicine

## 2013-08-13 NOTE — Telephone Encounter (Signed)
New Problem:  [name] is calling to check on the status of some home health forms.. She would like a call back..   Fax [phone] Revonda Humphrey

## 2013-08-13 NOTE — Telephone Encounter (Signed)
Faxed

## 2013-08-24 ENCOUNTER — Encounter: Payer: Medicare Other | Admitting: Cardiovascular Disease

## 2013-09-01 ENCOUNTER — Ambulatory Visit: Payer: Self-pay | Admitting: *Deleted

## 2013-09-01 DIAGNOSIS — I4891 Unspecified atrial fibrillation: Secondary | ICD-10-CM

## 2013-09-01 DIAGNOSIS — Z7901 Long term (current) use of anticoagulants: Secondary | ICD-10-CM

## 2013-09-04 ENCOUNTER — Ambulatory Visit (INDEPENDENT_AMBULATORY_CARE_PROVIDER_SITE_OTHER): Payer: Medicare Other | Admitting: *Deleted

## 2013-09-04 DIAGNOSIS — Z5181 Encounter for therapeutic drug level monitoring: Secondary | ICD-10-CM | POA: Insufficient documentation

## 2013-09-04 DIAGNOSIS — I2589 Other forms of chronic ischemic heart disease: Secondary | ICD-10-CM

## 2013-09-04 DIAGNOSIS — Z7901 Long term (current) use of anticoagulants: Secondary | ICD-10-CM

## 2013-09-04 DIAGNOSIS — I4891 Unspecified atrial fibrillation: Secondary | ICD-10-CM

## 2013-09-04 LAB — POCT INR: INR: 2.2

## 2013-09-16 ENCOUNTER — Encounter: Payer: Self-pay | Admitting: Internal Medicine

## 2013-09-18 ENCOUNTER — Encounter: Payer: Self-pay | Admitting: Internal Medicine

## 2013-09-25 ENCOUNTER — Telehealth: Payer: Self-pay | Admitting: *Deleted

## 2013-09-25 NOTE — Telephone Encounter (Signed)
[name] with Hospice calling about patient this morning, on Sotalol 120mg  - 2 tabs BID   BP - 102/50  HR 72    [name] concerned that dose may need to be adjusted.  Dizziness with standing though, but is doing better than he was when first came to their services.  Is willing to come in for post hospital visit.  Patient had initially cancelled eph scheduled for 1/19 due to being in hospice.  He has been to office recently on 1/30 for INR check with Lattie Haw.

## 2013-09-27 NOTE — Telephone Encounter (Signed)
DO NOT decrease sotalol.  Give his VT history, he needs it.  His sotalol is not the cause for his low blood pressure which is a very chronic issue. I am happy to see him for follow-up.

## 2013-09-28 NOTE — Telephone Encounter (Signed)
Wife notified.  Trying to get appointment in [place] as Dr. Rayann Heman is not here in [place] till 3/11 but schedule is full.  Awaiting call back from [facility].

## 2013-09-28 NOTE — Telephone Encounter (Signed)
Received message on voice mail - Per [name], she has called and spoke with [name].  Have scheduled him for 3/9 there in [place] office.  Stated that she informed her to call office if he has any problems before then.

## 2013-09-29 ENCOUNTER — Telehealth: Payer: Self-pay | Admitting: *Deleted

## 2013-09-29 NOTE — Telephone Encounter (Signed)
Appointment scheduled for 2-25 @ 1:30pm for ICD shock on 2-23. Wife voiced understanding.

## 2013-09-30 ENCOUNTER — Ambulatory Visit (INDEPENDENT_AMBULATORY_CARE_PROVIDER_SITE_OTHER): Admitting: Internal Medicine

## 2013-09-30 ENCOUNTER — Encounter: Payer: Self-pay | Admitting: Internal Medicine

## 2013-09-30 VITALS — BP 118/82 | HR 72 | Ht 69.0 in | Wt 133.0 lb

## 2013-09-30 DIAGNOSIS — I2589 Other forms of chronic ischemic heart disease: Secondary | ICD-10-CM

## 2013-09-30 DIAGNOSIS — I2510 Atherosclerotic heart disease of native coronary artery without angina pectoris: Secondary | ICD-10-CM

## 2013-09-30 DIAGNOSIS — I472 Ventricular tachycardia, unspecified: Secondary | ICD-10-CM

## 2013-09-30 DIAGNOSIS — I4891 Unspecified atrial fibrillation: Secondary | ICD-10-CM

## 2013-09-30 DIAGNOSIS — I4729 Other ventricular tachycardia: Secondary | ICD-10-CM

## 2013-09-30 LAB — BASIC METABOLIC PANEL
BUN: 19 mg/dL (ref 6–23)
CO2: 29 mEq/L (ref 19–32)
Calcium: 9.6 mg/dL (ref 8.4–10.5)
Chloride: 99 mEq/L (ref 96–112)
Creatinine, Ser: 0.8 mg/dL (ref 0.4–1.5)
GFR: 98.31 mL/min (ref >60.00)
Glucose, Bld: 95 mg/dL (ref 70–99)
Potassium: 3.5 mEq/L (ref 3.5–5.1)
SODIUM: 136 meq/L (ref 135–145)

## 2013-09-30 LAB — CBC WITH DIFFERENTIAL/PLATELET
BASOS ABS: 0 10*3/uL (ref 0.0–0.1)
BASOS PCT: 0.2 % (ref 0.0–3.0)
Eosinophils Absolute: 0 10*3/uL (ref 0.0–0.7)
Eosinophils Relative: 0.1 % (ref 0.0–5.0)
HCT: 36.7 % — ABNORMAL LOW (ref 39.0–52.0)
HEMOGLOBIN: 11.8 g/dL — AB (ref 13.0–17.0)
LYMPHS PCT: 9.2 % — AB (ref 12.0–46.0)
Lymphs Abs: 0.7 10*3/uL (ref 0.7–4.0)
MCHC: 32 g/dL (ref 30.0–36.0)
MCV: 84.4 fl (ref 78.0–100.0)
MONOS PCT: 6.1 % (ref 3.0–12.0)
Monocytes Absolute: 0.5 10*3/uL (ref 0.1–1.0)
NEUTROS ABS: 6.7 10*3/uL (ref 1.4–7.7)
Neutrophils Relative %: 84.4 % — ABNORMAL HIGH (ref 43.0–77.0)
Platelets: 272 10*3/uL (ref 150.0–400.0)
RBC: 4.34 Mil/uL (ref 4.22–5.81)
RDW: 19 % — ABNORMAL HIGH (ref 11.5–14.6)
WBC: 7.9 10*3/uL (ref 4.5–10.5)

## 2013-09-30 LAB — MDC_IDC_ENUM_SESS_TYPE_INCLINIC
Brady Statistic RA Percent Paced: 74 %
Brady Statistic RV Percent Paced: 6 %
HIGH POWER IMPEDANCE MEASURED VALUE: 52 Ohm
Lead Channel Impedance Value: 350 Ohm
Lead Channel Impedance Value: 912.5 Ohm
Lead Channel Pacing Threshold Amplitude: 0.75 V
Lead Channel Pacing Threshold Amplitude: 1.625 V
Lead Channel Pacing Threshold Pulse Width: 0.5 ms
Lead Channel Setting Pacing Amplitude: 1.75 V
Lead Channel Setting Pacing Amplitude: 1.875
Lead Channel Setting Pacing Pulse Width: 0.6 ms
MDC IDC MSMT BATTERY REMAINING LONGEVITY: 75.6 mo
MDC IDC MSMT LEADCHNL RA SENSING INTR AMPL: 2.2 mV
MDC IDC MSMT LEADCHNL RV PACING THRESHOLD PULSEWIDTH: 0.6 ms
MDC IDC MSMT LEADCHNL RV SENSING INTR AMPL: 11.7 mV
MDC IDC PG SERIAL: 7145416
MDC IDC SESS DTM: 20150225145801
MDC IDC SET LEADCHNL RV SENSING SENSITIVITY: 0.5 mV
MDC IDC SET ZONE DETECTION INTERVAL: 300 ms
Zone Setting Detection Interval: 250 ms
Zone Setting Detection Interval: 425 ms

## 2013-09-30 LAB — MAGNESIUM: Magnesium: 2.2 mg/dL (ref 1.5–2.5)

## 2013-09-30 MED ORDER — SOTALOL HCL 120 MG PO TABS: 120.0000 mg | ORAL_TABLET | Freq: Two times a day (BID) | ORAL | Status: AC

## 2013-09-30 MED ORDER — SOTALOL HCL 160 MG PO TABS: 320.0000 mg | ORAL_TABLET | Freq: Two times a day (BID) | ORAL | Status: AC

## 2013-09-30 NOTE — Progress Notes (Signed)
PCP:  Deloria Lair, MD Primary Cardiologist:  Dr Joycelyn Das  The patient presents today for electrophysiology followup.  He is at home with hospice.  The last few months have been very difficult for him.  His breathing has actually improved since he recently left the hospital.  He is very weak and his muscle mass is decreased substantially.  He continues to have recurrent VT and has had several ICD shocks recently.  Today, he denies symptoms of chest pain, orthopnea, PND, lower extremity edema, or neurologic sequela.  The patient feels that he is tolerating medications without difficulties and is otherwise without complaint today.   Past Medical History  Diagnosis Date  . Hypertension   . Coronary artery disease     a. CABG 03/2006. b. NSTEMI 03/2012 following VT likely demand ischemia - grafts patent at cath.  . Diabetes mellitus   . Ischemic cardiomyopathy     a. With chronic systolic CHF.  [name] Syncope and collapse   . Atrial fibrillation     a. Taking sotalol - intolerant to amiodarone/mexilitene. b. On Coumadin.  . Valvular heart disease     Mild to moderate mitral valve insufficiency and mild aortic insufficiency  . Chronic airway obstruction,steroid dependent   . Other and unspecified hyperlipidemia   . Chronic systolic heart failure     a. Ischemic cardiomyopathy ejection fraction 30-35% previously. b. Down to 15% by cath 03/2012.  [name] Cerebrovascular disease, unspecified   . Other mechanical complication of other internal orthopedic device, implant, and graft   . Carotid artery disease     s/p LCEA 2007.  [name] Head and neck cancer     S/P radiation therapy. "cancer free"  . Orthostatic hypotension   . Ventricular tachycardia     a. S/p ICD implantation. b. Recurrent in 03/2012 with medication adjustment. c. s/p RFCA 11/2012 (Dr. Greggory Brandy) ; d. Recent adm 06/2013 for VT/ICD shock after failing med rx with sotalol, mexiletine and metoprolol - repeat ablation 06-23-2013 by Dr Rayann Heman.   . Anemia     a. 06/2013 notes: "Given weight loss and iron deficiency, outpatient colonoscopy should be considered once more clinically stable."  . Pneumonia     a. Qualified for home O2 with ambulation during 06/2013 adm.  . Weight loss    Past Surgical History  Procedure Laterality Date  . Repeat otif left knee with figure-of-eight tension band.    . Implantation of a dual-chamber implantable  06/18/2007  . Left carotid endarterectomy    . Coronary artery bypass graft  04/04/2006  . Vt ablation  11/06/12; 06-23-2013    Ischemic VT ablation by Dr Rayann Heman; repeat 06-23-2013 by Dr Rayann Heman    Current Outpatient Prescriptions  Medication Sig Dispense Refill  . albuterol (PROVENTIL HFA;VENTOLIN HFA) 108 (90 BASE) MCG/ACT inhaler Inhale 2 puffs into the lungs every 6 (six) hours as needed for wheezing or shortness of breath.      . ALPRAZolam (XANAX) 0.25 MG tablet Take 0.25-0.5 mg by mouth 4 (four) times daily as needed for anxiety.      . budesonide (PULMICORT) 0.5 MG/2ML nebulizer solution Take 0.5 mg by nebulization 2 (two) times daily.      . carvedilol (COREG) 3.125 MG tablet Take 1 tablet (3.125 mg total) by mouth 2 (two) times daily with a meal.  60 tablet  11  . doxycycline (VIBRA-TABS) 100 MG tablet Take 1 tablet (100 mg total) by mouth 2 (two) times daily.  14 tablet  0  .  ferrous sulfate 325 (65 FE) MG tablet Take 325 mg by mouth daily with breakfast.      . formoterol (PERFOROMIST) 20 MCG/2ML nebulizer solution Take 20 mcg by nebulization 2 (two) times daily.      . furosemide (LASIX) 80 MG tablet Take 1 tablet (80 mg total) by mouth 2 (two) times daily.  60 tablet  3  . LORazepam (ATIVAN) 1 MG tablet Take 1 tablet (1 mg total) by mouth every 6 (six) hours as needed for anxiety.  30 tablet  0  . montelukast (SINGULAIR) 10 MG tablet Take 10 mg by mouth at bedtime.       . Morphine Sulfate (MORPHINE CONCENTRATE) 10 mg / 0.5 ml concentrated solution Take 0.25-0.5 mLs (5-10 mg total) by  mouth every 2 (two) hours as needed for severe pain.  30 mL  0  . nitroGLYCERIN (NITROSTAT) 0.4 MG SL tablet Place 1 tablet (0.4 mg total) under the tongue every 5 (five) minutes as needed for chest pain.  25 each  3  . omeprazole (PRILOSEC) 40 MG capsule Take 40 mg by mouth daily.      . potassium chloride 20 MEQ TBCR Take 40 mEq by mouth daily.  60 tablet  3  . predniSONE (DELTASONE) 5 MG tablet Take 0.5 tablets (2.5 mg total) by mouth daily. Continue after prednisone taper.      . simvastatin (ZOCOR) 40 MG tablet Take 40 mg by mouth at bedtime.       [name] warfarin (COUMADIN) 5 MG tablet Take 2.5 mg daily except take 5 mg on Wednesday.      . sotalol (BETAPACE) 120 MG tablet Take 1 tablet (120 mg total) by mouth 2 (two) times daily.  60 tablet  11  . sotalol (BETAPACE) 160 MG tablet Take 2 tablets (320 mg total) by mouth 2 (two) times daily.  60 tablet  11   No current facility-administered medications for this visit.    Allergies  Allergen Reactions  . Amiodarone     Intolerant    History   Social History  . Marital Status: Married    Spouse Name: N/A    Number of Children: N/A  . Years of Education: N/A   Occupational History  . RETIRED    Social History Main Topics  . Smoking status: Former Smoker -- 2.00 packs/day for 60 years    Types: Cigarettes    Quit date: 08/06/1990  . Smokeless tobacco: Never Used  . Alcohol Use: No  . Drug Use: No  . Sexual Activity: Yes   Other Topics Concern  . Not on file   Social History Narrative   Pt does not get regular exercise.    Physical Exam: Filed Vitals:   09/30/13 1336  BP: 118/82  Pulse: 72  Height: 5\' 9"  (1.753 m)  Weight: 133 lb (60.328 kg)    GEN- The patient is thin and frail appearing, alert and oriented x 3 today.   Head- normocephalic, atraumatic Eyes-  Sclera clear, conjunctiva pink Ears- hearing intact Oropharynx- clear Neck- supple, no JVP Lungs- Clear to ausculation bilaterally, normal work of  breathing Heart- Regular rate and rhythm, no murmurs, rubs or gallops, PMI not laterally displaced GI- soft, NT, ND, + BS Extremities- no clubbing, cyanosis, or edema MS- muscle mass is noticeably decreased Neuro- no focal findings  ICD interrogation today is reviewed  Assessment and Plan:  1. VT Doing well since his VT ablation Continues to have VT Normal ICD function  I have increased NIDs in the VT zone to try to minimize shock therapy I will increase sotalol to 280mg  BID.  I will see him again in 2-3 weeks.  IF he has further VT, I will increase sotalol to 320mg  BID as long as his QT is stable.  BMET, Mg, and TFTs today. No driving  2. afib Stable Continue coumadin  3. Weakness Adequate PO intake encouraged He wants to start home exercises.  I told him that this was ok  4. Chronic systolic dysfunction He is euvolemic on exam today He has occasional postural dizziness.  Consider decreasing lasix upon return  Return to see me the next time that I am in [place]

## 2013-09-30 NOTE — Patient Instructions (Addendum)
Your physician recommends that you schedule a follow-up appointment in: 10/14/13 with Dr Rayann Heman in [place] at [place] has recommended you make the following change in your medication:  1) Increase Sotalol to 280mg  twice daily  Take 1 160mg  tablet and 1 120mg  tablet twice daily   Your physician recommends that you return for lab work today: bmp/cbc/mg

## 2013-10-02 ENCOUNTER — Ambulatory Visit: Payer: Self-pay | Admitting: *Deleted

## 2013-10-02 DIAGNOSIS — I2589 Other forms of chronic ischemic heart disease: Secondary | ICD-10-CM

## 2013-10-02 DIAGNOSIS — Z5181 Encounter for therapeutic drug level monitoring: Secondary | ICD-10-CM

## 2013-10-02 DIAGNOSIS — I4891 Unspecified atrial fibrillation: Secondary | ICD-10-CM

## 2013-10-04 DEATH — deceased

## 2013-10-12 ENCOUNTER — Encounter: Payer: Self-pay | Admitting: Internal Medicine

## 2013-10-14 ENCOUNTER — Encounter: Payer: Self-pay | Admitting: Internal Medicine

## 2013-10-27 ENCOUNTER — Encounter: Payer: Self-pay | Admitting: *Deleted

## 2013-11-02 ENCOUNTER — Telehealth: Payer: Self-pay | Admitting: Internal Medicine

## 2013-11-02 NOTE — Telephone Encounter (Signed)
° °  Ms. Farnan called in and patient has passed away.

## 2013-12-22 ENCOUNTER — Telehealth: Payer: Self-pay

## 2013-12-22 NOTE — Telephone Encounter (Signed)
Patient past away @ Home per obituary

## 2014-07-15 ENCOUNTER — Encounter (HOSPITAL_COMMUNITY): Payer: Self-pay | Admitting: Cardiology

## 2014-12-13 IMAGING — CR DG CHEST 2V
3 series · 3 of 3 positions shown · non-contrast
Comparison: 07/03/2013.

CLINICAL DATA: Pneumonia.

EXAM:
CHEST  2 VIEW

[w chest lat]
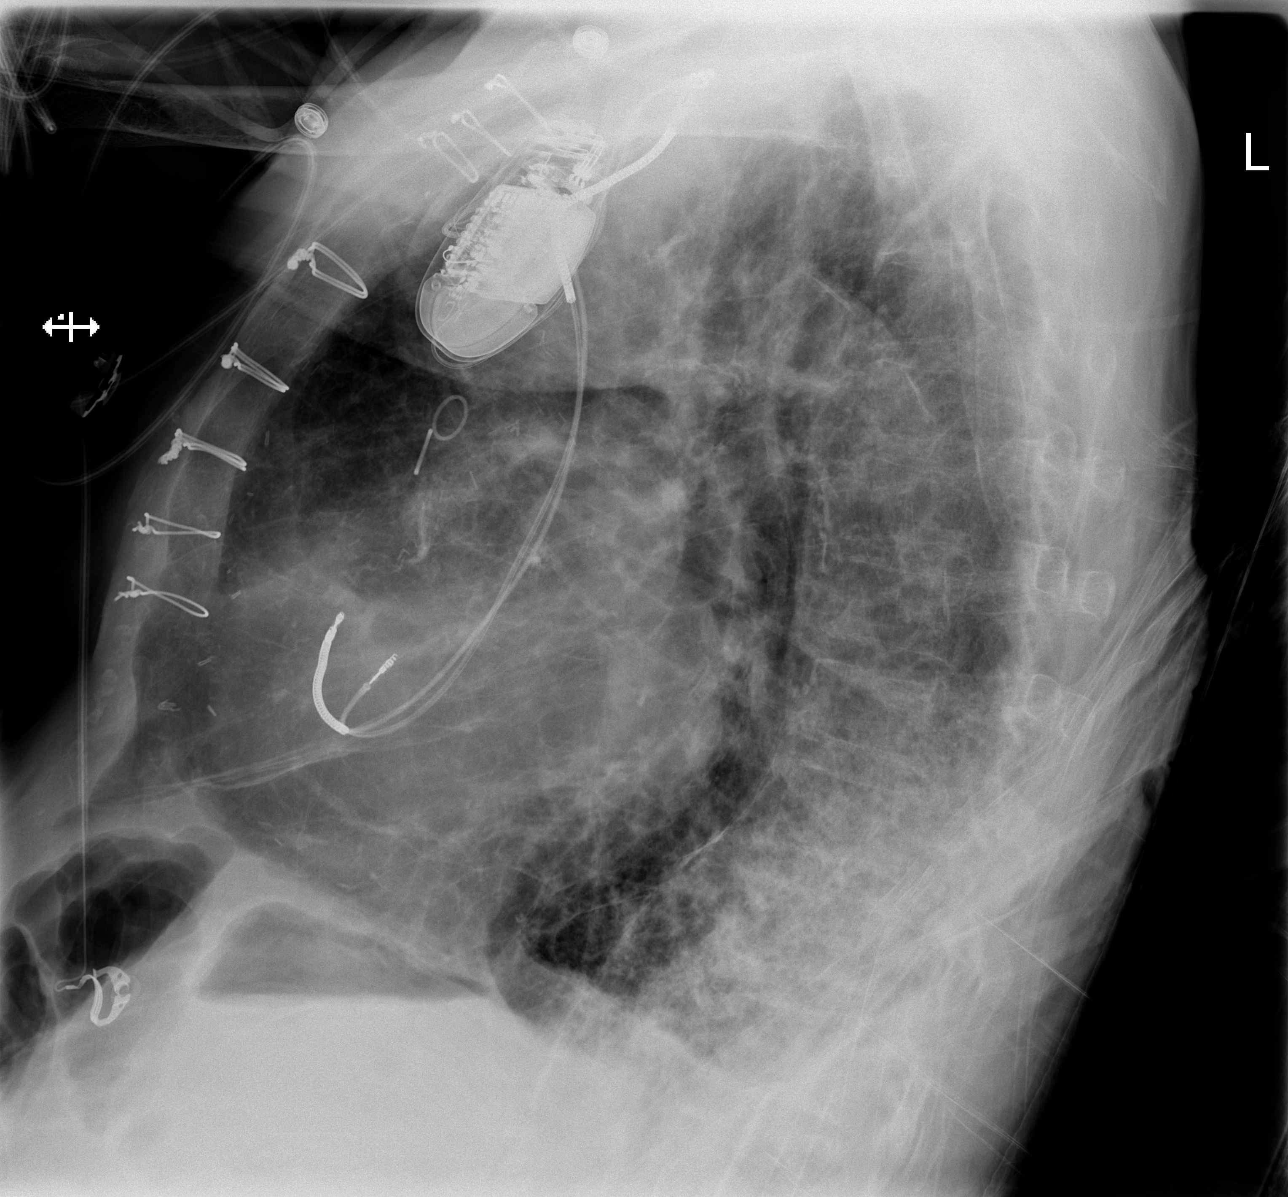

[x chest ap (1 of 2)]
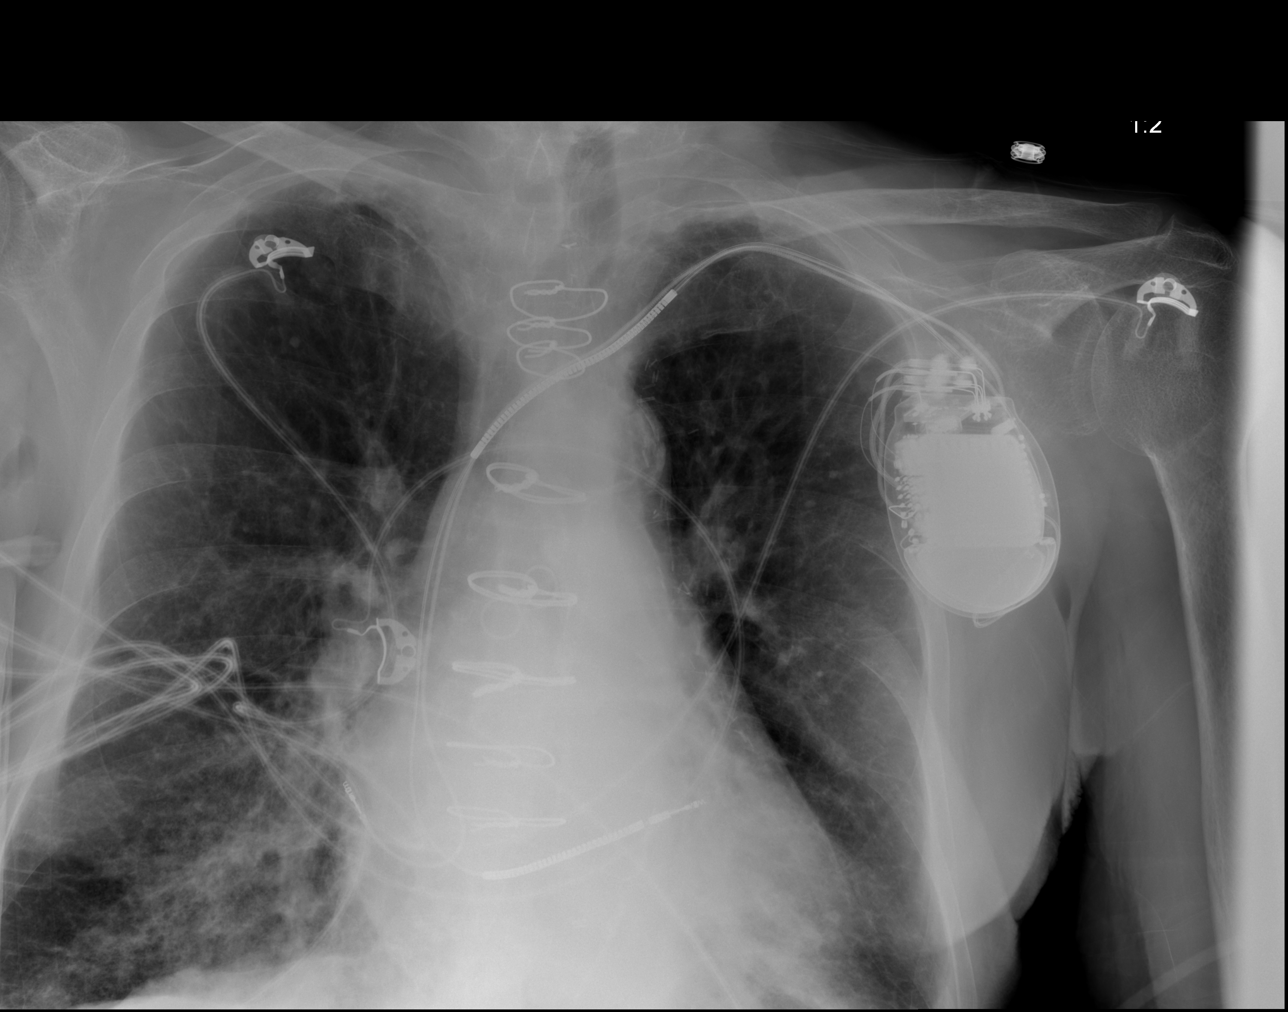

[x chest ap (2 of 2)]
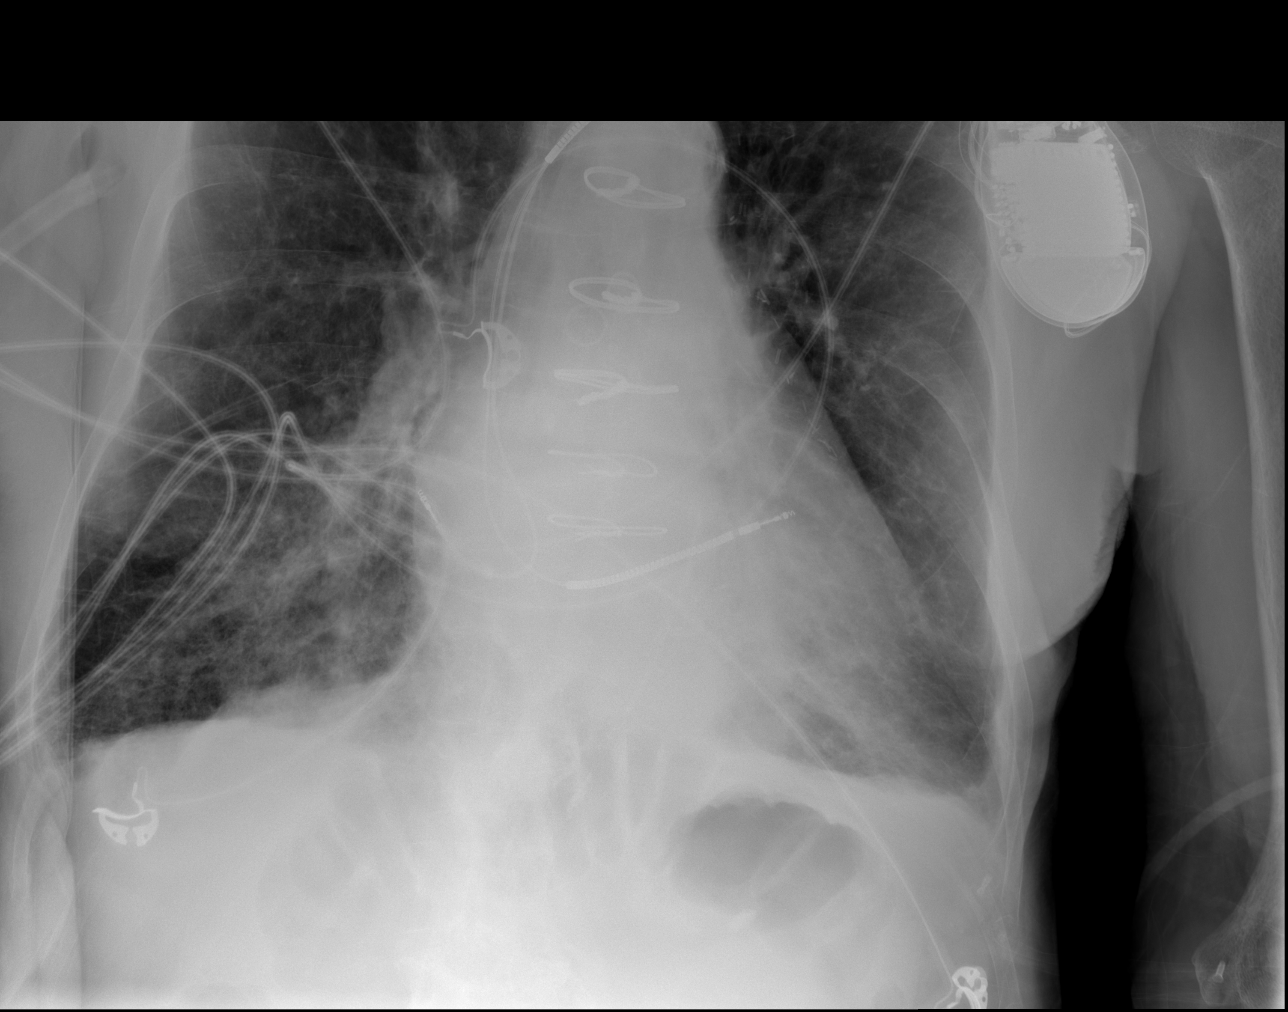

[3 of 3 positions shown; findings below may reference images not displayed]

FINDINGS: Prior CABG. Cardiac pacer. No pulmonary venous congestion. Partial
clearing of basilar pulmonary infiltrates. Persistent Kerley B lines
and tiny pleural effusions noted. These findings suggest partial
clearing of congestive heart failure. Superimposed basilar pneumonia
cannot be excluded, particularly in the right lower lobe. Continued
close follow-up chest x-rays recommended to demonstrate clearing.
IMPRESSION: 1. Findings suggesting partial clearing of congestive heart failure
and pulmonary edema.
2. Superimposed pneumonia particularly in the right lung base cannot
be excluded and continued close follow-up chest x-ray suggested.

## 2014-12-14 IMAGING — CR DG CHEST 1V PORT
1 series · 1 of 1 positions shown · non-contrast
Comparison: the previous day's study

CLINICAL DATA: Airspace disease

EXAM:
PORTABLE CHEST - 1 VIEW

[AP]
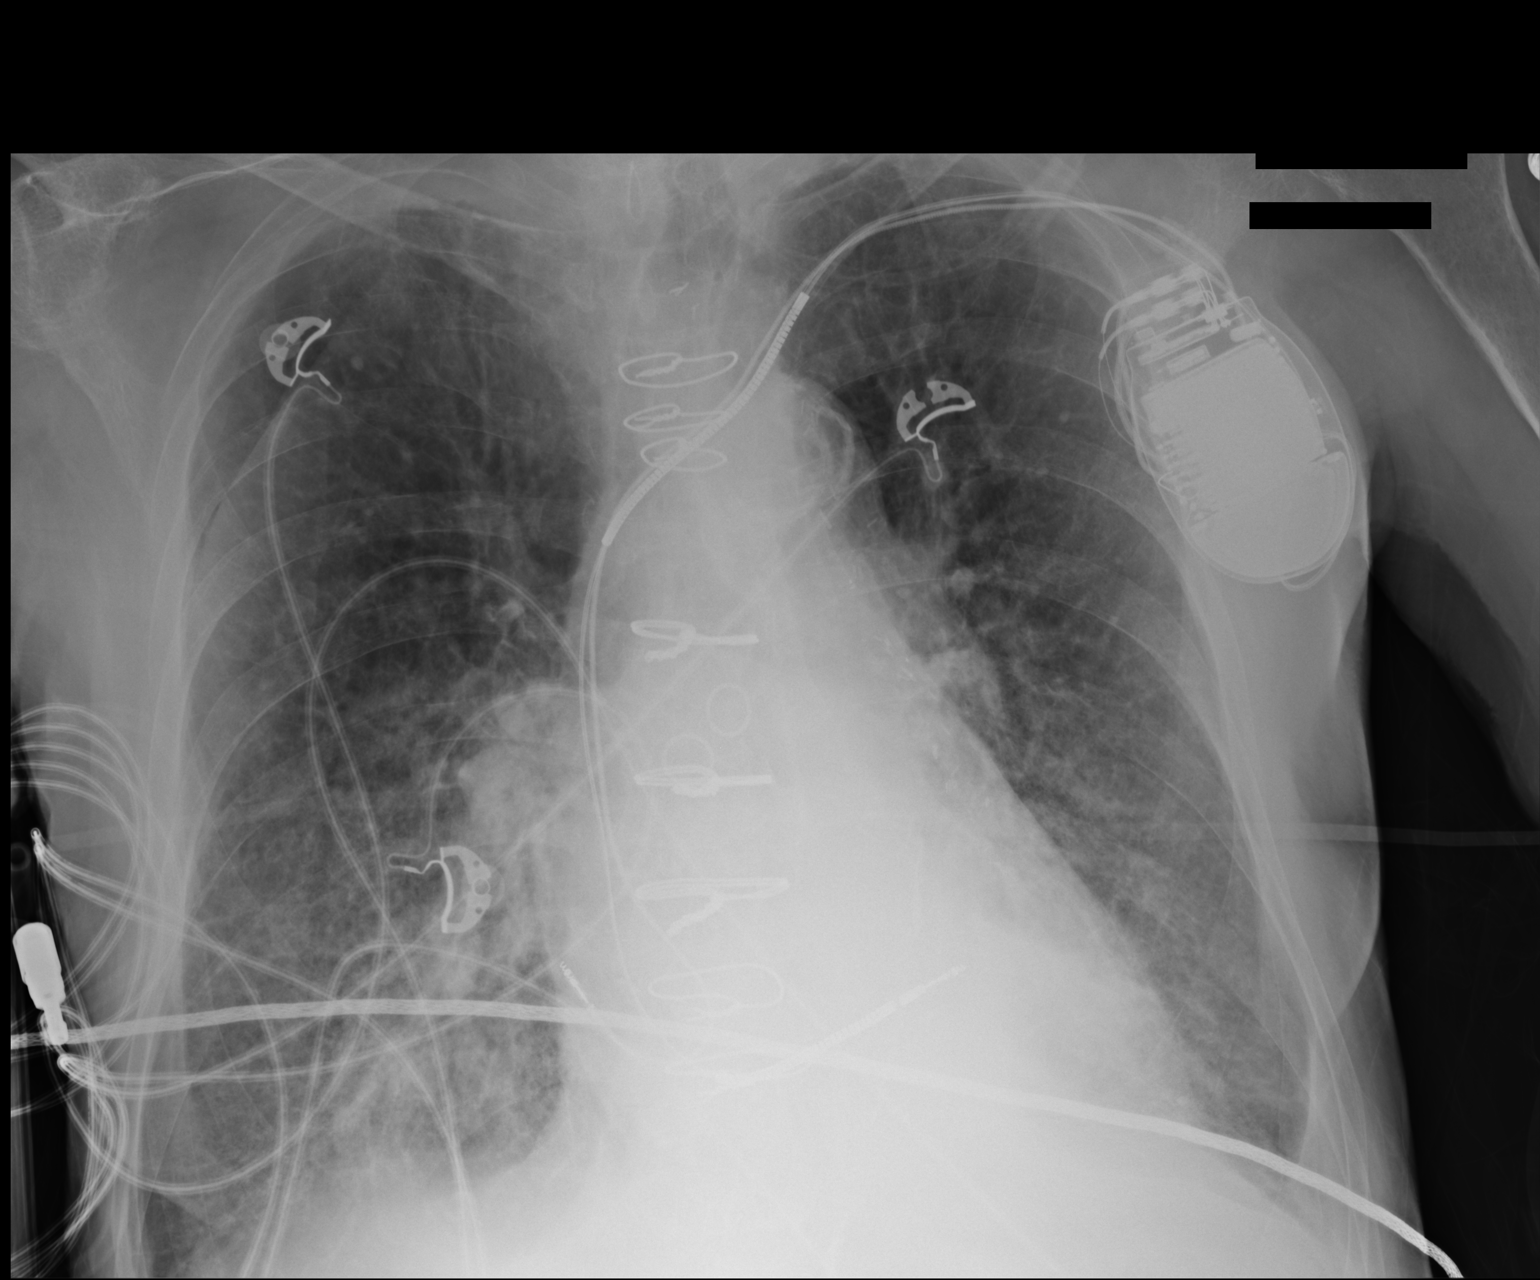

[1 of 1 positions shown; findings below may reference images not displayed]

FINDINGS: Previous CABG. Left subclavian AICD stable. Some worsening of
perihilar and bibasilar alveolar opacities. Suspect left pleural
effusion. The right costophrenic angle is excluded. Heart size upper
limits normal. Atheromatous aorta.
IMPRESSION: 1.  Interval worsening of bilateral edema/ infiltrates.

## 2014-12-16 IMAGING — CR DG CHEST 1V PORT
2 series · 2 of 2 positions shown · non-contrast
Comparison: Portable exam 9889 hr compared to 07/05/2013

CLINICAL DATA: Congestion, pneumonia, history hypertension,
diabetes, coronary artery disease, ischemic cardiomyopathy,
head/neck cancer, CHF, former smoker

EXAM:
PORTABLE CHEST - 1 VIEW

[AP (1 of 2)]
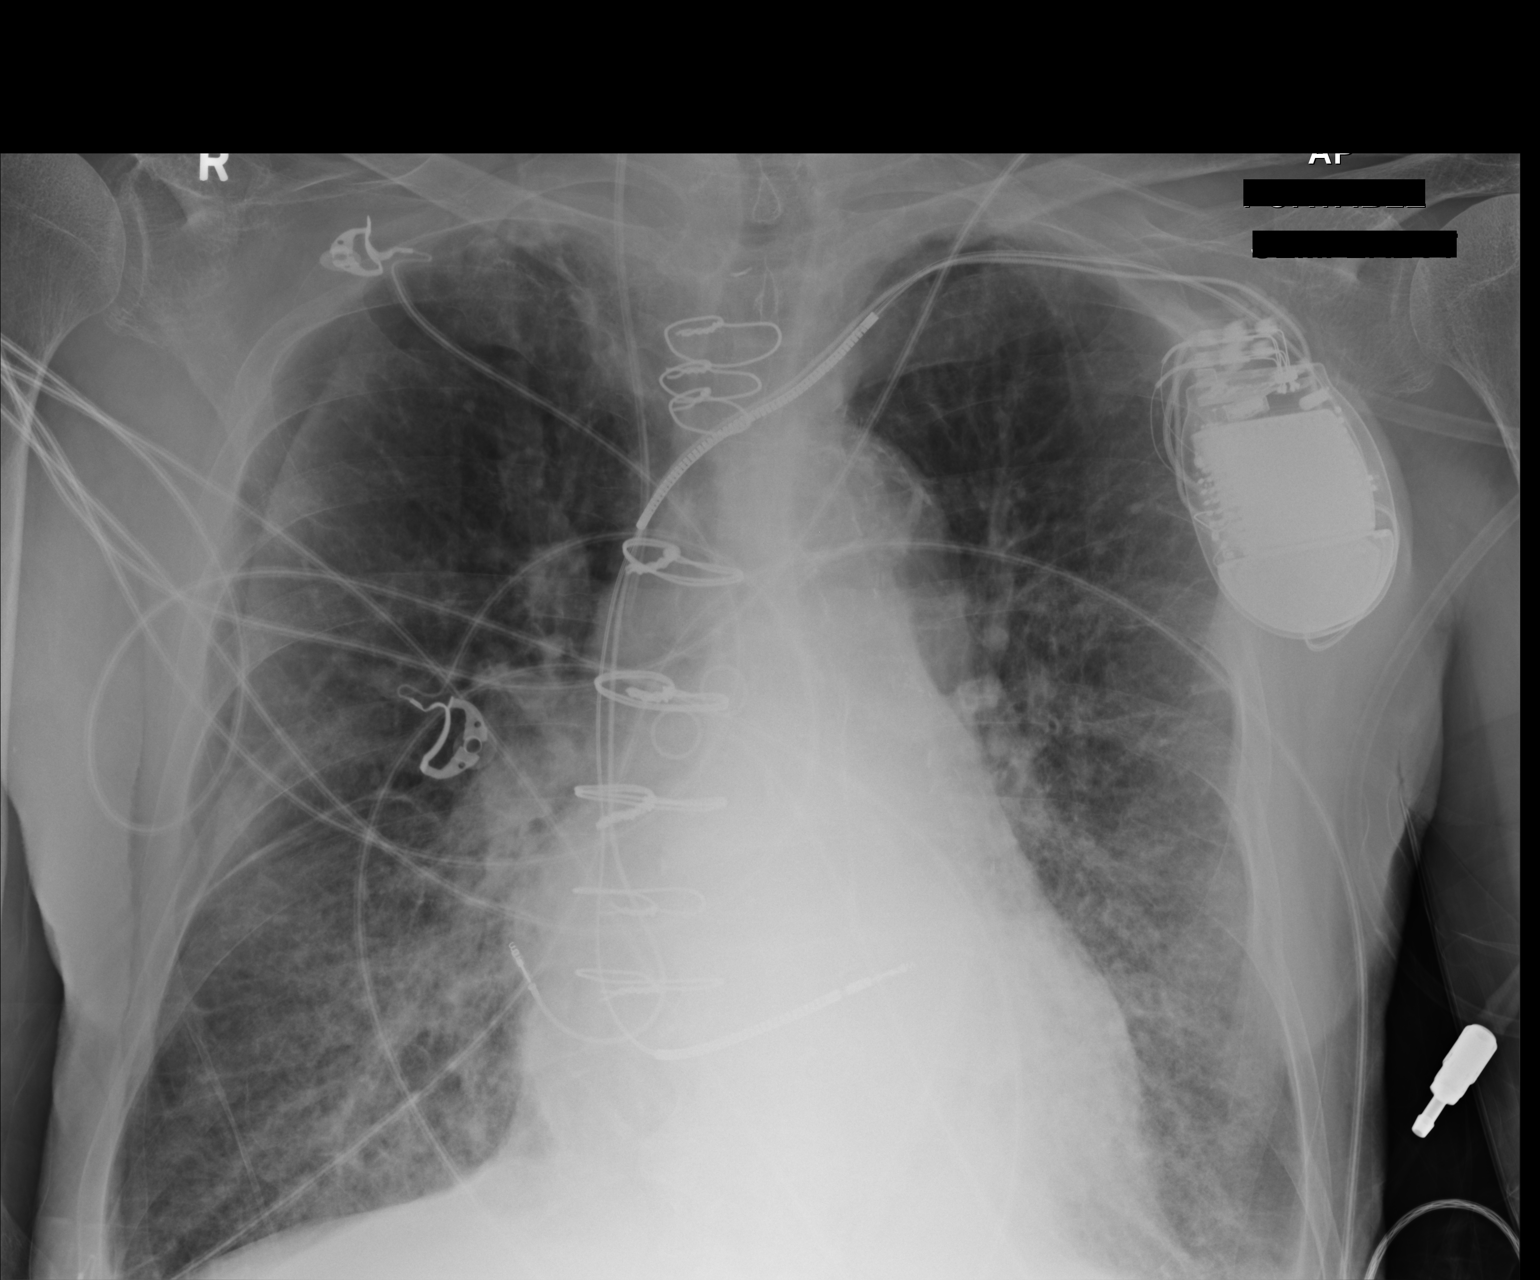

[AP (2 of 2)]
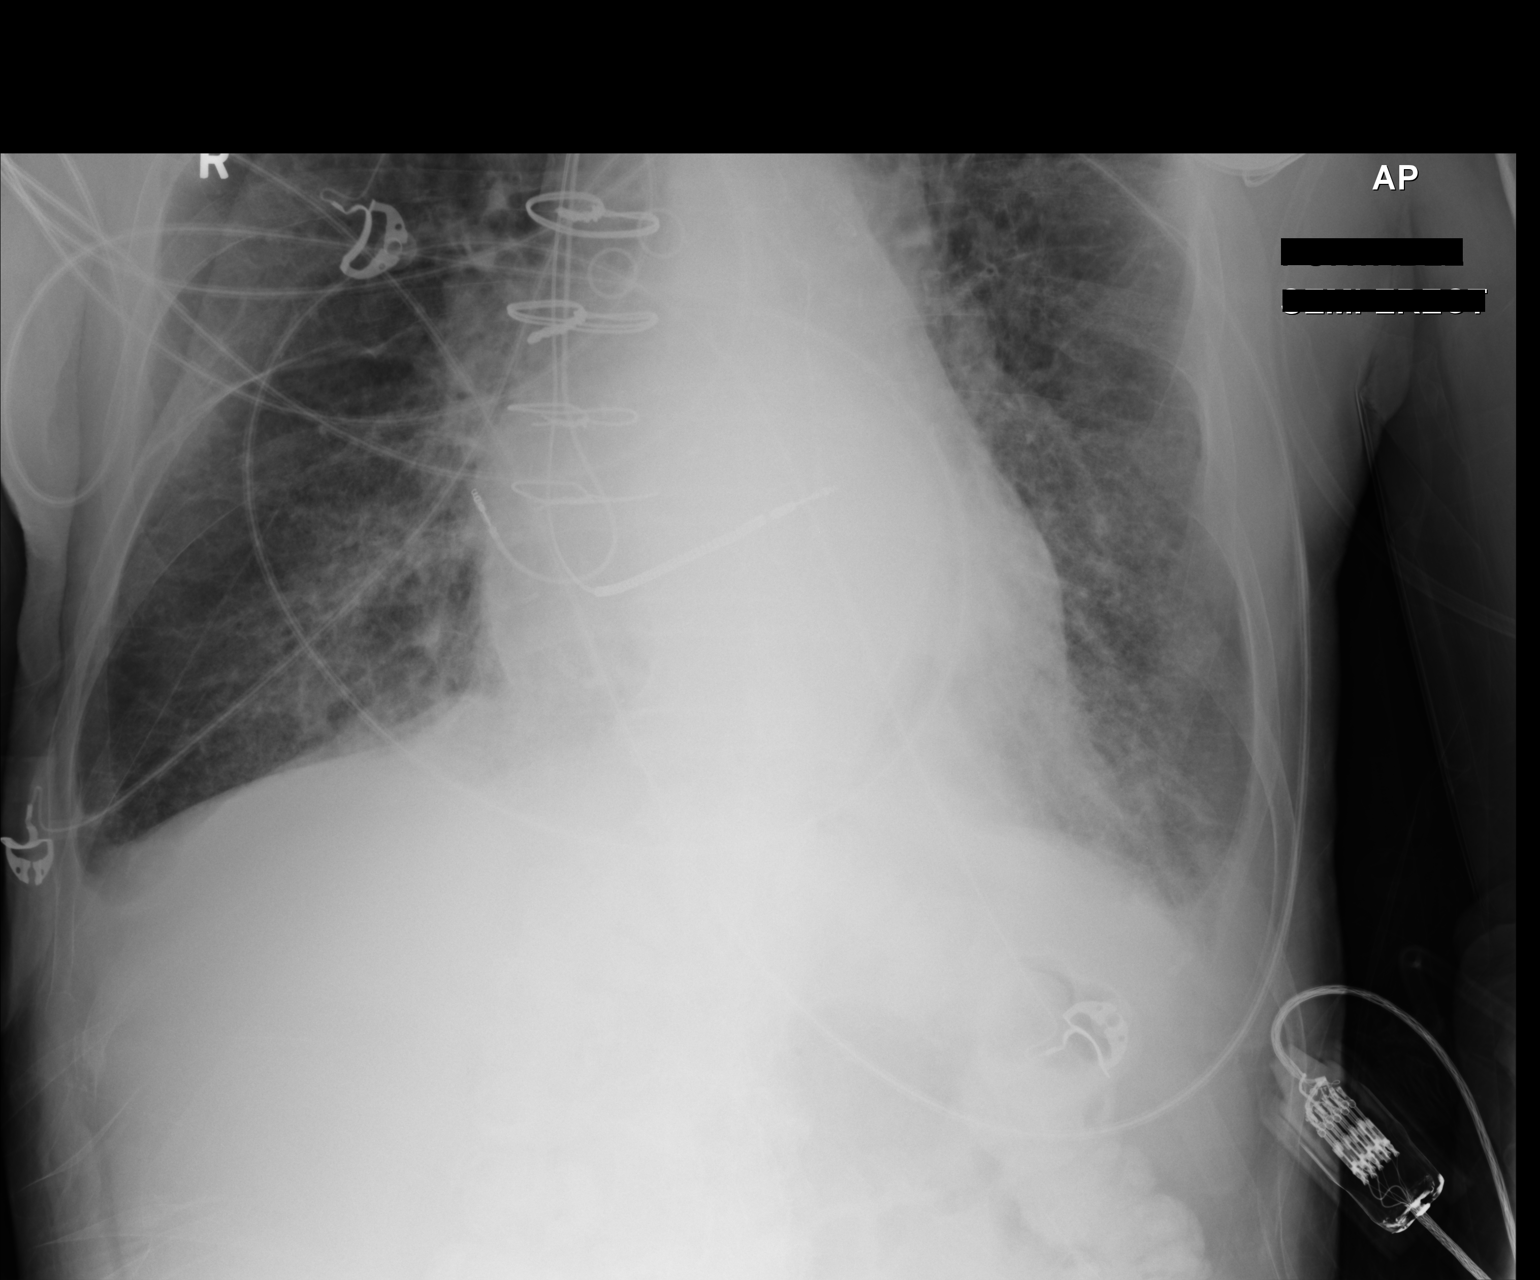

[2 of 2 positions shown; findings below may reference images not displayed]

FINDINGS: Right jugular line with tip projecting over proximal SVC.

Left subclavian AICD leads project over right atrium and right
ventricle.

Upper normal heart size post CABG.

Calcified tortuous thoracic aorta.

Skin fold projects over lateral upper right chest.

Bilateral pulmonary infiltrates greatest at the lower lobes.

Underlying COPD/chronic bronchitic changes.

Question tiny left pleural effusion.

No definite pneumothorax.

Bones demineralized.
IMPRESSION: COPD changes with persistent bilateral pulmonary infiltrates
greatest at lung bases.
